# Patient Record
Sex: Female | Born: 1940 | Race: Black or African American | Hispanic: No | Smoking: Never smoker
Health system: Southern US, Community
[De-identification: ages and names within clinical notes are randomized; demographics above are authoritative.]

## PROBLEM LIST (undated history)

## (undated) DIAGNOSIS — D649 Anemia, unspecified: Secondary | ICD-10-CM

## (undated) DIAGNOSIS — I10 Essential (primary) hypertension: Secondary | ICD-10-CM

## (undated) DIAGNOSIS — E785 Hyperlipidemia, unspecified: Secondary | ICD-10-CM

## (undated) DIAGNOSIS — J189 Pneumonia, unspecified organism: Secondary | ICD-10-CM

## (undated) DIAGNOSIS — N289 Disorder of kidney and ureter, unspecified: Secondary | ICD-10-CM

## (undated) DIAGNOSIS — I509 Heart failure, unspecified: Secondary | ICD-10-CM

## (undated) DIAGNOSIS — E039 Hypothyroidism, unspecified: Secondary | ICD-10-CM

## (undated) DIAGNOSIS — F32A Depression, unspecified: Secondary | ICD-10-CM

## (undated) DIAGNOSIS — I2510 Atherosclerotic heart disease of native coronary artery without angina pectoris: Secondary | ICD-10-CM

## (undated) DIAGNOSIS — I4891 Unspecified atrial fibrillation: Secondary | ICD-10-CM

## (undated) DIAGNOSIS — I639 Cerebral infarction, unspecified: Secondary | ICD-10-CM

## (undated) DIAGNOSIS — K219 Gastro-esophageal reflux disease without esophagitis: Secondary | ICD-10-CM

## (undated) DIAGNOSIS — F329 Major depressive disorder, single episode, unspecified: Secondary | ICD-10-CM

## (undated) HISTORY — PX: ABDOMINAL HYSTERECTOMY: SHX81

## (undated) HISTORY — DX: Unspecified atrial fibrillation: I48.91

---

## 2004-08-11 ENCOUNTER — Observation Stay (HOSPITAL_COMMUNITY): Admission: EM | Admit: 2004-08-11 | Discharge: 2004-08-12 | Payer: Self-pay | Admitting: *Deleted

## 2004-08-23 ENCOUNTER — Ambulatory Visit (HOSPITAL_COMMUNITY): Admission: RE | Admit: 2004-08-23 | Discharge: 2004-08-23 | Payer: Self-pay | Admitting: Cardiology

## 2010-12-15 ENCOUNTER — Encounter: Payer: Self-pay | Admitting: *Deleted

## 2010-12-15 ENCOUNTER — Inpatient Hospital Stay (HOSPITAL_COMMUNITY)
Admission: EM | Admit: 2010-12-15 | Discharge: 2010-12-18 | DRG: 194 | Disposition: A | Discharge status: Home / Self Care | Payer: Medicare Other | Attending: Internal Medicine | Admitting: Internal Medicine

## 2010-12-15 ENCOUNTER — Emergency Department (HOSPITAL_COMMUNITY): Payer: Medicare Other

## 2010-12-15 DIAGNOSIS — J189 Pneumonia, unspecified organism: Secondary | ICD-10-CM

## 2010-12-15 DIAGNOSIS — E039 Hypothyroidism, unspecified: Secondary | ICD-10-CM

## 2010-12-15 DIAGNOSIS — R0902 Hypoxemia: Secondary | ICD-10-CM

## 2010-12-15 DIAGNOSIS — I10 Essential (primary) hypertension: Secondary | ICD-10-CM | POA: Diagnosis present

## 2010-12-15 DIAGNOSIS — N179 Acute kidney failure, unspecified: Secondary | ICD-10-CM | POA: Diagnosis present

## 2010-12-15 DIAGNOSIS — D649 Anemia, unspecified: Secondary | ICD-10-CM | POA: Diagnosis present

## 2010-12-15 DIAGNOSIS — E1121 Type 2 diabetes mellitus with diabetic nephropathy: Secondary | ICD-10-CM | POA: Diagnosis present

## 2010-12-15 DIAGNOSIS — E119 Type 2 diabetes mellitus without complications: Secondary | ICD-10-CM

## 2010-12-15 DIAGNOSIS — E785 Hyperlipidemia, unspecified: Secondary | ICD-10-CM

## 2010-12-15 DIAGNOSIS — J159 Unspecified bacterial pneumonia: Secondary | ICD-10-CM

## 2010-12-15 DIAGNOSIS — R82998 Other abnormal findings in urine: Secondary | ICD-10-CM | POA: Diagnosis present

## 2010-12-15 HISTORY — DX: Essential (primary) hypertension: I10

## 2010-12-15 HISTORY — DX: Hyperlipidemia, unspecified: E78.5

## 2010-12-15 HISTORY — DX: Depression, unspecified: F32.A

## 2010-12-15 HISTORY — DX: Hypothyroidism, unspecified: E03.9

## 2010-12-15 HISTORY — DX: Anemia, unspecified: D64.9

## 2010-12-15 HISTORY — DX: Major depressive disorder, single episode, unspecified: F32.9

## 2010-12-15 LAB — URINE MICROSCOPIC-ADD ON

## 2010-12-15 LAB — DIFFERENTIAL
Eosinophils Absolute: 0 10*3/uL (ref 0.0–0.7)
Eosinophils Relative: 0 % (ref 0–5)
Lymphocytes Relative: 9 % — ABNORMAL LOW (ref 12–46)
Lymphs Abs: 1.2 10*3/uL (ref 0.7–4.0)
Monocytes Relative: 5 % (ref 3–12)

## 2010-12-15 LAB — GLUCOSE, CAPILLARY
Glucose-Capillary: 187 mg/dL — ABNORMAL HIGH (ref 70–99)
Glucose-Capillary: 292 mg/dL — ABNORMAL HIGH (ref 70–99)

## 2010-12-15 LAB — BASIC METABOLIC PANEL
BUN: 20 mg/dL (ref 6–23)
CO2: 20 mEq/L (ref 19–32)
Calcium: 9.1 mg/dL (ref 8.4–10.5)
Chloride: 104 mEq/L (ref 96–112)
Glucose, Bld: 207 mg/dL — ABNORMAL HIGH (ref 70–99)

## 2010-12-15 LAB — CBC
HCT: 28 % — ABNORMAL LOW (ref 36.0–46.0)
Hemoglobin: 9.2 g/dL — ABNORMAL LOW (ref 12.0–15.0)
MCHC: 32.9 g/dL (ref 30.0–36.0)
MCV: 85.4 fL (ref 78.0–100.0)
Platelets: 306 10*3/uL (ref 150–400)
RBC: 3.28 MIL/uL — ABNORMAL LOW (ref 3.87–5.11)
RDW: 18.5 % — ABNORMAL HIGH (ref 11.5–15.5)

## 2010-12-15 LAB — URINALYSIS, ROUTINE W REFLEX MICROSCOPIC
Bilirubin Urine: NEGATIVE
Glucose, UA: NEGATIVE mg/dL
Ketones, ur: NEGATIVE mg/dL
Protein, ur: 100 mg/dL — AB
pH: 6 (ref 5.0–8.0)

## 2010-12-15 MED ORDER — ALBUTEROL SULFATE (5 MG/ML) 0.5% IN NEBU
5.0000 mg | INHALATION_SOLUTION | Freq: Once | RESPIRATORY_TRACT | Status: AC
  Administered 2010-12-15: 5 mg via RESPIRATORY_TRACT
  Filled 2010-12-15: qty 1

## 2010-12-15 MED ORDER — DEXTROSE 5 % IV SOLN
1.0000 g | INTRAVENOUS | Status: DC
  Administered 2010-12-16 – 2010-12-17 (×2): 1 g via INTRAVENOUS
  Filled 2010-12-15 (×5): qty 10

## 2010-12-15 MED ORDER — DIPHENHYDRAMINE HCL 12.5 MG/5ML PO ELIX
12.5000 mg | ORAL_SOLUTION | Freq: Every evening | ORAL | Status: DC | PRN
  Administered 2010-12-15: 12.5 mg via ORAL
  Filled 2010-12-15: qty 5

## 2010-12-15 MED ORDER — CITALOPRAM HYDROBROMIDE 20 MG PO TABS
40.0000 mg | ORAL_TABLET | Freq: Every day | ORAL | Status: DC
  Administered 2010-12-16 – 2010-12-18 (×3): 40 mg via ORAL
  Filled 2010-12-15 (×4): qty 2

## 2010-12-15 MED ORDER — METHYLPREDNISOLONE SODIUM SUCC 125 MG IJ SOLR
125.0000 mg | Freq: Once | INTRAMUSCULAR | Status: AC
  Administered 2010-12-15: 125 mg via INTRAVENOUS
  Filled 2010-12-15: qty 2

## 2010-12-15 MED ORDER — SIMVASTATIN 20 MG PO TABS
40.0000 mg | ORAL_TABLET | Freq: Every day | ORAL | Status: DC
  Filled 2010-12-15: qty 1

## 2010-12-15 MED ORDER — ALBUTEROL SULFATE (5 MG/ML) 0.5% IN NEBU: 2.5000 mg | INHALATION_SOLUTION | RESPIRATORY_TRACT | Status: DC

## 2010-12-15 MED ORDER — AMLODIPINE BESYLATE 5 MG PO TABS
10.0000 mg | ORAL_TABLET | Freq: Every day | ORAL | Status: DC
  Administered 2010-12-15 – 2010-12-17 (×3): 10 mg via ORAL
  Filled 2010-12-15 (×3): qty 2

## 2010-12-15 MED ORDER — SODIUM CHLORIDE 0.9 % IV SOLN: INTRAVENOUS | Status: DC

## 2010-12-15 MED ORDER — HYDROCODONE-ACETAMINOPHEN 5-325 MG PO TABS
1.0000 | ORAL_TABLET | ORAL | Status: DC | PRN
  Administered 2010-12-16 – 2010-12-17 (×6): 1 via ORAL
  Filled 2010-12-15 (×6): qty 1

## 2010-12-15 MED ORDER — POTASSIUM CHLORIDE IN NACL 20-0.9 MEQ/L-% IV SOLN
INTRAVENOUS | Status: DC
  Administered 2010-12-15: 1000 mL via INTRAVENOUS
  Administered 2010-12-16: 08:00:00 via INTRAVENOUS
  Administered 2010-12-17: 1000 mL via INTRAVENOUS

## 2010-12-15 MED ORDER — DOCUSATE SODIUM 100 MG PO CAPS
100.0000 mg | ORAL_CAPSULE | Freq: Two times a day (BID) | ORAL | Status: DC
  Administered 2010-12-15 – 2010-12-18 (×7): 100 mg via ORAL
  Filled 2010-12-15 (×10): qty 1

## 2010-12-15 MED ORDER — CITALOPRAM HYDROBROMIDE 20 MG PO TABS
40.0000 mg | ORAL_TABLET | Freq: Every day | ORAL | Status: DC
  Filled 2010-12-15 (×2): qty 1

## 2010-12-15 MED ORDER — GUAIFENESIN-DM 100-10 MG/5ML PO SYRP: 5.0000 mL | ORAL_SOLUTION | ORAL | Status: DC | PRN

## 2010-12-15 MED ORDER — DEXTROSE 5 % IV SOLN
1.0000 g | Freq: Once | INTRAVENOUS | Status: AC
  Administered 2010-12-15: 13:00:00 via INTRAVENOUS
  Filled 2010-12-15: qty 10

## 2010-12-15 MED ORDER — INSULIN ASPART 100 UNIT/ML ~~LOC~~ SOLN
0.0000 [IU] | Freq: Three times a day (TID) | SUBCUTANEOUS | Status: DC
  Administered 2010-12-15 – 2010-12-16 (×3): 7 [IU] via SUBCUTANEOUS
  Administered 2010-12-16: 9 [IU] via SUBCUTANEOUS
  Administered 2010-12-17 (×3): 2 [IU] via SUBCUTANEOUS
  Filled 2010-12-15: qty 3

## 2010-12-15 MED ORDER — DEXTROSE 5 % IV SOLN
500.0000 mg | INTRAVENOUS | Status: DC
  Administered 2010-12-16 – 2010-12-17 (×2): 500 mg via INTRAVENOUS
  Filled 2010-12-15 (×3): qty 500

## 2010-12-15 MED ORDER — ONDANSETRON HCL 4 MG PO TABS: 4.0000 mg | ORAL_TABLET | Freq: Four times a day (QID) | ORAL | Status: DC | PRN

## 2010-12-15 MED ORDER — ROSUVASTATIN CALCIUM 20 MG PO TABS
10.0000 mg | ORAL_TABLET | Freq: Every day | ORAL | Status: DC
  Administered 2010-12-16 – 2010-12-17 (×2): 10 mg via ORAL
  Filled 2010-12-15 (×3): qty 1

## 2010-12-15 MED ORDER — LINAGLIPTIN 5 MG PO TABS
5.0000 mg | ORAL_TABLET | Freq: Every day | ORAL | Status: DC
  Administered 2010-12-16 – 2010-12-18 (×3): 5 mg via ORAL
  Filled 2010-12-15 (×5): qty 1

## 2010-12-15 MED ORDER — CARVEDILOL 3.125 MG PO TABS
6.2500 mg | ORAL_TABLET | Freq: Two times a day (BID) | ORAL | Status: DC
  Administered 2010-12-15 – 2010-12-18 (×6): 6.25 mg via ORAL
  Filled 2010-12-15: qty 1
  Filled 2010-12-15 (×3): qty 2
  Filled 2010-12-15: qty 1
  Filled 2010-12-15 (×3): qty 2

## 2010-12-15 MED ORDER — ASPIRIN EC 325 MG PO TBEC
325.0000 mg | DELAYED_RELEASE_TABLET | Freq: Every day | ORAL | Status: DC
  Administered 2010-12-16 – 2010-12-18 (×3): 325 mg via ORAL
  Filled 2010-12-15 (×4): qty 1

## 2010-12-15 MED ORDER — ONDANSETRON HCL 4 MG/2ML IJ SOLN: 4.0000 mg | Freq: Four times a day (QID) | INTRAMUSCULAR | Status: DC | PRN

## 2010-12-15 MED ORDER — BIOTENE DRY MOUTH MT LIQD
15.0000 mL | Freq: Two times a day (BID) | OROMUCOSAL | Status: DC
  Administered 2010-12-15 – 2010-12-18 (×6): 15 mL via OROMUCOSAL

## 2010-12-15 MED ORDER — ALBUTEROL SULFATE (5 MG/ML) 0.5% IN NEBU
2.5000 mg | INHALATION_SOLUTION | RESPIRATORY_TRACT | Status: DC | PRN
  Administered 2010-12-18: 2.5 mg via RESPIRATORY_TRACT

## 2010-12-15 MED ORDER — INSULIN ASPART 100 UNIT/ML ~~LOC~~ SOLN
0.0000 [IU] | Freq: Every day | SUBCUTANEOUS | Status: DC
  Administered 2010-12-15: 3 [IU] via SUBCUTANEOUS
  Administered 2010-12-16: 2 [IU] via SUBCUTANEOUS

## 2010-12-15 MED ORDER — ACETAMINOPHEN 650 MG RE SUPP: 650.0000 mg | Freq: Four times a day (QID) | RECTAL | Status: DC | PRN

## 2010-12-15 MED ORDER — LEVOTHYROXINE SODIUM 88 MCG PO TABS
88.0000 ug | ORAL_TABLET | Freq: Every day | ORAL | Status: DC
  Administered 2010-12-16 – 2010-12-18 (×3): 88 ug via ORAL
  Filled 2010-12-15 (×5): qty 1

## 2010-12-15 MED ORDER — DEXTROSE 5 % IV SOLN
500.0000 mg | Freq: Once | INTRAVENOUS | Status: AC
  Administered 2010-12-15: 500 mg via INTRAVENOUS
  Filled 2010-12-15: qty 500

## 2010-12-15 MED ORDER — ALBUTEROL SULFATE (5 MG/ML) 0.5% IN NEBU
2.5000 mg | INHALATION_SOLUTION | Freq: Four times a day (QID) | RESPIRATORY_TRACT | Status: DC
  Administered 2010-12-15 – 2010-12-17 (×8): 2.5 mg via RESPIRATORY_TRACT
  Filled 2010-12-15 (×10): qty 0.5

## 2010-12-15 MED ORDER — ACETAMINOPHEN 325 MG PO TABS: 650.0000 mg | ORAL_TABLET | Freq: Four times a day (QID) | ORAL | Status: DC | PRN

## 2010-12-15 MED ORDER — SODIUM CHLORIDE 0.9 % IV SOLN
INTRAVENOUS | Status: DC
  Administered 2010-12-15 (×2): via INTRAVENOUS

## 2010-12-15 MED ORDER — INSULIN GLARGINE 100 UNIT/ML ~~LOC~~ SOLN
5.0000 [IU] | Freq: Every day | SUBCUTANEOUS | Status: DC
  Administered 2010-12-15: 5 [IU] via SUBCUTANEOUS
  Filled 2010-12-15: qty 3

## 2010-12-15 MED ORDER — DEXTROSE 5 % IV SOLN: 1.0000 g | INTRAVENOUS | Status: DC

## 2010-12-15 MED ORDER — BENZONATATE 100 MG PO CAPS
100.0000 mg | ORAL_CAPSULE | Freq: Three times a day (TID) | ORAL | Status: DC
  Administered 2010-12-15 – 2010-12-18 (×9): 100 mg via ORAL
  Filled 2010-12-15 (×9): qty 1

## 2010-12-15 MED ORDER — ALUM & MAG HYDROXIDE-SIMETH 200-200-20 MG/5ML PO SUSP: 30.0000 mL | Freq: Four times a day (QID) | ORAL | Status: DC | PRN

## 2010-12-15 NOTE — ED Provider Notes (Addendum)
Scribed for Trisha Mangle, MD, the patient was seen in room APA14/AP[facility]14 . This chart was scribed by Glory Buff.     CSN: [id number] Arrival date & time: 12/15/2010 10:56 AM   First MD Initiated Contact with Patient 12/15/10 1124      Chief Complaint  Patient presents with  . Shortness of Breath    (Consider location/radiation/quality/duration/timing/severity/associated sxs/prior treatment) HPI Beth Padilla is a 70 y.o. female who presents to the Emergency Department complaining of 6 days of sudden onset and gradually worsening shortness of breath with associated coughing and wheezing. Pt. had a  productive cough this morning with blood tinged sputum.  Pt also c/o associated symptoms including soreness in her side, coughing, and wheezing. Pt denies CP or fever. Pt saw PCP yesterday, was Dx with pneumonia and put on levaquin and albuterol. Pt reports abx and inhaler are not improving sx.  Pt. States that she ate yesterday and has already had her flu shot.    Past Medical History  Diagnosis Date  . Diabetes mellitus   . Hypertension   . Thyroid disease     Past Surgical History  Procedure Date  . Abdominal hysterectomy     History reviewed. No pertinent family history.  History  Substance Use Topics  . Smoking status: Never Smoker   . Smokeless tobacco: Not on file  . Alcohol Use: No    Review of Systems 10 Systems reviewed and are negative for acute change except as noted in the HPI.   Allergies  Review of patient's allergies indicates no known allergies.  Home Medications   Current Outpatient Rx  Name Route Sig Dispense Refill  . ALBUTEROL SULFATE HFA 108 (90 BASE) MCG/ACT IN AERS Inhalation Inhale 2 puffs into the lungs every 6 (six) hours as needed. Shortness of breath     . AMLODIPINE BESYLATE 10 MG PO TABS Oral Take 10 mg by mouth daily.      . ASPIRIN EC 325 MG PO TBEC Oral Take 325 mg by mouth daily.      Marland Kitchen CARVEDILOL 6.25 MG PO TABS Oral Take 6.25 mg  by mouth 2 (two) times daily with a meal.      . CITALOPRAM HYDROBROMIDE 40 MG PO TABS Oral Take 40 mg by mouth daily.      Marland Kitchen GLIPIZIDE 5 MG PO TABS Oral Take 5 mg by mouth 2 (two) times daily before a meal.      . LEVOFLOXACIN 750 MG PO TABS Oral Take 750 mg by mouth daily.      Marland Kitchen LEVOTHYROXINE SODIUM 88 MCG PO TABS Oral Take 88 mcg by mouth daily.      Marland Kitchen LISINOPRIL-HYDROCHLOROTHIAZIDE 20-12.5 MG PO TABS Oral Take 1 tablet by mouth daily.      Marland Kitchen SIMVASTATIN 40 MG PO TABS Oral Take 40 mg by mouth at bedtime.      Marland Kitchen SITAGLIPTIN PHOSPHATE 100 MG PO TABS Oral Take 100 mg by mouth daily.        BP 146/57  Pulse 70  Temp(Src) 98.7 F (37.1 C) (Oral)  Resp 22  Ht 5\' 8"  (1.727 m)  Wt 219 lb (99.338 kg)  BMI 33.30 kg/m2  SpO2 91%  Physical Exam  Nursing note and vitals reviewed. Constitutional: She is oriented to person, place, and time. She appears well-developed and well-nourished.       Appears fatigue  HENT:  Head: Normocephalic and atraumatic.  Eyes: Right eye exhibits no discharge. Left eye exhibits no  discharge.  Neck: Normal range of motion. Neck supple.  Cardiovascular: Normal rate and regular rhythm.   Pulmonary/Chest: She has wheezes. She has rales (right base ).  Abdominal: Soft. There is no tenderness.  Musculoskeletal: Normal range of motion. She exhibits no edema.  Neurological: She is alert and oriented to person, place, and time.  Skin: Skin is warm and dry.  Psychiatric: She has a normal mood and affect. Her behavior is normal.    ED Course  Procedures (including critical care time)   Labs Reviewed  GLUCOSE, CAPILLARY - Abnormal; Notable for the following:    Glucose-Capillary 187 (*)    All other components within normal limits  CBC - Abnormal; Notable for the following:    WBC 12.6 (*)    RBC 3.28 (*)    Hemoglobin 9.2 (*)    HCT 28.0 (*)    RDW 18.5 (*)    All other components within normal limits  DIFFERENTIAL - Abnormal; Notable for the following:      Neutrophils Relative 86 (*)    Neutro Abs 10.8 (*)    Lymphocytes Relative 9 (*)    All other components within normal limits  BASIC METABOLIC PANEL - Abnormal; Notable for the following:    Glucose, Bld 207 (*)    Creatinine, Ser 1.23 (*)    GFR calc non Af Amer 43 (*)    GFR calc Af Amer 50 (*)    All other components within normal limits  POCT CBG MONITORING  URINALYSIS, ROUTINE W REFLEX MICROSCOPIC  CULTURE, BLOOD (ROUTINE X 2)  CULTURE, BLOOD (ROUTINE X 2)   Dg Chest 2 View  12/15/2010  *RADIOLOGY REPORT*  Clinical Data: Shortness of breath, fever.  CHEST - 2 VIEW  Comparison: None.  Findings: Consolidation is seen in the right lower lobe compatible with pneumonia.  Patchy left lower lobe atelectasis or infiltrate. Heart is borderline in size.  No effusions.  No acute bony abnormality.  IMPRESSION: Dense right lower lobe pneumonia.  Left basilar atelectasis or infiltrate.  Original Report Authenticated By: Raelyn Number, M.D.     ED Medications:  Medications  0.9 %  sodium chloride infusion   methylPREDNISolone sodium succinate (SOLU-MEDROL) 125 MG injection 125 mg   albuterol (PROVENTIL) (5 MG/ML) 0.5% nebulizer solution 5 mg   azithromycin (ZITHROMAX) 500 mg in dextrose 5 % 250 mL IVPB   cefTRIAXone (ROCEPHIN) 1 g in dextrose 5 % 50 mL IVPB   DIAGNOSTIC STUDIES: Oxygen Saturation is 91% on room air, adequate by my interpretation.    COORDINATION OF CARE:  11:33 a.m. EDP at bedside discusses breathing treatment plan and necessity of chest x-ray. 1:13 PM EDP consult with hospitalist Dr. Caryn Section. Will admit.   No diagnosis found.   MDM  Patient with new oxygen requirement even after albuterol treatments. Administered steroids as well. Chest x-ray shows right lower lobe pneumonia. Given the persistent hypoxia she requires admission to the hospital. I placed on Rocephin and Zithromax. Case discussed with hospitalist who accepted the patient for admission.  Blood cx were  obtained  I personally performed the services described in this documentation, which was scribed in my presence. The recorded information has been reviewed and considered.        Trisha Mangle, MD 12/15/10 1319  Trisha Mangle, MD 12/15/10 [phone]

## 2010-12-15 NOTE — H&P (Signed)
Beth Padilla MRN: [id number] DOB/AGE: 1940/04/27 70 y.o. Primary Care Physician:V. Almira Bar, MD ([name], [place]) [place] date: 12/15/2010 Chief Complaint: Wheezing and chest congestion. HPI: The patient is a 70 year old woman with a past medical history significant for hypertension and type 2 diabetes mellitus, who presents to the emergency department today with a chief complaint of wheezing and chest congestion. Her symptoms started approximately one week ago. It started with some chest congestion and it has progressed to wheezing and shortness of breath. She has not been able to sleep at night or get any rest due to wheezing and a nonproductive cough. She has had chills but no fever. She has had nausea but no vomiting. She has had no pleurisy or chest pain. She has had no recent nasal congestion, sore throat, abdominal pain, diarrhea, or pain with urination. She feels pressure in both of her ears when she coughs. She saw her primary care physician yesterday for her symptoms. An outpatient chest x-ray was ordered and it revealed pneumonia. She was prescribed an albuterol inhaler and Levaquin. She used her albuterol inhaler several times yesterday and took Levaquin as prescribed. She decided to come to the emergency department today because her symptoms did not subside.  In the emergency department, the patient is noted to be afebrile and hemodynamically stable. She is oxygenating between 90 and 91% on room air. Her lab data are significant for a creatinine of 1.23, glucose of 207, WBC of 12.6, and hemoglobin of 9.2. Her chest x-ray reveals a dense right lower lobe pneumonia. She is being admitted for further evaluation and management.  Past Medical History  Diagnosis Date  . Diabetes mellitus   . Hypertension   . Hypothyroidism   . Hyperlipidemia   . Anemia   . Depression     Past Surgical History  Procedure Date  . Abdominal hysterectomy     Prior to Admission medications     Medication Sig Start Date End Date Taking? Authorizing Provider  albuterol (PROVENTIL HFA;VENTOLIN HFA) 108 (90 BASE) MCG/ACT inhaler Inhale 2 puffs into the lungs every 6 (six) hours as needed. Shortness of breath    Yes Historical Provider, MD  amLODipine (NORVASC) 10 MG tablet Take 10 mg by mouth daily.     Yes Historical Provider, MD  aspirin EC 325 MG tablet Take 325 mg by mouth daily.     Yes Historical Provider, MD  carvedilol (COREG) 6.25 MG tablet Take 6.25 mg by mouth 2 (two) times daily with a meal.     Yes Historical Provider, MD  citalopram (CELEXA) 40 MG tablet Take 40 mg by mouth daily.     Yes Historical Provider, MD  glipiZIDE (GLUCOTROL) 5 MG tablet Take 5 mg by mouth 2 (two) times daily before a meal.     Yes Historical Provider, MD  levofloxacin (LEVAQUIN) 750 MG tablet Take 750 mg by mouth daily.     Yes Historical Provider, MD  levothyroxine (SYNTHROID, LEVOTHROID) 88 MCG tablet Take 88 mcg by mouth daily.     Yes Historical Provider, MD  lisinopril-hydrochlorothiazide (PRINZIDE,ZESTORETIC) 20-12.5 MG per tablet Take 1 tablet by mouth daily.     Yes Historical Provider, MD  simvastatin (ZOCOR) 40 MG tablet Take 40 mg by mouth at bedtime.     Yes Historical Provider, MD  sitaGLIPtin (JANUVIA) 100 MG tablet Take 100 mg by mouth daily.     Yes Historical Provider, MD    Allergies: No Known Allergies  Family history: Her mother died  of a stroke. Her father died of some type of cancer.   Social History: She is widowed. She has 5 children. She is retired. She lives in [place]. She denies tobacco, alcohol, and illicit drug use.    ROS: As above in the history of present illness, otherwise negative.   PHYSICAL EXAM: Blood pressure 163/44, pulse 84, temperature 98.9 F (37.2 C), temperature source Oral, resp. rate 22, height 5\' 8"  (1.727 m), weight 99.338 kg (219 lb), SpO2 95.00%.  General: The patient is a pleasant 70 year old after American woman who is  currently laying in bed, in no acute distress. She does appear uncomfortable however. HEENT: Head is normocephalic, nontraumatic. Pupils are equal, round, and reactive to light. Extraocular movements are intact. Conjunctivae are clear. Sclerae are white. Tympanic membranes are clear bilaterally. No evidence of erythema or drainage from the ears. Oropharynx reveals mildly dry mucous membranes. Multiple missing teeth and generally poor dentition. No posterior exudates or erythema. Neck: Supple without adenopathy thyromegaly or JVD. No bruit. Lungs: Occasional wheezes auscultated bilaterally and few wheezes auscultated in the right lower base. Breathing is nonlabored. Heart: Distant S1, S2, no murmurs rubs or gallops. Abdomen: Obese, positive bowel sounds, soft, nontender, nondistended. Extremities: Pedal pulses are palpable bilaterally. No pretibial edema and no pedal edema. Neurologic: Cranial nerves II through XII are intact. Strength is 5 over 5 throughout. Sensation is intact. Psychological: The patient is alert and oriented x3. Her affect is pleasant. Her speech is clear. She is Armed forces training and education officer.  Basic Metabolic Panel:  Basename 12/15/10 1133  NA 136  K 3.9  CL 104  CO2 20  GLUCOSE 207*  BUN 20  CREATININE 1.23*  CALCIUM 9.1  MG --  PHOS --   Liver Function Tests: No results found for this basename: AST:2,ALT:2,ALKPHOS:2,BILITOT:2,PROT:2,ALBUMIN:2 in the last 72 hours No results found for this basename: LIPASE:2,AMYLASE:2 in the last 72 hours No results found for this basename: AMMONIA:2 in the last 72 hours CBC:  Basename 12/15/10 1133  WBC 12.6*  NEUTROABS 10.8*  HGB 9.2*  HCT 28.0*  MCV 85.4  PLT 306   Cardiac Enzymes: No results found for this basename: CKTOTAL:3,CKMB:3,CKMBINDEX:3,TROPONINI:3 in the last 72 hours BNP: No results found for this basename: POCBNP:3 in the last 72 hours D-Dimer: No results found for this basename: DDIMER:2 in the last 72  hours CBG:  Basename 12/15/10 1126  GLUCAP 187*   Hemoglobin A1C: No results found for this basename: HGBA1C in the last 72 hours Fasting Lipid Panel: No results found for this basename: CHOL,HDL,LDLCALC,TRIG,CHOLHDL,LDLDIRECT in the last 72 hours Thyroid Function Tests: No results found for this basename: TSH,T4TOTAL,FREET4,T3FREE,THYROIDAB in the last 72 hours Anemia Panel: No results found for this basename: VITAMINB12,FOLATE,FERRITIN,TIBC,IRON,RETICCTPCT in the last 72 hours Coagulation: No results found for this basename: LABPROT:2,INR:2 in the last 72 hours Urine Drug Screen: Drugs of Abuse  No results found for this basename: labopia, cocainscrnur, labbenz, amphetmu, thcu, labbarb    Alcohol Level: No results found for this basename: ETH:2 in the last 72 hours Urinalysis:  Misc. Labs:  EKG:  No results found for this or any previous visit (from the past 240 hour(s)).   Results for orders placed during the hospital encounter of 12/15/10 (from the past 48 hour(s))  GLUCOSE, CAPILLARY     Status: Abnormal   Collection Time   12/15/10 11:26 AM      Component Value Range Comment   Glucose-Capillary 187 (*) 70 - 99 (mg/dL)   CBC  Status: Abnormal   Collection Time   12/15/10 11:33 AM      Component Value Range Comment   WBC 12.6 (*) 4.0 - 10.5 (K/uL)    RBC 3.28 (*) 3.87 - 5.11 (MIL/uL)    Hemoglobin 9.2 (*) 12.0 - 15.0 (g/dL)    HCT 28.0 (*) 36.0 - 46.0 (%)    MCV 85.4  78.0 - 100.0 (fL)    MCH 28.0  26.0 - 34.0 (pg)    MCHC 32.9  30.0 - 36.0 (g/dL)    RDW 18.5 (*) 11.5 - 15.5 (%)    Platelets 306  150 - 400 (K/uL)   DIFFERENTIAL     Status: Abnormal   Collection Time   12/15/10 11:33 AM      Component Value Range Comment   Neutrophils Relative 86 (*) 43 - 77 (%)    Neutro Abs 10.8 (*) 1.7 - 7.7 (K/uL)    Lymphocytes Relative 9 (*) 12 - 46 (%)    Lymphs Abs 1.2  0.7 - 4.0 (K/uL)    Monocytes Relative 5  3 - 12 (%)    Monocytes Absolute 0.6  0.1 - 1.0  (K/uL)    Eosinophils Relative 0  0 - 5 (%)    Eosinophils Absolute 0.0  0.0 - 0.7 (K/uL)    Basophils Relative 0  0 - 1 (%)    Basophils Absolute 0.0  0.0 - 0.1 (K/uL)   BASIC METABOLIC PANEL     Status: Abnormal   Collection Time   12/15/10 11:33 AM      Component Value Range Comment   Sodium 136  135 - 145 (mEq/L)    Potassium 3.9  3.5 - 5.1 (mEq/L)    Chloride 104  96 - 112 (mEq/L)    CO2 20  19 - 32 (mEq/L)    Glucose, Bld 207 (*) 70 - 99 (mg/dL)    BUN 20  6 - 23 (mg/dL)    Creatinine, Ser 1.23 (*) 0.50 - 1.10 (mg/dL)    Calcium 9.1  8.4 - 10.5 (mg/dL)    GFR calc non Af Amer 43 (*) >90 (mL/min)    GFR calc Af Amer 50 (*) >90 (mL/min)   URINALYSIS, ROUTINE W REFLEX MICROSCOPIC     Status: Abnormal   Collection Time   12/15/10 12:55 PM      Component Value Range Comment   Color, Urine STRAW (*) YELLOW     APPearance CLEAR  CLEAR     Specific Gravity, Urine 1.020  1.005 - 1.030     pH 6.0  5.0 - 8.0     Glucose, UA NEGATIVE  NEGATIVE (mg/dL)    Hgb urine dipstick TRACE (*) NEGATIVE     Bilirubin Urine NEGATIVE  NEGATIVE     Ketones, ur NEGATIVE  NEGATIVE (mg/dL)    Protein, ur 100 (*) NEGATIVE (mg/dL)    Urobilinogen, UA 0.2  0.0 - 1.0 (mg/dL)    Nitrite NEGATIVE  NEGATIVE     Leukocytes, UA TRACE (*) NEGATIVE    URINE MICROSCOPIC-ADD ON     Status: Abnormal   Collection Time   12/15/10 12:55 PM      Component Value Range Comment   Squamous Epithelial / LPF FEW (*) RARE     WBC, UA 3-6  <3 (WBC/hpf)    RBC / HPF 0-2  <3 (RBC/hpf)    Casts HYALINE CASTS (*) NEGATIVE      Dg Chest 2 View  12/15/2010  *  RADIOLOGY REPORT*  Clinical Data: Shortness of breath, fever.  CHEST - 2 VIEW  Comparison: None.  Findings: Consolidation is seen in the right lower lobe compatible with pneumonia.  Patchy left lower lobe atelectasis or infiltrate. Heart is borderline in size.  No effusions.  No acute bony abnormality.  IMPRESSION: Dense right lower lobe pneumonia.  Left basilar  atelectasis or infiltrate.  Original Report Authenticated By: Raelyn Number, M.D.    Impression:  Principal Problem:  *PNA (pneumonia) Active Problems:  DM type 2 (diabetes mellitus, type 2)  HTN (hypertension)  Anemia  ARF (acute renal failure)  Hypothyroidism  Hyperlipidemia   1.Right lower lobe community-acquired pneumonia. She has only a few bronchospasms on exam and she may have superimposed bronchitis. Her primary care physician started on Proair inhaler and Levaquin for which she took for only one day  Acute renal failure, likely prerenal azotemia in the setting of ACE inhibitor and diuretic therapy.  Hypertension. She is treated chronically with Coreg, Norvasc, and lisinopril/HCTZ.  Type 2 diabetes mellitus. She is treated chronically with glipizide and Januvia.  Hypothyroidism. She is on Synthroid.  Hyperlipidemia. She is on Zocor.     Plan: The patient was given Rocephin and azithromycin in the emergency department. We will continue both. We will also continue bronchodilator therapy with albuterol.  We'll start gentle IV fluids. She received approximately 1 L of IV fluids in the emergency department. We'll hold lisinopril and HCTZ until she is hydrated and her renal function has improved. We'll continue Coreg with mild bronchospasms noted. If her bronchospasms worsen, will consider holding the beta blocker.  Continued Januvia but hold glipizide and start sliding scale NovoLog for treatment of diabetes.  Supportive treatment.  We'll check her TSH, free T4, and hemoglobin A1c.      Schuyler Behan 12/15/2010, 2:58 PM

## 2010-12-15 NOTE — ED Notes (Signed)
Pt c/o shortness of breath, coughing and wheezing since last Saturday. Pt states that she has coughed up a little blood this am. Pt seen by her PCP yesterday and told that she had Pneumonia. Pt placed on antibiotics and an inhaler but she feels like she is getting worse.

## 2010-12-16 LAB — VITAMIN B12: Vitamin B-12: 433 pg/mL (ref 211–911)

## 2010-12-16 LAB — CBC
HCT: 26.3 % — ABNORMAL LOW (ref 36.0–46.0)
Platelets: 301 10*3/uL (ref 150–400)
RDW: 18.6 % — ABNORMAL HIGH (ref 11.5–15.5)
WBC: 13.7 10*3/uL — ABNORMAL HIGH (ref 4.0–10.5)

## 2010-12-16 LAB — BASIC METABOLIC PANEL
Chloride: 104 mEq/L (ref 96–112)
Creatinine, Ser: 1.16 mg/dL — ABNORMAL HIGH (ref 0.50–1.10)
GFR calc Af Amer: 54 mL/min — ABNORMAL LOW (ref >90)
GFR calc non Af Amer: 46 mL/min — ABNORMAL LOW (ref >90)
Potassium: 4 mEq/L (ref 3.5–5.1)

## 2010-12-16 LAB — IRON AND TIBC: UIBC: 304 ug/dL (ref 125–400)

## 2010-12-16 LAB — HEMOGLOBIN A1C: Hgb A1c MFr Bld: 6.5 % — ABNORMAL HIGH (ref <5.7)

## 2010-12-16 LAB — GLUCOSE, CAPILLARY
Glucose-Capillary: 304 mg/dL — ABNORMAL HIGH (ref 70–99)
Glucose-Capillary: 321 mg/dL — ABNORMAL HIGH (ref 70–99)

## 2010-12-16 LAB — T4, FREE: Free T4: 0.98 ng/dL (ref 0.80–1.80)

## 2010-12-16 LAB — RETICULOCYTES
RBC.: 3.11 MIL/uL — ABNORMAL LOW (ref 3.87–5.11)
Retic Count, Absolute: 68.4 10*3/uL (ref 19.0–186.0)
Retic Ct Pct: 2.2 % (ref 0.4–3.1)

## 2010-12-16 LAB — FOLATE: Folate: 11 ng/mL

## 2010-12-16 MED ORDER — LISINOPRIL 10 MG PO TABS
20.0000 mg | ORAL_TABLET | Freq: Every day | ORAL | Status: DC
  Administered 2010-12-16 – 2010-12-17 (×2): 20 mg via ORAL
  Filled 2010-12-16 (×2): qty 2

## 2010-12-16 MED ORDER — INSULIN GLARGINE 100 UNIT/ML ~~LOC~~ SOLN
10.0000 [IU] | Freq: Every day | SUBCUTANEOUS | Status: DC
  Administered 2010-12-16 – 2010-12-17 (×2): 10 [IU] via SUBCUTANEOUS

## 2010-12-16 MED ORDER — GLIPIZIDE 5 MG PO TABS
5.0000 mg | ORAL_TABLET | Freq: Two times a day (BID) | ORAL | Status: DC
  Administered 2010-12-16 – 2010-12-18 (×5): 5 mg via ORAL
  Filled 2010-12-16 (×5): qty 1

## 2010-12-16 NOTE — Progress Notes (Signed)
Subjective: The patient says she is breathing a little bit better.  Objective: Vital signs in last 24 hours: Filed Vitals:   12/16/10 0133 12/16/10 0512 12/16/10 0740 12/16/10 1430  BP:  190/71  174/73  Pulse:  82 86 69  Temp:  98.3 F (36.8 C)  98 F (36.7 C)  TempSrc:  Oral    Resp:  16 18 18   Height:      Weight:      SpO2: 93% 97% 96% 93%    Intake/Output Summary (Last 24 hours) at 12/16/10 1448 Last data filed at 12/16/10 1200  Gross per 24 hour  Intake    680 ml  Output   1026 ml  Net   -346 ml    Weight change:   Physical exam: Lungs: only occasional crackles. Heart: S1, S2, with a soft systolic murmur. Abdomen: Positive bowel sounds, soft, nontender, nondistended. Extremities: No pedal edema.  Lab Results: Basic Metabolic Panel:  Basename 12/16/10 0519 12/15/10 1133  NA 134* 136  K 4.0 3.9  CL 104 104  CO2 18* 20  GLUCOSE 350* 207*  BUN 20 20  CREATININE 1.16* 1.23*  CALCIUM 8.8 9.1  MG -- --  PHOS -- --   Liver Function Tests: No results found for this basename: AST:2,ALT:2,ALKPHOS:2,BILITOT:2,PROT:2,ALBUMIN:2 in the last 72 hours No results found for this basename: LIPASE:2,AMYLASE:2 in the last 72 hours No results found for this basename: AMMONIA:2 in the last 72 hours CBC:  Basename 12/16/10 0519 12/15/10 1133  WBC 13.7* 12.6*  NEUTROABS -- 10.8*  HGB 8.8* 9.2*  HCT 26.3* 28.0*  MCV 84.6 85.4  PLT 301 306   Cardiac Enzymes: No results found for this basename: CKTOTAL:3,CKMB:3,CKMBINDEX:3,TROPONINI:3 in the last 72 hours BNP: No results found for this basename: POCBNP:3 in the last 72 hours D-Dimer: No results found for this basename: DDIMER:2 in the last 72 hours CBG:  Basename 12/16/10 1102 12/16/10 0714 12/15/10 2033 12/15/10 1641 12/15/10 1126  GLUCAP 352* 304* 292* 313* 187*   Hemoglobin A1C: No results found for this basename: HGBA1C in the last 72 hours Fasting Lipid Panel: No results found for this basename:  CHOL,HDL,LDLCALC,TRIG,CHOLHDL,LDLDIRECT in the last 72 hours Thyroid Function Tests: No results found for this basename: TSH,T4TOTAL,FREET4,T3FREE,THYROIDAB in the last 72 hours Anemia Panel:  Basename 12/16/10 0519  VITAMINB12 --  FOLATE --  FERRITIN --  TIBC --  IRON --  RETICCTPCT 2.2   Coagulation: No results found for this basename: LABPROT:2,INR:2 in the last 72 hours Urine Drug Screen: Drugs of Abuse  No results found for this basename: labopia, cocainscrnur, labbenz, amphetmu, thcu, labbarb    Alcohol Level: No results found for this basename: ETH:2 in the last 72 hours   Micro: No results found for this or any previous visit (from the past 240 hour(s)).  Studies/Results: Dg Chest 2 View  12/15/2010  *RADIOLOGY REPORT*  Clinical Data: Shortness of breath, fever.  CHEST - 2 VIEW  Comparison: None.  Findings: Consolidation is seen in the right lower lobe compatible with pneumonia.  Patchy left lower lobe atelectasis or infiltrate. Heart is borderline in size.  No effusions.  No acute bony abnormality.  IMPRESSION: Dense right lower lobe pneumonia.  Left basilar atelectasis or infiltrate.  Original Report Authenticated By: Raelyn Number, M.D.    Medications: I have reviewed the patient's current medications.  Assessment: Principal Problem:  *PNA (pneumonia) Active Problems:  DM type 2 (diabetes mellitus, type 2)  HTN (hypertension)  Anemia  ARF (  acute renal failure)  Hypothyroidism  Hyperlipidemia  Pyuria  1. Community-acquired pneumonia and possible superimposed acute bronchitis. She is currently on Rocephin and azithromycin. Bronchodilator therapy with albuterol has been ordered. Apparently, she was given 125 mg of Solu-Medrol in the emergency department. No further steroid therapy is needed at this point. Her lungs are actually clearer.  Type 2 diabetes mellitus with uncontrolled capillary blood glucose. This may be exacerbated by the steroids that she  received in the emergency department. It is likely that glipizide will need to be restarted.  Malignant hypertension. Initially, the lisinopril was withheld secondary to acute renal insufficiency. However in light of her increasing blood pressure, I will restart lisinopril but will hold the hydrochlorothiazide. Continue Coreg and Norvasc as previously prescribed.  Acute renal failure. This is likely secondary to prerenal azotemia in the setting of pneumonia. She is on gentle IV fluids. The IV fluids however may be contributing to her elevated blood pressures. Next  Hypothyroidism. Stable on Synthroid. TSH is pending.  Pyuria. The patient has no complaints of dysuria.  Anemia. An anemia panel has been ordered.  Plan:  Continue supportive treatment antibiotics and gentle IV fluids. We'll decrease the rate of the IV fluids. Restart lisinopril and glipizide. We'll add Lantus to sliding scale NovoLog. Check hemoglobin A1c, pending. Check the results of TSH and free T4 pending. Possible discharge home Sunday    LOS: 1 day   Beth Padilla 12/16/2010, 2:48 PM

## 2010-12-16 NOTE — Progress Notes (Signed)
UR Chart Review Completed  

## 2010-12-17 LAB — BASIC METABOLIC PANEL
CO2: 22 mEq/L (ref 19–32)
Calcium: 8.7 mg/dL (ref 8.4–10.5)
Creatinine, Ser: 1.19 mg/dL — ABNORMAL HIGH (ref 0.50–1.10)
GFR calc Af Amer: 52 mL/min — ABNORMAL LOW (ref >90)
GFR calc non Af Amer: 45 mL/min — ABNORMAL LOW (ref >90)
Sodium: 138 mEq/L (ref 135–145)

## 2010-12-17 LAB — CBC
MCH: 28.1 pg (ref 26.0–34.0)
MCHC: 32.6 g/dL (ref 30.0–36.0)
MCV: 86.1 fL (ref 78.0–100.0)
Platelets: 341 10*3/uL (ref 150–400)
RDW: 18.7 % — ABNORMAL HIGH (ref 11.5–15.5)

## 2010-12-17 LAB — GLUCOSE, CAPILLARY
Glucose-Capillary: 155 mg/dL — ABNORMAL HIGH (ref 70–99)
Glucose-Capillary: 178 mg/dL — ABNORMAL HIGH (ref 70–99)

## 2010-12-17 MED ORDER — LISINOPRIL 10 MG PO TABS: 40.0000 mg | ORAL_TABLET | Freq: Every day | ORAL | Status: DC

## 2010-12-17 NOTE — Progress Notes (Signed)
Subjective: breathing a little bit better, no acute issues overnight.  Objective: Vital signs in last 24 hours: Filed Vitals:   12/16/10 2044 12/16/10 2307 12/17/10 0505 12/17/10 0821  BP: 178/75  150/71   Pulse: 63  58 68  Temp: 98.5 F (36.9 C)  98.4 F (36.9 C)   TempSrc: Oral  Oral   Resp: 24  16 18   Height:      Weight:      SpO2: 96% 94% 95% 94%   No intake or output data in the 24 hours ending 12/17/10 1209  Weight change:   Physical exam: Gen: Alert and awake, NAD Lungs: only occasional crackles. CVS: S1, S2, with a soft systolic murmur. Abdomen: Positive bowel sounds, soft, nontender, nondistended. Extremities: No pedal edema.  Lab Results: Basic Metabolic Panel:  Basename 12/17/10 0640 12/16/10 0519  NA 138 134*  K 4.8 4.0  CL 108 104  CO2 22 18*  GLUCOSE 130* 350*  BUN 22 20  CREATININE 1.19* 1.16*  CALCIUM 8.7 8.8  MG -- --  PHOS -- --   CBC:  Basename 12/17/10 0640 12/16/10 0519 12/15/10 1133  WBC 17.1* 13.7* --  NEUTROABS -- -- 10.8*  HGB 8.7* 8.8* --  HCT 26.7* 26.3* --  MCV 86.1 84.6 --  PLT 341 301 --   CBG:  Basename 12/17/10 1147 12/17/10 0728 12/16/10 2041 12/16/10 1650 12/16/10 1102 12/16/10 0714  GLUCAP 155* 155* 231* 321* 352* 304*   Hemoglobin A1C:  Basename 12/16/10 0519  HGBA1C 6.5*   Fasting Lipid Panel: No results found for this basename: CHOL,HDL,LDLCALC,TRIG,CHOLHDL,LDLDIRECT in the last 72 hours Thyroid Function Tests:  Basename 12/16/10 0519  TSH 1.285  T4TOTAL --  FREET4 0.98  T3FREE --  THYROIDAB --   Anemia Panel:  Basename 12/16/10 0519  VITAMINB12 433  FOLATE 11.0  FERRITIN 96  TIBC 317  IRON 13*  RETICCTPCT 2.2    Micro: Recent Results (from the past 240 hour(s))  CULTURE, BLOOD (ROUTINE X 2)     Status: Normal (Preliminary result)   Collection Time   12/15/10 11:33 AM      Component Value Range Status Comment   Specimen Description BLOOD RIGHT ARM   Final    Special Requests BOTTLES  DRAWN AEROBIC AND ANAEROBIC 6CC   Final    Culture NO GROWTH 2 DAYS   Final    Report Status PENDING   Incomplete   CULTURE, BLOOD (ROUTINE X 2)     Status: Normal (Preliminary result)   Collection Time   12/15/10 11:47 AM      Component Value Range Status Comment   Specimen Description BLOOD LEFT ARM   Final    Special Requests BOTTLES DRAWN AEROBIC ONLY 4CC   Final    Culture NO GROWTH 2 DAYS   Final    Report Status PENDING   Incomplete     Studies/Results: Dg Chest 2 View  12/15/2010  *RADIOLOGY REPORT*  Clinical Data: Shortness of breath, fever.  CHEST - 2 VIEW  Comparison: None.  Findings: Consolidation is seen in the right lower lobe compatible with pneumonia.  Patchy left lower lobe atelectasis or infiltrate. Heart is borderline in size.  No effusions.  No acute bony abnormality.  IMPRESSION: Dense right lower lobe pneumonia.  Left basilar atelectasis or infiltrate.  Original Report Authenticated By: Raelyn Number, M.D.    Medications: I have reviewed the patient's current medications.  Assessment: Principal Problem:  *PNA (pneumonia): With superimposed acute bronchitis -  Continue bronchodilator therapy, azithromycin and Rocephin, swallow evaluation  Active Problems:  DM type 2 (diabetes mellitus, type 2): Blood sugars improving from yesterday, continue sliding scale insulin and glipizide, tradjenta and lantus   HTN (hypertension): Increase lisinopril to 40, continue Coreg and Norvasc   ARF (acute renal failure)Resolved, likely secondary to prerenal azotemia in the setting of pneumonia. -  Change IV fluids to KVO.   Hypothyroidism: Continue with levothyroxine   Hyperlipidemia: Continue Crestor  Disposition: Overall clinically significantly improved likely DC tomorrow am. OOB, inc mobilty   LOS: 2 days   RAI,RIPUDEEP 12/17/2010, 12:09 PM

## 2010-12-18 LAB — LEGIONELLA ANTIGEN, URINE

## 2010-12-18 LAB — CBC
HCT: 29.4 % — ABNORMAL LOW (ref 36.0–46.0)
RBC: 3.4 MIL/uL — ABNORMAL LOW (ref 3.87–5.11)
RDW: 18.4 % — ABNORMAL HIGH (ref 11.5–15.5)
WBC: 10.6 10*3/uL — ABNORMAL HIGH (ref 4.0–10.5)

## 2010-12-18 LAB — BASIC METABOLIC PANEL
BUN: 20 mg/dL (ref 6–23)
CO2: 24 mEq/L (ref 19–32)
Chloride: 108 mEq/L (ref 96–112)
GFR calc Af Amer: 57 mL/min — ABNORMAL LOW (ref >90)
Potassium: 5 mEq/L (ref 3.5–5.1)

## 2010-12-18 LAB — GLUCOSE, CAPILLARY: Glucose-Capillary: 81 mg/dL (ref 70–99)

## 2010-12-18 MED ORDER — AZITHROMYCIN 500 MG PO TABS: 500.0000 mg | ORAL_TABLET | Freq: Every day | ORAL | Status: AC

## 2010-12-18 MED ORDER — BENZONATATE 100 MG PO CAPS: 100.0000 mg | ORAL_CAPSULE | Freq: Three times a day (TID) | ORAL | Status: AC

## 2010-12-18 MED ORDER — CEFUROXIME AXETIL 500 MG PO TABS: 500.0000 mg | ORAL_TABLET | Freq: Two times a day (BID) | ORAL | Status: AC

## 2010-12-18 NOTE — Progress Notes (Signed)
D/c instructions reviewed with patient.  Verbalized understanding. Pt dc'd to home with son.  Schonewitz, Eulis Canner 12/18/2010

## 2010-12-18 NOTE — Discharge Summary (Signed)
Physician Discharge Summary  Patient ID: Beth Padilla MRN: [id number] DOB/AGE: Jun 10, 1940 70 y.o.  Admit date: 12/15/2010 Discharge date: 12/18/2010  Primary Care Physician:  No primary provider on file.  Discharge Diagnoses:   Present on Admission:  .PNA (pneumonia): Improved  .DM type 2 (diabetes mellitus, type 2) .HTN (hypertension) .Anemia .ARF (acute renal failure): Resolved  .Hypothyroidism .Hyperlipidemia .Pyuria  Consults:  None  Discharge Medications: Current Discharge Medication List    START taking these medications   Details  azithromycin (ZITHROMAX) 500 MG tablet Take 1 tablet (500 mg total) by mouth daily. Qty: 7 tablet, Refills: 0    benzonatate (TESSALON) 100 MG capsule Take 1 capsule (100 mg total) by mouth 3 (three) times daily. Qty: 30 capsule, Refills: 0    cefUROXime (CEFTIN) 500 MG tablet Take 1 tablet (500 mg total) by mouth 2 (two) times daily. Qty: 14 tablet, Refills: 0      CONTINUE these medications which have NOT CHANGED   Details  albuterol (PROVENTIL HFA;VENTOLIN HFA) 108 (90 BASE) MCG/ACT inhaler Inhale 2 puffs into the lungs every 6 (six) hours as needed. Shortness of breath     amLODipine (NORVASC) 10 MG tablet Take 10 mg by mouth daily.      aspirin EC 325 MG tablet Take 325 mg by mouth daily.      carvedilol (COREG) 6.25 MG tablet Take 6.25 mg by mouth 2 (two) times daily with a meal.      citalopram (CELEXA) 40 MG tablet Take 40 mg by mouth daily.      glipiZIDE (GLUCOTROL) 5 MG tablet Take 5 mg by mouth 2 (two) times daily before a meal.      levothyroxine (SYNTHROID, LEVOTHROID) 88 MCG tablet Take 88 mcg by mouth daily.      lisinopril-hydrochlorothiazide (PRINZIDE,ZESTORETIC) 20-12.5 MG per tablet Take 1 tablet by mouth daily.      simvastatin (ZOCOR) 40 MG tablet Take 40 mg by mouth at bedtime.      sitaGLIPtin (JANUVIA) 100 MG tablet Take 100 mg by mouth daily.        STOP taking these medications     levofloxacin (LEVAQUIN) 750 MG tablet          Significant Diagnostic Studies:  Dg Chest 2 View  12/15/2010  *RADIOLOGY REPORT*  Clinical Data: Shortness of breath, fever.  CHEST - 2 VIEW  Comparison: None.  Findings: Consolidation is seen in the right lower lobe compatible with pneumonia.  Patchy left lower lobe atelectasis or infiltrate. Heart is borderline in size.  No effusions.  No acute bony abnormality.  IMPRESSION: Dense right lower lobe pneumonia.  Left basilar atelectasis or infiltrate.  Original Report Authenticated By: Raelyn Number, M.D.    Brief H and P: For complete details please refer to admission H and P, but in brief, patient is a 70 year old female with past medical history of hypertension type 2 diabetes presented to the emergency department with chief complaint of wheezing and chest congestion. Patient's symptoms had started one week prior to admission with some chest congestion which had progressed to wheezing and shortness of breath. Patient had seen her primary care physician a day before her admission and outpatient chest x-ray was ordered and revealed pneumonia. Patient was prescribed albuterol inhaler and Levaquin but her symptoms did not subside and she presented to the emergency department when she was admitted with right lower lobe pneumonia on chest x-ray.    Hospital Course:   PNA (pneumonia): With superimposed acute  bronchitis  Patient was admitted to the hospital, chest x-ray was obtained which revealed dense right lower lobe pneumonia. She also had few bronchospasms on her lung exam with wheezing. Patient was placed on scheduled nebulizers, Rocephin and Zithromax, oxygen supplementation. The patient has currently improved significantly and will be discharged on a Zithromax and Ceftin for 7 days.  Active Problems:  DM type 2 (diabetes mellitus, type 2): Patient was placed on sliding scale insulin while inpatient.   HTN (hypertension): All the outpatient  antihypertensive has been started at discharge. Lisinopril and hydrochlorothiazide were initially held due to acute renal insufficiency.  ARF (acute renal failure)Resolved, likely secondary to prerenal azotemia in the setting of pneumonia, lisinopril and HCTZ were held.   Hypothyroidism: Continue with levothyroxine   Hyperlipidemia: Patient to continue Zocor  Day of Discharge BP 170/68  Pulse 54  Temp(Src) 98.6 F (37 C) (Oral)  Resp 17  Ht 5\' 8"  (1.727 m)  Wt 99.3 kg (218 lb 14.7 oz)  BMI 33.29 kg/m2  SpO2 100%  LAB RESULTS: Basic Metabolic Panel:  Lab [id number] 0500 12/17/10 0640  NA 139 138  K 5.0 4.8  CL 108 108  CO2 24 22  GLUCOSE 84 130*  BUN 20 22  CREATININE 1.10 1.19*  CALCIUM 8.9 8.7  MG -- --  PHOS -- --   CBC:  Lab 12/18/10 0500 12/17/10 0640 12/15/10 1133  WBC 10.6* 17.1* --  NEUTROABS -- -- 10.8*  HGB 9.5* 8.7* --  HCT 29.4* 26.7* --  MCV 86.5 -- --  PLT 383 341 --   CBG:  Lab 12/18/10 0737 12/17/10 2054  GLUCAP 81 132*     Physical Exam: General: Alert and awake oriented x3 not in any acute distress. HEENT: anicteric sclera, pupils reactive to light and accommodation CVS: S1-S2 clear no murmur rubs or gallops Chest: clear to auscultation bilaterally, no wheezing rales or rhonchi Abdomen: soft nontender, nondistended, normal bowel sounds, no organomegaly Extremities: no cyanosis, clubbing or edema noted bilaterally Neuro: Cranial nerves II-XII intact, no focal neurological deficits   Disposition and Follow-up: Discharge Orders    Future Orders Please Complete By Expires   Diet Carb Modified      Increase activity slowly          DISPOSITION: Home  DIET: carb modified diet  ACTIVITY: As tolerated   DISCHARGE FOLLOW-UP Follow-up Information    Follow up with Primary Care provider . Make an appointment in 10 days. (or earlier  if symptoms worsen)          Time spent on Discharge: 45  mins  Signed: RAI,RIPUDEEP 12/18/2010, 11:25 AM

## 2010-12-19 NOTE — Progress Notes (Signed)
Utilization review completed.  

## 2010-12-20 LAB — CULTURE, BLOOD (ROUTINE X 2)

## 2012-09-20 ENCOUNTER — Emergency Department (HOSPITAL_COMMUNITY)
Admission: EM | Admit: 2012-09-20 | Discharge: 2012-09-20 | Disposition: A | Discharge status: Home / Self Care | Payer: Medicare Other | Attending: Emergency Medicine | Admitting: Emergency Medicine

## 2012-09-20 ENCOUNTER — Emergency Department (HOSPITAL_COMMUNITY): Payer: Medicare Other

## 2012-09-20 ENCOUNTER — Encounter (HOSPITAL_COMMUNITY): Payer: Self-pay | Admitting: *Deleted

## 2012-09-20 DIAGNOSIS — Z79899 Other long term (current) drug therapy: Secondary | ICD-10-CM | POA: Insufficient documentation

## 2012-09-20 DIAGNOSIS — Z880 Allergy status to penicillin: Secondary | ICD-10-CM | POA: Insufficient documentation

## 2012-09-20 DIAGNOSIS — E039 Hypothyroidism, unspecified: Secondary | ICD-10-CM | POA: Insufficient documentation

## 2012-09-20 DIAGNOSIS — Z87448 Personal history of other diseases of urinary system: Secondary | ICD-10-CM | POA: Insufficient documentation

## 2012-09-20 DIAGNOSIS — F329 Major depressive disorder, single episode, unspecified: Secondary | ICD-10-CM | POA: Insufficient documentation

## 2012-09-20 DIAGNOSIS — Z7901 Long term (current) use of anticoagulants: Secondary | ICD-10-CM | POA: Insufficient documentation

## 2012-09-20 DIAGNOSIS — E785 Hyperlipidemia, unspecified: Secondary | ICD-10-CM | POA: Insufficient documentation

## 2012-09-20 DIAGNOSIS — E119 Type 2 diabetes mellitus without complications: Secondary | ICD-10-CM | POA: Insufficient documentation

## 2012-09-20 DIAGNOSIS — I509 Heart failure, unspecified: Secondary | ICD-10-CM | POA: Insufficient documentation

## 2012-09-20 DIAGNOSIS — Z7982 Long term (current) use of aspirin: Secondary | ICD-10-CM | POA: Insufficient documentation

## 2012-09-20 DIAGNOSIS — I10 Essential (primary) hypertension: Secondary | ICD-10-CM | POA: Insufficient documentation

## 2012-09-20 DIAGNOSIS — F3289 Other specified depressive episodes: Secondary | ICD-10-CM | POA: Insufficient documentation

## 2012-09-20 DIAGNOSIS — Z862 Personal history of diseases of the blood and blood-forming organs and certain disorders involving the immune mechanism: Secondary | ICD-10-CM | POA: Insufficient documentation

## 2012-09-20 HISTORY — DX: Disorder of kidney and ureter, unspecified: N28.9

## 2012-09-20 LAB — D-DIMER, QUANTITATIVE: D-Dimer, Quant: 0.71 ug/mL-FEU — ABNORMAL HIGH (ref 0.00–0.48)

## 2012-09-20 LAB — CBC WITH DIFFERENTIAL/PLATELET
Basophils Absolute: 0.1 10*3/uL (ref 0.0–0.1)
Lymphocytes Relative: 15 % (ref 12–46)
Neutro Abs: 8.3 10*3/uL — ABNORMAL HIGH (ref 1.7–7.7)
Platelets: 283 10*3/uL (ref 150–400)
RBC: 3.61 MIL/uL — ABNORMAL LOW (ref 3.87–5.11)
RDW: 16.6 % — ABNORMAL HIGH (ref 11.5–15.5)
WBC: 10.7 10*3/uL — ABNORMAL HIGH (ref 4.0–10.5)

## 2012-09-20 LAB — BASIC METABOLIC PANEL
CO2: 23 mEq/L (ref 19–32)
Chloride: 104 mEq/L (ref 96–112)
Potassium: 5.2 mEq/L — ABNORMAL HIGH (ref 3.5–5.1)
Sodium: 137 mEq/L (ref 135–145)

## 2012-09-20 LAB — PRO B NATRIURETIC PEPTIDE: Pro B Natriuretic peptide (BNP): 1520 pg/mL — ABNORMAL HIGH (ref 0–125)

## 2012-09-20 LAB — TROPONIN I: Troponin I: <0.3 ng/mL (ref <0.30)

## 2012-09-20 MED ORDER — FUROSEMIDE 20 MG PO TABS: 20.0000 mg | ORAL_TABLET | Freq: Every day | ORAL | Status: DC

## 2012-09-20 NOTE — ED Notes (Signed)
Has coughed up few blood clots last two mornings after being put on Xarelto recently.  States hemoptysis diminishes throughout morning.  No report of epistaxis, but does have dry mucous membranes in A.M.

## 2012-09-20 NOTE — ED Provider Notes (Signed)
CSN: [id number]     Arrival date & time 09/20/12  1245 History   First MD Initiated Contact with Patient 09/20/12 1332     Chief Complaint  Patient presents with  . Cough   (Consider location/radiation/quality/duration/timing/severity/associated sxs/prior Treatment) Patient is a 72 y.o. female presenting with cough. The history is provided by the patient (the pt states she has been coughing for a few days and in the am she coughs up some blood).  Cough Cough characteristics:  Productive Sputum characteristics: some blood. Severity:  Mild Onset quality:  Gradual Timing:  Intermittent Progression:  Waxing and waning Chronicity:  New Context: not animal exposure   Associated symptoms: no chest pain, no eye discharge, no headaches and no rash     Past Medical History  Diagnosis Date  . Diabetes mellitus   . Hypertension   . Hypothyroidism 12/15/2010  . Hyperlipidemia 12/15/2010  . Anemia   . Depression   . Renal disorder    Past Surgical History  Procedure Laterality Date  . Abdominal hysterectomy     History reviewed. No pertinent family history. History  Substance Use Topics  . Smoking status: Never Smoker   . Smokeless tobacco: Not on file  . Alcohol Use: No   OB History   Grav Para Term Preterm Abortions TAB SAB Ect Mult Living                 Review of Systems  Constitutional: Negative for appetite change and fatigue.  HENT: Negative for congestion, sinus pressure and ear discharge.   Eyes: Negative for discharge.  Respiratory: Positive for cough.   Cardiovascular: Negative for chest pain.  Gastrointestinal: Negative for abdominal pain and diarrhea.  Genitourinary: Negative for frequency and hematuria.  Musculoskeletal: Negative for back pain.  Skin: Negative for rash.  Neurological: Negative for seizures and headaches.  Psychiatric/Behavioral: Negative for hallucinations.    Allergies  Ampicillin  Home Medications   Current Outpatient Rx  Name   Route  Sig  Dispense  Refill  . albuterol (PROVENTIL HFA;VENTOLIN HFA) 108 (90 BASE) MCG/ACT inhaler   Inhalation   Inhale 2 puffs into the lungs every 6 (six) hours as needed. Shortness of breath          . aspirin 81 MG chewable tablet   Oral   Chew 81 mg by mouth daily.         Marland Kitchen atorvastatin (LIPITOR) 40 MG tablet   Oral   Take 40 mg by mouth every evening.         . diltiazem (CARDIZEM CD) 240 MG 24 hr capsule   Oral   Take 240 mg by mouth daily.         Marland Kitchen escitalopram (LEXAPRO) 10 MG tablet   Oral   Take 10 mg by mouth daily.         Marland Kitchen glipiZIDE (GLUCOTROL) 5 MG tablet   Oral   Take 5 mg by mouth 2 (two) times daily before a meal.           . hydrALAZINE (APRESOLINE) 25 MG tablet   Oral   Take 50 mg by mouth 3 (three) times daily.         Marland Kitchen levothyroxine (SYNTHROID, LEVOTHROID) 125 MCG tablet   Oral   Take 125 mcg by mouth daily before breakfast.         . Rivaroxaban (XARELTO) 15 MG TABS tablet   Oral   Take 15 mg by mouth daily with supper.         Marland Kitchen  furosemide (LASIX) 20 MG tablet   Oral   Take 1 tablet (20 mg total) by mouth daily.   4 tablet   0    BP 180/47  Pulse 59  Temp(Src) 98.8 F (37.1 C) (Oral)  Resp 18  Ht 5\' 7"  (1.702 m)  Wt 240 lb (108.863 kg)  BMI 37.58 kg/m2  SpO2 98% Physical Exam  Constitutional: She is oriented to person, place, and time. She appears well-developed.  HENT:  Head: Normocephalic.  Eyes: Conjunctivae and EOM are normal. No scleral icterus.  Neck: Neck supple. No thyromegaly present.  Cardiovascular: Normal rate and regular rhythm.  Exam reveals no gallop and no friction rub.   No murmur heard. Pulmonary/Chest: No stridor. She has no wheezes. She has no rales. She exhibits no tenderness.  Abdominal: She exhibits no distension. There is no tenderness. There is no rebound.  Musculoskeletal: Normal range of motion. She exhibits no edema.  Lymphadenopathy:    She has no cervical adenopathy.   Neurological: She is oriented to person, place, and time. Coordination normal.  Skin: No rash noted. No erythema.  Psychiatric: She has a normal mood and affect. Her behavior is normal.    ED Course  Procedures (including critical care time) Labs Review Labs Reviewed  CBC WITH DIFFERENTIAL - Abnormal; Notable for the following:    WBC 10.7 (*)    RBC 3.61 (*)    Hemoglobin 10.5 (*)    HCT 33.0 (*)    RDW 16.6 (*)    Neutro Abs 8.3 (*)    All other components within normal limits  BASIC METABOLIC PANEL - Abnormal; Notable for the following:    Potassium 5.2 (*)    Glucose, Bld 282 (*)    BUN 30 (*)    Creatinine, Ser 1.79 (*)    GFR calc non Af Amer 27 (*)    GFR calc Af Amer 31 (*)    All other components within normal limits  PRO B NATRIURETIC PEPTIDE - Abnormal; Notable for the following:    Pro B Natriuretic peptide (BNP) 1520.0 (*)    All other components within normal limits  D-DIMER, QUANTITATIVE - Abnormal; Notable for the following:    D-Dimer, Quant 0.71 (*)    All other components within normal limits  TROPONIN I   Imaging Review Dg Chest 2 View  09/20/2012   *RADIOLOGY REPORT*  Clinical Data: Cough  CHEST - 2 VIEW  Comparison: Prior chest x-ray 12/15/2010  Findings: Heart is upper limits of normal for size.  Mediastinal contours within normal limits.  Atherosclerotic calcifications noted in the transverse aorta.  Mild pulmonary vascular congestion without overt edema.  Central bronchitic changes are noted.  No focal airspace consolidation, pleural effusion or pneumothorax.  No suspicious pulmonary nodules.  No acute osseous abnormality.  IMPRESSION:  1.  Pulmonary vascular congestion without overt edema. 2.  Heart is upper limits of normal for size. 3.  Aortic atherosclerosis.   Original Report Authenticated By: Jacqulynn Cadet, M.D.   Date: 09/20/2012  Rate: 73  Rhythm: normal sinus rhythm  QRS Axis: normal  Intervals: normal  ST/T Wave abnormalities:  nonspecific ST changes  Conduction Disutrbances:none  Narrative Interpretation:   Old EKG Reviewed: unchanged    MDM  Pt with mild chf will tx with lasix and holding xarelto 1. CHF (congestive heart failure)        Maudry Diego, MD 09/20/12 [phone]

## 2012-09-20 NOTE — ED Notes (Signed)
Patient with no complaints at this time. Respirations even and unlabored. Skin warm/dry. Discharge instructions reviewed with patient at this time. Patient given opportunity to voice concerns/ask questions. Patient discharged at this time and left Emergency Department with steady gait.   

## 2012-09-20 NOTE — ED Notes (Signed)
Sneezing and coughing since Tuesday and began coughing up blood.  Went to MD today and told that her Bp was up. Pt says she is taking xarelto.

## 2012-09-20 NOTE — ED Notes (Signed)
Meal tray is being ordered for paitent

## 2012-09-20 NOTE — ED Notes (Signed)
Patient and family given a drink at this time.

## 2013-09-14 ENCOUNTER — Emergency Department (HOSPITAL_COMMUNITY): Payer: Medicare Other

## 2013-09-14 ENCOUNTER — Encounter (HOSPITAL_COMMUNITY): Payer: Self-pay | Admitting: Emergency Medicine

## 2013-09-14 ENCOUNTER — Inpatient Hospital Stay (HOSPITAL_COMMUNITY)
Admission: EM | Admit: 2013-09-14 | Discharge: 2013-09-17 | DRG: 291 | Disposition: A | Discharge status: Home / Self Care | Payer: Medicare Other | Attending: Family Medicine | Admitting: Family Medicine

## 2013-09-14 DIAGNOSIS — N390 Urinary tract infection, site not specified: Secondary | ICD-10-CM | POA: Diagnosis present

## 2013-09-14 DIAGNOSIS — I4891 Unspecified atrial fibrillation: Secondary | ICD-10-CM | POA: Diagnosis present

## 2013-09-14 DIAGNOSIS — I509 Heart failure, unspecified: Secondary | ICD-10-CM | POA: Diagnosis present

## 2013-09-14 DIAGNOSIS — R0602 Shortness of breath: Secondary | ICD-10-CM | POA: Diagnosis present

## 2013-09-14 DIAGNOSIS — E039 Hypothyroidism, unspecified: Secondary | ICD-10-CM | POA: Diagnosis present

## 2013-09-14 DIAGNOSIS — Z823 Family history of stroke: Secondary | ICD-10-CM

## 2013-09-14 DIAGNOSIS — Z794 Long term (current) use of insulin: Secondary | ICD-10-CM

## 2013-09-14 DIAGNOSIS — N183 Chronic kidney disease, stage 3 unspecified: Secondary | ICD-10-CM | POA: Diagnosis present

## 2013-09-14 DIAGNOSIS — I5031 Acute diastolic (congestive) heart failure: Principal | ICD-10-CM | POA: Diagnosis present

## 2013-09-14 DIAGNOSIS — F3289 Other specified depressive episodes: Secondary | ICD-10-CM | POA: Diagnosis present

## 2013-09-14 DIAGNOSIS — E785 Hyperlipidemia, unspecified: Secondary | ICD-10-CM | POA: Diagnosis present

## 2013-09-14 DIAGNOSIS — I10 Essential (primary) hypertension: Secondary | ICD-10-CM | POA: Diagnosis present

## 2013-09-14 DIAGNOSIS — D649 Anemia, unspecified: Secondary | ICD-10-CM | POA: Diagnosis present

## 2013-09-14 DIAGNOSIS — I498 Other specified cardiac arrhythmias: Secondary | ICD-10-CM

## 2013-09-14 DIAGNOSIS — Z7982 Long term (current) use of aspirin: Secondary | ICD-10-CM

## 2013-09-14 DIAGNOSIS — E119 Type 2 diabetes mellitus without complications: Secondary | ICD-10-CM | POA: Diagnosis present

## 2013-09-14 DIAGNOSIS — I129 Hypertensive chronic kidney disease with stage 1 through stage 4 chronic kidney disease, or unspecified chronic kidney disease: Secondary | ICD-10-CM | POA: Diagnosis present

## 2013-09-14 DIAGNOSIS — J189 Pneumonia, unspecified organism: Secondary | ICD-10-CM | POA: Diagnosis present

## 2013-09-14 DIAGNOSIS — N179 Acute kidney failure, unspecified: Secondary | ICD-10-CM | POA: Diagnosis present

## 2013-09-14 DIAGNOSIS — R001 Bradycardia, unspecified: Secondary | ICD-10-CM | POA: Diagnosis present

## 2013-09-14 DIAGNOSIS — J45909 Unspecified asthma, uncomplicated: Secondary | ICD-10-CM | POA: Diagnosis present

## 2013-09-14 DIAGNOSIS — F329 Major depressive disorder, single episode, unspecified: Secondary | ICD-10-CM | POA: Diagnosis present

## 2013-09-14 DIAGNOSIS — N3000 Acute cystitis without hematuria: Secondary | ICD-10-CM

## 2013-09-14 DIAGNOSIS — E1121 Type 2 diabetes mellitus with diabetic nephropathy: Secondary | ICD-10-CM | POA: Diagnosis present

## 2013-09-14 LAB — IRON AND TIBC
Iron: 21 ug/dL — ABNORMAL LOW (ref 42–135)
Saturation Ratios: 8 % — ABNORMAL LOW (ref 20–55)
TIBC: 265 ug/dL (ref 250–470)
UIBC: 244 ug/dL (ref 125–400)

## 2013-09-14 LAB — BASIC METABOLIC PANEL
ANION GAP: 17 — AB (ref 5–15)
BUN: 51 mg/dL — AB (ref 6–23)
CHLORIDE: 99 meq/L (ref 96–112)
CO2: 21 meq/L (ref 19–32)
CREATININE: 2.6 mg/dL — AB (ref 0.50–1.10)
Calcium: 8.9 mg/dL (ref 8.4–10.5)
GFR calc Af Amer: 20 mL/min — ABNORMAL LOW (ref >90)
GFR calc non Af Amer: 17 mL/min — ABNORMAL LOW (ref >90)
Glucose, Bld: 153 mg/dL — ABNORMAL HIGH (ref 70–99)
Potassium: 4.2 mEq/L (ref 3.7–5.3)
Sodium: 137 mEq/L (ref 137–147)

## 2013-09-14 LAB — CREATININE, URINE, RANDOM: Creatinine, Urine: 22.71 mg/dL

## 2013-09-14 LAB — URINALYSIS, ROUTINE W REFLEX MICROSCOPIC
BILIRUBIN URINE: NEGATIVE
Glucose, UA: NEGATIVE mg/dL
Hgb urine dipstick: NEGATIVE
Ketones, ur: NEGATIVE mg/dL
NITRITE: NEGATIVE
SPECIFIC GRAVITY, URINE: 1.01 (ref 1.005–1.030)
UROBILINOGEN UA: 0.2 mg/dL (ref 0.0–1.0)
pH: 6 (ref 5.0–8.0)

## 2013-09-14 LAB — CBC WITH DIFFERENTIAL/PLATELET
Basophils Absolute: 0 10*3/uL (ref 0.0–0.1)
Basophils Relative: 0 % (ref 0–1)
Eosinophils Absolute: 0.2 10*3/uL (ref 0.0–0.7)
Eosinophils Relative: 2 % (ref 0–5)
HEMATOCRIT: 31.2 % — AB (ref 36.0–46.0)
HEMOGLOBIN: 10.2 g/dL — AB (ref 12.0–15.0)
LYMPHS PCT: 11 % — AB (ref 12–46)
Lymphs Abs: 0.9 10*3/uL (ref 0.7–4.0)
MCH: 28.8 pg (ref 26.0–34.0)
MCHC: 32.7 g/dL (ref 30.0–36.0)
MCV: 88.1 fL (ref 78.0–100.0)
MONO ABS: 0.6 10*3/uL (ref 0.1–1.0)
MONOS PCT: 6 % (ref 3–12)
NEUTROS ABS: 7.2 10*3/uL (ref 1.7–7.7)
Neutrophils Relative %: 81 % — ABNORMAL HIGH (ref 43–77)
Platelets: 333 10*3/uL (ref 150–400)
RBC: 3.54 MIL/uL — AB (ref 3.87–5.11)
RDW: 16.1 % — ABNORMAL HIGH (ref 11.5–15.5)
WBC: 8.8 10*3/uL (ref 4.0–10.5)

## 2013-09-14 LAB — MRSA PCR SCREENING: MRSA by PCR: NEGATIVE

## 2013-09-14 LAB — URINE MICROSCOPIC-ADD ON

## 2013-09-14 LAB — GLUCOSE, CAPILLARY
GLUCOSE-CAPILLARY: 178 mg/dL — AB (ref 70–99)
Glucose-Capillary: 139 mg/dL — ABNORMAL HIGH (ref 70–99)

## 2013-09-14 LAB — PROTIME-INR
INR: 1.15 (ref 0.00–1.49)
PROTHROMBIN TIME: 14.7 s (ref 11.6–15.2)

## 2013-09-14 LAB — TSH: TSH: 5.67 u[IU]/mL — ABNORMAL HIGH (ref 0.350–4.500)

## 2013-09-14 LAB — LACTIC ACID, PLASMA: Lactic Acid, Venous: 1.4 mmol/L (ref 0.5–2.2)

## 2013-09-14 LAB — TROPONIN I
Troponin I: <0.3 ng/mL (ref <0.30)
Troponin I: <0.3 ng/mL (ref <0.30)

## 2013-09-14 LAB — SODIUM, URINE, RANDOM: Sodium, Ur: 107 mEq/L

## 2013-09-14 LAB — PRO B NATRIURETIC PEPTIDE: PRO B NATRI PEPTIDE: 5214 pg/mL — AB (ref 0–125)

## 2013-09-14 MED ORDER — ATROPINE SULFATE 1 MG/ML IJ SOLN
INTRAMUSCULAR | Status: AC
  Administered 2013-09-14: 0.5 mg via INTRAVENOUS
  Filled 2013-09-14: qty 1

## 2013-09-14 MED ORDER — ESCITALOPRAM OXALATE 10 MG PO TABS
10.0000 mg | ORAL_TABLET | Freq: Every day | ORAL | Status: DC
  Administered 2013-09-15 – 2013-09-17 (×3): 10 mg via ORAL
  Filled 2013-09-14 (×3): qty 1

## 2013-09-14 MED ORDER — PANTOPRAZOLE SODIUM 40 MG PO TBEC
40.0000 mg | DELAYED_RELEASE_TABLET | Freq: Every day | ORAL | Status: DC
  Administered 2013-09-15 – 2013-09-17 (×3): 40 mg via ORAL
  Filled 2013-09-14 (×3): qty 1

## 2013-09-14 MED ORDER — HYDRALAZINE HCL 25 MG PO TABS
ORAL_TABLET | ORAL | Status: AC
  Filled 2013-09-14: qty 4

## 2013-09-14 MED ORDER — DEXTROSE 5 % IV SOLN
INTRAVENOUS | Status: AC
  Filled 2013-09-14: qty 10

## 2013-09-14 MED ORDER — ASPIRIN 81 MG PO CHEW
81.0000 mg | CHEWABLE_TABLET | Freq: Every day | ORAL | Status: DC
  Administered 2013-09-15 – 2013-09-17 (×3): 81 mg via ORAL
  Filled 2013-09-14 (×3): qty 1

## 2013-09-14 MED ORDER — ATROPINE SULFATE 1 MG/ML IJ SOLN
0.5000 mg | Freq: Once | INTRAMUSCULAR | Status: AC
  Administered 2013-09-14: 0.5 mg via INTRAVENOUS

## 2013-09-14 MED ORDER — INSULIN GLARGINE 100 UNIT/ML ~~LOC~~ SOLN
10.0000 [IU] | Freq: Every day | SUBCUTANEOUS | Status: DC
  Administered 2013-09-14 – 2013-09-16 (×3): 10 [IU] via SUBCUTANEOUS
  Filled 2013-09-14 (×4): qty 0.1

## 2013-09-14 MED ORDER — ATORVASTATIN CALCIUM 40 MG PO TABS
40.0000 mg | ORAL_TABLET | Freq: Every evening | ORAL | Status: DC
  Administered 2013-09-14 – 2013-09-16 (×3): 40 mg via ORAL
  Filled 2013-09-14 (×3): qty 1

## 2013-09-14 MED ORDER — ALBUTEROL SULFATE (2.5 MG/3ML) 0.083% IN NEBU: 2.5000 mg | INHALATION_SOLUTION | RESPIRATORY_TRACT | Status: DC | PRN

## 2013-09-14 MED ORDER — DEXTROSE 5 % IV SOLN
1.0000 g | INTRAVENOUS | Status: DC
  Administered 2013-09-14 – 2013-09-16 (×3): 1 g via INTRAVENOUS
  Filled 2013-09-14 (×4): qty 10

## 2013-09-14 MED ORDER — FUROSEMIDE 40 MG PO TABS: 40.0000 mg | ORAL_TABLET | Freq: Two times a day (BID) | ORAL | Status: DC

## 2013-09-14 MED ORDER — IPRATROPIUM-ALBUTEROL 0.5-2.5 (3) MG/3ML IN SOLN
3.0000 mL | Freq: Once | RESPIRATORY_TRACT | Status: AC
  Administered 2013-09-14: 3 mL via RESPIRATORY_TRACT
  Filled 2013-09-14: qty 3

## 2013-09-14 MED ORDER — ALBUTEROL SULFATE HFA 108 (90 BASE) MCG/ACT IN AERS: 2.0000 | INHALATION_SPRAY | Freq: Three times a day (TID) | RESPIRATORY_TRACT | Status: DC | PRN

## 2013-09-14 MED ORDER — INSULIN ASPART 100 UNIT/ML ~~LOC~~ SOLN
0.0000 [IU] | Freq: Three times a day (TID) | SUBCUTANEOUS | Status: DC
Start: 2013-09-15 — End: 2013-09-17
  Administered 2013-09-15 – 2013-09-16 (×2): 2 [IU] via SUBCUTANEOUS

## 2013-09-14 MED ORDER — FUROSEMIDE 10 MG/ML IJ SOLN
40.0000 mg | Freq: Two times a day (BID) | INTRAMUSCULAR | Status: DC
  Administered 2013-09-14 – 2013-09-16 (×4): 40 mg via INTRAVENOUS
  Filled 2013-09-14 (×4): qty 4

## 2013-09-14 MED ORDER — AMLODIPINE BESYLATE 5 MG PO TABS: 10.0000 mg | ORAL_TABLET | Freq: Every day | ORAL | Status: DC

## 2013-09-14 MED ORDER — HEPARIN SODIUM (PORCINE) 5000 UNIT/ML IJ SOLN
5000.0000 [IU] | Freq: Three times a day (TID) | INTRAMUSCULAR | Status: DC
  Administered 2013-09-14 – 2013-09-17 (×8): 5000 [IU] via SUBCUTANEOUS
  Filled 2013-09-14 (×6): qty 1

## 2013-09-14 MED ORDER — FUROSEMIDE 10 MG/ML IJ SOLN
20.0000 mg | Freq: Once | INTRAMUSCULAR | Status: AC
  Administered 2013-09-14: 20 mg via INTRAVENOUS
  Filled 2013-09-14: qty 2

## 2013-09-14 MED ORDER — HYDRALAZINE HCL 25 MG PO TABS
100.0000 mg | ORAL_TABLET | Freq: Three times a day (TID) | ORAL | Status: DC
  Administered 2013-09-14 – 2013-09-17 (×9): 100 mg via ORAL
  Filled 2013-09-14 (×2): qty 2
  Filled 2013-09-14 (×4): qty 4
  Filled 2013-09-14: qty 2
  Filled 2013-09-14 (×5): qty 4

## 2013-09-14 MED ORDER — LEVOTHYROXINE SODIUM 100 MCG PO TABS
100.0000 ug | ORAL_TABLET | Freq: Every day | ORAL | Status: DC
  Administered 2013-09-15: 100 ug via ORAL
  Filled 2013-09-14: qty 1

## 2013-09-14 MED ORDER — AMLODIPINE BESYLATE 5 MG PO TABS
10.0000 mg | ORAL_TABLET | Freq: Every day | ORAL | Status: DC
  Administered 2013-09-15 – 2013-09-17 (×3): 10 mg via ORAL
  Filled 2013-09-14 (×3): qty 2

## 2013-09-14 MED ORDER — DIPHENHYDRAMINE HCL 25 MG PO CAPS
25.0000 mg | ORAL_CAPSULE | Freq: Once | ORAL | Status: AC
  Administered 2013-09-14: 25 mg via ORAL
  Filled 2013-09-14: qty 1

## 2013-09-14 MED ORDER — METOPROLOL SUCCINATE ER 50 MG PO TB24: 50.0000 mg | ORAL_TABLET | Freq: Every day | ORAL | Status: DC

## 2013-09-14 NOTE — ED Notes (Addendum)
Pt states that she was diagnosed with pneumonia on Thursday,placed on antibiotics,  noticed increase in sob this am, has used her inhaler around 9, states that it will only help "for a little bit", pt alert,denies any pain, states that she has been having problems with her legs swelling, was seen by pcp three weeks ago and placed on ?fluid pill, medication was increased Thursday while in pcp office with improvement in the swelling.

## 2013-09-14 NOTE — ED Notes (Signed)
Pt tolerated ambulation well to restroom with oxygen levels maintaining at 97%

## 2013-09-14 NOTE — ED Provider Notes (Signed)
CSN: [id number]     Arrival date & time 09/14/13  1145 History  This chart was scribed for Ezequiel Essex, MD by Starleen Arms, ED Scribe. This patient was seen in room APA14/APA14 and the patient's care was started at 12:10 PM.   Chief Complaint  Patient presents with  . Shortness of Breath   The history is provided by the patient. No language interpreter was used.    HPI Comments: Beth Padilla is a 73 y.o. female with a history of asthma, A-fib, DM who presents to the Emergency Department complaining of SOB onset 3 days ago with worsening this morning and an associated non-productive, minor cough.  Patient was seen by her PCP in [place], [place] 3 days ago after noticing she was wheezing.  She was diagnosed with pneumonia and put on antibiotics.  Patient states nothing aggravates or alleviates the SOB.  Patient usees an albuterol inhaler at home.  Patient takes baby ASA daily and is prescribed Lasix 20 mg once daily and is compliant. Patient reports stopping Xarelto 10/14 due to bleeding.  Patient denies history of CHF, COPD.  Patient denies CP, fever, dizzy, light-headed, abdominal pain.    Past Medical History  Diagnosis Date  . Diabetes mellitus   . Hypertension   . Hypothyroidism 12/15/2010  . Hyperlipidemia 12/15/2010  . Anemia   . Depression   . Renal disorder    Past Surgical History  Procedure Laterality Date  . Abdominal hysterectomy     No family history on file. History  Substance Use Topics  . Smoking status: Never Smoker   . Smokeless tobacco: Not on file  . Alcohol Use: No   OB History   Grav Para Term Preterm Abortions TAB SAB Ect Mult Living                 Review of Systems  A complete 10 system review of systems was obtained and all systems are negative except as noted in the HPI and PMH.    Allergies  Ampicillin  Home Medications   Prior to Admission medications   Medication Sig Start Date End Date Taking? Authorizing Provider  albuterol  (PROVENTIL HFA;VENTOLIN HFA) 108 (90 BASE) MCG/ACT inhaler Inhale 2 puffs into the lungs 3 (three) times daily as needed. Shortness of breath   Yes Historical Provider, MD  amLODipine (NORVASC) 10 MG tablet Take 10 mg by mouth daily.   Yes Historical Provider, MD  aspirin 81 MG chewable tablet Chew 81 mg by mouth daily.   Yes Historical Provider, MD  atorvastatin (LIPITOR) 40 MG tablet Take 40 mg by mouth every evening.   Yes Historical Provider, MD  escitalopram (LEXAPRO) 10 MG tablet Take 10 mg by mouth daily.   Yes Historical Provider, MD  furosemide (LASIX) 40 MG tablet Take 40 mg by mouth 2 (two) times daily.   Yes Historical Provider, MD  glipiZIDE (GLUCOTROL) 5 MG tablet Take 5 mg by mouth 2 (two) times daily before a meal.     Yes Historical Provider, MD  hydrALAZINE (APRESOLINE) 100 MG tablet Take 100 mg by mouth 3 (three) times daily.   Yes Historical Provider, MD  insulin glargine (LANTUS) 100 UNIT/ML injection Inject 10 Units into the skin at bedtime.   Yes Historical Provider, MD  levofloxacin (LEVAQUIN) 500 MG tablet Take 500 mg by mouth daily. For 7 days. Starting 8/27   Yes Historical Provider, MD  levothyroxine (SYNTHROID, LEVOTHROID) 100 MCG tablet Take 100 mcg by mouth daily before  breakfast.   Yes Historical Provider, MD  metoprolol succinate (TOPROL-XL) 50 MG 24 hr tablet Take 50 mg by mouth daily. Take with or immediately following a meal.   Yes Historical Provider, MD  omeprazole (PRILOSEC) 20 MG capsule Take 20 mg by mouth daily.   Yes Historical Provider, MD  sitaGLIPtin (JANUVIA) 25 MG tablet Take 25 mg by mouth daily.   Yes Historical Provider, MD  torsemide (DEMADEX) 10 MG tablet Take 10 mg by mouth 2 (two) times daily.   Yes Historical Provider, MD   BP 162/43  Pulse 46  Temp(Src) 98.6 F (37 C) (Oral)  Resp 17  Ht 5\' 7"  (1.702 m)  Wt 211 lb (95.709 kg)  BMI 33.04 kg/m2  SpO2 100% Physical Exam  Nursing note and vitals reviewed. Constitutional: She is oriented  to person, place, and time. She appears well-developed and well-nourished. No distress.  HENT:  Head: Normocephalic and atraumatic.  Mouth/Throat: Oropharynx is clear and moist. No oropharyngeal exudate.  Eyes: Conjunctivae and EOM are normal. Pupils are equal, round, and reactive to light.  Neck: Normal range of motion. Neck supple.  No meningismus.  Cardiovascular: Normal rate, regular rhythm, normal heart sounds and intact distal pulses.   No murmur heard. 3/6 systolic murmur and regular bradycardia.  No swelling in legs.    Pulmonary/Chest: Effort normal and breath sounds normal. No respiratory distress. She has no wheezes. She has no rales.  Slightly increased work of breathing.  No wheezing.    Abdominal: Soft. There is no tenderness. There is no rebound and no guarding.  Musculoskeletal: Normal range of motion. She exhibits no edema and no tenderness.  Neurological: She is alert and oriented to person, place, and time. No cranial nerve deficit. She exhibits normal muscle tone. Coordination normal.  No ataxia on finger to nose bilaterally. No pronator drift. 5/5 strength throughout. CN 2-12 intact. Negative Romberg. Equal grip strength. Sensation intact. Gait is normal.   Skin: Skin is warm.  Psychiatric: She has a normal mood and affect. Her behavior is normal.    ED Course  Procedures (including critical care time)  DIAGNOSTIC STUDIES: Oxygen Saturation is 98% on RA, normal by my interpretation.    COORDINATION OF CARE:  12:19 PM Will order labs and imaging.  Patient acknowledges and agrees with plan.    12:35 PM Upon recheck, patient is sitting upright and states her breathing has improved.  She states lying flat exacerbates her SOB.    1:27 PM Informed patient of likely admission due to CHF and kidney failure.    Labs Review Labs Reviewed  CBC WITH DIFFERENTIAL - Abnormal; Notable for the following:    RBC 3.54 (*)    Hemoglobin 10.2 (*)    HCT 31.2 (*)    RDW 16.1  (*)    Neutrophils Relative % 81 (*)    Lymphocytes Relative 11 (*)    All other components within normal limits  BASIC METABOLIC PANEL - Abnormal; Notable for the following:    Glucose, Bld 153 (*)    BUN 51 (*)    Creatinine, Ser 2.60 (*)    GFR calc non Af Amer 17 (*)    GFR calc Af Amer 20 (*)    Anion gap 17 (*)    All other components within normal limits  PRO B NATRIURETIC PEPTIDE - Abnormal; Notable for the following:    Pro B Natriuretic peptide (BNP) 5214.0 (*)    All other components within normal limits  URINALYSIS, ROUTINE W REFLEX MICROSCOPIC - Abnormal; Notable for the following:    Protein, ur TRACE (*)    Leukocytes, UA SMALL (*)    All other components within normal limits  URINE MICROSCOPIC-ADD ON - Abnormal; Notable for the following:    Squamous Epithelial / LPF FEW (*)    Bacteria, UA FEW (*)    All other components within normal limits  GLUCOSE, CAPILLARY - Abnormal; Notable for the following:    Glucose-Capillary 139 (*)    All other components within normal limits  MRSA PCR SCREENING  URINE CULTURE  TROPONIN I  PROTIME-INR  LACTIC ACID, PLASMA  SODIUM, URINE, RANDOM  CREATININE, URINE, RANDOM  IRON AND TIBC  TSH  TROPONIN I  TROPONIN I  BASIC METABOLIC PANEL    Imaging Review Dg Chest 2 View  09/14/2013   CLINICAL DATA:  73 year old female with shortness of breath, recently diagnosed with pneumonia and placed on antibiotics. Lower extremity swelling cough and congestion. Initial encounter.  EXAM: CHEST  2 VIEW  COMPARISON:  09/20/2012 and earlier.  FINDINGS: Confluent lower lobe pulmonary opacity, probably bilateral based on the frontal view. Small volume pleural fluid. No pneumothorax. Upper lobes are spared. Chronic cardiomegaly. Stable mediastinal contours. Visualized tracheal air column is within normal limits. No acute osseous abnormality identified.  IMPRESSION: Bilateral lower lobe airspace disease compatible with pneumonia in the appropriate  clinical setting. Small volume pleural effusions.  Chronic cardiomegaly. Asymmetric pulmonary edema is a less likely explanation for the lower lobe findings.  Post treatment radiographs recommended to document resolution.   Electronically Signed   By: Lars Pinks M.D.   On: 09/14/2013 13:42     EKG Interpretation   Date/Time:  Sunday September 14 2013 12:37:17 EDT Ventricular Rate:  55 PR Interval:    QRS Duration: 93 QT Interval:  597 QTC Calculation: 571 R Axis:   96 Text Interpretation:  Junctional rhythm Right axis deviation Consider left  ventricular hypertrophy Nonspecific T abnormalities, inferior leads  Prolonged QT interval No significant change was found Confirmed by Wyvonnia Dusky   MD, Caitlyne Ingham [phone]) on 09/14/2013 12:45:23 PM      MDM   Final diagnoses:  Congestive heart failure, unspecified congestive heart failure chronicity, unspecified congestive heart failure type  Bradycardia  3 days of SOB with dry cough. Started on levaquin by PCP.  No chest pain or fever.  Denies history of CHF. No hypoxia, but tachypneic, fine crackles at bases.  Regular bradycardia.  No P waves seen.  CXR with pulmonary edema and effusions, less likely pneumonia. No leukocytosis or fever.  HR drops to 30s.  Patient denies symptoms.  Atropine given with increase to 50s.  She is on both BB and CCB. Suspect CHF.  BNP elevated.  Cr elevated as well, unclear baseline.  Low dose lasix given. Stepdown admission dw Dr. Clementeen Graham.   CRITICAL CARE Performed by: Ezequiel Essex Total critical care time: 30 Critical care time was exclusive of separately billable procedures and treating other patients. Critical care was necessary to treat or prevent imminent or life-threatening deterioration. Critical care was time spent personally by me on the following activities: development of treatment plan with patient and/or surrogate as well as nursing, discussions with consultants, evaluation of patient's response to  treatment, examination of patient, obtaining history from patient or surrogate, ordering and performing treatments and interventions, ordering and review of laboratory studies, ordering and review of radiographic studies, pulse oximetry and re-evaluation of patient's condition.    I  personally performed the services described in this documentation, which was scribed in my presence. The recorded information has been reviewed and is accurate.   Ezequiel Essex, MD 09/14/13 [phone]

## 2013-09-14 NOTE — H&P (Addendum)
[facility] History and Physical  Beth Padilla [id number] DOB: 1941/01/09 DOA: 09/14/2013  Referring physician: Dr Wyvonnia Dusky PCP: Audery Amel vasireddy, MD  Chief Complaint:  Shortness of breath for 3-4 days   HPI:  73 year old female with history of diabetes mellitus, hypertension, hypothyroidism, dyslipidemia, anemia, depression and chronic kidney disease Stage II-III, paroxysmal A. fib presented to the ED with progressive shortness of breath for past 4 days. She reports seeing her PCP about 2 weeks back and was started on a new water pill. She was then seen 3 days back in the office where she was found to be short of breath and wheezy and was started on Levaquin for pneumonia (patient denies having a chest x-ray done). She was also told to stop the diuretic at that time as her renal function had worsened . Patient reports having nonproductive cough as well. Patient reports having shortness of breath on minimal exertion initially and at rest since yesterday. She reports some orthopnea but denies any PND. She denies any chest pain, palpitations, dizziness, headache, blurred vision, fever, chills, nausea, vomiting, abdominal pain, bowel or urinary symptoms.  Denies change in weight or appetite. He denies history of CHF. Patient has history of A. fib and was on xarelto which was stopped in September 2014 as she had hemoptysis. .   Course in the ED Patient was afebrile, heart rate was in the 40s and dropped down to 39 and was given 0.5 mg of IV atropine after which it improved to low 50s. Systolic blood pressure was mildly elevated. O2 sat was normal on room air. Blood work showed hemoglobin of 10.2, hematocrit of 31.2. Chemistry showed worsened renal function with BUN of 51 and creatinine of 2.60. Troponin in the ED was negative. Glucose was 153. BNP was markedly elevated to 5200. Chest x-ray done in the ED showed bilateral lower lobe airspace disease concerning for pneumonia versus  pulmonary edema. Small volume pleural effusion was also noted.  EKG showed right axis deviation with a junctional rhythm and sinus bradycardia T wave inversion in inferior leads. Given 20 mg IV Lasix and hospitalists admission requested  Review of Systems:  Constitutional: Denies fever, chills, diaphoresis, appetite change and fatigue.  HEENT: Coarse chronic nasal congestion, denies sore throat,  trouble swallowing, neck pain, n Respiratory:  SOB, DOE, cough, wheezing, denies chest tightness Cardiovascular: Denies chest pain, palpitations and leg swelling.  Gastrointestinal: Denies nausea, vomiting, abdominal pain, diarrhea,  and abdominal distention.  Genitourinary: Denies dysuria, urgency, frequency, hematuria, flank pain and difficulty urinating.  Endocrine: Denies: hot or cold intolerance,  polyuria, polydipsia. Musculoskeletal: Denies myalgias, back pain, joint swelling, arthralgias  Neurological: Reports weakness, Denies dizziness, seizures, syncope,  light-headedness, numbness and headaches.  Psychiatric/Behavioral: Denies  confusion,   Past Medical History  Diagnosis Date  . Diabetes mellitus   . Hypertension   . Hypothyroidism 12/15/2010  . Hyperlipidemia 12/15/2010  . Anemia   . Depression   . Renal disorder    Past Surgical History  Procedure Laterality Date  . Abdominal hysterectomy     Social History:  reports that she has never smoked. She does not have any smokeless tobacco history on file. She reports that she does not drink alcohol or use illicit drugs.  Allergies  Allergen Reactions  . Ampicillin Rash    No family history on file.  Prior to Admission medications   Medication Sig Start Date End Date Taking? Authorizing Provider  albuterol (PROVENTIL HFA;VENTOLIN HFA) 108 (90 BASE) MCG/ACT inhaler Inhale 2  puffs into the lungs 3 (three) times daily as needed. Shortness of breath   Yes Historical Provider, MD  amLODipine (NORVASC) 10 MG tablet Take 10 mg by  mouth daily.   Yes Historical Provider, MD  aspirin 81 MG chewable tablet Chew 81 mg by mouth daily.   Yes Historical Provider, MD  atorvastatin (LIPITOR) 40 MG tablet Take 40 mg by mouth every evening.   Yes Historical Provider, MD  escitalopram (LEXAPRO) 10 MG tablet Take 10 mg by mouth daily.   Yes Historical Provider, MD  furosemide (LASIX) 40 MG tablet Take 40 mg by mouth 2 (two) times daily.   Yes Historical Provider, MD  glipiZIDE (GLUCOTROL) 5 MG tablet Take 5 mg by mouth 2 (two) times daily before a meal.     Yes Historical Provider, MD  hydrALAZINE (APRESOLINE) 100 MG tablet Take 100 mg by mouth 3 (three) times daily.   Yes Historical Provider, MD  insulin glargine (LANTUS) 100 UNIT/ML injection Inject 10 Units into the skin at bedtime.   Yes Historical Provider, MD  levofloxacin (LEVAQUIN) 500 MG tablet Take 500 mg by mouth daily. For 7 days. Starting 8/27   Yes Historical Provider, MD  levothyroxine (SYNTHROID, LEVOTHROID) 100 MCG tablet Take 100 mcg by mouth daily before breakfast.   Yes Historical Provider, MD  metoprolol succinate (TOPROL-XL) 50 MG 24 hr tablet Take 50 mg by mouth daily. Take with or immediately following a meal.   Yes Historical Provider, MD  omeprazole (PRILOSEC) 20 MG capsule Take 20 mg by mouth daily.   Yes Historical Provider, MD  sitaGLIPtin (JANUVIA) 25 MG tablet Take 25 mg by mouth daily.   Yes Historical Provider, MD  torsemide (DEMADEX) 10 MG tablet Take 10 mg by mouth 2 (two) times daily.   Yes Historical Provider, MD     Physical Exam:  Filed Vitals:   09/14/13 1315 09/14/13 1345 09/14/13 1400 09/14/13 1415  BP:   162/46   Pulse: 51 48 47 46  Temp:      TempSrc:      Resp: 13 19 15 17   Height:      Weight:      SpO2: 98% 99% 96% 96%    Constitutional: Vital signs reviewed.  Elderly Female in no acute distress  HEENT: no pallor, no icterus, moist oral mucosa, no JVD Cardiovascular: S1 and S2 bradycardic, no murmur rub or gallop Chest:  Bibasilar fine crackles, scattered rhonchi, no wheezes Abdominal: Soft. Non-tender, non-distended, bowel sounds are normal,  Ext: warm,  trace edema Neurological: Alert and oriented  Labs on Admission:  Basic Metabolic Panel:  Recent Labs Lab 09/14/13 1218  NA 137  K 4.2  CL 99  CO2 21  GLUCOSE 153*  BUN 51*  CREATININE 2.60*  CALCIUM 8.9   Liver Function Tests: No results found for this basename: AST, ALT, ALKPHOS, BILITOT, PROT, ALBUMIN,  in the last 168 hours No results found for this basename: LIPASE, AMYLASE,  in the last 168 hours No results found for this basename: AMMONIA,  in the last 168 hours CBC:  Recent Labs Lab 09/14/13 1218  WBC 8.8  NEUTROABS 7.2  HGB 10.2*  HCT 31.2*  MCV 88.1  PLT 333   Cardiac Enzymes:  Recent Labs Lab 09/14/13 1218  TROPONINI <0.30   BNP: No components found with this basename: POCBNP,  CBG: No results found for this basename: GLUCAP,  in the last 168 hours  Radiological Exams on Admission: Dg Chest 2 View  09/14/2013   CLINICAL DATA:  73 year old female with shortness of breath, recently diagnosed with pneumonia and placed on antibiotics. Lower extremity swelling cough and congestion. Initial encounter.  EXAM: CHEST  2 VIEW  COMPARISON:  09/20/2012 and earlier.  FINDINGS: Confluent lower lobe pulmonary opacity, probably bilateral based on the frontal view. Small volume pleural fluid. No pneumothorax. Upper lobes are spared. Chronic cardiomegaly. Stable mediastinal contours. Visualized tracheal air column is within normal limits. No acute osseous abnormality identified.  IMPRESSION: Bilateral lower lobe airspace disease compatible with pneumonia in the appropriate clinical setting. Small volume pleural effusions.  Chronic cardiomegaly. Asymmetric pulmonary edema is a less likely explanation for the lower lobe findings.  Post treatment radiographs recommended to document resolution.   Electronically Signed   By: Lars Pinks M.D.    On: 09/14/2013 13:42    EKG: Sinus bradycardia at 55, junctional rhythm. T wave inversion in inferior leads  Assessment/Plan   New Onset CHF with exacerbation -Admit to down unit given CHF and bradycardia -Will start on IV Lasix 40 mg twice a day. Patient reportedly was on oral Lasix 40 mg twice a day and switched to  torsemide at home. -Monitor I/O. and daily weights -Check 2D echo. -continue ASA, statin . Hold beta blocker in the setting of bradycardia. (Patient is on Cardizem at home it will be held as well) -Doubt underlying pneumonia. will not treat with antibiotic.  Acute on chronic kidney disease Possibly in the setting of poor po intake vs diuretics. UA suggests UTI Check urine lites and renal ultrasound. Renal function may further worsen with IV diuretic  Bradycardia No clear etiology. Patient is on beta blocker which has been discontinued. Monitor on telemetry. Will request cardiology consult in morning. Avoid beta blocker or calcium channel blocker.  Diabetes mellitus Monitor on sliding scale insulin. Hold Januvia and glipizide  History of asthma Stable. When necessary albuterol neb   Anemia Appears to be chronic. Followup with PCP as outpatient.  Hypothyroidism Continue Synthroid. Check TSH  Dyslipidemia Continue statin  Hypertension Continue  Hydralazine and amlodipine  UTI On empiric Rocephin. Follow culture  Diet:cardiac/diabetic  DVT prophylaxis: sq  heparin   Code Status: full code Family Communication: discussed with daughter at bedside Disposition Plan: Admit to step down  Louellen Molder [facility] Pager [phone]  Total time spent on admission :70 minutes  If 7PM-7AM, please contact night-coverage [email or web address] Password [email or web address] 09/14/2013, 3:11 PM

## 2013-09-15 ENCOUNTER — Inpatient Hospital Stay (HOSPITAL_COMMUNITY): Payer: Medicare Other

## 2013-09-15 ENCOUNTER — Encounter (HOSPITAL_COMMUNITY): Payer: Self-pay | Admitting: Adult Health

## 2013-09-15 DIAGNOSIS — I10 Essential (primary) hypertension: Secondary | ICD-10-CM

## 2013-09-15 DIAGNOSIS — I359 Nonrheumatic aortic valve disorder, unspecified: Secondary | ICD-10-CM

## 2013-09-15 LAB — BASIC METABOLIC PANEL
Anion gap: 14 (ref 5–15)
BUN: 46 mg/dL — AB (ref 6–23)
CHLORIDE: 104 meq/L (ref 96–112)
CO2: 23 mEq/L (ref 19–32)
Calcium: 8.8 mg/dL (ref 8.4–10.5)
Creatinine, Ser: 2.56 mg/dL — ABNORMAL HIGH (ref 0.50–1.10)
GFR calc non Af Amer: 17 mL/min — ABNORMAL LOW (ref >90)
GFR, EST AFRICAN AMERICAN: 20 mL/min — AB (ref >90)
Glucose, Bld: 90 mg/dL (ref 70–99)
POTASSIUM: 4 meq/L (ref 3.7–5.3)
SODIUM: 141 meq/L (ref 137–147)

## 2013-09-15 LAB — GLUCOSE, CAPILLARY
GLUCOSE-CAPILLARY: 72 mg/dL (ref 70–99)
GLUCOSE-CAPILLARY: 138 mg/dL — AB (ref 70–99)
Glucose-Capillary: 118 mg/dL — ABNORMAL HIGH (ref 70–99)
Glucose-Capillary: 115 mg/dL — ABNORMAL HIGH (ref 70–99)

## 2013-09-15 LAB — TROPONIN I: Troponin I: <0.3 ng/mL (ref <0.30)

## 2013-09-15 MED ORDER — LEVOTHYROXINE SODIUM 112 MCG PO TABS
112.0000 ug | ORAL_TABLET | Freq: Every day | ORAL | Status: DC
  Administered 2013-09-16 – 2013-09-17 (×2): 112 ug via ORAL
  Filled 2013-09-15 (×4): qty 1

## 2013-09-15 MED ORDER — ZOLPIDEM TARTRATE 5 MG PO TABS
5.0000 mg | ORAL_TABLET | Freq: Once | ORAL | Status: AC
  Administered 2013-09-15: 5 mg via ORAL
  Filled 2013-09-15: qty 1

## 2013-09-15 NOTE — Care Management Utilization Note (Signed)
UR completed 

## 2013-09-15 NOTE — Progress Notes (Signed)
*  PRELIMINARY RESULTS* Echocardiogram 2D Echocardiogram has been performed.  Beth Padilla 09/15/2013, 10:59 AM

## 2013-09-15 NOTE — Consult Note (Addendum)
CARDIOLOGY CONSULT NOTE   Patient ID: Beth Padilla MRN: [id number] DOB/AGE: 03-08-1940 73 y.o.  Admit Date: 09/14/2013 Referring Physician: PTH Primary Physician: Dairl Ponder, MD Consulting Cardiologist: Carlyle Dolly MD Primary Cardiologist: Darnell Level, [place])  Reason for Consultation: CHF, Bradycardia   Clinical Summary Beth Padilla is a 73 y.o.female no history of "heart murmur", hypertension, diabetes,  Hypothyroidism,hyperlipidemia, who presented with worsening shortness of breath , wheezing and generalized fatigue. She states symptoms began back in July when she states she had received a pneumonia vaccine. A few weeks later, she began to have worsening wheezing, and hypertension with complaints of lower extremity edema. She presented to her primary care physician's office in [place]. She was given lasix.  2 weeks later, symptoms progressed, with worsening breathing , wheezing . She was sent and a chest x-ray. He felt she had pneumonia. However, due to her worsening status. She presented to the emergency room .  On arrival in the emergency room the patient's blood pressure is 166/50, heart rate was 50 , EKG demonstrated questionable accelerated junctional rhythm vs. first degree heart block..She was afebrile. She was admitted for pneumonia, and possible CHF with bradycardia. Renal status demonstrated elevated see her and creatinine of 2.60. BUN 51, GFR 20. Pro-BNP 5, 670. Chest x-ray revealed bilateral lower airspace disease, compatible with pneumonia. Chronic cardiomegaly, asymmetric, pulmonary edema is less likely.   She was treated with DuoNeb nebulizer treatments, and Lasix 20 mg IV x1. She is noted to be very bradycardic on followup vital signs and monitoring dropping heart rate into the 30s and 40s. She was given atropine 0.5 mg with return to normal sinus rhythm in the 50s. TSH was completed with results 5.670. No followup T3 or T4 was ordered.  She is  followed by Dr. Donn Pierini, [facility]. [phone]. She saw him last in March of 2015. She states she had been on Xarelto for Atrial fibrillation during hospitalization in March, but had hemoptysis and was taken off of this.   Allergies  Allergen Reactions  . Ampicillin Rash    Medications Scheduled Medications: . amLODipine  10 mg Oral Daily  . aspirin  81 mg Oral Daily  . atorvastatin  40 mg Oral QPM  . cefTRIAXone (ROCEPHIN)  IV  1 g Intravenous Q24H  . escitalopram  10 mg Oral Daily  . furosemide  40 mg Intravenous BID  . heparin subcutaneous  5,000 Units Subcutaneous 3 times per day  . hydrALAZINE  100 mg Oral TID  . insulin aspart  0-15 Units Subcutaneous TID WC  . insulin glargine  10 Units Subcutaneous QHS  . [START ON 09/16/2013] levothyroxine  112 mcg Oral QAC breakfast  . pantoprazole  40 mg Oral Daily       PRN Medications: albuterol   Past Medical History  Diagnosis Date  . Diabetes mellitus   . Hypertension   . Hypothyroidism 12/15/2010  . Hyperlipidemia 12/15/2010  . Anemia   . Depression   . Renal disorder     Past Surgical History  Procedure Laterality Date  . Abdominal hysterectomy      Family History  Problem Relation Age of Onset  . Stroke Mother   . Cancer Sister     Social History Beth Padilla reports that she has never smoked. She does not have any smokeless tobacco history on file. Beth Padilla reports that she does not drink alcohol.  Review of Systems Otherwise reviewed and negative except as outlined.  Physical  Examination Blood pressure 169/39, pulse 42, temperature 98.5 F (36.9 C), temperature source Oral, resp. rate 21, height 5\' 7"  (1.702 m), weight 205 lb 4 oz (93.1 kg), SpO2 96.00%.  Intake/Output Summary (Last 24 hours) at 09/15/13 0956 Last data filed at 09/15/13 0500  Gross per 24 hour  Intake    650 ml  Output   1850 ml  Net  -1200 ml    Telemetry: Sinus bradycardia HR in the 50's.    GEN: No acute distress HEENT: Conjunctiva and lids normal, oropharynx clear with moist mucosa. Neck: Supple, slightly elevated JVP or carotid bruits, no thyromegaly. Lungs: Clear to auscultation, nonlabored breathing at rest. Cardiac: Regular rate and rhythm, bradycardic 2.6 systolic murmur. no pericardial rub. Abdomen: Soft, nontender, no hepatomegaly, bowel sounds present, no guarding or rebound. Extremities: No pitting edema, distal pulses 2+. Skin: Warm and dry. Musculoskeletal: No kyphosis. Neuropsychiatric: Alert and oriented x3, affect grossly appropriate.  Prior Cardiac Testing/Procedures Per cardiologist in [place]. Will request records.  Lab Results  Basic Metabolic Panel:  Recent Labs Lab 09/14/13 1218 09/15/13 0443  NA 137 141  K 4.2 4.0  CL 99 104  CO2 21 23  GLUCOSE 153* 90  BUN 51* 46*  CREATININE 2.60* 2.56*  CALCIUM 8.9 8.8     CBC:  Recent Labs Lab 09/14/13 1218  WBC 8.8  NEUTROABS 7.2  HGB 10.2*  HCT 31.2*  MCV 88.1  PLT 333    Cardiac Enzymes:  Recent Labs Lab 09/14/13 1218 09/14/13 2206 09/15/13 0443  TROPONINI <0.30 <0.30 <0.30    BNP: 5, 214   Radiology: Dg Chest 2 View  09/14/2013   CLINICAL DATA:  73 year old female with shortness of breath, recently diagnosed with pneumonia and placed on antibiotics. Lower extremity swelling cough and congestion. Initial encounter.  EXAM: CHEST  2 VIEW  COMPARISON:  09/20/2012 and earlier.  FINDINGS: Confluent lower lobe pulmonary opacity, probably bilateral based on the frontal view. Small volume pleural fluid. No pneumothorax. Upper lobes are spared. Chronic cardiomegaly. Stable mediastinal contours. Visualized tracheal air column is within normal limits. No acute osseous abnormality identified.  IMPRESSION: Bilateral lower lobe airspace disease compatible with pneumonia in the appropriate clinical setting. Small volume pleural effusions.  Chronic cardiomegaly. Asymmetric pulmonary edema is  a less likely explanation for the lower lobe findings.  Post treatment radiographs recommended to document resolution.   Electronically Signed   By: Lars Pinks M.D.   On: 09/14/2013 13:42     ECG: Bradycardia, accelerated junctional vs first degress heart block with prolonged Qtc. HR  55 bpm.   Impression and Recommendations  1.Bradycardia:  History of hypothyroidism, with accelerated junctional rhythm. Heart rate decreased to low 40;s in ER requiring atropine. TSH is elevated, despite use of synthroid 100 mcg daily. She denies medical non-compliance.  Will request records from cardiologist in [place] for hx and prior tests. She is on metoprolol 50 mg BID at home. This has been held by [facility] in the setting of bradycardia. Waiting for "wash-out" Troponin negative X 3.   2. CHF: She had elevated Pro-BNP >5,000, and was given IV lasix in ER. She has diuresed 1200 cc since admission, with no LEE or ongoing wheezing at this time.  She remains on BID lasix 40 mg IV, creatinine 2.56 this am. She is normally on lasix 40 mg BID po at home.   3. Hypertension: BP is only moderately controlled. She is on amlodipine 10mg  and Hydralazine 100 mg TID.Will have echo completed  for evaluation of LV fx and diastolic status. Records are being requested from [facility] cardiology. No ACE or ARB due to renal insufficiency. Consider adding nitrates.   4. Hypothyroidism: Contributing to bradycardia.   5. Renal Insufficiency: Compared to admission in Sept of 2014, creatinine has worsened from 1.79 to 2.60 on admission.    Signed: Phill Myron. Erlene Devita NP  09/15/2013, 9:56 AM Co-Sign MD  Attending Note Patient seen and discussed with NP Purcell Nails, I agree with her documentation above. 73 yo female hx of HTN, DM, hypothyroidism, HL admitted with SOB and LE edema. Noted to have sinus arrest with junctional rhythm on EKG rate 40s, she is on Toprol XL 50mg  daily at home which has been held. TSH elevated at 5.6 (she is on  replacement), appears her dose has been increased this admit (100 to 112 mcg). She attributes the swelling to starting after a new bp medicine was started, she is not sure which one.   Evidence of volume overload as well on presentation, echo is pending. She has been started on IV lasix 40mg  bid, negative 1.2 liters since admission. From prior labs Cr 1.8 a year ago, it is unclear if elevated Cr this admit is progression of chronic disease or AKI on CKD, this is being worked up by primary team. Wide pulse pressure noted, likely some age related component. F/u echo results to evaluate for AI in setting of volume overload. In setting of bradycardia and renal disease choices of HTN medications limited, continue hydralazine and norvasc for now.   Of note she has a cardiologist in [place], [place], we have requested records.   Trop neg x4, K 4, Cr 2.56(1.8 1 year ago), GFR 20, BNP 5214, lactic acid 1.4, TSH 5.6,  CXR cardiomegaly, bilateral lower lobe airspace disease likely pneumonia.  EKG sinus arrest with  junctional rhythm rate 46, RAD (09/2012 sinus brady) Echo pending   Continue to follow rates off metoprolol. Will f/u echo results and records from her cardiologist. Pending response she may need consideration for pacemaker, but would give more time off metorpolol for now.   Zandra Abts MD

## 2013-09-15 NOTE — Progress Notes (Signed)
[facility] PROGRESS NOTE  Beth Padilla [id number] DOB: September 17, 1940 DOA: 09/14/2013 PCP: Dairl Ponder, MD Brief narrative 73 year old female with history of diabetes mellitus, hypertension, hypothyroidism, anemia, dyslipidemia, depression, Stage II-III, paroxysmal A. fib presented to the ED with progressive shortness of breath for past 4 days. She reports seeing her PCP about 2 weeks back and was started on a new water pill. She was then seen 3 days back in the office where she was found to be short of breath and wheezy and was started on Levaquin for pneumonia (patient denies having a chest x-ray done). She was also told to stop the diuretic at that time as her renal function had worsened . Patient reports having nonproductive cough as well. She had shortness of breath on minimal exertion initially and at rest since one day prior to admission associated with orthopnea but no PND.  In the ED patient was found to be bradycardic to 39 and given 0.5 mg IV atropine. She was found to have worsening renal function with BUN of 51 and creatinine of 2.6. pro BNP was elevated to 5200. Chest x-ray was suggestive of pulmonary edema. Patient admitted to step down unit.  Assessment/Plan: New Onset CHF with exacerbation  -Continue stepdown monitoring. Continue IV Lasix for diuresis. Negative balance of 1200 cc since admission. - Patient reportedly was on oral Lasix 40 mg twice a day and switched to torsemide at home.  -Monitor I/O. and daily weights  -Check 2D echo.  -continue ASA, statin . holding metoprolol  in the setting of bradycardia.   Sinus Bradycardia No clear etiology except for patient being on metoprolol which has been discontinued. Awaiting records from patient's cardiologist office in [place]. Heart rate in the 40s on telemetry and appeared asymptomatic. Cardiology recommends this likely due to effect of metoprolol which would wear off.  Acute on chronic kidney disease   Possibly in the setting of poor po intake vs diuretics. UA suggests UTI  FeNa >2. Follow renal ultrasound. Monitor renal function daily.   Diabetes mellitus  Monitor on sliding scale insulin. Hold Januvia and glipizide   History of asthma  Stable. When necessary albuterol neb   Anemia  Appears to be chronic. Followup with PCP as outpatient.   Hypothyroidism  Elevated TSH noted. Synthroid dose increased to 112 mcg  Dyslipidemia  Continue statin   Hypertension  Continue Hydralazine and amlodipine   UTI  On empiric Rocephin. Follow culture   Depression Patient reports feeling low due to some financial stress but denies any suicidal ideations. Continue Lexapro. Recommend outpatient psychiatric referral.  Diet:cardiac/diabetic   DVT prophylaxis: sq heparin   Code Status: full code  Family Communication: None at bedside  Disposition Plan: Continue stepdown monitoring. Home once stable     HPI/Subjective: Patient seen and examined this morning he reports completing to be better today. Denies any chest pain or palpitations.  Objective: Filed Vitals:   09/15/13 1204  BP:   Pulse:   Temp: 98.5 F (36.9 C)  Resp:     Intake/Output Summary (Last 24 hours) at 09/15/13 1345 Last data filed at 09/15/13 0500  Gross per 24 hour  Intake    650 ml  Output   1850 ml  Net  -1200 ml   Filed Weights   09/14/13 1204 09/15/13 0500  Weight: 95.709 kg (211 lb) 93.1 kg (205 lb 4 oz)    Exam: Gen.: Elderly female in no acute distress, flat affect HEENT: no pallor, no icterus, moist oral mucosa,  Cardiovascular: S1 and S2 bradycardic, no murmur rub or gallop  Chest: Bibasilar fine crackles, no rhonchi, no wheezes  Abdominal: Soft. Non-tender, non-distended, bowel sounds are normal,  Ext: warm, trace edema Neurological: Alert and oriented   Data Reviewed: Basic Metabolic Panel:  Recent Labs Lab 09/14/13 1218 09/15/13 0443  NA 137 141  K 4.2 4.0  CL 99 104  CO2  21 23  GLUCOSE 153* 90  BUN 51* 46*  CREATININE 2.60* 2.56*  CALCIUM 8.9 8.8   Liver Function Tests: No results found for this basename: AST, ALT, ALKPHOS, BILITOT, PROT, ALBUMIN,  in the last 168 hours No results found for this basename: LIPASE, AMYLASE,  in the last 168 hours No results found for this basename: AMMONIA,  in the last 168 hours CBC:  Recent Labs Lab 09/14/13 1218  WBC 8.8  NEUTROABS 7.2  HGB 10.2*  HCT 31.2*  MCV 88.1  PLT 333   Cardiac Enzymes:  Recent Labs Lab 09/14/13 1218 09/14/13 2206 09/15/13 0443 09/15/13 1013  TROPONINI <0.30 <0.30 <0.30 <0.30   BNP (last 3 results)  Recent Labs  09/20/12 1550 09/14/13 1218  PROBNP 1520.0* 5214.0*   CBG:  Recent Labs Lab 09/14/13 1647 09/14/13 2058 09/15/13 0737 09/15/13 1137  GLUCAP 139* 178* 72 118*    Recent Results (from the past 240 hour(s))  MRSA PCR SCREENING     Status: None   Collection Time    09/14/13  3:10 PM      Result Value Ref Range Status   MRSA by PCR NEGATIVE  NEGATIVE Final   Comment:            The GeneXpert MRSA Assay (FDA     approved for NASAL specimens     only), is one component of a     comprehensive MRSA colonization     surveillance program. It is not     intended to diagnose MRSA     infection nor to guide or     monitor treatment for     MRSA infections.     Studies: Dg Chest 2 View  09/14/2013   CLINICAL DATA:  73 year old female with shortness of breath, recently diagnosed with pneumonia and placed on antibiotics. Lower extremity swelling cough and congestion. Initial encounter.  EXAM: CHEST  2 VIEW  COMPARISON:  09/20/2012 and earlier.  FINDINGS: Confluent lower lobe pulmonary opacity, probably bilateral based on the frontal view. Small volume pleural fluid. No pneumothorax. Upper lobes are spared. Chronic cardiomegaly. Stable mediastinal contours. Visualized tracheal air column is within normal limits. No acute osseous abnormality identified.   IMPRESSION: Bilateral lower lobe airspace disease compatible with pneumonia in the appropriate clinical setting. Small volume pleural effusions.  Chronic cardiomegaly. Asymmetric pulmonary edema is a less likely explanation for the lower lobe findings.  Post treatment radiographs recommended to document resolution.   Electronically Signed   By: Lars Pinks M.D.   On: 09/14/2013 13:42    Scheduled Meds: . amLODipine  10 mg Oral Daily  . aspirin  81 mg Oral Daily  . atorvastatin  40 mg Oral QPM  . cefTRIAXone (ROCEPHIN)  IV  1 g Intravenous Q24H  . escitalopram  10 mg Oral Daily  . furosemide  40 mg Intravenous BID  . heparin subcutaneous  5,000 Units Subcutaneous 3 times per day  . hydrALAZINE  100 mg Oral TID  . insulin aspart  0-15 Units Subcutaneous TID WC  . insulin glargine  10 Units Subcutaneous QHS  . [  START ON 09/16/2013] levothyroxine  112 mcg Oral QAC breakfast  . pantoprazole  40 mg Oral Daily   Continuous Infusions:     Time spent: [place], Mirta Mally  [facility] Pager [phone] If 7PM-7AM, please contact night-coverage at [email or web address], password [facility] 09/15/2013, 1:45 PM  LOS: 1 day

## 2013-09-16 DIAGNOSIS — N179 Acute kidney failure, unspecified: Secondary | ICD-10-CM | POA: Diagnosis present

## 2013-09-16 DIAGNOSIS — N183 Chronic kidney disease, stage 3 unspecified: Secondary | ICD-10-CM

## 2013-09-16 LAB — GLUCOSE, CAPILLARY
GLUCOSE-CAPILLARY: 83 mg/dL (ref 70–99)
GLUCOSE-CAPILLARY: 128 mg/dL — AB (ref 70–99)
Glucose-Capillary: 117 mg/dL — ABNORMAL HIGH (ref 70–99)
Glucose-Capillary: 168 mg/dL — ABNORMAL HIGH (ref 70–99)

## 2013-09-16 LAB — URINE CULTURE
COLONY COUNT: NO GROWTH
CULTURE: NO GROWTH

## 2013-09-16 LAB — BASIC METABOLIC PANEL
ANION GAP: 14 (ref 5–15)
BUN: 41 mg/dL — ABNORMAL HIGH (ref 6–23)
CO2: 25 meq/L (ref 19–32)
Calcium: 8.5 mg/dL (ref 8.4–10.5)
Chloride: 103 mEq/L (ref 96–112)
Creatinine, Ser: 2.59 mg/dL — ABNORMAL HIGH (ref 0.50–1.10)
GFR calc non Af Amer: 17 mL/min — ABNORMAL LOW (ref >90)
GFR, EST AFRICAN AMERICAN: 20 mL/min — AB (ref >90)
Glucose, Bld: 91 mg/dL (ref 70–99)
POTASSIUM: 4 meq/L (ref 3.7–5.3)
Sodium: 142 mEq/L (ref 137–147)

## 2013-09-16 MED ORDER — FUROSEMIDE 40 MG PO TABS
60.0000 mg | ORAL_TABLET | Freq: Two times a day (BID) | ORAL | Status: DC
  Administered 2013-09-16 – 2013-09-17 (×2): 60 mg via ORAL
  Filled 2013-09-16 (×4): qty 1

## 2013-09-16 MED ORDER — ISOSORBIDE DINITRATE 20 MG PO TABS
10.0000 mg | ORAL_TABLET | Freq: Two times a day (BID) | ORAL | Status: DC
  Administered 2013-09-16 (×2): 10 mg via ORAL
  Filled 2013-09-16 (×2): qty 1

## 2013-09-16 NOTE — Care Management Note (Addendum)
    Page 1 of 1   09/17/2013     11:56:17 AM CARE MANAGEMENT NOTE 09/17/2013  Patient:  Beth Padilla, Beth Padilla   Account Number:  [id number]  Date Initiated:  09/16/2013  Documentation initiated by:  Theophilus Kinds  Subjective/Objective Assessment:   Pt admitted from home with CHF. Pt lives with her son and will return home at discharge. Pt is fairly independent with ADL's. Pt has a cane for home use. Pt also has home O2 with [employer] in [place].     Action/Plan:   No CM needs anticipated.   Anticipated DC Date:  09/18/2013   Anticipated DC Plan:  [place]  CM consult      Choice offered to / List presented to:             Status of service:  Completed, signed off Medicare Important Message given?  YES (If response is "NO", the following Medicare IM given date fields will be blank) Date Medicare IM given:  09/17/2013 Medicare IM given by:  Jolene Provost Date Additional Medicare IM given:   Additional Medicare IM given by:    Discharge Disposition:  HOME/SELF CARE  Per UR Regulation:    If discussed at Long Length of Stay Meetings, dates discussed:    Comments:  09/17/2013 [place], RN, MSN, PCCN Pt plans to discharge home with self care today. Pt has requested information on PCP's in the [place] area. Pt given a list of PCP's accepting new pt's in [place] and the information for the [facility]. Pt will choose PCP to contact and made appointment with. Pt has no further CM needs at this time. 09/16/13 [place], RN BSN CM

## 2013-09-16 NOTE — Progress Notes (Signed)
Patient transferred to room 322. Report given to Leroy Kennedy RN. Vital signs stable at transfer.

## 2013-09-16 NOTE — Clinical Documentation Improvement (Signed)
New onset CHF documented in progress note.  2D echo completed 8/31.  Please provide greater specificity of the CHF.    Possible Clinical Conditions:  -Acute Systolic Congestive Heart Failure -Acute Diastolic Congestive Heart Failure -Acute Systolic & Diastolic Congestive Heart Failure -Other Condition -Cannot Clinically Determine  Thank You, Gatha Mayer ,RN Clinical Documentation Specialist:    [place] Information Management

## 2013-09-16 NOTE — Progress Notes (Addendum)
Consulting cardiologist:Branch, Roderic Palau Primary Cardiologist:Zagol, Aaron Edelman MD [facility])  Subjective:    No complaints of chest pain, dizziness or shortness of breath. Feeling better.  Objective:   Temp:  [97.6 F (36.4 C)-98.5 F (36.9 C)] 97.6 F (36.4 C) (09/01 0400) Pulse Rate:  [41-53] 45 (09/01 0500) Resp:  [13-24] 21 (09/01 0500) BP: (132-173)/(25-64) 150/60 mmHg (09/01 0500) SpO2:  [92 %-100 %] 98 % (09/01 0500) Weight:  [205 lb 7.5 oz (93.2 kg)] 205 lb 7.5 oz (93.2 kg) (09/01 0500) Last BM Date: 09/14/13  Filed Weights   09/14/13 1204 09/15/13 0500 09/16/13 0500  Weight: 211 lb (95.709 kg) 205 lb 4 oz (93.1 kg) 205 lb 7.5 oz (93.2 kg)    Intake/Output Summary (Last 24 hours) at 09/16/13 0757 Last data filed at 09/16/13 0713  Gross per 24 hour  Intake      0 ml  Output   1000 ml  Net  -1000 ml    Telemetry: Atrial fib with slow ventricular response.  Exam:  General: No acute distress.  HEENT: Conjunctiva and lids normal, oropharynx clear.  Lungs: Some crackles in the bases, but no wheezes or coughing.  Cardiac: No elevated JVP or bruits. RRR,bradycardic, 2/6 holosystolic murmur,  no gallop or rub.   Abdomen: Normoactive bowel sounds, nontender, nondistended.  Extremities: No pitting edema, distal pulses full.  Neuropsychiatric: Alert and oriented x3, affect appropriate.   Lab Results:  Basic Metabolic Panel:  Recent Labs Lab 09/14/13 1218 09/15/13 0443 09/16/13 0424  NA 137 141 142  K 4.2 4.0 4.0  CL 99 104 103  CO2 21 23 25   GLUCOSE 153* 90 91  BUN 51* 46* 41*  CREATININE 2.60* 2.56* 2.59*  CALCIUM 8.9 8.8 8.5    CBC:  Recent Labs Lab 09/14/13 1218  WBC 8.8  HGB 10.2*  HCT 31.2*  MCV 88.1  PLT 333    Cardiac Enzymes:  Recent Labs Lab 09/14/13 2206 09/15/13 0443 09/15/13 1013  TROPONINI <0.30 <0.30 <0.30    BNP:  Recent Labs  09/20/12 1550 09/14/13 1218  PROBNP 1520.0* 5214.0*     Coagulation:  Recent Labs Lab 09/14/13 1218  INR 1.15   Echocardiogram: 09/15/2013 Left ventricle: The cavity size was normal. Wall thickness was increased in a pattern of mild LVH. Systolic function was normal. The estimated ejection fraction was in the range of 60% to 65%. - Aortic valve: Mildly calcified annulus. Trileaflet; moderately thickened leaflets. There was mild stenosis. There was mild to moderate regurgitation. Mean gradient (S): 17 mm Hg. Valve area (VTI): 1.64 cm^2. Valve area (Vmax): 1.69 cm^2. - Aorta: Aortic root dimension: 25 mm (ED). - Aortic root: The aortic root was normal in size. - Mitral valve: Mildly calcified annulus. Mildly thickened leaflets . - Left atrium: The atrium was severely dilated. - Right atrium: The atrium was mildly dilated. - Systemic veins: The IVC is dilated, poor visualization limits respiratory response. RA pressure is at least 8 mmHg.   Radiology: Dg Chest 2 View  09/14/2013   CLINICAL DATA:  73 year old female with shortness of breath, recently diagnosed with pneumonia and placed on antibiotics. Lower extremity swelling cough and congestion. Initial encounter.  EXAM: CHEST  2 VIEW  COMPARISON:  09/20/2012 and earlier.  FINDINGS: Confluent lower lobe pulmonary opacity, probably bilateral based on the frontal view. Small volume pleural fluid. No pneumothorax. Upper lobes are spared. Chronic cardiomegaly. Stable mediastinal contours. Visualized tracheal air column is within normal limits. No acute osseous  abnormality identified.  IMPRESSION: Bilateral lower lobe airspace disease compatible with pneumonia in the appropriate clinical setting. Small volume pleural effusions.  Chronic cardiomegaly. Asymmetric pulmonary edema is a less likely explanation for the lower lobe findings.  Post treatment radiographs recommended to document resolution.   Electronically Signed   By: Lars Pinks M.D.   On: 09/14/2013 13:42   US Renal  09/15/2013    CLINICAL DATA:  Acute renal injury  EXAM: RENAL/URINARY TRACT ULTRASOUND COMPLETE  COMPARISON:  None.  FINDINGS: Right Kidney:  Length: 10.2 cm. Multiple small cysts are noted. The largest of these measures 12 mm.  Left Kidney:  Length: 10.5 cm. Echogenicity within normal limits. No mass or hydronephrosis visualized.  Bladder:  Appears normal for degree of bladder distention.  IMPRESSION: Right renal cyst.  No acute abnormality is noted.   Electronically Signed   By: Inez Catalina M.D.   On: 09/15/2013 15:56      Medications:   Scheduled Medications: . amLODipine  10 mg Oral Daily  . aspirin  81 mg Oral Daily  . atorvastatin  40 mg Oral QPM  . cefTRIAXone (ROCEPHIN)  IV  1 g Intravenous Q24H  . escitalopram  10 mg Oral Daily  . furosemide  40 mg Intravenous BID  . heparin subcutaneous  5,000 Units Subcutaneous 3 times per day  . hydrALAZINE  100 mg Oral TID  . insulin aspart  0-15 Units Subcutaneous TID WC  . insulin glargine  10 Units Subcutaneous QHS  . levothyroxine  112 mcg Oral QAC breakfast  . pantoprazole  40 mg Oral Daily       PRN Medications: albuterol   Assessment and Plan:   1. Atrial fib with slow ventricular response: Echo demonstrates severely dilated LA. HR in the 40-50's per telemetry. She is off of BB. Still waiting for records from [facility] cardiologist. CHADS Vasc Score of 5. Did not tolerate Xarelto in the past secondary to hemoptysis per patient.  Hypothyroidism may be contributing to HR as well.   2. CHF: Improvement in overall symptoms. She has diuresed another 2 liters. Weight is down 6 lbs. No overt edema Normal EF per echo. Creatinine still elevated at 2.56.   3. Hypertension: Only moderately controlled. On hydralazine 100 mg BID. Will add low dose nitrates. No ACE or ARB with renal insufficiency. Continues on amlodipine 10 mg daily.   4. Hypothyroidism: TSH was elevated with adjustment of synthroid to 112 mcg per PTH.  5. Diabetes: Per PTH   Phill Myron. Lawrence NP  09/16/2013, 7:57 AM  Attending Note Patient seen and discussed with NP Purcell Nails. We are still awaiting records from her primary cardiologist. Tele has shown junctional rhythm as well as afib with slow ventricular response, she has been off Toprol since admission. Noted to be hypothyroid, synthroid increased. Echo yesterday with LVEF 60-65%, indeterminant diastolic function but with severe LAE likely abnormal, IVC with estimated RA pressure of 8 mmHg. She presented volume overloaded with acute diastolic heart failure, and she is negative 450 mL yesterday, negative 2.1 liters since admission. She reports her SOB is much improved. Baseline CKD with stable renal function since admission in setting of diuresis. Wt is down 6 lbs since admission according to charting. Will change to oral lasix today. Her heart rates have improved from the low 40s to mid to high 50s, bps are elevated. She denies any lightheadedness, dizziness, presyncope or generalized fatigue. I am not sure how long bradycardias has been a problem  for her, awaiting records. Will have her ambulate with nursing staff today to monitor symptoms, O2 sats, and heart rate response. From her report known history of afib, had bleeding on anticoag and was stopped.    Zandra Abts MD  Addendum 200pm Records from her cardiologist received. From notes she has known history of PAF, sick sinus syndrome, and aortic valve disease. From recent clinic notes her conduction disease has been asympotmatic. She developed profound hemoptysis on xarelto and has not been on anticoag since. From prior heart monitor noted sinus brady in 40s at night, baseline heart rates in 50s.    Zandra Abts MD

## 2013-09-16 NOTE — Clinical Documentation Improvement (Addendum)
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  Please clarify clinical condition associated with "acute on chronic kidney disease" and document in progress note and discharge summary.  Possible Clinical Conditions: -Acute kidney injury on chronic kidney disease stage II -Acute kidney injury on chronic kidney disease stage III -Acute renal failure on chronic kidney disease stage II -Acute renal failure on chronic kidney disease stage III -Other condition -Not clinically significant  Thank you, Mateo Flow, RN [phone] Clinical Documentation Specialist

## 2013-09-16 NOTE — Progress Notes (Addendum)
[facility] PROGRESS NOTE  Beth Padilla [id number] DOB: 12-27-1940 DOA: 09/14/2013 PCP: Kendrick Ranch, MD   Brief narrative 73 year old female with history of diabetes mellitus, hypertension, hypothyroidism, anemia, dyslipidemia, depression, Stage II-III, paroxysmal A. fib presented to the ED with progressive shortness of breath for past 4 days. She reports seeing her PCP about 2 weeks back and was started on a new water pill. She was then seen 3 days back in the office where she was found to be short of breath and wheezy and was started on Levaquin for pneumonia (patient denies having a chest x-ray done). She was also told to stop the diuretic at that time as her renal function had worsened . Patient reports having nonproductive cough as well. She had shortness of breath on minimal exertion initially and at rest since one day prior to admission associated with orthopnea but no PND.  In the ED patient was found to be bradycardic to 39 and given 0.5 mg IV atropine. She was found to have worsening renal function with BUN of 51 and creatinine of 2.6. pro BNP was elevated to 5200. Chest x-ray was suggestive of pulmonary edema. Patient admitted to step down unit.  Assessment/Plan: New Onset CHF with exacerbation  -symptoms improved with  IV Lasix for diuresis. Negative balance of 2 L. Switched to oral lasix. - patient on torsemide at home and was added oral lasix for CHF like symptoms by her PCP recently -Monitor I/O. and daily weights  - 2D echo wIth normal EF but severely dilated LA and IVC.  -continue ASA, statin . holding metoprolol  in the setting of bradycardia.   Sinus Bradycardia No clear etiology except for patient being on metoprolol which has been discontinued. Awaiting records from patient's cardiologist office in [place]. Heart rate improved from 40s to 50s and  asymptomatic.  cardiology following.  Acute on chronic kidney disease  Spoke with PCP her  baseline creatinine is 2-2.5 and sees nephrology as outpt Possibly in the setting of poor po intake vs diuretics. UA suggests UTI  FeNa >2. renal ultrasound unremarkable.  Renal fn should improve after transition to oral lasix. Monitor in am    Diabetes mellitus  Monitor on sliding scale insulin. Hold Januvia and glipizide   History of asthma  Stable. When necessary albuterol neb   Anemia  Appears to be chronic. Followup with PCP as outpatient.   Hypothyroidism  Elevated TSH noted. Synthroid dose increased to 112 mcg  Dyslipidemia  Continue statin   Hypertension  Continue Hydralazine and amlodipine . As per PCP her BP has been uncontrolled as outpt.  UTI  On empiric Rocephin. cultures negative. Will d/c abx after today's dose ( 3 days)  Depression Patient reports feeling low due to some financial stress but denies any suicidal ideations. Continue Lexapro. Recommend outpatient psychiatric referral.  Diet:cardiac/diabetic    Code Status: full code  Family Communication: None at bedside  Disposition Plan: transfer to tele Discussed with PCP on 09/16/2013     HPI/Subjective: Patient seen and examined. Feels much better. Reports improved mood  Objective: Filed Vitals:   09/16/13 0800  BP: 161/46  Pulse: 45  Temp: 98.2 F (36.8 C)  Resp: 12    Intake/Output Summary (Last 24 hours) at 09/16/13 1337 Last data filed at 09/16/13 0713  Gross per 24 hour  Intake     50 ml  Output   1000 ml  Net   -950 ml   Filed Weights   09/14/13 1204  09/15/13 0500 09/16/13 0500  Weight: 95.709 kg (211 lb) 93.1 kg (205 lb 4 oz) 93.2 kg (205 lb 7.5 oz)    Exam: Gen.: Elderly female in no acute distress HEENT: no pallor, no icterus, moist oral mucosa,  Cardiovascular: S1 and S2 bradycardic, 2/6 systolic murmur,, no rub or gallop  Chest: Bibasilar fine crackles, no rhonchi, no wheezes  Abdominal: Soft. Non-tender, non-distended, bowel sounds are normal,  Ext: warm, no  edema Neurological: Alert and oriented   Data Reviewed: Basic Metabolic Panel:  Recent Labs Lab 09/14/13 1218 09/15/13 0443 09/16/13 0424  NA 137 141 142  K 4.2 4.0 4.0  CL 99 104 103  CO2 21 23 25   GLUCOSE 153* 90 91  BUN 51* 46* 41*  CREATININE 2.60* 2.56* 2.59*  CALCIUM 8.9 8.8 8.5   Liver Function Tests: No results found for this basename: AST, ALT, ALKPHOS, BILITOT, PROT, ALBUMIN,  in the last 168 hours No results found for this basename: LIPASE, AMYLASE,  in the last 168 hours No results found for this basename: AMMONIA,  in the last 168 hours CBC:  Recent Labs Lab 09/14/13 1218  WBC 8.8  NEUTROABS 7.2  HGB 10.2*  HCT 31.2*  MCV 88.1  PLT 333   Cardiac Enzymes:  Recent Labs Lab 09/14/13 1218 09/14/13 2206 09/15/13 0443 09/15/13 1013  TROPONINI <0.30 <0.30 <0.30 <0.30   BNP (last 3 results)  Recent Labs  09/20/12 1550 09/14/13 1218  PROBNP 1520.0* 5214.0*   CBG:  Recent Labs Lab 09/15/13 1137 09/15/13 1655 09/15/13 2120 09/16/13 0723 09/16/13 1112  GLUCAP 118* 138* 115* 83 128*    Recent Results (from the past 240 hour(s))  MRSA PCR SCREENING     Status: None   Collection Time    09/14/13  3:10 PM      Result Value Ref Range Status   MRSA by PCR NEGATIVE  NEGATIVE Final   Comment:            The GeneXpert MRSA Assay (FDA     approved for NASAL specimens     only), is one component of a     comprehensive MRSA colonization     surveillance program. It is not     intended to diagnose MRSA     infection nor to guide or     monitor treatment for     MRSA infections.  URINE CULTURE     Status: None   Collection Time    09/14/13  4:10 PM      Result Value Ref Range Status   Specimen Description URINE, CLEAN CATCH   Final   Special Requests NONE   Final   Culture  Setup Time     Final   Value: 09/15/2013 14:19     Performed at [place]     Final   Value: NO GROWTH     Performed at [facility]   Culture     Final   Value: NO GROWTH     Performed at [facility]   Report Status 09/16/2013 FINAL   Final     Studies: US Renal  09/15/2013   CLINICAL DATA:  Acute renal injury  EXAM: RENAL/URINARY TRACT ULTRASOUND COMPLETE  COMPARISON:  None.  FINDINGS: Right Kidney:  Length: 10.2 cm. Multiple small cysts are noted. The largest of these measures 12 mm.  Left Kidney:  Length: 10.5 cm. Echogenicity within normal limits. No mass or  hydronephrosis visualized.  Bladder:  Appears normal for degree of bladder distention.  IMPRESSION: Right renal cyst.  No acute abnormality is noted.   Electronically Signed   By: Inez Catalina M.D.   On: 09/15/2013 15:56    Scheduled Meds: . amLODipine  10 mg Oral Daily  . aspirin  81 mg Oral Daily  . atorvastatin  40 mg Oral QPM  . cefTRIAXone (ROCEPHIN)  IV  1 g Intravenous Q24H  . escitalopram  10 mg Oral Daily  . furosemide  60 mg Oral BID  . heparin subcutaneous  5,000 Units Subcutaneous 3 times per day  . hydrALAZINE  100 mg Oral TID  . insulin aspart  0-15 Units Subcutaneous TID WC  . insulin glargine  10 Units Subcutaneous QHS  . isosorbide dinitrate  10 mg Oral BID  . levothyroxine  112 mcg Oral QAC breakfast  . pantoprazole  40 mg Oral Daily   Continuous Infusions:     Time spent: [place] [place] [facility] Pager [phone] If 7PM-7AM, please contact night-coverage at [email or web address], password [facility] 09/16/2013, 1:37 PM  LOS: 2 days

## 2013-09-17 LAB — GLUCOSE, CAPILLARY
Glucose-Capillary: 99 mg/dL (ref 70–99)
Glucose-Capillary: 150 mg/dL — ABNORMAL HIGH (ref 70–99)

## 2013-09-17 LAB — BASIC METABOLIC PANEL
Anion gap: 13 (ref 5–15)
BUN: 36 mg/dL — AB (ref 6–23)
CALCIUM: 8.4 mg/dL (ref 8.4–10.5)
CO2: 25 mEq/L (ref 19–32)
Chloride: 105 mEq/L (ref 96–112)
Creatinine, Ser: 2.29 mg/dL — ABNORMAL HIGH (ref 0.50–1.10)
GFR calc Af Amer: 23 mL/min — ABNORMAL LOW (ref >90)
GFR, EST NON AFRICAN AMERICAN: 20 mL/min — AB (ref >90)
GLUCOSE: 98 mg/dL (ref 70–99)
Potassium: 4 mEq/L (ref 3.7–5.3)
Sodium: 143 mEq/L (ref 137–147)

## 2013-09-17 MED ORDER — DOXAZOSIN MESYLATE 1 MG PO TABS: 1.0000 mg | ORAL_TABLET | Freq: Every day | ORAL | Status: DC

## 2013-09-17 MED ORDER — FUROSEMIDE 40 MG PO TABS: 40.0000 mg | ORAL_TABLET | Freq: Two times a day (BID) | ORAL | Status: DC

## 2013-09-17 MED ORDER — DOXAZOSIN MESYLATE 2 MG PO TABS
1.0000 mg | ORAL_TABLET | Freq: Every day | ORAL | Status: DC
  Administered 2013-09-17: 1 mg via ORAL
  Filled 2013-09-17: qty 1

## 2013-09-17 NOTE — Progress Notes (Signed)
Patient ID: JARAH ROSS, female   DOB: 1940-09-05, 73 y.o.   MRN: [id number]    Subjective:    SOB improved  Objective:   Temp:  [98.5 F (36.9 C)-98.9 F (37.2 C)] 98.9 F (37.2 C) (09/02 0556) Pulse Rate:  [51] 51 (09/02 0556) Resp:  [16] 16 (09/02 0556) BP: (159-176)/(42-44) 159/42 mmHg (09/02 0556) SpO2:  [96 %-99 %] 99 % (09/02 0556) Weight:  [198 lb (89.812 kg)] 198 lb (89.812 kg) (09/02 0500) Last BM Date: 09/16/13  Filed Weights   09/15/13 0500 09/16/13 0500 09/17/13 0500  Weight: 205 lb 4 oz (93.1 kg) 205 lb 7.5 oz (93.2 kg) 198 lb (89.812 kg)    Intake/Output Summary (Last 24 hours) at 09/17/13 0852 Last data filed at 09/16/13 1823  Gross per 24 hour  Intake     50 ml  Output      0 ml  Net     50 ml    Telemetry: sius bradycardia  Exam:  General: NAD  Resp: CTAB  Cardiac: RRR, 2/6 systolic murmur RUSB no JVD  GI: abdomen soft, NT, ND  MSK: no LE edema  Neuro: no focal deficits   Lab Results:  Basic Metabolic Panel:  Recent Labs Lab 09/15/13 0443 09/16/13 0424 09/17/13 0617  NA 141 142 143  K 4.0 4.0 4.0  CL 104 103 105  CO2 23 25 25   GLUCOSE 90 91 98  BUN 46* 41* 36*  CREATININE 2.56* 2.59* 2.29*  CALCIUM 8.8 8.5 8.4    Liver Function Tests: No results found for this basename: AST, ALT, ALKPHOS, BILITOT, PROT, ALBUMIN,  in the last 168 hours  CBC:  Recent Labs Lab 09/14/13 1218  WBC 8.8  HGB 10.2*  HCT 31.2*  MCV 88.1  PLT 333    Cardiac Enzymes:  Recent Labs Lab 09/14/13 2206 09/15/13 0443 09/15/13 1013  TROPONINI <0.30 <0.30 <0.30    BNP:  Recent Labs  09/20/12 1550 09/14/13 1218  PROBNP 1520.0* 5214.0*    Coagulation:  Recent Labs Lab 09/14/13 1218  INR 1.15    ECG:   Medications:   Scheduled Medications: . amLODipine  10 mg Oral Daily  . aspirin  81 mg Oral Daily  . atorvastatin  40 mg Oral QPM  . cefTRIAXone (ROCEPHIN)  IV  1 g Intravenous Q24H  . escitalopram  10 mg Oral Daily   . furosemide  60 mg Oral BID  . heparin subcutaneous  5,000 Units Subcutaneous 3 times per day  . hydrALAZINE  100 mg Oral TID  . insulin aspart  0-15 Units Subcutaneous TID WC  . insulin glargine  10 Units Subcutaneous QHS  . isosorbide dinitrate  10 mg Oral BID  . levothyroxine  112 mcg Oral QAC breakfast  . pantoprazole  40 mg Oral Daily     Infusions:     PRN Medications:  albuterol     Assessment/Plan   1. Afib - records from primary cardiologist obtained, she has prior history of afib. Previously had severe hemoptysis on xarelto, and has been on ASA since. She has been followed by pulmonary as well - presented with significant bradycardia, junctional rhythm as well as afib with slow ventricular response. Her metoprolol has been stopped, no noted tachycarrhythmias since admission.Tele this AM shows sinus bradycardia high 50s - continue ASA, will not restart beta blocker  2. Bradycardia - from obtained records this is a chronic problem and has been followed closely by her primary cardiologist. Prior  monitor at home with sinus brady in 40s - rates have improved off metoprolol, her hypothyroidism likely is also playing a role, her synthroid has been increased - she denies any significant symptoms. Does not appear she was ambulated yesterday to document heart rates as ordered. Will order again today to evaluate chronotropic competence and symptoms.  - off metoprolol her SA node has kicked back in, out of junctional rhythm, now sinus brady high 50s which is her baseline according to notes - will need to be continued to be followed by her outpatient cardiologist, at some point may need pacer  3. Acute diastolic heart failure - negative 2.1 liters since admission, breathing has improved - would discharge on lasix 40mg  bid with close follow up with her primary cardiologist  4. HTN - elevated blood pressures, limited options given her CKD and bradycardia - continue hydral,  norvasc. Stop nitrate. From last cardio note considering possible alpha blocker if bp remained elevated, this is likely the best option all things considered - will start cardura 1mg  po qday, can be further titrated as outpatient  5. Hypothyroidism  - synthroid has been increased this admission    If does ok with ambulation fine for discharge home today. She will need to contact her cardiologist for f/u within 2 weeks.          Carlyle Dolly, M.D., F.A.C.C.

## 2013-09-17 NOTE — Progress Notes (Signed)
Pt ambulated length of hallway and back to room. Pt HR-65 and O2Sat-99% while sitting. While walking pt HR varied between 75-85, O2Sat varied between 96%-100%. Pt denies SOB while walking and able to carry a conversation while walking. Pt maintained steady gait with her cane.

## 2013-09-17 NOTE — Progress Notes (Signed)
UR review complete.  

## 2013-09-17 NOTE — Progress Notes (Signed)
Pt discharged home today per Dr. Verlon Au. Pt's IV site D/C'd and WDL. Pt's VSS. Pt provided with home medication list, discharge instructions and scripts.  Verbalized understanding. Pt left floor via WC in stable condition accompanied by NT.

## 2013-09-17 NOTE — Discharge Summary (Signed)
Physician Discharge Summary  Beth Padilla [id number] DOB: 01-May-1940 DOA: 09/14/2013  PCP: Kendrick Ranch, MD  Admit date: 09/14/2013 Discharge date: 09/17/2013  Time spent: 35 minutes  Recommendations for Outpatient Follow-up:  1. Recommend Chem-7 + CBC one week 2. Recommend daily weights and extra dose of Lasix as an outpatient if gains more than 2-3 pounds in 24 hour period of time 3. Recommend close followup with cardiology as an outpatient-may need to titrate alpha blocker  Discharge Diagnoses:  Principal Problem:   CHF (congestive heart failure) Active Problems:   DM type 2 (diabetes mellitus, type 2)   HTN (hypertension)   Anemia   Hypothyroidism   Bradycardia   UTI (urinary tract infection)   Acute renal failure superimposed on stage 3 chronic kidney disease   Discharge Condition: Stable  Diet recommendation: Heart healthy  Filed Weights   09/15/13 0500 09/16/13 0500 09/17/13 0500  Weight: 93.1 kg (205 lb 4 oz) 93.2 kg (205 lb 7.5 oz) 89.812 kg (198 lb)    History of present illness:  73 year old female, known history DM, HTN, hypothyroid, HLD, anemia, depression, CKD 2-3, paroxysmal A. fib not on anticoagulation secondary to hemoptysis admitted 09/14/13 with shortness of breath, productive cough.  This was initially treated in the outpatient setting with by mouth Levaquin  Hospital Course:  New Onset CHF with exacerbation  -symptoms improved with IV Lasix for diuresis. Negative balance of 2.28 L. Switched to oral lasix 40 twice a day on discharge - patient on torsemide at home  - 2D echo wIth normal EF but severely dilated LA and IVC.  -continue ASA, statin . holding metoprolol in the setting of bradycardia.  Sinus Bradycardia  No clear etiology except for patient being on metoprolol which has been discontinued. Awaiting records from patient's cardiologist office in [place]. Heart rate improved from 40s to 50s and asymptomatic.  Heart rate came  up into the 60 range after withdrawal of blockade Will need further discussion as an outpatient with her normal cardiologist Acute on chronic kidney disease  Spoke with PCP her baseline creatinine is 2-2.5 and sees nephrology as outpt  Possibly in the setting of poor po intake vs diuretics.  UA suggested UTI  and received 3 days of IV Rocephin  FeNa >2. renal ultrasound unremarkable.  Renal fn 51/2.6 on admission--36/2.29 on discharge  Diabetes mellitus  Monitor on sliding scale insulin. Hold Januvia and glipizide  during hospital stay  These were restarted on discharge  History of asthma  Stable. When necessary albuterol neb  Anemia  Appears to be chronic.  Iron panel 8/30 = 921, TIBC 265  indicating anemia of chronic disease Consider screening colonoscopy as an outpatient  Hypothyroidism  Elevated TSH noted. Synthroid dose increased to 112 mcg  Dyslipidemia  Continue statin  on discharge  Needs lipid panel in 3 months Hypertension  Continue Hydralazine and amlodipine . As per PCP her BP has been uncontrolled as  outpt.  Alpha blocker Cardura started this admission which may need to be titrated UTI  On empiric Rocephin. cultures negative.  which was discontinued 09/16/48   Depression  Patient reports feeling low due to some financial stress but denies any suicidal ideations. Continue Lexapro. Recommend outpatient psychiatric referral.  Diet:cardiac/diabetic   Echocardiogram 9/1 Study Conclusions  - Left ventricle: The cavity size was normal. Wall thickness was increased in a pattern of mild LVH. Systolic function was normal. The estimated ejection fraction was in the range of 60% to 65%. - Aortic  valve: Mildly calcified annulus. Trileaflet; moderately thickened leaflets. There was mild stenosis. There was mild to moderate regurgitation. Mean gradient (S): 17 mm Hg. Valve area (VTI): 1.64 cm^2. Valve area (Vmax): 1.69 cm^2. - Aorta: Aortic root dimension: 25 mm (ED). - Aortic  root: The aortic root was normal in size. - Mitral valve: Mildly calcified annulus. Mildly thickened leaflets . - Left atrium: The atrium was severely dilated. - Right atrium: The atrium was mildly dilated. - Systemic veins: The IVC is dilated, poor visualization limits respiratory response. RA pressure is at least 8 mmHg.      consults cardiology Alert pleasant oriented   Discharge Exam: Filed Vitals:   09/17/13 0933  BP: 148/45  Pulse: 59  Temp:   Resp:     General:  alert pleasant oriented Cardiovascular:  S1-S2 no murmur rub or gallop Respiratory:  clinically clear  Discharge Instructions You were cared for by a hospitalist during your hospital stay. If you have any questions about your discharge medications or the care you received while you were in the hospital after you are discharged, you can call the unit and asked to speak with the hospitalist on call if the hospitalist that took care of you is not available. Once you are discharged, your primary care physician will handle any further medical issues. Please note that NO REFILLS for any discharge medications will be authorized once you are discharged, as it is imperative that you return to your primary care physician (or establish a relationship with a primary care physician if you do not have one) for your aftercare needs so that they can reassess your need for medications and monitor your lab values.     Medication List    ASK your doctor about these medications       albuterol 108 (90 BASE) MCG/ACT inhaler  Commonly known as:  PROVENTIL HFA;VENTOLIN HFA  Inhale 2 puffs into the lungs 3 (three) times daily as needed. Shortness of breath     amLODipine 10 MG tablet  Commonly known as:  NORVASC  Take 10 mg by mouth daily.     aspirin 81 MG chewable tablet  Chew 81 mg by mouth daily.     atorvastatin 40 MG tablet  Commonly known as:  LIPITOR  Take 40 mg by mouth every evening.     escitalopram 10 MG tablet   Commonly known as:  LEXAPRO  Take 10 mg by mouth daily.     furosemide 40 MG tablet  Commonly known as:  LASIX  Take 40 mg by mouth 2 (two) times daily.     glipiZIDE 5 MG tablet  Commonly known as:  GLUCOTROL  Take 5 mg by mouth 2 (two) times daily before a meal.     hydrALAZINE 100 MG tablet  Commonly known as:  APRESOLINE  Take 100 mg by mouth 3 (three) times daily.     insulin glargine 100 UNIT/ML injection  Commonly known as:  LANTUS  Inject 10 Units into the skin at bedtime.     levofloxacin 500 MG tablet  Commonly known as:  LEVAQUIN  Take 500 mg by mouth daily. For 7 days. Starting 8/27     levothyroxine 100 MCG tablet  Commonly known as:  SYNTHROID, LEVOTHROID  Take 100 mcg by mouth daily before breakfast.     metoprolol succinate 50 MG 24 hr tablet  Commonly known as:  TOPROL-XL  Take 50 mg by mouth daily. Take with or immediately following a meal.  omeprazole 20 MG capsule  Commonly known as:  PRILOSEC  Take 20 mg by mouth daily.     sitaGLIPtin 25 MG tablet  Commonly known as:  JANUVIA  Take 25 mg by mouth daily.     torsemide 10 MG tablet  Commonly known as:  DEMADEX  Take 10 mg by mouth 2 (two) times daily.       Allergies  Allergen Reactions  . Ampicillin Rash      The results of significant diagnostics from this hospitalization (including imaging, microbiology, ancillary and laboratory) are listed below for reference.    Significant Diagnostic Studies: Dg Chest 2 View  09/14/2013   CLINICAL DATA:  73 year old female with shortness of breath, recently diagnosed with pneumonia and placed on antibiotics. Lower extremity swelling cough and congestion. Initial encounter.  EXAM: CHEST  2 VIEW  COMPARISON:  09/20/2012 and earlier.  FINDINGS: Confluent lower lobe pulmonary opacity, probably bilateral based on the frontal view. Small volume pleural fluid. No pneumothorax. Upper lobes are spared. Chronic cardiomegaly. Stable mediastinal contours.  Visualized tracheal air column is within normal limits. No acute osseous abnormality identified.  IMPRESSION: Bilateral lower lobe airspace disease compatible with pneumonia in the appropriate clinical setting. Small volume pleural effusions.  Chronic cardiomegaly. Asymmetric pulmonary edema is a less likely explanation for the lower lobe findings.  Post treatment radiographs recommended to document resolution.   Electronically Signed   By: Lars Pinks M.D.   On: 09/14/2013 13:42   US Renal  09/15/2013   CLINICAL DATA:  Acute renal injury  EXAM: RENAL/URINARY TRACT ULTRASOUND COMPLETE  COMPARISON:  None.  FINDINGS: Right Kidney:  Length: 10.2 cm. Multiple small cysts are noted. The largest of these measures 12 mm.  Left Kidney:  Length: 10.5 cm. Echogenicity within normal limits. No mass or hydronephrosis visualized.  Bladder:  Appears normal for degree of bladder distention.  IMPRESSION: Right renal cyst.  No acute abnormality is noted.   Electronically Signed   By: Inez Catalina M.D.   On: 09/15/2013 15:56    Microbiology: Recent Results (from the past 240 hour(s))  MRSA PCR SCREENING     Status: None   Collection Time    09/14/13  3:10 PM      Result Value Ref Range Status   MRSA by PCR NEGATIVE  NEGATIVE Final   Comment:            The GeneXpert MRSA Assay (FDA     approved for NASAL specimens     only), is one component of a     comprehensive MRSA colonization     surveillance program. It is not     intended to diagnose MRSA     infection nor to guide or     monitor treatment for     MRSA infections.  URINE CULTURE     Status: None   Collection Time    09/14/13  4:10 PM      Result Value Ref Range Status   Specimen Description URINE, CLEAN CATCH   Final   Special Requests NONE   Final   Culture  Setup Time     Final   Value: 09/15/2013 14:19     Performed at [facility]     Final   Value: NO GROWTH     Performed at [facility]   Culture     Final    Value: NO GROWTH     Performed at  [facility]   Report Status 09/16/2013 FINAL   Final     Labs: Basic Metabolic Panel:  Recent Labs Lab 09/14/13 1218 09/15/13 0443 09/16/13 0424 09/17/13 0617  NA 137 141 142 143  K 4.2 4.0 4.0 4.0  CL 99 104 103 105  CO2 21 23 25 25   GLUCOSE 153* 90 91 98  BUN 51* 46* 41* 36*  CREATININE 2.60* 2.56* 2.59* 2.29*  CALCIUM 8.9 8.8 8.5 8.4   Liver Function Tests: No results found for this basename: AST, ALT, ALKPHOS, BILITOT, PROT, ALBUMIN,  in the last 168 hours No results found for this basename: LIPASE, AMYLASE,  in the last 168 hours No results found for this basename: AMMONIA,  in the last 168 hours CBC:  Recent Labs Lab 09/14/13 1218  WBC 8.8  NEUTROABS 7.2  HGB 10.2*  HCT 31.2*  MCV 88.1  PLT 333   Cardiac Enzymes:  Recent Labs Lab 09/14/13 1218 09/14/13 2206 09/15/13 0443 09/15/13 1013  TROPONINI <0.30 <0.30 <0.30 <0.30   BNP: BNP (last 3 results)  Recent Labs  09/20/12 1550 09/14/13 1218  PROBNP 1520.0* 5214.0*   CBG:  Recent Labs Lab 09/16/13 0723 09/16/13 1112 09/16/13 1639 09/16/13 2044 09/17/13 0727  GLUCAP 83 128* 117* 168* 99       Signed:  Jazzmyn Filion, JAI-GURMUKH  [facility] 09/17/2013, 11:22 AM

## 2014-07-18 ENCOUNTER — Encounter (HOSPITAL_COMMUNITY): Payer: Self-pay | Admitting: Emergency Medicine

## 2014-07-18 ENCOUNTER — Inpatient Hospital Stay (HOSPITAL_COMMUNITY)
Admission: EM | Admit: 2014-07-18 | Discharge: 2014-07-22 | DRG: 065 | Disposition: A | Discharge status: Home Health | Payer: Medicare Other | Attending: Internal Medicine | Admitting: Internal Medicine

## 2014-07-18 ENCOUNTER — Emergency Department (HOSPITAL_COMMUNITY): Payer: Medicare Other

## 2014-07-18 DIAGNOSIS — Z7982 Long term (current) use of aspirin: Secondary | ICD-10-CM | POA: Diagnosis not present

## 2014-07-18 DIAGNOSIS — R29810 Facial weakness: Secondary | ICD-10-CM | POA: Diagnosis present

## 2014-07-18 DIAGNOSIS — I10 Essential (primary) hypertension: Secondary | ICD-10-CM | POA: Diagnosis present

## 2014-07-18 DIAGNOSIS — E872 Acidosis: Secondary | ICD-10-CM | POA: Diagnosis present

## 2014-07-18 DIAGNOSIS — E1122 Type 2 diabetes mellitus with diabetic chronic kidney disease: Secondary | ICD-10-CM | POA: Diagnosis present

## 2014-07-18 DIAGNOSIS — I129 Hypertensive chronic kidney disease with stage 1 through stage 4 chronic kidney disease, or unspecified chronic kidney disease: Secondary | ICD-10-CM | POA: Diagnosis present

## 2014-07-18 DIAGNOSIS — E1165 Type 2 diabetes mellitus with hyperglycemia: Secondary | ICD-10-CM | POA: Diagnosis present

## 2014-07-18 DIAGNOSIS — I6789 Other cerebrovascular disease: Secondary | ICD-10-CM | POA: Diagnosis not present

## 2014-07-18 DIAGNOSIS — Z823 Family history of stroke: Secondary | ICD-10-CM

## 2014-07-18 DIAGNOSIS — R7989 Other specified abnormal findings of blood chemistry: Secondary | ICD-10-CM | POA: Insufficient documentation

## 2014-07-18 DIAGNOSIS — F329 Major depressive disorder, single episode, unspecified: Secondary | ICD-10-CM | POA: Diagnosis present

## 2014-07-18 DIAGNOSIS — D649 Anemia, unspecified: Secondary | ICD-10-CM | POA: Diagnosis present

## 2014-07-18 DIAGNOSIS — I63411 Cerebral infarction due to embolism of right middle cerebral artery: Secondary | ICD-10-CM | POA: Diagnosis not present

## 2014-07-18 DIAGNOSIS — E039 Hypothyroidism, unspecified: Secondary | ICD-10-CM | POA: Diagnosis present

## 2014-07-18 DIAGNOSIS — Z8673 Personal history of transient ischemic attack (TIA), and cerebral infarction without residual deficits: Secondary | ICD-10-CM | POA: Diagnosis not present

## 2014-07-18 DIAGNOSIS — N183 Chronic kidney disease, stage 3 unspecified: Secondary | ICD-10-CM | POA: Diagnosis present

## 2014-07-18 DIAGNOSIS — I639 Cerebral infarction, unspecified: Secondary | ICD-10-CM | POA: Diagnosis present

## 2014-07-18 DIAGNOSIS — Z880 Allergy status to penicillin: Secondary | ICD-10-CM

## 2014-07-18 DIAGNOSIS — E785 Hyperlipidemia, unspecified: Secondary | ICD-10-CM | POA: Diagnosis present

## 2014-07-18 DIAGNOSIS — I351 Nonrheumatic aortic (valve) insufficiency: Secondary | ICD-10-CM | POA: Diagnosis not present

## 2014-07-18 DIAGNOSIS — I6523 Occlusion and stenosis of bilateral carotid arteries: Secondary | ICD-10-CM | POA: Diagnosis present

## 2014-07-18 DIAGNOSIS — R001 Bradycardia, unspecified: Secondary | ICD-10-CM | POA: Diagnosis present

## 2014-07-18 DIAGNOSIS — R471 Dysarthria and anarthria: Secondary | ICD-10-CM | POA: Diagnosis present

## 2014-07-18 DIAGNOSIS — R778 Other specified abnormalities of plasma proteins: Secondary | ICD-10-CM

## 2014-07-18 DIAGNOSIS — I63412 Cerebral infarction due to embolism of left middle cerebral artery: Secondary | ICD-10-CM | POA: Diagnosis not present

## 2014-07-18 DIAGNOSIS — R4781 Slurred speech: Secondary | ICD-10-CM | POA: Diagnosis present

## 2014-07-18 DIAGNOSIS — R739 Hyperglycemia, unspecified: Secondary | ICD-10-CM | POA: Diagnosis not present

## 2014-07-18 LAB — URINALYSIS, ROUTINE W REFLEX MICROSCOPIC
Bilirubin Urine: NEGATIVE
Glucose, UA: NEGATIVE mg/dL
Hgb urine dipstick: NEGATIVE
Ketones, ur: NEGATIVE mg/dL
Nitrite: NEGATIVE
PH: 7 (ref 5.0–8.0)
Protein, ur: 30 mg/dL — AB
SPECIFIC GRAVITY, URINE: 1.01 (ref 1.005–1.030)
UROBILINOGEN UA: 0.2 mg/dL (ref 0.0–1.0)

## 2014-07-18 LAB — CBC
HEMATOCRIT: 35.2 % — AB (ref 36.0–46.0)
Hemoglobin: 10.9 g/dL — ABNORMAL LOW (ref 12.0–15.0)
MCH: 30.4 pg (ref 26.0–34.0)
MCHC: 31 g/dL (ref 30.0–36.0)
MCV: 98.1 fL (ref 78.0–100.0)
Platelets: 305 10*3/uL (ref 150–400)
RBC: 3.59 MIL/uL — ABNORMAL LOW (ref 3.87–5.11)
RDW: 14.9 % (ref 11.5–15.5)
WBC: 8.5 10*3/uL (ref 4.0–10.5)

## 2014-07-18 LAB — DIFFERENTIAL
Basophils Absolute: 0.1 10*3/uL (ref 0.0–0.1)
Basophils Relative: 1 % (ref 0–1)
EOS ABS: 0.6 10*3/uL (ref 0.0–0.7)
Eosinophils Relative: 7 % — ABNORMAL HIGH (ref 0–5)
LYMPHS PCT: 36 % (ref 12–46)
Lymphs Abs: 3 10*3/uL (ref 0.7–4.0)
MONOS PCT: 7 % (ref 3–12)
Monocytes Absolute: 0.6 10*3/uL (ref 0.1–1.0)
NEUTROS ABS: 4.3 10*3/uL (ref 1.7–7.7)
NEUTROS PCT: 51 % (ref 43–77)

## 2014-07-18 LAB — URINE MICROSCOPIC-ADD ON

## 2014-07-18 LAB — COMPREHENSIVE METABOLIC PANEL
ALT: 13 U/L — ABNORMAL LOW (ref 14–54)
ANION GAP: 9 (ref 5–15)
AST: 16 U/L (ref 15–41)
Albumin: 4.2 g/dL (ref 3.5–5.0)
Alkaline Phosphatase: 66 U/L (ref 38–126)
BILIRUBIN TOTAL: 0.6 mg/dL (ref 0.3–1.2)
BUN: 56 mg/dL — AB (ref 6–20)
CO2: 19 mmol/L — ABNORMAL LOW (ref 22–32)
Calcium: 8.5 mg/dL — ABNORMAL LOW (ref 8.9–10.3)
Chloride: 111 mmol/L (ref 101–111)
Creatinine, Ser: 1.89 mg/dL — ABNORMAL HIGH (ref 0.44–1.00)
GFR calc non Af Amer: 25 mL/min — ABNORMAL LOW (ref >60)
GFR, EST AFRICAN AMERICAN: 29 mL/min — AB (ref >60)
GLUCOSE: 227 mg/dL — AB (ref 65–99)
POTASSIUM: 3.7 mmol/L (ref 3.5–5.1)
Sodium: 139 mmol/L (ref 135–145)
Total Protein: 7.6 g/dL (ref 6.5–8.1)

## 2014-07-18 LAB — PROTIME-INR
INR: 1.02 (ref 0.00–1.49)
PROTHROMBIN TIME: 13.6 s (ref 11.6–15.2)

## 2014-07-18 LAB — TROPONIN I: TROPONIN I: 0.12 ng/mL — AB (ref <0.031)

## 2014-07-18 LAB — RAPID URINE DRUG SCREEN, HOSP PERFORMED
Amphetamines: NOT DETECTED
BENZODIAZEPINES: NOT DETECTED
Barbiturates: NOT DETECTED
Cocaine: NOT DETECTED
OPIATES: NOT DETECTED
Tetrahydrocannabinol: NOT DETECTED

## 2014-07-18 LAB — APTT: aPTT: 30 seconds (ref 24–37)

## 2014-07-18 LAB — CBG MONITORING, ED: GLUCOSE-CAPILLARY: 223 mg/dL — AB (ref 65–99)

## 2014-07-18 LAB — ETHANOL

## 2014-07-18 MED ORDER — SODIUM BICARBONATE 650 MG PO TABS
1300.0000 mg | ORAL_TABLET | Freq: Two times a day (BID) | ORAL | Status: DC
  Administered 2014-07-19 – 2014-07-22 (×7): 1300 mg via ORAL
  Filled 2014-07-18 (×8): qty 2

## 2014-07-18 MED ORDER — ATORVASTATIN CALCIUM 40 MG PO TABS: 40.0000 mg | ORAL_TABLET | Freq: Every evening | ORAL | Status: DC

## 2014-07-18 MED ORDER — KETOTIFEN FUMARATE 0.025 % OP SOLN
1.0000 [drp] | Freq: Every day | OPHTHALMIC | Status: DC
  Filled 2014-07-18: qty 5

## 2014-07-18 MED ORDER — LEVOTHYROXINE SODIUM 100 MCG PO TABS
100.0000 ug | ORAL_TABLET | Freq: Every day | ORAL | Status: DC
  Administered 2014-07-19 – 2014-07-22 (×4): 100 ug via ORAL
  Filled 2014-07-18 (×4): qty 1

## 2014-07-18 MED ORDER — ASPIRIN 300 MG RE SUPP: 300.0000 mg | Freq: Every day | RECTAL | Status: DC

## 2014-07-18 MED ORDER — ESCITALOPRAM OXALATE 10 MG PO TABS
10.0000 mg | ORAL_TABLET | Freq: Every day | ORAL | Status: DC
  Administered 2014-07-19 – 2014-07-22 (×3): 10 mg via ORAL
  Filled 2014-07-18 (×3): qty 1

## 2014-07-18 MED ORDER — ENOXAPARIN SODIUM 30 MG/0.3ML ~~LOC~~ SOLN
30.0000 mg | Freq: Every day | SUBCUTANEOUS | Status: DC
Start: 2014-07-19 — End: 2014-07-20
  Administered 2014-07-19 (×2): 30 mg via SUBCUTANEOUS
  Filled 2014-07-18 (×2): qty 0.3

## 2014-07-18 MED ORDER — PANTOPRAZOLE SODIUM 40 MG PO TBEC
40.0000 mg | DELAYED_RELEASE_TABLET | Freq: Every day | ORAL | Status: DC
  Administered 2014-07-19 – 2014-07-22 (×3): 40 mg via ORAL
  Filled 2014-07-18 (×3): qty 1

## 2014-07-18 MED ORDER — SODIUM CHLORIDE 0.9 % IV SOLN
INTRAVENOUS | Status: DC
  Administered 2014-07-19 – 2014-07-21 (×3): via INTRAVENOUS

## 2014-07-18 MED ORDER — KETOTIFEN FUMARATE 0.025 % OP SOLN
1.0000 [drp] | Freq: Every day | OPHTHALMIC | Status: DC
  Administered 2014-07-19 – 2014-07-22 (×4): 1 [drp] via OPHTHALMIC
  Filled 2014-07-18: qty 5

## 2014-07-18 MED ORDER — STROKE: EARLY STAGES OF RECOVERY BOOK
Freq: Once | Status: AC
  Administered 2014-07-19: 01:00:00

## 2014-07-18 MED ORDER — ASPIRIN 325 MG PO TABS
325.0000 mg | ORAL_TABLET | Freq: Every day | ORAL | Status: DC
  Administered 2014-07-19 – 2014-07-22 (×3): 325 mg via ORAL
  Filled 2014-07-18 (×3): qty 1

## 2014-07-18 NOTE — ED Provider Notes (Signed)
CSN: [id number]     Arrival date & time 07/18/14  2035 History   First MD Initiated Contact with Patient 07/18/14 2058     Chief Complaint  Patient presents with  . Code Stroke    An emergency department physician performed an initial assessment on this suspected stroke patient at 2109.  The history is provided by a relative and the patient. The history is limited by the condition of the patient (Code Stroke, slurred speech).  Pt was seen at 2055. Per pt's granddaughters at bedside: Pt was LKW approximately 1330 today ("she was snapping string beans"). Pt's daughters state they went to check on pt approximately 1930 PTA and "she wouldn't answer the phone or the door." Pt's family states "after about a 1/2 hour" pt "finally came to the door but couldn't seem to open it." That is when they noticed pt's right facial droop and slurred speech. Pt herself denies falls, no visual changes, no CP/SOB, no abd pain, no N/V/D.    Past Medical History  Diagnosis Date  . Diabetes mellitus   . Hypertension   . Hypothyroidism 12/15/2010  . Hyperlipidemia 12/15/2010  . Anemia   . Depression   . Renal disorder    Past Surgical History  Procedure Laterality Date  . Abdominal hysterectomy     Family History  Problem Relation Age of Onset  . Stroke Mother   . Cancer Sister    History  Substance Use Topics  . Smoking status: Never Smoker   . Smokeless tobacco: Not on file  . Alcohol Use: No    Review of Systems  Unable to perform ROS: Acuity of condition      Allergies  Ampicillin  Home Medications   Prior to Admission medications   Medication Sig Start Date End Date Taking? Authorizing Provider  acetaminophen (TYLENOL) 650 MG CR tablet Take 650 mg by mouth every 8 (eight) hours as needed for pain.   Yes Historical Provider, MD  aspirin 81 MG chewable tablet Chew 81 mg by mouth daily.   Yes Historical Provider, MD  atorvastatin (LIPITOR) 40 MG tablet Take 40 mg by mouth every evening.    Yes Historical Provider, MD  escitalopram (LEXAPRO) 10 MG tablet Take 10 mg by mouth daily.   Yes Historical Provider, MD  furosemide (LASIX) 40 MG tablet Take 40 mg by mouth 2 (two) times daily.   Yes Historical Provider, MD  hydrALAZINE (APRESOLINE) 100 MG tablet Take 100 mg by mouth 3 (three) times daily.   Yes Historical Provider, MD  ketotifen (ALAWAY) 0.025 % ophthalmic solution Place 1 drop into both eyes daily.   Yes Historical Provider, MD  levothyroxine (SYNTHROID, LEVOTHROID) 100 MCG tablet Take 100 mcg by mouth daily before breakfast.   Yes Historical Provider, MD  NIFEdipine (PROCARDIA-XL/ADALAT CC) 30 MG 24 hr tablet Take 30 mg by mouth 2 (two) times daily.   Yes Historical Provider, MD  omeprazole (PRILOSEC) 20 MG capsule Take 20 mg by mouth daily.   Yes Historical Provider, MD  PRESCRIPTION MEDICATION Take 1 capsule by mouth daily. Acetazolamide ER 500 MG   Yes Historical Provider, MD  sodium bicarbonate 650 MG tablet Take 1,300 mg by mouth 2 (two) times daily.   Yes Historical Provider, MD  traMADol (ULTRAM) 50 MG tablet Take 50 mg by mouth every 6 (six) hours as needed.   Yes Historical Provider, MD   BP 179/56 mmHg  Pulse 74  Temp(Src) 98.3 F (36.8 C) (Oral)  Resp 18  Ht 5\' 7"  (1.702 m)  Wt 171 lb (77.565 kg)  BMI 26.78 kg/m2  SpO2 100% Physical Exam  2055: Physical examination:  Nursing notes reviewed; Vital signs and O2 SAT reviewed;  Constitutional: Well developed, Well nourished, Well hydrated, In no acute distress; Head:  Normocephalic, atraumatic; Eyes: EOMI, PERRL, No scleral icterus; ENMT: Mouth and pharynx normal, Mucous membranes moist; Neck: Supple, Full range of motion, No lymphadenopathy; Cardiovascular: Regular rate and rhythm, No gallop; Respiratory: Breath sounds clear & equal bilaterally, No wheezes.  Speaking full sentences with ease, Normal respiratory effort/excursion; Chest: Nontender, Movement normal; Abdomen: Soft, Nontender, Nondistended, Normal  bowel sounds; Genitourinary: No CVA tenderness; Extremities: Pulses normal, No tenderness, No edema, No calf edema or asymmetry.; Neuro: AA&Ox3, Major CN grossly intact. Speech slurred.  +right facial droop.  No nystagmus. Left grip weaker than right grip. Strength 4/5 LUE, and 5/5 RUE, bilat LE's. DTR 2/4 equal bilat UE's and LE's.  No gross sensory deficits.  Normal cerebellar testing bilat UE's (finger-nose) and LE's (heel-shin).; Skin: Color normal, Warm, Dry.    ED Course  Procedures     EKG Interpretation   Date/Time:  Saturday July 18 2014 20:53:17 EDT Ventricular Rate:  77 PR Interval:  364 QRS Duration: 80 QT Interval:  381 QTC Calculation: 431 R Axis:   92 Text Interpretation:  Sinus rhythm with 1st degree A-V block Right axis  deviation Consider left ventricular hypertrophy Nonspecific T  abnormalities, inferior leads Nonspecific T wave abnormality Lateral leads  Baseline wander When compared with ECG of 09/15/2013 Nonspecific T wave  abnormality Lateral leads is now Present Normal sinus rhythm has replaced  Junctional bradycardia Confirmed by [facility]  MD, Nunzio Cory [phone]) on  07/18/2014 9:27:37 PM      EKG Interpretation  Date/Time:  Saturday July 18 2014 22:23:05 EDT Ventricular Rate:  68 PR Interval:  364 QRS Duration: 85 QT Interval:  433 QTC Calculation: 460 R Axis:   93 Text Interpretation:  Junctional rhythm Right axis deviation Consider left ventricular hypertrophy Nonspecific T abnormalities, inferior leads Nonspecific T wave abnormality Lateral leads Junctional rhythm has replaced Normal sinus rhythm Since last tracing of earlier today Confirmed by [facility]  MD, Nunzio Cory [phone]) on 07/18/2014 10:26:20 PM          MDM  MDM Reviewed: previous chart, nursing note and vitals Reviewed previous: labs and ECG Interpretation: labs, ECG and CT scan Total time providing critical care: 30-74 minutes. This excludes time spent performing separately reportable  procedures and services. Consults: neurology and admitting MD   CRITICAL CARE Performed by: Alfonzo Feller Total critical care time: 35 Critical care time was exclusive of separately billable procedures and treating other patients. Critical care was necessary to treat or prevent imminent or life-threatening deterioration. Critical care was time spent personally by me on the following activities: development of treatment plan with patient and/or surrogate as well as nursing, discussions with consultants, evaluation of patient's response to treatment, examination of patient, obtaining history from patient or surrogate, ordering and performing treatments and interventions, ordering and review of laboratory studies, ordering and review of radiographic studies, pulse oximetry and re-evaluation of patient's condition.   Results for orders placed or performed during the hospital encounter of 07/18/14  Ethanol  Result Value Ref Range   Alcohol, Ethyl (B) <5 <5 mg/dL  Protime-INR  Result Value Ref Range   Prothrombin Time 13.6 11.6 - 15.2 seconds   INR 1.02 0.00 - 1.49  APTT  Result Value Ref Range  aPTT 30 24 - 37 seconds  CBC  Result Value Ref Range   WBC 8.5 4.0 - 10.5 K/uL   RBC 3.59 (L) 3.87 - 5.11 MIL/uL   Hemoglobin 10.9 (L) 12.0 - 15.0 g/dL   HCT 35.2 (L) 36.0 - 46.0 %   MCV 98.1 78.0 - 100.0 fL   MCH 30.4 26.0 - 34.0 pg   MCHC 31.0 30.0 - 36.0 g/dL   RDW 14.9 11.5 - 15.5 %   Platelets 305 150 - 400 K/uL  Differential  Result Value Ref Range   Neutrophils Relative % 51 43 - 77 %   Neutro Abs 4.3 1.7 - 7.7 K/uL   Lymphocytes Relative 36 12 - 46 %   Lymphs Abs 3.0 0.7 - 4.0 K/uL   Monocytes Relative 7 3 - 12 %   Monocytes Absolute 0.6 0.1 - 1.0 K/uL   Eosinophils Relative 7 (H) 0 - 5 %   Eosinophils Absolute 0.6 0.0 - 0.7 K/uL   Basophils Relative 1 0 - 1 %   Basophils Absolute 0.1 0.0 - 0.1 K/uL  Comprehensive metabolic panel  Result Value Ref Range   Sodium 139 135  - 145 mmol/L   Potassium 3.7 3.5 - 5.1 mmol/L   Chloride 111 101 - 111 mmol/L   CO2 19 (L) 22 - 32 mmol/L   Glucose, Bld 227 (H) 65 - 99 mg/dL   BUN 56 (H) 6 - 20 mg/dL   Creatinine, Ser 1.89 (H) 0.44 - 1.00 mg/dL   Calcium 8.5 (L) 8.9 - 10.3 mg/dL   Total Protein 7.6 6.5 - 8.1 g/dL   Albumin 4.2 3.5 - 5.0 g/dL   AST 16 15 - 41 U/L   ALT 13 (L) 14 - 54 U/L   Alkaline Phosphatase 66 38 - 126 U/L   Total Bilirubin 0.6 0.3 - 1.2 mg/dL   GFR calc non Af Amer 25 (L) >60 mL/min   GFR calc Af Amer 29 (L) >60 mL/min   Anion gap 9 5 - 15  Urine rapid drug screen (hosp performed)not at [facility]  Result Value Ref Range   Opiates NONE DETECTED NONE DETECTED   Cocaine NONE DETECTED NONE DETECTED   Benzodiazepines NONE DETECTED NONE DETECTED   Amphetamines NONE DETECTED NONE DETECTED   Tetrahydrocannabinol NONE DETECTED NONE DETECTED   Barbiturates NONE DETECTED NONE DETECTED  Urinalysis, Routine w reflex microscopic (not at [facility])  Result Value Ref Range   Color, Urine YELLOW YELLOW   APPearance CLEAR CLEAR   Specific Gravity, Urine 1.010 1.005 - 1.030   pH 7.0 5.0 - 8.0   Glucose, UA NEGATIVE NEGATIVE mg/dL   Hgb urine dipstick NEGATIVE NEGATIVE   Bilirubin Urine NEGATIVE NEGATIVE   Ketones, ur NEGATIVE NEGATIVE mg/dL   Protein, ur 30 (A) NEGATIVE mg/dL   Urobilinogen, UA 0.2 0.0 - 1.0 mg/dL   Nitrite NEGATIVE NEGATIVE   Leukocytes, UA SMALL (A) NEGATIVE  Urine microscopic-add on  Result Value Ref Range   Squamous Epithelial / LPF MANY (A) RARE   WBC, UA 3-6 <3 WBC/hpf   Bacteria, UA MANY (A) RARE  Troponin I  Result Value Ref Range   Troponin I 0.12 (H) <0.031 ng/mL  CBG monitoring, ED  Result Value Ref Range   Glucose-Capillary 223 (H) 65 - 99 mg/dL   Ct Head Wo Contrast 07/18/2014   CLINICAL DATA:  Right-sided facial droop and slurred speech.  EXAM: CT HEAD WITHOUT CONTRAST  TECHNIQUE: Contiguous axial images were obtained from  the base of the skull through the vertex without  intravenous contrast.  COMPARISON:  08/11/2004  FINDINGS: There is no intracranial hemorrhage, mass or evidence of acute infarction. There is hypodensity in the left corona radiata and posterior left lentiform nuclei which may represent old infarction in there also was hypodensity in the posterior left lentiform nuclei on the 2006 examination. There is mild generalized atrophy. There is mild microvascular ischemic change. Posterior fossa and brainstem appear unremarkable.  There is no bony abnormality. The visible paranasal sinuses are clear.  IMPRESSION: No acute findings. Mild generalized atrophy and chronic microvascular changes. 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 These results were called by telephone at the time of interpretation on 07/18/2014 at 9:19 pm to Dr. Francine Graven , who verbally acknowledged these results.   Electronically Signed   By: Andreas Newport M.D.   On: 07/18/2014 21:21    2055:  Code Stroke called during my evaluation. LKW 1330 per granddaughters at bedside.  2100:  Neuro SOC Triage RN called back: will have [place] MD eval.  2140:  Neuro SOC Dr. Ronnald Ramp has evaluated pt: states a son now arrived to the ED and states he saw pt up until 1530 at her baseline, regardless pt is outside any window for intervention, pt likely has had a small stroke, needs admission with standard stroke workup.   2150:  No change in pt assessment.  Dx and testing d/w pt and family.  Questions answered.  Verb understanding, agreeable to transfer to [facility] for admit (no MRI here this weekend). T/C to [facility] Dr. Darrick Meigs, case discussed, including:  HPI, pertinent PM/SHx, VS/PE, dx testing, ED course and treatment:  Agreeable to transfer to [facility]/admit, requests he will come to the ED for eval.   Francine Graven, DO 07/20/14 2048

## 2014-07-18 NOTE — ED Notes (Signed)
Per family memebers they went to house and pt wouldn;t answer her door or answer her phone-  Pt eventually answered door. Per family speech slurred. Pt diabetic - Accucheck performed upon arrival to room >200

## 2014-07-18 NOTE — ED Notes (Addendum)
Dr Thurnell Garbe located and informed of pt's arrival , complaints and Accuchecks -

## 2014-07-18 NOTE — Progress Notes (Signed)
Pt arrived to unit alert and oriented x4. Oriented to room and unit. Call bell at side. Bed alarm activated. Will continue to monitor. Bobbye Charleston, RN

## 2014-07-18 NOTE — ED Notes (Signed)
Pt transported to ct with RN,

## 2014-07-18 NOTE — ED Notes (Signed)
Code stroke called by Dr Thurnell Garbe, Nelwyn [place] sec. Notified,

## 2014-07-18 NOTE — H&P (Addendum)
PCP:   Kendrick Ranch, MD   Chief Complaint:  Slurred speech  HPI:  74 year old female who  has a past medical history of Diabetes mellitus; Hypertension; Hypothyroidism (12/15/2010); Hyperlipidemia (12/15/2010); Anemia; Depression; and Renal disorder. Today patient presents to the hospital with chief complaint of slurred speech which started around 3:30 PM. Patient's daughter went to check on patient around 7:30 PM as she wouldn't answer the phone or the door. After about half an hour patient finally came to the door but could seemed to open it. That is M.D. noted that patient had slurred speech and patient was brought to the ED. Initially code stroke was called by the ED physician, patient was found not to be a TPA candidate as she was out of window. Patient denies passing out, no history of seizures. Denies weakness of extremities. No previous history of stroke. Patient denies nausea vomiting or diarrhea. No chest pain or shortness of breath. Allergies:   Allergies  Allergen Reactions  . Ampicillin Rash      Past Medical History  Diagnosis Date  . Diabetes mellitus   . Hypertension   . Hypothyroidism 12/15/2010  . Hyperlipidemia 12/15/2010  . Anemia   . Depression   . Renal disorder     Past Surgical History  Procedure Laterality Date  . Abdominal hysterectomy      Prior to Admission medications   Medication Sig Start Date End Date Taking? Authorizing Provider  acetaminophen (TYLENOL) 650 MG CR tablet Take 650 mg by mouth every 8 (eight) hours as needed for pain.   Yes Historical Provider, MD  aspirin 81 MG chewable tablet Chew 81 mg by mouth daily.   Yes Historical Provider, MD  atorvastatin (LIPITOR) 40 MG tablet Take 40 mg by mouth every evening.   Yes Historical Provider, MD  escitalopram (LEXAPRO) 10 MG tablet Take 10 mg by mouth daily.   Yes Historical Provider, MD  furosemide (LASIX) 40 MG tablet Take 40 mg by mouth 2 (two) times daily.   Yes  Historical Provider, MD  hydrALAZINE (APRESOLINE) 100 MG tablet Take 100 mg by mouth 3 (three) times daily.   Yes Historical Provider, MD  ketotifen (ALAWAY) 0.025 % ophthalmic solution Place 1 drop into both eyes daily.   Yes Historical Provider, MD  levothyroxine (SYNTHROID, LEVOTHROID) 100 MCG tablet Take 100 mcg by mouth daily before breakfast.   Yes Historical Provider, MD  NIFEdipine (PROCARDIA-XL/ADALAT CC) 30 MG 24 hr tablet Take 30 mg by mouth 2 (two) times daily.   Yes Historical Provider, MD  omeprazole (PRILOSEC) 20 MG capsule Take 20 mg by mouth daily.   Yes Historical Provider, MD  PRESCRIPTION MEDICATION Take 1 capsule by mouth daily. Acetazolamide ER 500 MG   Yes Historical Provider, MD  sodium bicarbonate 650 MG tablet Take 1,300 mg by mouth 2 (two) times daily.   Yes Historical Provider, MD  traMADol (ULTRAM) 50 MG tablet Take 50 mg by mouth every 6 (six) hours as needed.   Yes Historical Provider, MD    Social History:  reports that she has never smoked. She does not have any smokeless tobacco history on file. She reports that she does not drink alcohol or use illicit drugs.  Family History  Problem Relation Age of Onset  . Stroke Mother   . Cancer Sister     Beth Padilla Weights   07/18/14 2048  Weight: 77.565 kg (171 lb)    Review of Systems:  As in the history of present illness,  rest of the review of systems is negative   Physical Exam: Blood pressure 174/79, pulse 66, temperature 98.3 F (36.8 C), temperature source Oral, resp. rate 15, height 5\' 7"  (1.702 m), weight 77.565 kg (171 lb), SpO2 100 %. Constitutional:   Patient is a well-developed and well-nourished female in no acute distress and cooperative with exam. Head: Normocephalic and atraumatic Mouth: Mucus membranes moist Eyes: PERRL, EOMI, conjunctivae normal Neck: Supple, No Thyromegaly Cardiovascular: RRR, S1 normal, S2 normal Pulmonary/Chest: CTAB, no wheezes, rales, or rhonchi Abdominal: Soft.  Non-tender, non-distended, bowel sounds are normal, no masses, organomegaly, or guarding present.  Neurological: A&O x3, Strength is normal and symmetric bilaterally, cranial nerve II-XII are grossly intact,sensory intact to light touch bilaterally.  Babinski downgoing bilaterally. Slurred speech Extremities : No Cyanosis, Clubbing or Edema  Labs on Admission:  Basic Metabolic Panel:  Recent Labs Lab 07/18/14 2100  NA 139  K 3.7  CL 111  CO2 19*  GLUCOSE 227*  BUN 56*  CREATININE 1.89*  CALCIUM 8.5*   Liver Function Tests:  Recent Labs Lab 07/18/14 2100  AST 16  ALT 13*  ALKPHOS 66  BILITOT 0.6  PROT 7.6  ALBUMIN 4.2   No results for input(s): LIPASE, AMYLASE in the last 168 hours. No results for input(s): AMMONIA in the last 168 hours. CBC:  Recent Labs Lab 07/18/14 2100  WBC 8.5  NEUTROABS 4.3  HGB 10.9*  HCT 35.2*  MCV 98.1  PLT 305   Cardiac Enzymes:  Recent Labs Lab 07/18/14 2100  TROPONINI 0.12*    BNP (last 3 results) No results for input(s): BNP in the last 8760 hours.  ProBNP (last 3 results)  Recent Labs  09/14/13 1218  PROBNP 5214.0*    CBG:  Recent Labs Lab 07/18/14 2043  GLUCAP 223*    Radiological Exams on Admission: Ct Head Wo Contrast  07/18/2014   CLINICAL DATA:  Right-sided facial droop and slurred speech.  EXAM: CT HEAD WITHOUT CONTRAST  TECHNIQUE: Contiguous axial images were obtained from the base of the skull through the vertex without intravenous contrast.  COMPARISON:  08/11/2004  FINDINGS: There is no intracranial hemorrhage, mass or evidence of acute infarction. There is hypodensity in the left corona radiata and posterior left lentiform nuclei which may represent old infarction in there also was hypodensity in the posterior left lentiform nuclei on the 2006 examination. There is mild generalized atrophy. There is mild microvascular ischemic change. Posterior fossa and brainstem appear unremarkable.  There is no bony  abnormality. The visible paranasal sinuses are clear.  IMPRESSION: No acute findings. Mild generalized atrophy and chronic microvascular changes. 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 These results were called by telephone at the time of interpretation on 07/18/2014 at 9:19 pm to Dr. Francine Graven , who verbally acknowledged these results.   Electronically Signed   By: Andreas Newport M.D.   On: 07/18/2014 21:21    EKG: Independently reviewed. T wave inversions in lead V5, V6, AVF  Assessmean Active Problems:   HTN (hypertension)   Hypothyroidism   Stroke  Stroke Patient admitted with slurred speech possible stroke. Will admit for stroke workup and get MRI/MRA brain, carotid ultrasound, echocardiogram, HbA1c, lipid profile. Patient was seen by tele neurology.  Elevated troponin patient has mild elevation of troponin, denies chest pain. EKG shows T-wave inversions in lead V5 V6 aVF. Will obtain serial cardiac enzymes.  Hypertension Will hold the anti-hypertensive medications for permissive hypertension.  CKD stage III Patient has C KD stage III, will start gentle IV hydration with normal saline at 75 mL per hour.  Code status: full code   Family discussion: Admission, patients condition and plan of care including tests being ordered have been discussed with the patient and *son at bedside  who indicate understanding and agree with the plan and Code Status.  Called and discussed with Dr. Blaine Hamper, who [facility]  Time Spent on Admission: 60 min  [facility] Pager: [phone] 07/18/2014, 10:40 PM  If 7PM-7AM, please contact night-coverage  [email or web address]  Password [email or web address]

## 2014-07-19 ENCOUNTER — Inpatient Hospital Stay (HOSPITAL_COMMUNITY): Payer: Medicare Other

## 2014-07-19 DIAGNOSIS — R739 Hyperglycemia, unspecified: Secondary | ICD-10-CM

## 2014-07-19 DIAGNOSIS — I6789 Other cerebrovascular disease: Secondary | ICD-10-CM

## 2014-07-19 DIAGNOSIS — I639 Cerebral infarction, unspecified: Secondary | ICD-10-CM

## 2014-07-19 DIAGNOSIS — N183 Chronic kidney disease, stage 3 unspecified: Secondary | ICD-10-CM | POA: Diagnosis present

## 2014-07-19 LAB — GLUCOSE, CAPILLARY
Glucose-Capillary: 154 mg/dL — ABNORMAL HIGH (ref 65–99)
Glucose-Capillary: 230 mg/dL — ABNORMAL HIGH (ref 65–99)
Glucose-Capillary: 181 mg/dL — ABNORMAL HIGH (ref 65–99)

## 2014-07-19 LAB — LIPID PANEL
CHOL/HDL RATIO: 3.7 ratio
Cholesterol: 173 mg/dL (ref 0–200)
HDL: 47 mg/dL (ref >40)
LDL Cholesterol: 106 mg/dL — ABNORMAL HIGH (ref 0–99)
Triglycerides: 100 mg/dL (ref <150)
VLDL: 20 mg/dL (ref 0–40)

## 2014-07-19 LAB — CBC
HEMATOCRIT: 33.1 % — AB (ref 36.0–46.0)
Hemoglobin: 10.3 g/dL — ABNORMAL LOW (ref 12.0–15.0)
MCH: 30 pg (ref 26.0–34.0)
MCHC: 31.1 g/dL (ref 30.0–36.0)
MCV: 96.5 fL (ref 78.0–100.0)
Platelets: 274 10*3/uL (ref 150–400)
RBC: 3.43 MIL/uL — AB (ref 3.87–5.11)
RDW: 15 % (ref 11.5–15.5)
WBC: 7.4 10*3/uL (ref 4.0–10.5)

## 2014-07-19 LAB — TROPONIN I
TROPONIN I: 0.11 ng/mL — AB (ref <0.031)
TROPONIN I: 0.12 ng/mL — AB (ref <0.031)
Troponin I: 0.14 ng/mL — ABNORMAL HIGH (ref <0.031)

## 2014-07-19 LAB — TSH: TSH: 1.817 u[IU]/mL (ref 0.350–4.500)

## 2014-07-19 LAB — T4, FREE: Free T4: 0.9 ng/dL (ref 0.61–1.12)

## 2014-07-19 LAB — CREATININE, SERUM
Creatinine, Ser: 1.81 mg/dL — ABNORMAL HIGH (ref 0.44–1.00)
GFR calc non Af Amer: 26 mL/min — ABNORMAL LOW (ref >60)
GFR, EST AFRICAN AMERICAN: 30 mL/min — AB (ref >60)

## 2014-07-19 MED ORDER — BOOST PLUS PO LIQD
237.0000 mL | Freq: Three times a day (TID) | ORAL | Status: DC
  Administered 2014-07-19 – 2014-07-22 (×4): 237 mL via ORAL
  Filled 2014-07-19 (×14): qty 237

## 2014-07-19 MED ORDER — INSULIN ASPART 100 UNIT/ML ~~LOC~~ SOLN: 0.0000 [IU] | Freq: Every day | SUBCUTANEOUS | Status: DC

## 2014-07-19 MED ORDER — WHITE PETROLATUM GEL
Status: AC
  Administered 2014-07-19: 0.2
  Filled 2014-07-19: qty 1

## 2014-07-19 MED ORDER — ATORVASTATIN CALCIUM 80 MG PO TABS
80.0000 mg | ORAL_TABLET | Freq: Every evening | ORAL | Status: DC
  Administered 2014-07-19 – 2014-07-21 (×3): 80 mg via ORAL
  Filled 2014-07-19 (×3): qty 1

## 2014-07-19 MED ORDER — INSULIN ASPART 100 UNIT/ML ~~LOC~~ SOLN
0.0000 [IU] | Freq: Three times a day (TID) | SUBCUTANEOUS | Status: DC
  Administered 2014-07-19: 5 [IU] via SUBCUTANEOUS
  Administered 2014-07-19: 3 [IU] via SUBCUTANEOUS
  Administered 2014-07-20: 2 [IU] via SUBCUTANEOUS
  Administered 2014-07-20: 3 [IU] via SUBCUTANEOUS
  Administered 2014-07-20: 4 [IU] via SUBCUTANEOUS
  Administered 2014-07-21: 3 [IU] via SUBCUTANEOUS
  Administered 2014-07-21 – 2014-07-22 (×3): 2 [IU] via SUBCUTANEOUS
  Administered 2014-07-22: 5 [IU] via SUBCUTANEOUS

## 2014-07-19 NOTE — Progress Notes (Signed)
  Echocardiogram 2D Echocardiogram has been performed.  Beth Padilla 07/19/2014, 10:36 AM

## 2014-07-19 NOTE — Progress Notes (Signed)
VASCULAR LAB PRELIMINARY  PRELIMINARY  PRELIMINARY  PRELIMINARY  Bilateral lower extremity venous Dopplers completed.    Preliminary report:  There is no DVT or SVT noted in the bilateral lower extremities.   Beth Padilla, RVT 07/19/2014, 5:19 PM

## 2014-07-19 NOTE — Evaluation (Signed)
Physical Therapy Evaluation Patient Details Name: Beth Padilla MRN: [id number] DOB: Aug 31, 1940 Today's Date: 07/19/2014   History of Present Illness  74 year old female who  has a past medical history of Diabetes mellitus; Hypertension; Hypothyroidism (12/15/2010); Hyperlipidemia (12/15/2010); Anemia; Depression; and Renal disorder. MRI revealed acute frontal lobe infarct  Clinical Impression  Pt admitted for above. Pt with generalized weakness and deconditioning and is at increased falls risk. Pt requires RW for safe ambulation at this time. Pt to require 24/7 assist for safe d/c home with family. Family reports they can provide. If not pt with need CIR or SNF upon d/c.    Follow Up Recommendations Home health PT;Supervision/Assistance - 24 hour    Equipment Recommendations  Rolling walker with 5" wheels (shower seat)    Recommendations for Other Services       Precautions / Restrictions Precautions Precautions: Fall Restrictions Weight Bearing Restrictions: No      Mobility  Bed Mobility               General bed mobility comments: pt up in chair upon PT arrival  Transfers Overall transfer level: Needs assistance Equipment used: Rolling walker (2 wheeled) Transfers: Sit to/from Stand Sit to Stand: Min guard         General transfer comment: v/c's to push up from chair  Ambulation/Gait Ambulation/Gait assistance: Min guard;Min assist Ambulation Distance (Feet): 120 Feet Assistive device: Rolling walker (2 wheeled);None Gait Pattern/deviations: Step-through pattern;Decreased stride length;Shuffle Gait velocity: slow   General Gait Details: pt initially desired to amb without AD. pt with short shuffled steps and minimal foot clearance and extremely decreased cadence. pt given RW, pt with increased step length adn stability. Daughter-in-law agreed pt safer with RW despite patient reporting "i feel fine without it"  Network engineer Rankin (Stroke Patients Only) Modified Rankin (Stroke Patients Only) Pre-Morbid Rankin Score: No significant disability Modified Rankin: Moderately severe disability     Balance Overall balance assessment: Needs assistance Sitting-balance support: Feet supported;No upper extremity supported Sitting balance-Leahy Scale: Fair     Standing balance support: No upper extremity supported Standing balance-Leahy Scale: Fair Standing balance comment: static standing pt is fine but during functional activity pt requires external support                             Pertinent Vitals/Pain Pain Assessment: No/denies pain    Home Living Family/patient expects to be discharged to:: Private residence Living Arrangements: Children Available Help at Discharge: Family;Available 24 hours/day Type of Home: House Home Access: Stairs to enter Entrance Stairs-Rails: Can reach both Entrance Stairs-Number of Steps: 8 Home Layout: One level Home Equipment: Cane - single point      Prior Function Level of Independence: Independent               Hand Dominance   Dominant Hand:  (ambidextrous but writes with RIght)    Extremity/Trunk Assessment   Upper Extremity Assessment: Overall WFL for tasks assessed           Lower Extremity Assessment: Overall WFL for tasks assessed      Cervical / Trunk Assessment: Normal  Communication   Communication: No difficulties  Cognition Arousal/Alertness: Awake/alert Behavior During Therapy: WFL for tasks assessed/performed Overall Cognitive Status: Impaired/Different from baseline Area of Impairment: Awareness;Safety/judgement         Safety/Judgement: Decreased  awareness of safety Awareness: Intellectual   General Comments: delayed processing    General Comments      Exercises        Assessment/Plan    PT Assessment Patient needs continued PT services  PT Diagnosis Difficulty walking   PT  Problem List Decreased strength;Decreased activity tolerance;Decreased balance;Decreased mobility;Decreased coordination;Decreased knowledge of use of DME;Decreased safety awareness  PT Treatment Interventions DME instruction;Gait training;Stair training;Functional mobility training;Therapeutic activities;Therapeutic exercise;Balance training;Neuromuscular re-education;Cognitive remediation   PT Goals (Current goals can be found in the Care Plan section) Acute Rehab PT Goals Patient Stated Goal: home PT Goal Formulation: With patient Time For Goal Achievement: 07/26/14 Potential to Achieve Goals: Good    Frequency Min 4X/week   Barriers to discharge Decreased caregiver support pt typically home during the day by herself but family reports they can put together 24/7 assist    Co-evaluation               End of Session Equipment Utilized During Treatment: Gait belt Activity Tolerance: Patient tolerated treatment well Patient left: in chair;with call bell/phone within reach;with family/visitor present Nurse Communication: Mobility status         Time: [id number] PT Time Calculation (min) (ACUTE ONLY): 28 min   Charges:   PT Evaluation $Initial PT Evaluation Tier I: 1 Procedure PT Treatments $Gait Training: 8-22 mins   PT G CodesKingsley Callander 07/19/2014, 1:27 PM   Kittie Plater, PT, DPT Pager #: [phone] Office #: [phone]

## 2014-07-19 NOTE — Progress Notes (Signed)
STROKE TEAM PROGRESS NOTE   HISTORY Beth Padilla is a 74 y.o. female who was at home with family today. The last time she spoke to someone normally was about 1330. When her son came out on the porch to talk to her at about 1530 he noted that her speech was slurred and her face was drooped.Later in the day when family went to check on her she would not answer her phone or the door. When they finally did access her, she continued to have slurred speech. Patient was brought to [facility] and code stroke was called.   Date last known well: Date: 07/18/2014 Time last known well: Time: 13:30 tPA Given: No: Outside time window   SUBJECTIVE (INTERVAL HISTORY) The patient's daughter was at the bedside. She feels her mother is improving.   OBJECTIVE Temp:  [98.3 F (36.8 C)-99.1 F (37.3 C)] 98.4 F (36.9 C) (07/03 0600) Pulse Rate:  [51-80] 52 (07/03 0807) Cardiac Rhythm:  [-]  Resp:  [15-20] 18 (07/03 0807) BP: (156-189)/(43-79) 162/57 mmHg (07/03 0807) SpO2:  [96 %-100 %] 100 % (07/03 0807) Weight:  [77.565 kg (171 lb)] 77.565 kg (171 lb) (07/02 2048)   Recent Labs Lab 07/18/14 2043  GLUCAP 223*    Recent Labs Lab 07/18/14 2100 07/19/14 0021  NA 139  --   K 3.7  --   CL 111  --   CO2 19*  --   GLUCOSE 227*  --   BUN 56*  --   CREATININE 1.89* 1.81*  CALCIUM 8.5*  --     Recent Labs Lab 07/18/14 2100  AST 16  ALT 13*  ALKPHOS 66  BILITOT 0.6  PROT 7.6  ALBUMIN 4.2    Recent Labs Lab 07/18/14 2100 07/19/14 0021  WBC 8.5 7.4  NEUTROABS 4.3  --   HGB 10.9* 10.3*  HCT 35.2* 33.1*  MCV 98.1 96.5  PLT 305 274    Recent Labs Lab 07/18/14 2100 07/19/14 0021 07/19/14 0615  TROPONINI 0.12* 0.11* 0.12*    Recent Labs  07/18/14 2100  LABPROT 13.6  INR 1.02    Recent Labs  07/18/14 2120  COLORURINE YELLOW  LABSPEC 1.010  PHURINE 7.0  GLUCOSEU NEGATIVE  HGBUR NEGATIVE  BILIRUBINUR NEGATIVE  KETONESUR NEGATIVE  PROTEINUR 30*  UROBILINOGEN  0.2  NITRITE NEGATIVE  LEUKOCYTESUR SMALL*       Component Value Date/Time   CHOL 173 07/19/2014 0615   TRIG 100 07/19/2014 0615   HDL 47 07/19/2014 0615   CHOLHDL 3.7 07/19/2014 0615   VLDL 20 07/19/2014 0615   LDLCALC 106* 07/19/2014 0615   Lab Results  Component Value Date   HGBA1C 6.5* 12/16/2010      Component Value Date/Time   LABOPIA NONE DETECTED 07/18/2014 2120   COCAINSCRNUR NONE DETECTED 07/18/2014 2120   LABBENZ NONE DETECTED 07/18/2014 2120   AMPHETMU NONE DETECTED 07/18/2014 2120   THCU NONE DETECTED 07/18/2014 2120   LABBARB NONE DETECTED 07/18/2014 2120     Recent Labs Lab 07/18/14 2100  ETH <5    Imaging   Dg Chest 2 View 07/19/2014     Mild hyperinflation.     Ct Head Wo Contrast 07/18/2014    No acute findings. Mild generalized atrophy and chronic microvascular changes. 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     Mr Jodene Nam Head/brain Wo Cm 07/19/2014     MRI HEAD  1. Acute ischemic linear nonhemorrhagic right frontal lobe  infarct. 2. No other acute intracranial process identified.  3. Generalized cerebral atrophy with chronic microvascular ischemic disease and small remote infarcts as detailed above.  4. Cerebellar tonsillar ectopia of 3 mm.    MRA HEAD    1. No proximal or large vessel occlusion identified within the intracranial circulation. No focal high-grade or correctable stenosis identified within the intracranial circulation.  2. Moderate narrowing of the distal cavernous/supra clinoid segments of the internal carotid arteries bilaterally, slightly worse on the left.  3. Widely patent posterior circulation.        PHYSICAL EXAM NEUROLOGIC:   MENTAL STATUS: awake, alert, oriented, language fluent with mild dysarthria, follows simple commands.  CRANIAL NERVES: pupils equal and reactive to light,extraocular motions, visual fields grossly intact intact, facial sensation intact, and left  facial droop, uvula midlinec, tongue midline MOTOR: normal bulk and tone, Strength - 5/5 throughout SENSORY: normal and symmetric to light touch  COORDINATION: finger-nose-finger normal - heel to shin normal  REFLEXES: deep tendon reflexes present and symmetric GAIT: Deferred at this time     ASSESSMENT/PLAN Ms. Beth Padilla is a 74 y.o. female with history of diabetes mellitus, hypertension, hyperlipidemia, and renal dysfunction  presenting with dysarthria and facial droop. She did not receive IV t-PA due to late presentation.  Stroke:  Non-dominant right frontal lobe infarct.   Resultant improving deficits  MRI  acute ischemic linear nonhemorrhagic right frontal lobe infarct  MRA - no proximal or large vessel occlusion identified within the intracranial circulation  Carotid Doppler pending  2D Echo EF 65-70%.  A patent foramen ovale cannot be excluded.  Bilateral lower extremity duplex - pending  LDL 106  HgbA1c pending  Lovenox for VTE prophylaxis  Diet heart healthy/carb modified Room service appropriate?: Yes; Fluid consistency:: Thin  aspirin 81 mg orally every day prior to admission, now on aspirin 325 mg orally every day  Patient counseled to be compliant with her antithrombotic medications  Ongoing aggressive stroke risk factor management  Therapy recommendations: Pending  Disposition: Pending  Hypertension  Home meds: Apresoline and Procardia  Stable   Hyperlipidemia  Home meds: Lipitor 40 mg daily resumed in hospital  LDL 106, goal < 70  Increase Lipitor to 80 mg daily  Continue statin at discharge  Diabetes  HgbA1c pending, goal < 7.0  Uncontrolled  Other Stroke Risk Factors  Advanced age  Hx stroke/TIA (by MRI)  Family hx stroke (mother)    Other Active Problems  Renal insufficiency  Anemia   PLAN  Await therapy evaluations  Await carotid Dopplers, lower extremity Dopplers, and hemoglobin A1c.  Discharge  home workup is complete.  Follow-up Dr. Leonie Man in 2 months  Hospital day # [place] PA-C [facility] Pager [phone] 07/19/2014, 1:25 PM   Start dual anti plt therapy due to significant stenosis posterior circulation. Leotis Pain   To contact Stroke Continuity provider, please refer to [email or web address] After hours, contact General Neurology

## 2014-07-19 NOTE — Progress Notes (Signed)
VASCULAR LAB PRELIMINARY  PRELIMINARY  PRELIMINARY  PRELIMINARY  Carotid Dopplers completed.    Preliminary report:  1-39% ICA stenosis. Vertebral artery flow not insonated.  Abriel Geesey, RVT 07/19/2014, 5:32 PM

## 2014-07-19 NOTE — Consult Note (Signed)
Referring Physician: [place]    Chief Complaint: Slurred speech, facial droop  HPI: Beth Padilla is an 74 y.o. female who was at home with family today.  The last time she spoke to someone normally was about 1330.  When her son came out on the porch to talk to her at about 1530 he noted that her speech was slurred and her face was drooped.  Later in the day when family went to check on her she would not answer her phone or the door.  When they finally did access her, she continued to have slurred speech.  Patient was brought to [facility] and code stroke was called.    Date last known well: Date: 07/18/2014 Time last known well: Time: 13:30 tPA Given: No: Outside time window  Past Medical History  Diagnosis Date  . Diabetes mellitus   . Hypertension   . Hypothyroidism 12/15/2010  . Hyperlipidemia 12/15/2010  . Anemia   . Depression   . Renal disorder     Past Surgical History  Procedure Laterality Date  . Abdominal hysterectomy      Family History  Problem Relation Age of Onset  . Stroke Mother   . Cancer Sister    Social History:  reports that she has never smoked. She does not have any smokeless tobacco history on file. She reports that she does not drink alcohol or use illicit drugs.  Allergies:  Allergies  Allergen Reactions  . Ampicillin Rash    Medications:  I have reviewed the patient's current medications. Prior to Admission:  Prescriptions prior to admission  Medication Sig Dispense Refill Last Dose  . acetaminophen (TYLENOL) 650 MG CR tablet Take 650 mg by mouth every 8 (eight) hours as needed for pain.   Past Month  . aspirin 81 MG chewable tablet Chew 81 mg by mouth daily.   07/18/2014  . atorvastatin (LIPITOR) 40 MG tablet Take 40 mg by mouth every evening.   07/18/2014  . escitalopram (LEXAPRO) 10 MG tablet Take 10 mg by mouth daily.   07/18/2014  . furosemide (LASIX) 40 MG tablet Take 40 mg by mouth 2 (two) times daily.   07/18/2014  . hydrALAZINE (APRESOLINE) 100  MG tablet Take 100 mg by mouth 3 (three) times daily.   07/18/2014  . ketotifen (ALAWAY) 0.025 % ophthalmic solution Place 1 drop into both eyes daily.   07/18/2014  . levothyroxine (SYNTHROID, LEVOTHROID) 100 MCG tablet Take 100 mcg by mouth daily before breakfast.   07/18/2014  . NIFEdipine (PROCARDIA-XL/ADALAT CC) 30 MG 24 hr tablet Take 30 mg by mouth 2 (two) times daily.   07/18/2014  . omeprazole (PRILOSEC) 20 MG capsule Take 20 mg by mouth daily.   07/18/2014  . PRESCRIPTION MEDICATION Take 1 capsule by mouth daily. Acetazolamide ER 500 MG   07/18/2014  . sodium bicarbonate 650 MG tablet Take 1,300 mg by mouth 2 (two) times daily.   07/18/2014  . traMADol (ULTRAM) 50 MG tablet Take 50 mg by mouth every 6 (six) hours as needed.   07/18/2014   Scheduled: . aspirin  300 mg Rectal Daily   Or  . aspirin  325 mg Oral Daily  . atorvastatin  40 mg Oral QPM  . enoxaparin (LOVENOX) injection  30 mg Subcutaneous QHS  . escitalopram  10 mg Oral Daily  . ketotifen  1 drop Both Eyes Daily  . levothyroxine  100 mcg Oral QAC breakfast  . pantoprazole  40 mg Oral Daily  .  sodium bicarbonate  1,300 mg Oral BID    ROS: History obtained from the patient  General ROS: negative for - chills, fatigue, fever, night sweats, weight gain or weight loss Psychological ROS: negative for - behavioral disorder, hallucinations, memory difficulties, mood swings or suicidal ideation Ophthalmic ROS: negative for - blurry vision, double vision, eye pain or loss of vision ENT ROS: negative for - epistaxis, nasal discharge, oral lesions, sore throat, tinnitus or vertigo Allergy and Immunology ROS: negative for - hives or itchy/watery eyes Hematological and Lymphatic ROS: negative for - bleeding problems, bruising or swollen lymph nodes Endocrine ROS: negative for - galactorrhea, hair pattern changes, polydipsia/polyuria or temperature intolerance Respiratory ROS: negative for - cough, hemoptysis, shortness of breath or  wheezing Cardiovascular ROS: negative for - chest pain, dyspnea on exertion, edema or irregular heartbeat Gastrointestinal ROS: negative for - abdominal pain, diarrhea, hematemesis, nausea/vomiting or stool incontinence Genito-Urinary ROS: negative for - dysuria, hematuria, incontinence or urinary frequency/urgency Musculoskeletal ROS: negative for - joint swelling or muscular weakness Neurological ROS: as noted in HPI Dermatological ROS: negative for rash and skin lesion changes  Physical Examination: Blood pressure 178/43, pulse 58, temperature 98.6 F (37 C), temperature source Oral, resp. rate 18, height 5\' 7"  (1.702 m), weight 77.565 kg (171 lb), SpO2 100 %.  HEENT-  Normocephalic, no lesions, without obvious abnormality.  Normal external eye and conjunctiva.  Normal TM's bilaterally.  Normal auditory canals and external ears. Normal external nose, mucus membranes and septum.  Normal pharynx. Cardiovascular- S1, S2 normal, + murmur, pulses palpable throughout   Lungs- chest clear, no wheezing, rales, normal symmetric air entry Abdomen- soft, non-tender; bowel sounds normal; no masses,  no organomegaly Extremities- no edema Lymph-no adenopathy palpable Musculoskeletal-no joint tenderness, deformity or swelling Skin-warm and dry, no hyperpigmentation, vitiligo, or suspicious lesions  Neurological Examination Mental Status: Alert, oriented, thought content appropriate.  Speech fluent without evidence of aphasia.  Speech slurred.  Able to follow 3 step commands without difficulty. Cranial Nerves: II: Discs flat bilaterally; Visual fields grossly normal, pupils equal, round, reactive to light and accommodation III,IV, VI: left ptosis, extra-ocular motions intact bilaterally V,VII: left facial droop, facial light touch sensation normal bilaterally VIII: hearing normal bilaterally IX,X: gag reflex present XI: bilateral shoulder shrug XII: tongue extension to the right Motor: Right  : Upper extremity   5/5    Left:     Upper extremity   5/5  Lower extremity   5/5     Lower extremity   5/5 Tone and bulk:normal tone throughout; no atrophy noted Sensory: Pinprick and light touch intact throughout, bilaterally Deep Tendon Reflexes: 2+ and symmetric throughout Plantars: Right: equivocal   Left: downgoing Cerebellar: normal finger-to-nose and normal heel-to-shin testing bilaterally Gait: not tested due to safety concerns   Laboratory Studies:  Basic Metabolic Panel:  Recent Labs Lab 07/18/14 2100  NA 139  K 3.7  CL 111  CO2 19*  GLUCOSE 227*  BUN 56*  CREATININE 1.89*  CALCIUM 8.5*    Liver Function Tests:  Recent Labs Lab 07/18/14 2100  AST 16  ALT 13*  ALKPHOS 66  BILITOT 0.6  PROT 7.6  ALBUMIN 4.2   No results for input(s): LIPASE, AMYLASE in the last 168 hours. No results for input(s): AMMONIA in the last 168 hours.  CBC:  Recent Labs Lab 07/18/14 2100  WBC 8.5  NEUTROABS 4.3  HGB 10.9*  HCT 35.2*  MCV 98.1  PLT 305    Cardiac Enzymes:  Recent Labs Lab 07/18/14 2100  TROPONINI 0.12*    BNP: Invalid input(s): POCBNP  CBG:  Recent Labs Lab 07/18/14 2043  GLUCAP 80*    Microbiology: Results for orders placed or performed during the hospital encounter of 09/14/13  MRSA PCR Screening     Status: None   Collection Time: 09/14/13  3:10 PM  Result Value Ref Range Status   MRSA by PCR NEGATIVE NEGATIVE Final    Comment:        The GeneXpert MRSA Assay (FDA approved for NASAL specimens only), is one component of a comprehensive MRSA colonization surveillance program. It is not intended to diagnose MRSA infection nor to guide or monitor treatment for MRSA infections.  Culture, Urine     Status: None   Collection Time: 09/14/13  4:10 PM  Result Value Ref Range Status   Specimen Description URINE, CLEAN CATCH  Final   Special Requests NONE  Final   Culture  Setup Time   Final    09/15/2013 14:19 Performed at  [place] Performed at [facility]  Final   Culture NO GROWTH Performed at [facility]  Final   Report Status 09/16/2013 FINAL  Final    Coagulation Studies:  Recent Labs  07/18/14 2100  LABPROT 13.6  INR 1.02    Urinalysis:  Recent Labs Lab 07/18/14 2120  COLORURINE YELLOW  LABSPEC 1.010  PHURINE 7.0  GLUCOSEU NEGATIVE  HGBUR NEGATIVE  BILIRUBINUR NEGATIVE  KETONESUR NEGATIVE  PROTEINUR 30*  UROBILINOGEN 0.2  NITRITE NEGATIVE  LEUKOCYTESUR SMALL*    Lipid Panel: No results found for: CHOL, TRIG, HDL, CHOLHDL, VLDL, LDLCALC  HgbA1C:  Lab Results  Component Value Date   HGBA1C 6.5* 12/16/2010    Urine Drug Screen:     Component Value Date/Time   LABOPIA NONE DETECTED 07/18/2014 2120   COCAINSCRNUR NONE DETECTED 07/18/2014 2120   LABBENZ NONE DETECTED 07/18/2014 2120   AMPHETMU NONE DETECTED 07/18/2014 2120   THCU NONE DETECTED 07/18/2014 2120   LABBARB NONE DETECTED 07/18/2014 2120    Alcohol Level:  Recent Labs Lab 07/18/14 2100  ETH <5    Other results: EKG: junctional rhythm, 68 bpm.  Imaging: Ct Head Wo Contrast  07/18/2014   CLINICAL DATA:  Right-sided facial droop and slurred speech.  EXAM: CT HEAD WITHOUT CONTRAST  TECHNIQUE: Contiguous axial images were obtained from the base of the skull through the vertex without intravenous contrast.  COMPARISON:  08/11/2004  FINDINGS: There is no intracranial hemorrhage, mass or evidence of acute infarction. There is hypodensity in the left corona radiata and posterior left lentiform nuclei which may represent old infarction in there also was hypodensity in the posterior left lentiform nuclei on the 2006 examination. There is mild generalized atrophy. There is mild microvascular ischemic change. Posterior fossa and brainstem appear unremarkable.  There is no bony abnormality. The visible paranasal sinuses are clear.  IMPRESSION: No acute findings. Mild  generalized atrophy and chronic microvascular changes. 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 These results were called by telephone at the time of interpretation on 07/18/2014 at 9:19 pm to Dr. Francine Graven , who verbally acknowledged these results.   Electronically Signed   By: Andreas Newport M.D.   On: 07/18/2014 21:21    Assessment: 74 y.o. female presenting with acute onset left facial droop and slurred speech.  Head CT personally reviewed and shows no acute changes.  Patient with multiple vascular risk factors.  Patient not a tPA candidate secondary to time of presentation.  Further work up recommended.  On ASA at home.    Stroke Risk Factors - diabetes mellitus, hyperlipidemia and hypertension  Plan: 1. HgbA1c, fasting lipid panel 2. MRI, MRA  of the brain without contrast 3. PT consult, OT consult, Speech consult 4. Echocardiogram 5. Carotid dopplers 6. Prophylactic therapy-Antiplatelet med: Aspirin - dose 325mg  daily 7. NPO until RN stroke swallow screen 8. Telemetry monitoring 9. Frequent neuro checks   Alexis Goodell, MD [facility] [phone] 07/19/2014, 12:39 AM

## 2014-07-19 NOTE — Progress Notes (Addendum)
[facility] PROGRESS NOTE  Beth Padilla [id number] DOB: 20-Feb-1940 DOA: 07/18/2014  PCP: Kendrick Ranch, MD  Brief HPI: 74 year old African-American female with a past medical history of diabetes, hypertension, hypothyroidism, hyperlipidemia, presented with slurred speech. There was no extremity weakness. Patient was hospitalized for further management.  Past medical history:  Past Medical History  Diagnosis Date  . Diabetes mellitus   . Hypertension   . Hypothyroidism 12/15/2010  . Hyperlipidemia 12/15/2010  . Anemia   . Depression   . Renal disorder     Consultants: Neurology  Procedures:  2-D echocardiogram is pending  Carotid Dopplers pending  Antibiotics: None  Subjective: Patient feels better this morning. Her speech is almost back to normal. Continues to have some droop on the left side of the face. Denies any weakness in any one side.  Objective: Vital Signs  Filed Vitals:   07/19/14 0201 07/19/14 0347 07/19/14 0600 07/19/14 0807  BP: 157/47 156/51 163/50 162/57  Pulse: 64 56 51 52  Temp: 99.1 F (37.3 C) 98.6 F (37 C) 98.4 F (36.9 C)   TempSrc: Oral Oral Oral   Resp: 16 20 18 18   Height:      Weight:      SpO2: 96% 100% 100% 100%   No intake or output data in the 24 hours ending 07/19/14 0924 Filed Weights   07/18/14 2048  Weight: 77.565 kg (171 lb)    General appearance: alert, cooperative, appears stated age and no distress Resp: clear to auscultation bilaterally Cardio: S1, S2, is bradycardic, regular. No S3, S4. No rubs, or bruit. Systolic murmur appreciated over the precordium. GI: soft, non-tender; bowel sounds normal; no masses,  no organomegaly Extremities: extremities normal, atraumatic, no cyanosis or edema Neurologic: Alert and oriented 3. Left-sided facial droop noted. Tongue is midline. Strength is equal and normal bilateral upper and lower extremities in all muscle groups.  Lab Results:  Basic  Metabolic Panel:  Recent Labs Lab 07/18/14 2100 07/19/14 0021  NA 139  --   K 3.7  --   CL 111  --   CO2 19*  --   GLUCOSE 227*  --   BUN 56*  --   CREATININE 1.89* 1.81*  CALCIUM 8.5*  --    Liver Function Tests:  Recent Labs Lab 07/18/14 2100  AST 16  ALT 13*  ALKPHOS 66  BILITOT 0.6  PROT 7.6  ALBUMIN 4.2   CBC:  Recent Labs Lab 07/18/14 2100 07/19/14 0021  WBC 8.5 7.4  NEUTROABS 4.3  --   HGB 10.9* 10.3*  HCT 35.2* 33.1*  MCV 98.1 96.5  PLT 305 274   Cardiac Enzymes:  Recent Labs Lab 07/18/14 2100 07/19/14 0021 07/19/14 0615  TROPONINI 0.12* 0.11* 0.12*   CBG:  Recent Labs Lab 07/18/14 2043  GLUCAP 223*      Studies/Results: Dg Chest 2 View  07/19/2014   CLINICAL DATA:  Cerebral infarction  EXAM: CHEST  2 VIEW  COMPARISON:  09/14/2013  FINDINGS: There is mild hyperinflation. There is mild interstitial coarsening, likely chronic. There is no confluent airspace opacity. There is no effusion. Heart size is normal.  IMPRESSION: Mild hyperinflation.   Electronically Signed   By: Andreas Newport M.D.   On: 07/19/2014 03:34   Ct Head Wo Contrast  07/18/2014   CLINICAL DATA:  Right-sided facial droop and slurred speech.  EXAM: CT HEAD WITHOUT CONTRAST  TECHNIQUE: Contiguous axial images were obtained from the base of the skull through the  vertex without intravenous contrast.  COMPARISON:  08/11/2004  FINDINGS: There is no intracranial hemorrhage, mass or evidence of acute infarction. There is hypodensity in the left corona radiata and posterior left lentiform nuclei which may represent old infarction in there also was hypodensity in the posterior left lentiform nuclei on the 2006 examination. There is mild generalized atrophy. There is mild microvascular ischemic change. Posterior fossa and brainstem appear unremarkable.  There is no bony abnormality. The visible paranasal sinuses are clear.  IMPRESSION: No acute findings. Mild generalized atrophy and  chronic microvascular changes. 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 These results were called by telephone at the time of interpretation on 07/18/2014 at 9:19 pm to Dr. Francine Graven , who verbally acknowledged these results.   Electronically Signed   By: Andreas Newport M.D.   On: 07/18/2014 21:21   Mr Brain Wo Contrast  07/19/2014   CLINICAL DATA:  Initial evaluation for acute slurred speech.  EXAM: MRI HEAD WITHOUT CONTRAST  MRA HEAD WITHOUT CONTRAST  TECHNIQUE: Multiplanar, multiecho pulse sequences of the brain and surrounding structures were obtained without intravenous contrast. Angiographic images of the head were obtained using MRA technique without contrast.  COMPARISON:  Prior CT from 07/18/2014  FINDINGS: MRI HEAD FINDINGS  Diffuse prominence of the CSF containing spaces is compatible with generalized cerebral atrophy. Patchy T2/FLAIR hyperintensity within the periventricular and deep white matter both cerebral hemispheres present, most consistent with chronic small vessel ischemic disease.  Remote lacunar infarct involving the left lentiform nuclei S extending superiorly through the periventricular white matter left corona radiata present. There is an additional small remote cortical infarct within the left parietal lobe. Probable additional tiny remote lacunar infarct within the right lentiform nucleus with associated chronic blood products.  There is a linear focus of restricted diffusion involving the cortical gray matter and underlying deep white matter of the right frontal lobe, compatible with acute nonhemorrhagic ischemic infarct (series 4, image 33). No associated mass effect or hemorrhage. No other cerebral infarction. Normal intravascular flow voids are maintained.  No mass lesion, midline shift, or mass effect. No hydrocephalus. No extra-axial fluid collection.  Cerebellar tonsils are somewhat low lying and PEG at the foramen magnum.  There positioned approximately 3 mm below the foramen magnum without frank Chiari malformation. Craniocervical junction otherwise unremarkable. Degenerative spondylolysis noted within the visualized upper cervical spine. 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  No acute abnormality about the orbits.  Scattered mucosal thickening present within the ethmoidal air cells, sphenoid sinuses, and maxillary sinuses. No air-fluid levels to suggest active sinus infection. Minimal scattered fluid opacity present within the mastoid air cells bilaterally. Inner ear structures within normal limits.  Bone marrow signal intensity within normal limits. Scalp soft tissues unremarkable.  MRA HEAD FINDINGS  ANTERIOR CIRCULATION:  Visualized distal cervical segments of the internal carotid arteries are widely patent with antegrade flow. The internal carotid arteries appear medialized into the retropharyngeal space. Petrous segments are widely patent. Moderate narrowing of the distal cavernous/supra clinoid segments of the internal carotid arteries is present bilaterally, slightly worse on the left. No focal high-grade stenosis. A1 segments, anterior communicating artery and anterior cerebral arteries well opacified. M1 segments widely patent bilaterally without stenosis or occlusion. MCA bifurcations within normal limits. Distal MCA branches well opacified bilaterally.  POSTERIOR CIRCULATION:  Vertebral arteries are widely patent to the vertebrobasilar junction. Posterior inferior cerebellar arteries are patent bilaterally. Basilar artery widely patent. Anterior inferior cerebral  arteries and superior cerebellar arteries are well opacified bilaterally. Posterior cerebral arteries demonstrate mild multi focal atheromatous irregularity without significant stenosis.  No aneurysm or vascular malformation.  IMPRESSION: MRI HEAD IMPRESSION:  1. Acute ischemic linear nonhemorrhagic right frontal lobe infarct as above. 2. No other acute  intracranial process identified. 3. Generalized cerebral atrophy with chronic microvascular ischemic disease and small remote infarcts as detailed above. 4. Cerebellar tonsillar ectopia of 3 mm.  MRA HEAD IMPRESSION:  1. No proximal or large vessel occlusion identified within the intracranial circulation. No focal high-grade or correctable stenosis identified within the intracranial circulation. 2. Moderate narrowing of the distal cavernous/supra clinoid segments of the internal carotid arteries bilaterally, slightly worse on the left. 3. Widely patent posterior circulation.   Electronically Signed   By: Jeannine Boga M.D.   On: 07/19/2014 05:14   Mr Jodene Nam Head/brain Wo Cm  07/19/2014   CLINICAL DATA:  Initial evaluation for acute slurred speech.  EXAM: MRI HEAD WITHOUT CONTRAST  MRA HEAD WITHOUT CONTRAST  TECHNIQUE: Multiplanar, multiecho pulse sequences of the brain and surrounding structures were obtained without intravenous contrast. Angiographic images of the head were obtained using MRA technique without contrast.  COMPARISON:  Prior CT from 07/18/2014  FINDINGS: MRI HEAD FINDINGS  Diffuse prominence of the CSF containing spaces is compatible with generalized cerebral atrophy. Patchy T2/FLAIR hyperintensity within the periventricular and deep white matter both cerebral hemispheres present, most consistent with chronic small vessel ischemic disease.  Remote lacunar infarct involving the left lentiform nuclei S extending superiorly through the periventricular white matter left corona radiata present. There is an additional small remote cortical infarct within the left parietal lobe. Probable additional tiny remote lacunar infarct within the right lentiform nucleus with associated chronic blood products.  There is a linear focus of restricted diffusion involving the cortical gray matter and underlying deep white matter of the right frontal lobe, compatible with acute nonhemorrhagic ischemic infarct (series  4, image 33). No associated mass effect or hemorrhage. No other cerebral infarction. Normal intravascular flow voids are maintained.  No mass lesion, midline shift, or mass effect. No hydrocephalus. No extra-axial fluid collection.  Cerebellar tonsils are somewhat low lying and PEG at the foramen magnum. There positioned approximately 3 mm below the foramen magnum without frank Chiari malformation. Craniocervical junction otherwise unremarkable. Degenerative spondylolysis noted within the visualized upper cervical spine. 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  No acute abnormality about the orbits.  Scattered mucosal thickening present within the ethmoidal air cells, sphenoid sinuses, and maxillary sinuses. No air-fluid levels to suggest active sinus infection. Minimal scattered fluid opacity present within the mastoid air cells bilaterally. Inner ear structures within normal limits.  Bone marrow signal intensity within normal limits. Scalp soft tissues unremarkable.  MRA HEAD FINDINGS  ANTERIOR CIRCULATION:  Visualized distal cervical segments of the internal carotid arteries are widely patent with antegrade flow. The internal carotid arteries appear medialized into the retropharyngeal space. Petrous segments are widely patent. Moderate narrowing of the distal cavernous/supra clinoid segments of the internal carotid arteries is present bilaterally, slightly worse on the left. No focal high-grade stenosis. A1 segments, anterior communicating artery and anterior cerebral arteries well opacified. M1 segments widely patent bilaterally without stenosis or occlusion. MCA bifurcations within normal limits. Distal MCA branches well opacified bilaterally.  POSTERIOR CIRCULATION:  Vertebral arteries are widely patent to the vertebrobasilar junction. Posterior inferior cerebellar arteries are patent bilaterally. Basilar artery widely patent. Anterior inferior cerebral arteries and superior cerebellar arteries are  well  opacified bilaterally. Posterior cerebral arteries demonstrate mild multi focal atheromatous irregularity without significant stenosis.  No aneurysm or vascular malformation.  IMPRESSION: MRI HEAD IMPRESSION:  1. Acute ischemic linear nonhemorrhagic right frontal lobe infarct as above. 2. No other acute intracranial process identified. 3. Generalized cerebral atrophy with chronic microvascular ischemic disease and small remote infarcts as detailed above. 4. Cerebellar tonsillar ectopia of 3 mm.  MRA HEAD IMPRESSION:  1. No proximal or large vessel occlusion identified within the intracranial circulation. No focal high-grade or correctable stenosis identified within the intracranial circulation. 2. Moderate narrowing of the distal cavernous/supra clinoid segments of the internal carotid arteries bilaterally, slightly worse on the left. 3. Widely patent posterior circulation.   Electronically Signed   By: Jeannine Boga M.D.   On: 07/19/2014 05:14    Medications:  Scheduled: . aspirin  300 mg Rectal Daily   Or  . aspirin  325 mg Oral Daily  . atorvastatin  40 mg Oral QPM  . enoxaparin (LOVENOX) injection  30 mg Subcutaneous QHS  . escitalopram  10 mg Oral Daily  . insulin aspart  0-15 Units Subcutaneous TID WC  . insulin aspart  0-5 Units Subcutaneous QHS  . ketotifen  1 drop Both Eyes Daily  . lactose free nutrition  237 mL Oral TID WC  . levothyroxine  100 mcg Oral QAC breakfast  . pantoprazole  40 mg Oral Daily  . sodium bicarbonate  1,300 mg Oral BID   Continuous: . sodium chloride 75 mL/hr at 07/19/14 0000   PRN:  Assessment/Plan:  Active Problems:   HTN (hypertension)   Hypothyroidism   Stroke    Acute stroke involving the right hemisphere Speech is improving. Neurology is following. Continue aspirin for now. Continue statin. Carotid Doppler and echocardiogram pending. PT and OT to evaluate patient. HbA1c is pending. LDL is 106.  Minimally elevated troponin Likely  related to the above. Patient denies any chest pain. EKG shows nonspecific T-wave changes. Await echocardiogram. Continue aspirin.  Bradycardia Appears to be sinus. She is not on any AV nodal blocking agents. She does have a history of hypothyroidism. Check TSH and free T4. Continue to monitor on telemetry. She is asymptomatic. Her records were reviewed. She's had low heart rates in the past as well. She used to be on a beta blocker which has been discontinued as of last year.  Chronic kidney disease stage III Stable. Creatinine better than baseline. Continue bicarbonate.  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 Initiate sliding scale coverage. HbA1c is pending. No diabetic meds listed on home med list. Will clarify with family.  Hypothyroidism Continue Synthroid. TSH and free T4 to be checked as discussed above.  Essential hypertension Blood pressure stable. Continue to monitor  DVT Prophylaxis: Lovenox    Code Status: Full code  Family Communication: Discussed with the patient and her daughter who was at bedside  Disposition Plan: Await stroke workup to be completed. PT and OT to evaluate.    LOS: 1 day   [facility] Pager [phone] 07/19/2014, 9:24 AM  If 7PM-7AM, please contact night-coverage at [email or web address], password [facility]

## 2014-07-20 DIAGNOSIS — R7989 Other specified abnormal findings of blood chemistry: Secondary | ICD-10-CM

## 2014-07-20 DIAGNOSIS — R778 Other specified abnormalities of plasma proteins: Secondary | ICD-10-CM | POA: Insufficient documentation

## 2014-07-20 LAB — GLUCOSE, CAPILLARY
Glucose-Capillary: 134 mg/dL — ABNORMAL HIGH (ref 65–99)
Glucose-Capillary: 188 mg/dL — ABNORMAL HIGH (ref 65–99)
Glucose-Capillary: 176 mg/dL — ABNORMAL HIGH (ref 65–99)
Glucose-Capillary: 129 mg/dL — ABNORMAL HIGH (ref 65–99)

## 2014-07-20 LAB — BASIC METABOLIC PANEL
ANION GAP: 10 (ref 5–15)
BUN: 39 mg/dL — AB (ref 6–20)
CALCIUM: 8.3 mg/dL — AB (ref 8.9–10.3)
CO2: 18 mmol/L — ABNORMAL LOW (ref 22–32)
Chloride: 112 mmol/L — ABNORMAL HIGH (ref 101–111)
Creatinine, Ser: 1.67 mg/dL — ABNORMAL HIGH (ref 0.44–1.00)
GFR calc non Af Amer: 29 mL/min — ABNORMAL LOW (ref >60)
GFR, EST AFRICAN AMERICAN: 33 mL/min — AB (ref >60)
Glucose, Bld: 146 mg/dL — ABNORMAL HIGH (ref 65–99)
POTASSIUM: 3.5 mmol/L (ref 3.5–5.1)
Sodium: 140 mmol/L (ref 135–145)

## 2014-07-20 LAB — CBC
HCT: 28.1 % — ABNORMAL LOW (ref 36.0–46.0)
HEMOGLOBIN: 8.7 g/dL — AB (ref 12.0–15.0)
MCH: 29.7 pg (ref 26.0–34.0)
MCHC: 31 g/dL (ref 30.0–36.0)
MCV: 95.9 fL (ref 78.0–100.0)
Platelets: 227 10*3/uL (ref 150–400)
RBC: 2.93 MIL/uL — AB (ref 3.87–5.11)
RDW: 15.1 % (ref 11.5–15.5)
WBC: 6.3 10*3/uL (ref 4.0–10.5)

## 2014-07-20 MED ORDER — HYDRALAZINE HCL 20 MG/ML IJ SOLN
10.0000 mg | Freq: Four times a day (QID) | INTRAMUSCULAR | Status: DC | PRN
  Administered 2014-07-20 – 2014-07-22 (×3): 10 mg via INTRAVENOUS
  Filled 2014-07-20 (×4): qty 1

## 2014-07-20 MED ORDER — ASPIRIN EC 325 MG PO TBEC: 325.0000 mg | DELAYED_RELEASE_TABLET | Freq: Every day | ORAL | Status: DC

## 2014-07-20 MED ORDER — ENOXAPARIN SODIUM 40 MG/0.4ML ~~LOC~~ SOLN
40.0000 mg | Freq: Every day | SUBCUTANEOUS | Status: DC
  Administered 2014-07-20 – 2014-07-21 (×2): 40 mg via SUBCUTANEOUS
  Filled 2014-07-20 (×2): qty 0.4

## 2014-07-20 MED ORDER — ATORVASTATIN CALCIUM 80 MG PO TABS: 80.0000 mg | ORAL_TABLET | Freq: Every evening | ORAL | Status: DC

## 2014-07-20 MED ORDER — NIFEDIPINE ER 30 MG PO TB24: 30.0000 mg | ORAL_TABLET | Freq: Two times a day (BID) | ORAL | Status: DC

## 2014-07-20 MED ORDER — NIFEDIPINE ER 30 MG PO TB24
30.0000 mg | ORAL_TABLET | Freq: Two times a day (BID) | ORAL | Status: DC
  Administered 2014-07-20 – 2014-07-22 (×4): 30 mg via ORAL
  Filled 2014-07-20 (×6): qty 1

## 2014-07-20 MED ORDER — HYDRALAZINE HCL 100 MG PO TABS
100.0000 mg | ORAL_TABLET | Freq: Three times a day (TID) | ORAL | Status: DC
  Administered 2014-07-20 – 2014-07-22 (×6): 100 mg via ORAL
  Filled 2014-07-20 (×9): qty 1

## 2014-07-20 NOTE — Evaluation (Signed)
Speech Language Pathology Evaluation Patient Details Name: Beth Padilla MRN: [id number] DOB: Nov 07, 1940 Today's Date: 07/20/2014 Time: [id number]     Problem List:  Patient Active Problem List   Diagnosis Date Noted  . CKD (chronic kidney disease), stage III 07/19/2014  . Stroke 07/18/2014  . Acute renal failure superimposed on stage 3 chronic kidney disease 09/16/2013  . CHF (congestive heart failure) 09/14/2013  . Bradycardia 09/14/2013  . UTI (urinary tract infection) 09/14/2013  . DM type 2 (diabetes mellitus, type 2) 12/15/2010  . HTN (hypertension) 12/15/2010  . Anemia 12/15/2010  . Hypothyroidism 12/15/2010  . Hyperlipidemia 12/15/2010   Past Medical History:  Past Medical History  Diagnosis Date  . Diabetes mellitus   . Hypertension   . Hypothyroidism 12/15/2010  . Hyperlipidemia 12/15/2010  . Anemia   . Depression   . Renal disorder    Past Surgical History:  Past Surgical History  Procedure Laterality Date  . Abdominal hysterectomy     HPI:  74 year old female who has a past medical history of Diabetes mellitus; Hypertension; Hypothyroidism (12/15/2010); Hyperlipidemia (12/15/2010); Anemia; Depression; and Renal disorder. MRI revealed acute frontal lobe infarct   Assessment / Plan / Recommendation Clinical Impression  Cognitive-linguistic evaluation complete with administration of the Cognistat. Patient scoring [facility] for all areas with the exception of mathmatical reasoning (moderate deficit) and analytical reasoning (severe deficit) which patient and family state is not baseline. Mild dysarthria also remains present. No skilled f/u needed in the acute setting however recommend [place] f/u after d/c. Educated patient and family on recommendations for 24 hour supervision after d/c to monitor patient accuracy with higher level cognitive tasks for safety (ie, medication management, bill pay, etc). Verbalized understanding.     SLP Assessment  All further Speech  [place] Pathology  needs can be addressed in the next venue of care    Follow Up Recommendations  Home health SLP       Pertinent Vitals/Pain Pain Assessment: No/denies pain       SLP Evaluation Prior Functioning  Cognitive/Linguistic Baseline: Within functional limits Type of Home: House Available Help at Discharge: Family;Available 24 hours/day   Cognition  Overall Cognitive Status: Impaired/Different from baseline Arousal/Alertness: Awake/alert Orientation Level: Oriented X4 Attention: Alternating Alternating Attention: Appears intact Memory: Appears intact Awareness: Appears intact Problem Solving: Appears intact Executive Function: Reasoning Reasoning: Impaired Reasoning Impairment: Functional complex Safety/Judgment: Appears intact    Comprehension  Auditory Comprehension Overall Auditory Comprehension: Appears within functional limits for tasks assessed Visual Recognition/Discrimination Discrimination: Within Function Limits Reading Comprehension Reading Status: Not tested    Expression Expression Primary Mode of Expression: Verbal Verbal Expression Overall Verbal Expression: Appears within functional limits for tasks assessed Written Expression Dominant Hand:  (ambidextrous but writes with RIght)   Oral / Motor Oral Motor/Sensory Function Overall Oral Motor/Sensory Function: Impaired Labial ROM: Reduced left Labial Symmetry: Within Functional Limits Labial Strength: Within Functional Limits Labial Sensation: Within Functional Limits Lingual ROM: Within Functional Limits Lingual Symmetry: Within Functional Limits Lingual Strength: Within Functional Limits Lingual Sensation: Within Functional Limits Facial ROM: Within Functional Limits Facial Symmetry: Within Functional Limits Facial Strength: Within Functional Limits Facial Sensation: Within Functional Limits Velum: Within Functional Limits Mandible: Within Functional Limits Motor Speech Overall  Motor Speech: Impaired Respiration: Within functional limits Phonation: Normal Resonance: Within functional limits Articulation: Impaired Level of Impairment: Sentence Intelligibility: Intelligibility reduced Word: 75-100% accurate Phrase: 75-100% accurate Sentence: 75-100% accurate Conversation: 75-100% accurate Motor Planning: Witnin functional limits Effective Techniques: Increased vocal intensity;Slow  rate;Over-articulate   GO   Gabriel Rainwater MA, CCC-SLP [phone]   Gabriel Rainwater Meryl 07/20/2014, 11:50 AM

## 2014-07-20 NOTE — Care Management (Signed)
Important Message  Patient Details  Name: Beth Padilla MRN: [id number] Date of Birth: [birth year]   Medicare Important Message Given:  Yes-second notification given    Loann Quill 07/20/2014, 9:56 AM

## 2014-07-20 NOTE — Progress Notes (Signed)
STROKE TEAM PROGRESS NOTE   HISTORY Beth Padilla is a 74 y.o. female who was at home with family today. The last time she spoke to someone normally was about 1330. When her son came out on the porch to talk to her at about 1530 he noted that her speech was slurred and her face was drooped.Later in the day when family went to check on her she would not answer her phone or the door. When they finally did access her, she continued to have slurred speech. Patient was brought to [facility] and code stroke was called.   Date last known well: Date: 07/18/2014 Time last known well: Time: 13:30 tPA Given: No: Outside time window   SUBJECTIVE (INTERVAL HISTORY) The patient's daughter was at the bedside. She feels her mother is improving. She has no new complaints today   OBJECTIVE Temp:  [97.6 F (36.4 C)-98.6 F (37 C)] 98.2 F (36.8 C) (07/04 1414) Pulse Rate:  [47-77] 54 (07/04 1414) Cardiac Rhythm:  [-] Sinus bradycardia (07/04 0800) Resp:  [16-19] 18 (07/04 1414) BP: (142-211)/(40-52) 168/40 mmHg (07/04 1414) SpO2:  [93 %-100 %] 100 % (07/04 1414)   Recent Labs Lab 07/19/14 1150 07/19/14 1610 07/19/14 2144 07/20/14 0635 07/20/14 1102  GLUCAP 154* 230* 181* 134* 188*    Recent Labs Lab 07/18/14 2100 07/19/14 0021 07/20/14 0444  NA 139  --  140  K 3.7  --  3.5  CL 111  --  112*  CO2 19*  --  18*  GLUCOSE 227*  --  146*  BUN 56*  --  39*  CREATININE 1.89* 1.81* 1.67*  CALCIUM 8.5*  --  8.3*    Recent Labs Lab 07/18/14 2100  AST 16  ALT 13*  ALKPHOS 66  BILITOT 0.6  PROT 7.6  ALBUMIN 4.2    Recent Labs Lab 07/18/14 2100 07/19/14 0021 07/20/14 0444  WBC 8.5 7.4 6.3  NEUTROABS 4.3  --   --   HGB 10.9* 10.3* 8.7*  HCT 35.2* 33.1* 28.1*  MCV 98.1 96.5 95.9  PLT 305 274 227    Recent Labs Lab 07/18/14 2100 07/19/14 0021 07/19/14 0615 07/19/14 1141  TROPONINI 0.12* 0.11* 0.12* 0.14*    Recent Labs  07/18/14 2100  LABPROT 13.6  INR 1.02     Recent Labs  07/18/14 2120  COLORURINE YELLOW  LABSPEC 1.010  PHURINE 7.0  GLUCOSEU NEGATIVE  HGBUR NEGATIVE  BILIRUBINUR NEGATIVE  KETONESUR NEGATIVE  PROTEINUR 30*  UROBILINOGEN 0.2  NITRITE NEGATIVE  LEUKOCYTESUR SMALL*       Component Value Date/Time   CHOL 173 07/19/2014 0615   TRIG 100 07/19/2014 0615   HDL 47 07/19/2014 0615   CHOLHDL 3.7 07/19/2014 0615   VLDL 20 07/19/2014 0615   LDLCALC 106* 07/19/2014 0615   Lab Results  Component Value Date   HGBA1C 6.5* 12/16/2010      Component Value Date/Time   LABOPIA NONE DETECTED 07/18/2014 2120   COCAINSCRNUR NONE DETECTED 07/18/2014 2120   LABBENZ NONE DETECTED 07/18/2014 2120   AMPHETMU NONE DETECTED 07/18/2014 2120   THCU NONE DETECTED 07/18/2014 2120   LABBARB NONE DETECTED 07/18/2014 2120     Recent Labs Lab 07/18/14 2100  ETH <5    Imaging   Dg Chest 2 View 07/19/2014     Mild hyperinflation.     Ct Head Wo Contrast 07/18/2014    No acute findings. Mild generalized atrophy and chronic microvascular changes. Probable old infarction in the left  corona radiata and posterior left lentiform nuclei.     Mr Jodene Nam Head/brain Wo Cm 07/19/2014     MRI HEAD  1. Acute ischemic linear nonhemorrhagic right frontal lobe infarct. 2. No other acute intracranial process identified.  3. Generalized cerebral atrophy with chronic microvascular ischemic disease and small remote infarcts as detailed above.  4. Cerebellar tonsillar ectopia of 3 mm.    MRA HEAD    1. No proximal or large vessel occlusion identified within the intracranial circulation. No focal high-grade or correctable stenosis identified within the intracranial circulation.  2. Moderate narrowing of the distal cavernous/supra clinoid segments of the internal carotid arteries bilaterally, slightly worse on the left.  3. Widely patent posterior circulation.        PHYSICAL EXAM NEUROLOGIC:   MENTAL STATUS: awake, alert, oriented, language  fluent with mild dysarthria, follows simple commands.  CRANIAL NERVES: pupils equal and reactive to light,extraocular motions, visual fields grossly intact intact, facial sensation intact, and left facial droop, uvula midlinec, tongue midline MOTOR: normal bulk and tone, Strength - 5/5 throughout. Diminished fine finger movements on the left. Orbits right over left upper extremity. SENSORY: normal and symmetric to light touch  COORDINATION: finger-nose-finger normal - heel to shin normal  REFLEXES: deep tendon reflexes present and symmetric GAIT: Deferred at this time     ASSESSMENT/PLAN Ms. Beth Padilla is a 74 y.o. female with history of diabetes mellitus, hypertension, hyperlipidemia, and renal dysfunction  presenting with dysarthria and facial droop. She did not receive IV t-PA due to late presentation.  Stroke:  Non-dominant right frontal lobe infarct. Etiology likely embolic with source to be determined  Resultant improving deficits  MRI  acute ischemic linear nonhemorrhagic right frontal lobe infarct  MRA - no proximal or large vessel occlusion identified within the intracranial circulation  Carotid Doppler 1-39% bilateral ICA stenosis  2D Echo EF 65-70%.  A patent foramen ovale cannot be excluded.  Bilateral lower extremity duplex - no DVT  LDL 106  HgbA1c pending  Lovenox for VTE prophylaxis Diet heart healthy/carb modified Room service appropriate?: Yes; Fluid consistency:: Thin  aspirin 81 mg orally every day prior to admission, now on aspirin 325 mg orally every day  Patient counseled to be compliant with her antithrombotic medications  Ongoing aggressive stroke risk factor management  Therapy recommendations: Pending  Disposition: Pending  Hypertension  Home meds: Apresoline and Procardia  Stable   Hyperlipidemia  Home meds: Lipitor 40 mg daily resumed in hospital  LDL 106, goal < 70  Increase Lipitor to 80 mg daily  Continue statin at  discharge  Diabetes  HgbA1c pending, goal < 7.0  Uncontrolled  Other Stroke Risk Factors  Advanced age  Hx stroke/TIA (by MRI)  Family hx stroke (mother)    Other Active Problems  Renal insufficiency  Anemia   PLAN  Await therapy evaluations Check TEE and loop recorder.  D/W patient and family Hospital day # 2  Antony Contras, MD  07/20/2014, 2:18 PM          To contact Stroke Continuity provider, please refer to [email or web address] After hours, contact General Neurology

## 2014-07-20 NOTE — Progress Notes (Signed)
Physical Therapy Treatment Patient Details Name: ROSABELL CAMM MRN: [id number] DOB: 1940-08-19 Today's Date: 07/20/2014    History of Present Illness 74 year old female who  has a past medical history of Diabetes mellitus; Hypertension; Hypothyroidism (12/15/2010); Hyperlipidemia (12/15/2010); Anemia; Depression; and Renal disorder. MRI revealed acute frontal lobe infarct    PT Comments    Pt with improved ambulation endurance and gait pattern this date. Was able to complete stair negotiation with min A as well. Acute PT to con't ot follow to progress indep with ambulation.  Follow Up Recommendations  Home health PT;Supervision/Assistance - 24 hour     Equipment Recommendations  Rolling walker with 5" wheels    Recommendations for Other Services       Precautions / Restrictions Precautions Precautions: Fall Restrictions Weight Bearing Restrictions: No    Mobility  Bed Mobility Overal bed mobility: Needs Assistance Bed Mobility: Supine to Sit     Supine to sit: Supervision;HOB elevated     General bed mobility comments: HOB partially elevated, extra time and effort. No physical assist.  Transfers Overall transfer level: Needs assistance Equipment used: Rolling walker (2 wheeled) Transfers: Sit to/from Stand           General transfer comment: vc's for safe hand placement  Ambulation/Gait Ambulation/Gait assistance: Min guard Ambulation Distance (Feet): 200 Feet Assistive device: Rolling walker (2 wheeled);None Gait Pattern/deviations: Step-through pattern;Decreased stride length Gait velocity: slow   General Gait Details: pt with increased fluidity of gait pattern and better control of RW hwoever con't to drift to L..   Stairs Stairs: Yes Stairs assistance: Min assist Stair Management: One rail Left;Step to pattern Number of Stairs: 3 General stair comments: pt with good demo, used R hand held assist and L rail  Wheelchair Mobility    Modified  Rankin (Stroke Patients Only) Modified Rankin (Stroke Patients Only) Pre-Morbid Rankin Score: No significant disability Modified Rankin: Moderate disability     Balance           Standing balance support: Bilateral upper extremity supported Standing balance-Leahy Scale: Fair Standing balance comment: RW for safe amb                    Cognition Arousal/Alertness:  (pt initially lethargic but then woke up and was alert) Behavior During Therapy: WFL for tasks assessed/performed Overall Cognitive Status: Within Functional Limits for tasks assessed                      Exercises      General Comments        Pertinent Vitals/Pain Pain Assessment: No/denies pain    Home Living                      Prior Function            PT Goals (current goals can now be found in the care plan section) Acute Rehab PT Goals Patient Stated Goal: home Progress towards PT goals: Progressing toward goals    Frequency  Min 4X/week    PT Plan Current plan remains appropriate    Co-evaluation             End of Session Equipment Utilized During Treatment: Gait belt Activity Tolerance: Patient tolerated treatment well Patient left: in chair;with call bell/phone within reach;with family/visitor present     Time: 1430-1454 PT Time Calculation (min) (ACUTE ONLY): 24 min  Charges:  $Gait Training: 8-22 mins  G CodesKingsley Callander 07/20/2014, 4:08 PM   Kittie Plater, PT, DPT Pager #: [phone] Office #: [phone]

## 2014-07-20 NOTE — Progress Notes (Signed)
[facility] PROGRESS NOTE  Beth Padilla [id number] DOB: February 20, 1940 DOA: 07/18/2014  PCP: Kendrick Ranch, MD  Brief HPI: 74 year old African-American female with a past medical history of diabetes, hypertension, hypothyroidism, hyperlipidemia, presented with slurred speech. There was no extremity weakness. Patient was hospitalized for further management.  Past medical history:  Past Medical History  Diagnosis Date  . Diabetes mellitus   . Hypertension   . Hypothyroidism 12/15/2010  . Hyperlipidemia 12/15/2010  . Anemia   . Depression   . Renal disorder     Consultants: Neurology  Procedures:  2-D echocardiogram Study Conclusions - Left ventricle: The cavity size was normal. Wall thickness wasincreased in a pattern of mild LVH. Systolic function wasvigorous. The estimated ejection fraction was in the range of 65%to 70%. Wall motion was normal; there were no regional wallmotion abnormalities. Doppler parameters are consistent withrestrictive physiology, indicative of decreased left ventriculardiastolic compliance and/or increased left atrial pressure. - Aortic valve: Mildly calcified annulus. Trileaflet; moderatelycalcified leaflets. There was mild stenosis by planimetry,moderate by velocity. There was mild regurgitation. Mean gradient(S): 19 mm Hg. Peak gradient (S): 39 mm Hg. VTI ratio of LVOT toaortic valve: 0.46. - Mitral valve: There was mild regurgitation. - Left atrium: The atrium was severely dilated. - Right atrium: Central venous pressure (est): 8 mm Hg. - Atrial septum: A patent foramen ovale cannot be excluded. - Tricuspid valve: There was trivial regurgitation. - Pulmonary arteries: Systolic pressure was moderately increased.PA peak pressure: 52 mm Hg (S). - Pericardium, extracardiac: There was no pericardial effusion. Impressions: - Mild LVH with LVEF 65-70%. Restrictive diastolic filling pattern.Severe left atrrial enlargement.  Mild mitral regurgitation. Mildto moderate aortic stenosis as outlined above. Mild aorticregurgitation. Moderate pulmonary hypertension with PASP 52 mmHg.Cannot exclude PFO.  Carotid Dopplers 1-39% ICA stenosis. Vertebral artery flow not insonated.  Antibiotics: None  Subjective: Patient denies any complaints. Her speech is almost back to normal. Denies any weakness or evidence of the body.   Objective: Vital Signs  Filed Vitals:   07/20/14 0153 07/20/14 0626 07/20/14 0631 07/20/14 0703  BP: 185/46 207/43 206/49 202/50  Pulse: 51  54   Temp: 98.3 F (36.8 C)  98.6 F (37 C)   TempSrc: Oral  Oral   Resp: 16  18   Height:      Weight:      SpO2: 98%  99%     Intake/Output Summary (Last 24 hours) at 07/20/14 0902 Last data filed at 07/20/14 0847  Gross per 24 hour  Intake   1360 ml  Output    250 ml  Net   1110 ml   Filed Weights   07/18/14 2048  Weight: 77.565 kg (171 lb)    General appearance: alert, cooperative, appears stated age and no distress Resp: clear to auscultation bilaterally Cardio: S1, S2, is bradycardic, regular. No S3, S4. No rubs, or bruit. Systolic murmur appreciated over the precordium. GI: soft, non-tender; bowel sounds normal; no masses,  no organomegaly Extremities: extremities normal, atraumatic, no cyanosis or edema Neurologic: Alert and oriented 3. Left-sided facial droop noted. Tongue is midline. Strength is equal and normal bilateral upper and lower extremities in all muscle groups.  Lab Results:  Basic Metabolic Panel:  Recent Labs Lab 07/18/14 2100 07/19/14 0021 07/20/14 0444  NA 139  --  140  K 3.7  --  3.5  CL 111  --  112*  CO2 19*  --  18*  GLUCOSE 227*  --  146*  BUN 56*  --  39*  CREATININE 1.89* 1.81* 1.67*  CALCIUM 8.5*  --  8.3*   Liver Function Tests:  Recent Labs Lab 07/18/14 2100  AST 16  ALT 13*  ALKPHOS 66  BILITOT 0.6  PROT 7.6  ALBUMIN 4.2   CBC:  Recent Labs Lab 07/18/14 2100  07/19/14 0021 07/20/14 0444  WBC 8.5 7.4 6.3  NEUTROABS 4.3  --   --   HGB 10.9* 10.3* 8.7*  HCT 35.2* 33.1* 28.1*  MCV 98.1 96.5 95.9  PLT 305 274 227   Cardiac Enzymes:  Recent Labs Lab 07/18/14 2100 07/19/14 0021 07/19/14 0615 07/19/14 1141  TROPONINI 0.12* 0.11* 0.12* 0.14*   CBG:  Recent Labs Lab 07/18/14 2043 07/19/14 1150 07/19/14 1610 07/19/14 2144 07/20/14 0635  GLUCAP 223* 154* 230* 181* 134*      Studies/Results: Dg Chest 2 View  07/19/2014   CLINICAL DATA:  Cerebral infarction  EXAM: CHEST  2 VIEW  COMPARISON:  09/14/2013  FINDINGS: There is mild hyperinflation. There is mild interstitial coarsening, likely chronic. There is no confluent airspace opacity. There is no effusion. Heart size is normal.  IMPRESSION: Mild hyperinflation.   Electronically Signed   By: Andreas Newport M.D.   On: 07/19/2014 03:34   Ct Head Wo Contrast  07/18/2014   CLINICAL DATA:  Right-sided facial droop and slurred speech.  EXAM: CT HEAD WITHOUT CONTRAST  TECHNIQUE: Contiguous axial images were obtained from the base of the skull through the vertex without intravenous contrast.  COMPARISON:  08/11/2004  FINDINGS: There is no intracranial hemorrhage, mass or evidence of acute infarction. There is hypodensity in the left corona radiata and posterior left lentiform nuclei which may represent old infarction in there also was hypodensity in the posterior left lentiform nuclei on the 2006 examination. There is mild generalized atrophy. There is mild microvascular ischemic change. Posterior fossa and brainstem appear unremarkable.  There is no bony abnormality. The visible paranasal sinuses are clear.  IMPRESSION: No acute findings. Mild generalized atrophy and chronic microvascular changes. 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 These results were called by telephone at the time of interpretation on 07/18/2014 at 9:19 pm to Dr. Francine Graven ,  who verbally acknowledged these results.   Electronically Signed   By: Andreas Newport M.D.   On: 07/18/2014 21:21   Mr Brain Wo Contrast  07/19/2014   CLINICAL DATA:  Initial evaluation for acute slurred speech.  EXAM: MRI HEAD WITHOUT CONTRAST  MRA HEAD WITHOUT CONTRAST  TECHNIQUE: Multiplanar, multiecho pulse sequences of the brain and surrounding structures were obtained without intravenous contrast. Angiographic images of the head were obtained using MRA technique without contrast.  COMPARISON:  Prior CT from 07/18/2014  FINDINGS: MRI HEAD FINDINGS  Diffuse prominence of the CSF containing spaces is compatible with generalized cerebral atrophy. Patchy T2/FLAIR hyperintensity within the periventricular and deep white matter both cerebral hemispheres present, most consistent with chronic small vessel ischemic disease.  Remote lacunar infarct involving the left lentiform nuclei S extending superiorly through the periventricular white matter left corona radiata present. There is an additional small remote cortical infarct within the left parietal lobe. Probable additional tiny remote lacunar infarct within the right lentiform nucleus with associated chronic blood products.  There is a linear focus of restricted diffusion involving the cortical gray matter and underlying deep white matter of the right frontal lobe, compatible with acute nonhemorrhagic ischemic infarct (series 4, image 33). No associated mass effect or hemorrhage.  No other cerebral infarction. Normal intravascular flow voids are maintained.  No mass lesion, midline shift, or mass effect. No hydrocephalus. No extra-axial fluid collection.  Cerebellar tonsils are somewhat low lying and PEG at the foramen magnum. There positioned approximately 3 mm below the foramen magnum without frank Chiari malformation. Craniocervical junction otherwise unremarkable. Degenerative spondylolysis noted within the visualized upper cervical spine. 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  No acute abnormality about the orbits.  Scattered mucosal thickening present within the ethmoidal air cells, sphenoid sinuses, and maxillary sinuses. No air-fluid levels to suggest active sinus infection. Minimal scattered fluid opacity present within the mastoid air cells bilaterally. Inner ear structures within normal limits.  Bone marrow signal intensity within normal limits. Scalp soft tissues unremarkable.  MRA HEAD FINDINGS  ANTERIOR CIRCULATION:  Visualized distal cervical segments of the internal carotid arteries are widely patent with antegrade flow. The internal carotid arteries appear medialized into the retropharyngeal space. Petrous segments are widely patent. Moderate narrowing of the distal cavernous/supra clinoid segments of the internal carotid arteries is present bilaterally, slightly worse on the left. No focal high-grade stenosis. A1 segments, anterior communicating artery and anterior cerebral arteries well opacified. M1 segments widely patent bilaterally without stenosis or occlusion. MCA bifurcations within normal limits. Distal MCA branches well opacified bilaterally.  POSTERIOR CIRCULATION:  Vertebral arteries are widely patent to the vertebrobasilar junction. Posterior inferior cerebellar arteries are patent bilaterally. Basilar artery widely patent. Anterior inferior cerebral arteries and superior cerebellar arteries are well opacified bilaterally. Posterior cerebral arteries demonstrate mild multi focal atheromatous irregularity without significant stenosis.  No aneurysm or vascular malformation.  IMPRESSION: MRI HEAD IMPRESSION:  1. Acute ischemic linear nonhemorrhagic right frontal lobe infarct as above. 2. No other acute intracranial process identified. 3. Generalized cerebral atrophy with chronic microvascular ischemic disease and small remote infarcts as detailed above. 4. Cerebellar tonsillar ectopia of 3 mm.  MRA HEAD IMPRESSION:  1. No proximal or large  vessel occlusion identified within the intracranial circulation. No focal high-grade or correctable stenosis identified within the intracranial circulation. 2. Moderate narrowing of the distal cavernous/supra clinoid segments of the internal carotid arteries bilaterally, slightly worse on the left. 3. Widely patent posterior circulation.   Electronically Signed   By: Jeannine Boga M.D.   On: 07/19/2014 05:14   Mr Jodene Nam Head/brain Wo Cm  07/19/2014   CLINICAL DATA:  Initial evaluation for acute slurred speech.  EXAM: MRI HEAD WITHOUT CONTRAST  MRA HEAD WITHOUT CONTRAST  TECHNIQUE: Multiplanar, multiecho pulse sequences of the brain and surrounding structures were obtained without intravenous contrast. Angiographic images of the head were obtained using MRA technique without contrast.  COMPARISON:  Prior CT from 07/18/2014  FINDINGS: MRI HEAD FINDINGS  Diffuse prominence of the CSF containing spaces is compatible with generalized cerebral atrophy. Patchy T2/FLAIR hyperintensity within the periventricular and deep white matter both cerebral hemispheres present, most consistent with chronic small vessel ischemic disease.  Remote lacunar infarct involving the left lentiform nuclei S extending superiorly through the periventricular white matter left corona radiata present. There is an additional small remote cortical infarct within the left parietal lobe. Probable additional tiny remote lacunar infarct within the right lentiform nucleus with associated chronic blood products.  There is a linear focus of restricted diffusion involving the cortical gray matter and underlying deep white matter of the right frontal lobe, compatible with acute nonhemorrhagic ischemic infarct (series 4, image 33). No associated mass effect or hemorrhage. No other cerebral infarction. Normal intravascular  flow voids are maintained.  No mass lesion, midline shift, or mass effect. No hydrocephalus. No extra-axial fluid collection.   Cerebellar tonsils are somewhat low lying and PEG at the foramen magnum. There positioned approximately 3 mm below the foramen magnum without frank Chiari malformation. Craniocervical junction otherwise unremarkable. Degenerative spondylolysis noted within the visualized upper cervical spine. 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  No acute abnormality about the orbits.  Scattered mucosal thickening present within the ethmoidal air cells, sphenoid sinuses, and maxillary sinuses. No air-fluid levels to suggest active sinus infection. Minimal scattered fluid opacity present within the mastoid air cells bilaterally. Inner ear structures within normal limits.  Bone marrow signal intensity within normal limits. Scalp soft tissues unremarkable.  MRA HEAD FINDINGS  ANTERIOR CIRCULATION:  Visualized distal cervical segments of the internal carotid arteries are widely patent with antegrade flow. The internal carotid arteries appear medialized into the retropharyngeal space. Petrous segments are widely patent. Moderate narrowing of the distal cavernous/supra clinoid segments of the internal carotid arteries is present bilaterally, slightly worse on the left. No focal high-grade stenosis. A1 segments, anterior communicating artery and anterior cerebral arteries well opacified. M1 segments widely patent bilaterally without stenosis or occlusion. MCA bifurcations within normal limits. Distal MCA branches well opacified bilaterally.  POSTERIOR CIRCULATION:  Vertebral arteries are widely patent to the vertebrobasilar junction. Posterior inferior cerebellar arteries are patent bilaterally. Basilar artery widely patent. Anterior inferior cerebral arteries and superior cerebellar arteries are well opacified bilaterally. Posterior cerebral arteries demonstrate mild multi focal atheromatous irregularity without significant stenosis.  No aneurysm or vascular malformation.  IMPRESSION: MRI HEAD IMPRESSION:  1. Acute ischemic linear  nonhemorrhagic right frontal lobe infarct as above. 2. No other acute intracranial process identified. 3. Generalized cerebral atrophy with chronic microvascular ischemic disease and small remote infarcts as detailed above. 4. Cerebellar tonsillar ectopia of 3 mm.  MRA HEAD IMPRESSION:  1. No proximal or large vessel occlusion identified within the intracranial circulation. No focal high-grade or correctable stenosis identified within the intracranial circulation. 2. Moderate narrowing of the distal cavernous/supra clinoid segments of the internal carotid arteries bilaterally, slightly worse on the left. 3. Widely patent posterior circulation.   Electronically Signed   By: Jeannine Boga M.D.   On: 07/19/2014 05:14    Medications:  Scheduled: . aspirin  300 mg Rectal Daily   Or  . aspirin  325 mg Oral Daily  . atorvastatin  80 mg Oral QPM  . enoxaparin (LOVENOX) injection  30 mg Subcutaneous QHS  . escitalopram  10 mg Oral Daily  . insulin aspart  0-15 Units Subcutaneous TID WC  . insulin aspart  0-5 Units Subcutaneous QHS  . ketotifen  1 drop Both Eyes Daily  . lactose free nutrition  237 mL Oral TID WC  . levothyroxine  100 mcg Oral QAC breakfast  . NIFEdipine  30 mg Oral BID  . pantoprazole  40 mg Oral Daily  . sodium bicarbonate  1,300 mg Oral BID   Continuous: . sodium chloride 50 mL/hr at 07/19/14 1212   PRN:  Assessment/Plan:  Principal Problem:   Stroke Active Problems:   HTN (hypertension)   Hypothyroidism   Hyperlipidemia   Bradycardia   CKD (chronic kidney disease), stage III    Acute stroke involving the right hemisphere Speech is improved. Neurology is following. Continue aspirin for now. Continue statin. Carotid Doppler and echocardiogram as above. PT and OT to recommend home health. HbA1c is pending. LDL is 106. Continue statin.  Discussed with neurology. Plan is for TEE and loop recorder.  Minimally elevated troponin Likely related to the above. Patient  denies any chest pain. EKG shows nonspecific T-wave changes. Echocardiogram reviewed. No wall motion abnormalities. Continue aspirin. She is followed by a cardiologist in [place]. She may continue to do so.  Bradycardia Appears to be sinus. She is not on any AV nodal blocking agents. She does have a history of hypothyroidism. TSH and free T4 are normal. Heart rate remains low. She remains asymptomatic with elevated blood pressures. Her records were reviewed. She's had low heart rates in the past as well. She used to be on a beta blocker which has been discontinued as of last year. This can be pursued further by her cardiologist as an outpatient.  Chronic kidney disease stage III Stable. Creatinine better than baseline. Continue bicarbonate.  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 Initiate sliding scale coverage. HbA1c is pending. No diabetic meds listed on home med list. Will clarify with family.  Hypothyroidism Continue Synthroid. TSH and free T4 normal.  Essential hypertension Blood pressure significantly elevated this morning. She is more than 48 hours post stroke. We can slowly start controlling BP. Will reinitiate her nifedipine and, if necessary, even hydralazine, which might actually help with heart rate.  DVT Prophylaxis: Lovenox    Code Status: Full code  Family Communication: Discussed with the patient and her daughter who was at bedside  Disposition Plan: Await stroke workup to be completed. PT and OT recommends home health. TEE and loop recorder pending.    LOS: 2 days   [facility] Pager [phone] 07/20/2014, 9:02 AM  If 7PM-7AM, please contact night-coverage at [email or web address], password [facility]

## 2014-07-20 NOTE — Progress Notes (Signed)
Occupational Therapy Evaluation Patient Details Name: Beth Padilla MRN: [id number] DOB: [birth year] Today's Date: 07/20/2014    History of Present Illness 74 year old female who  has a past medical history of Diabetes mellitus; Hypertension; Hypothyroidism (12/15/2010); Hyperlipidemia (12/15/2010); Anemia; Depression; and Renal disorder. MRI revealed acute frontal lobe infarct   Clinical Impression   Pt admitted with the above diagnoses and presents with below problem list. Pt will benefit from continued acute OT to address the below listed deficits and maximize independence with BADLs prior to d/c home with family. PTA pt was independent with ADLs. Pt is currently at min guard level for LB ADLs, trnasfers, and functional mobility. Decreased dynamic balance and activity tolerance impacting level of assist with ADLs. OT to continue to follow acutely and recommend HHOT at d/c.     Follow Up Recommendations  Home health OT;Supervision/Assistance - 24 hour    Equipment Recommendations  Tub/shower seat    Recommendations for Other Services       Precautions / Restrictions Precautions Precautions: Fall Restrictions Weight Bearing Restrictions: No      Mobility Bed Mobility Overal bed mobility: Needs Assistance Bed Mobility: Supine to Sit     Supine to sit: Supervision;HOB elevated     General bed mobility comments: HOB partially elevated, extra time and effort. No physical assist.  Transfers Overall transfer level: Needs assistance Equipment used: Rolling walker (2 wheeled) Transfers: Sit to/from Stand Sit to Stand: Min guard         General transfer comment: bed height elevated to match height at home.     Balance Overall balance assessment: Needs assistance Sitting-balance support: No upper extremity supported;Feet supported Sitting balance-Leahy Scale: Fair     Standing balance support: Bilateral upper extremity supported;During functional  activity Standing balance-Leahy Scale: Fair Standing balance comment: rw for balance. used sink for external support standing during grooming                            ADL Overall ADL's : Needs assistance/impaired Eating/Feeding: Set up;Sitting   Grooming: Min guard;Wash/dry hands;Oral care;Brushing hair;Standing Grooming Details (indicate cue type and reason): stood at sink with external support to complete grooming including opening packages and twist caps Upper Body Bathing: Set up;Sitting   Lower Body Bathing: Min guard;Sit to/from stand   Upper Body Dressing : Set up;Sitting   Lower Body Dressing: Min guard;Sit to/from stand   Toilet Transfer: Min guard;Ambulation;Comfort height toilet;Grab bars;RW   Toileting- Water quality scientist and Hygiene: Min guard;Sit to/from stand   Tub/ Shower Transfer: Min guard;Ambulation;Shower seat;Rolling walker;Tub transfer Tub/Shower Transfer Details (indicate cue type and reason): practiced stepping over tub with simulated setup in room. Educated pt and family on having someone beside her during tub transfer Functional mobility during ADLs: Min guard;Rolling walker General ADL Comments: Pt and family report pt appears close to baseline. Pt with some decreased activity tolerance and dynamic standing balance impacting level of assist with ADLs. Pt completed bed mobility as detailed below, ambulated to bathroom at min guard using rw, and completed grooming tasks in standing as detailed above. Discussed home setup safety and fall prevention tips.      Vision Additional Comments: Some decreased smoothness tracking vertically in left lateral visual field.    Perception     Praxis      Pertinent Vitals/Pain Pain Assessment: No/denies pain     Hand Dominance  (ambidextrous but writes with RIght)   Extremity/Trunk Assessment Upper Extremity  Assessment Upper Extremity Assessment: Overall WFL for tasks assessed (baseline B fine  motor deficits due to arthritis)   Lower Extremity Assessment Lower Extremity Assessment: Defer to PT evaluation       Communication Communication Communication: No difficulties   Cognition Arousal/Alertness: Awake/alert Behavior During Therapy: WFL for tasks assessed/performed Overall Cognitive Status: Within Functional Limits for tasks assessed                     General Comments       Exercises       Shoulder Instructions      Home Living Family/patient expects to be discharged to:: Private residence Living Arrangements: Children Available Help at Discharge: Family;Available 24 hours/day Type of Home: House Home Access: Stairs to enter [employer] of Steps: 8 Entrance Stairs-Rails: Can reach both Home Layout: One level     Bathroom Shower/Tub: Teacher, early years/pre: Handicapped height Bathroom Accessibility: Yes How Accessible: Accessible via walker Home Equipment: [place] - single point          Prior Functioning/Environment Level of Independence: Independent        Comments: lives with son    OT Diagnosis: Other (comment) (impaired dynamic balance, activity tolerance)   OT Problem List: Decreased activity tolerance;Impaired balance (sitting and/or standing);Decreased knowledge of use of DME or AE;Decreased knowledge of precautions   OT Treatment/Interventions: Self-care/ADL training;Therapeutic exercise;Neuromuscular education;Energy conservation;DME and/or AE instruction;Therapeutic activities;Patient/family education;Balance training    OT Goals(Current goals can be found in the care plan section) Acute Rehab OT Goals Patient Stated Goal: home OT Goal Formulation: With patient/family Time For Goal Achievement: 07/27/14 Potential to Achieve Goals: Good ADL Goals Pt Will Perform Grooming: with modified independence;standing Pt Will Transfer to Toilet: with modified independence;ambulating;grab bars (comfort height  toilet) Pt Will Perform Toileting - Clothing Manipulation and hygiene: with modified independence;sit to/from stand Pt Will Perform Tub/Shower Transfer: Tub transfer;with supervision;ambulating;shower seat;rolling walker  OT Frequency: Min 2X/week   Barriers to D/C:            Co-evaluation              End of Session Equipment Utilized During Treatment: Gait belt;Rolling walker  Activity Tolerance: Patient tolerated treatment well Patient left: in chair;with call bell/phone within reach;with family/visitor present   Time: [id number] OT Time Calculation (min): 32 min Charges:  OT General Charges $OT Visit: 1 Procedure OT Evaluation $Initial OT Evaluation Tier I: 1 Procedure OT Treatments $Self Care/Home Management : 8-22 mins G-Codes:    [name] 2014-08-14, 9:40 AM

## 2014-07-21 ENCOUNTER — Inpatient Hospital Stay (HOSPITAL_COMMUNITY): Payer: Medicare Other

## 2014-07-21 ENCOUNTER — Encounter (HOSPITAL_COMMUNITY)
Admission: EM | Disposition: A | Discharge status: Home Health | Payer: Medicare Other | Source: Home / Self Care | Attending: Internal Medicine

## 2014-07-21 ENCOUNTER — Encounter (HOSPITAL_COMMUNITY): Payer: Self-pay | Admitting: *Deleted

## 2014-07-21 DIAGNOSIS — E785 Hyperlipidemia, unspecified: Secondary | ICD-10-CM

## 2014-07-21 DIAGNOSIS — I639 Cerebral infarction, unspecified: Principal | ICD-10-CM | POA: Insufficient documentation

## 2014-07-21 DIAGNOSIS — I63411 Cerebral infarction due to embolism of right middle cerebral artery: Secondary | ICD-10-CM

## 2014-07-21 DIAGNOSIS — R001 Bradycardia, unspecified: Secondary | ICD-10-CM

## 2014-07-21 DIAGNOSIS — I351 Nonrheumatic aortic (valve) insufficiency: Secondary | ICD-10-CM

## 2014-07-21 HISTORY — PX: EP IMPLANTABLE DEVICE: SHX172B

## 2014-07-21 HISTORY — PX: TEE WITHOUT CARDIOVERSION: SHX5443

## 2014-07-21 LAB — HEMOGLOBIN A1C
HEMOGLOBIN A1C: 7 % — AB (ref 4.8–5.6)
MEAN PLASMA GLUCOSE: 154 mg/dL

## 2014-07-21 LAB — GLUCOSE, CAPILLARY
GLUCOSE-CAPILLARY: 145 mg/dL — AB (ref 65–99)
GLUCOSE-CAPILLARY: 150 mg/dL — AB (ref 65–99)
Glucose-Capillary: 176 mg/dL — ABNORMAL HIGH (ref 65–99)

## 2014-07-21 SURGERY — ECHOCARDIOGRAM, TRANSESOPHAGEAL
Anesthesia: Moderate Sedation

## 2014-07-21 SURGERY — LOOP RECORDER INSERTION
Anesthesia: LOCAL

## 2014-07-21 MED ORDER — SODIUM CHLORIDE 0.9 % IV SOLN
INTRAVENOUS | Status: DC
  Administered 2014-07-21: 1000 mL via INTRAVENOUS

## 2014-07-21 MED ORDER — HYDRALAZINE HCL 20 MG/ML IJ SOLN
INTRAMUSCULAR | Status: DC | PRN
  Administered 2014-07-21: 10 mg via INTRAVENOUS

## 2014-07-21 MED ORDER — FENTANYL CITRATE (PF) 100 MCG/2ML IJ SOLN
INTRAMUSCULAR | Status: DC | PRN
  Administered 2014-07-21: 25 ug via INTRAVENOUS

## 2014-07-21 MED ORDER — FUROSEMIDE 40 MG PO TABS
40.0000 mg | ORAL_TABLET | Freq: Two times a day (BID) | ORAL | Status: DC
  Administered 2014-07-21 – 2014-07-22 (×2): 40 mg via ORAL
  Filled 2014-07-21 (×2): qty 1

## 2014-07-21 MED ORDER — MIDAZOLAM HCL 10 MG/2ML IJ SOLN
INTRAMUSCULAR | Status: DC | PRN
  Administered 2014-07-21: 2 mg via INTRAVENOUS

## 2014-07-21 MED ORDER — LIDOCAINE-EPINEPHRINE 1 %-1:100000 IJ SOLN
INTRAMUSCULAR | Status: AC
  Filled 2014-07-21: qty 1

## 2014-07-21 MED ORDER — FENTANYL CITRATE (PF) 100 MCG/2ML IJ SOLN
INTRAMUSCULAR | Status: AC
  Filled 2014-07-21: qty 2

## 2014-07-21 MED ORDER — LINAGLIPTIN 5 MG PO TABS
5.0000 mg | ORAL_TABLET | Freq: Every day | ORAL | Status: DC
  Administered 2014-07-22: 5 mg via ORAL
  Filled 2014-07-21: qty 1

## 2014-07-21 MED ORDER — MIDAZOLAM HCL 5 MG/ML IJ SOLN
INTRAMUSCULAR | Status: AC
  Filled 2014-07-21: qty 2

## 2014-07-21 MED ORDER — HYDRALAZINE HCL 20 MG/ML IJ SOLN
INTRAMUSCULAR | Status: AC
  Filled 2014-07-21: qty 1

## 2014-07-21 MED ORDER — BUTAMBEN-TETRACAINE-BENZOCAINE 2-2-14 % EX AERO
INHALATION_SPRAY | CUTANEOUS | Status: DC | PRN
  Administered 2014-07-21: 2 via TOPICAL

## 2014-07-21 SURGICAL SUPPLY — 2 items
LOOP REVEAL LINQSYS (Prosthesis & Implant Heart) ×2 IMPLANT
PACK LOOP INSERTION (CUSTOM PROCEDURE TRAY) ×2 IMPLANT

## 2014-07-21 NOTE — Op Note (Signed)
SURGEON:  [name], MD     PREPROCEDURE DIAGNOSIS:  Cryptogenic Stroke    POSTPROCEDURE DIAGNOSIS:  Cryptogenic Stroke     PROCEDURES:   1. Implantable loop recorder implantation    INTRODUCTION:  Beth Padilla is a 74 y.o. female with a history of unexplained stroke who presents today for implantable loop implantation.  The patient has had a cryptogenic stroke.  Despite an extensive workup by neurology, no reversible causes have been identified.  she has worn telemetry during which she did not have arrhythmias.  There is significant concern for possible atrial fibrillation as the cause for the patients stroke.  The patient therefore presents today for implantable loop implantation.     DESCRIPTION OF PROCEDURE:  Informed written consent was obtained, and the patient was brought to the electrophysiology lab in a fasting state.  The patient required no sedation for the procedure today.  Mapping over the patient's chest was performed by the EP lab staff to identify the area where electrograms were most prominent for ILR recording.  This area was found to be the left parasternal region over the 3rd-4th intercostal space. The patients left chest was therefore prepped and draped in the usual sterile fashion by the EP lab staff. The skin overlying the left parasternal region was infiltrated with lidocaine for local analgesia.  A 0.5-cm incision was made over the left parasternal region over the 3rd intercostal space.  A subcutaneous ILR pocket was fashioned using a combination of sharp and blunt dissection.  A Medtronic Reveal Car[place] model [id number] implantable loop recorder was then placed into the pocket  R waves were very prominent and measured 01.28mV. EBL<1 ml.  Steri- Strips and a sterile dressing were then applied.  There were no early apparent complications.     CONCLUSIONS:   1. Successful implantation of a Medtronic Reveal LINQ implantable loop recorder for cryptogenic stroke  2.  No early apparent complications.   [name] MD, [facility] 07/21/2014 4:44 PM

## 2014-07-21 NOTE — Progress Notes (Signed)
Patient is off the floor she has gone to endo for TEE and placement of loop recorder.

## 2014-07-21 NOTE — H&P (View-Only) (Signed)
ELECTROPHYSIOLOGY CONSULT NOTE  Patient ID: Beth Padilla MRN: [id number], DOB/AGE: 1940-01-23   Admit date: 07/18/2014 Date of Consult: 07/21/2014  Primary Physician: Kendrick Ranch, MD Primary Cardiologist: Dr Einar Gip Reason for Consultation: Cryptogenic stroke; recommendations regarding Implantable Loop Recorder  History of Present Illness Beth Padilla was admitted on 07/18/2014 with slurred speech and facial drooping.  Imaging demonstrated non dominant right frontal lobe infarct.  She has undergone workup for stroke including echocardiogram and carotid dopplers.  The patient has been monitored on telemetry which has demonstrated sinus rhythm with no arrhythmias.  Inpatient stroke work-up is to be completed with a TEE.   Echocardiogram this admission demonstrated EF 65-70%, mild LVH, no RWMA, moderate AS, LA 45.  Lab work is reviewed.  Prior to admission, the patient denies chest pain, shortness of breath, dizziness, palpitations, or syncope.  They are recovering from their stroke with plans to return home at discharge.  She states that a few years ago while hospitalized in [place], [place], she was told that she had atrial fibrillation but was not started on anticoagulation at that time.   EP has been asked to evaluate for placement of an implantable loop recorder to monitor for atrial fibrillation.  ROS is negative except as outlined above.    Past Medical History  Diagnosis Date  . Diabetes mellitus   . Hypertension   . Hypothyroidism 12/15/2010  . Hyperlipidemia 12/15/2010  . Anemia   . Depression   . Renal disorder      Surgical History:  Past Surgical History  Procedure Laterality Date  . Abdominal hysterectomy       Prescriptions prior to admission  Medication Sig Dispense Refill Last Dose  . acetaminophen (TYLENOL) 650 MG CR tablet Take 650 mg by mouth every 8 (eight) hours as needed for pain.   Past Month  . aspirin 81 MG chewable tablet Chew 81  mg by mouth daily.   07/18/2014  . escitalopram (LEXAPRO) 10 MG tablet Take 10 mg by mouth daily.   07/18/2014  . furosemide (LASIX) 40 MG tablet Take 40 mg by mouth 2 (two) times daily.   07/18/2014  . hydrALAZINE (APRESOLINE) 100 MG tablet Take 100 mg by mouth 3 (three) times daily.   07/18/2014  . ketotifen (ALAWAY) 0.025 % ophthalmic solution Place 1 drop into both eyes daily.   07/18/2014  . levothyroxine (SYNTHROID, LEVOTHROID) 100 MCG tablet Take 100 mcg by mouth daily before breakfast.   07/18/2014  . NIFEdipine (PROCARDIA-XL/ADALAT CC) 30 MG 24 hr tablet Take 30 mg by mouth 2 (two) times daily.   07/18/2014  . omeprazole (PRILOSEC) 20 MG capsule Take 20 mg by mouth daily.   07/18/2014  . PRESCRIPTION MEDICATION Take 1 capsule by mouth daily. Acetazolamide ER 500 MG   07/18/2014  . sodium bicarbonate 650 MG tablet Take 1,300 mg by mouth 2 (two) times daily.   07/18/2014  . traMADol (ULTRAM) 50 MG tablet Take 50 mg by mouth every 6 (six) hours as needed.   07/18/2014  . [DISCONTINUED] atorvastatin (LIPITOR) 40 MG tablet Take 40 mg by mouth every evening.   07/18/2014    Inpatient Medications:  . aspirin  300 mg Rectal Daily   Or  . aspirin  325 mg Oral Daily  . atorvastatin  80 mg Oral QPM  . enoxaparin (LOVENOX) injection  40 mg Subcutaneous QHS  . escitalopram  10 mg Oral Daily  . hydrALAZINE  100 mg Oral TID  . insulin  aspart  0-15 Units Subcutaneous TID WC  . insulin aspart  0-5 Units Subcutaneous QHS  . ketotifen  1 drop Both Eyes Daily  . lactose free nutrition  237 mL Oral TID WC  . levothyroxine  100 mcg Oral QAC breakfast  . NIFEdipine  30 mg Oral BID  . pantoprazole  40 mg Oral Daily  . sodium bicarbonate  1,300 mg Oral BID    Allergies:  Allergies  Allergen Reactions  . Ampicillin Rash    History   Social History  . Marital Status: Widowed    Spouse Name: N/A  . Number of Children: N/A  . Years of Education: N/A   Occupational History  . Not on file.   Social History Main  Topics  . Smoking status: Never Smoker   . Smokeless tobacco: Not on file  . Alcohol Use: No  . Drug Use: No  . Sexual Activity: Yes    Birth Control/ Protection: Surgical   Other Topics Concern  . Not on file   Social History Narrative     Family History  Problem Relation Age of Onset  . Stroke Mother   . Cancer Sister      Physical Exam: Filed Vitals:   07/20/14 1740 07/20/14 2200 07/21/14 0146 07/21/14 0535  BP: 155/40 182/45 144/47 176/51  Pulse: 53 51 71 56  Temp: 99.3 F (37.4 C) 98.8 F (37.1 C) 98.2 F (36.8 C) 98.5 F (36.9 C)  TempSrc: Oral Oral Oral Oral  Resp: 18 17 16 17   Height:      Weight:      SpO2: 100% 96% 100% 98%    GEN- The patient is well appearing, alert and oriented x 3 today.   Head- normocephalic, atraumatic Eyes-  Sclera clear, conjunctiva pink Ears- hearing intact Oropharynx- clear, poor dentition Neck- supple Lungs- Clear to ausculation bilaterally, normal work of breathing Heart- Regular rate and rhythm, 2/6 SEM GI- soft, NT, ND, + BS Extremities- no clubbing, cyanosis, or edema MS- no significant deformity or atrophy Skin- no rash or lesion Psych- euthymic mood, full affect   Labs:   Lab Results  Component Value Date   WBC 6.3 07/20/2014   HGB 8.7* 07/20/2014   HCT 28.1* 07/20/2014   MCV 95.9 07/20/2014   PLT 227 07/20/2014    Recent Labs Lab 07/18/14 2100  07/20/14 0444  NA 139  --  140  K 3.7  --  3.5  CL 111  --  112*  CO2 19*  --  18*  BUN 56*  --  39*  CREATININE 1.89*  < > 1.67*  CALCIUM 8.5*  --  8.3*  PROT 7.6  --   --   BILITOT 0.6  --   --   ALKPHOS 66  --   --   ALT 13*  --   --   AST 16  --   --   GLUCOSE 227*  --  146*  < > = values in this interval not displayed.   Radiology/Studies: Dg Chest 2 View 07/19/2014   CLINICAL DATA:  Cerebral infarction  EXAM: CHEST  2 VIEW  COMPARISON:  09/14/2013  FINDINGS: There is mild hyperinflation. There is mild interstitial coarsening, likely chronic. There  is no confluent airspace opacity. There is no effusion. Heart size is normal.  IMPRESSION: Mild hyperinflation.   Electronically Signed   By: Andreas Newport M.D.   On: 07/19/2014 03:34   Ct Head Wo Contrast 07/18/2014  CLINICAL DATA:  Right-sided facial droop and slurred speech.  EXAM: CT HEAD WITHOUT CONTRAST  TECHNIQUE: Contiguous axial images were obtained from the base of the skull through the vertex without intravenous contrast.  COMPARISON:  08/11/2004  FINDINGS: There is no intracranial hemorrhage, mass or evidence of acute infarction. There is hypodensity in the left corona radiata and posterior left lentiform nuclei which may represent old infarction in there also was hypodensity in the posterior left lentiform nuclei on the 2006 examination. There is mild generalized atrophy. There is mild microvascular ischemic change. Posterior fossa and brainstem appear unremarkable.  There is no bony abnormality. The visible paranasal sinuses are clear.  IMPRESSION: No acute findings. Mild generalized atrophy and chronic microvascular changes. 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 These results were called by telephone at the time of interpretation on 07/18/2014 at 9:19 pm to Dr. Francine Graven , who verbally acknowledged these results.   Electronically Signed   By: Andreas Newport M.D.   On: 07/18/2014 21:21   Mr Brain Wo Contrast 07/19/2014   CLINICAL DATA:  Initial evaluation for acute slurred speech.  EXAM: MRI HEAD WITHOUT CONTRAST  MRA HEAD WITHOUT CONTRAST  TECHNIQUE: Multiplanar, multiecho pulse sequences of the brain and surrounding structures were obtained without intravenous contrast. Angiographic images of the head were obtained using MRA technique without contrast.  COMPARISON:  Prior CT from 07/18/2014  FINDINGS: MRI HEAD FINDINGS  Diffuse prominence of the CSF containing spaces is compatible with generalized cerebral atrophy. Patchy T2/FLAIR  hyperintensity within the periventricular and deep white matter both cerebral hemispheres present, most consistent with chronic small vessel ischemic disease.  Remote lacunar infarct involving the left lentiform nuclei S extending superiorly through the periventricular white matter left corona radiata present. There is an additional small remote cortical infarct within the left parietal lobe. Probable additional tiny remote lacunar infarct within the right lentiform nucleus with associated chronic blood products.  There is a linear focus of restricted diffusion involving the cortical gray matter and underlying deep white matter of the right frontal lobe, compatible with acute nonhemorrhagic ischemic infarct (series 4, image 33). No associated mass effect or hemorrhage. No other cerebral infarction. Normal intravascular flow voids are maintained.  No mass lesion, midline shift, or mass effect. No hydrocephalus. No extra-axial fluid collection.  Cerebellar tonsils are somewhat low lying and PEG at the foramen magnum. There positioned approximately 3 mm below the foramen magnum without frank Chiari malformation. Craniocervical junction otherwise unremarkable. Degenerative spondylolysis noted within the visualized upper cervical spine. 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  No acute abnormality about the orbits.  Scattered mucosal thickening present within the ethmoidal air cells, sphenoid sinuses, and maxillary sinuses. No air-fluid levels to suggest active sinus infection. Minimal scattered fluid opacity present within the mastoid air cells bilaterally. Inner ear structures within normal limits.  Bone marrow signal intensity within normal limits. Scalp soft tissues unremarkable.  MRA HEAD FINDINGS  ANTERIOR CIRCULATION:  Visualized distal cervical segments of the internal carotid arteries are widely patent with antegrade flow. The internal carotid arteries appear medialized into the retropharyngeal space. Petrous  segments are widely patent. Moderate narrowing of the distal cavernous/supra clinoid segments of the internal carotid arteries is present bilaterally, slightly worse on the left. No focal high-grade stenosis. A1 segments, anterior communicating artery and anterior cerebral arteries well opacified. M1 segments widely patent bilaterally without stenosis or occlusion. MCA bifurcations within normal limits. Distal MCA branches well  opacified bilaterally.  POSTERIOR CIRCULATION:  Vertebral arteries are widely patent to the vertebrobasilar junction. Posterior inferior cerebellar arteries are patent bilaterally. Basilar artery widely patent. Anterior inferior cerebral arteries and superior cerebellar arteries are well opacified bilaterally. Posterior cerebral arteries demonstrate mild multi focal atheromatous irregularity without significant stenosis.  No aneurysm or vascular malformation.  IMPRESSION: MRI HEAD IMPRESSION:  1. Acute ischemic linear nonhemorrhagic right frontal lobe infarct as above. 2. No other acute intracranial process identified. 3. Generalized cerebral atrophy with chronic microvascular ischemic disease and small remote infarcts as detailed above. 4. Cerebellar tonsillar ectopia of 3 mm.  MRA HEAD IMPRESSION:  1. No proximal or large vessel occlusion identified within the intracranial circulation. No focal high-grade or correctable stenosis identified within the intracranial circulation. 2. Moderate narrowing of the distal cavernous/supra clinoid segments of the internal carotid arteries bilaterally, slightly worse on the left. 3. Widely patent posterior circulation.   Electronically Signed   By: Jeannine Boga M.D.   On: 07/19/2014 05:14   12-lead ECG sinus bradycardia, rate 46, non specific ST-T changes All prior EKG's in EPIC reviewed with no documented atrial fibrillation  Telemetry sinus brady, intermittent junctional rhythm, short run PAC's  Assessment and Plan:  1. Cryptogenic  stroke The patient presents with cryptogenic stroke.  The patient has a TEE planned for this AM.  I spoke at length with the patient about monitoring for afib with an implantable loop recorder.  Risks, benefits, and alteratives to implantable loop recorder were discussed with the patient today.   At this time, the patient is very clear in their decision to proceed with implantable loop recorder.   We will try to obtain records from [facility] this morning.  If the patient has documented atrial fibrillation, would anticoagulate and not place ILR  2.  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 She denies exercise intolerance, fatigue, shortness of breath, dizziness, or pre-syncope.  Would avoid BB, CCB but not further pursue in the absence of symptoms.   Please call with questions.   Chanetta Marshall, NP 07/21/2014 7:30 AM   I have seen, examined the patient, and reviewed the above assessment and plan.  Changes to above are made where necessary.  If TEE does not reveal stroke etiology, would plan for implantable loop recorder later today.  Co Sign: [name], MD 07/21/2014 10:43 AM

## 2014-07-21 NOTE — Consult Note (Signed)
ELECTROPHYSIOLOGY CONSULT NOTE  Patient ID: Beth Padilla MRN: [id number], DOB/AGE: 75-30-1942   Admit date: 07/18/2014 Date of Consult: 07/21/2014  Primary Physician: Kendrick Ranch, MD Primary Cardiologist: Dr Einar Gip Reason for Consultation: Cryptogenic stroke; recommendations regarding Implantable Loop Recorder  History of Present Illness Beth Padilla was admitted on 07/18/2014 with slurred speech and facial drooping.  Imaging demonstrated non dominant right frontal lobe infarct.  She has undergone workup for stroke including echocardiogram and carotid dopplers.  The patient has been monitored on telemetry which has demonstrated sinus rhythm with no arrhythmias.  Inpatient stroke work-up is to be completed with a TEE.   Echocardiogram this admission demonstrated EF 65-70%, mild LVH, no RWMA, moderate AS, LA 45.  Lab work is reviewed.  Prior to admission, the patient denies chest pain, shortness of breath, dizziness, palpitations, or syncope.  They are recovering from their stroke with plans to return home at discharge.  She states that a few years ago while hospitalized in [place], [place], she was told that she had atrial fibrillation but was not started on anticoagulation at that time.   EP has been asked to evaluate for placement of an implantable loop recorder to monitor for atrial fibrillation.  ROS is negative except as outlined above.    Past Medical History  Diagnosis Date  . Diabetes mellitus   . Hypertension   . Hypothyroidism 12/15/2010  . Hyperlipidemia 12/15/2010  . Anemia   . Depression   . Renal disorder      Surgical History:  Past Surgical History  Procedure Laterality Date  . Abdominal hysterectomy       Prescriptions prior to admission  Medication Sig Dispense Refill Last Dose  . acetaminophen (TYLENOL) 650 MG CR tablet Take 650 mg by mouth every 8 (eight) hours as needed for pain.   Past Month  . aspirin 81 MG chewable tablet Chew 81  mg by mouth daily.   07/18/2014  . escitalopram (LEXAPRO) 10 MG tablet Take 10 mg by mouth daily.   07/18/2014  . furosemide (LASIX) 40 MG tablet Take 40 mg by mouth 2 (two) times daily.   07/18/2014  . hydrALAZINE (APRESOLINE) 100 MG tablet Take 100 mg by mouth 3 (three) times daily.   07/18/2014  . ketotifen (ALAWAY) 0.025 % ophthalmic solution Place 1 drop into both eyes daily.   07/18/2014  . levothyroxine (SYNTHROID, LEVOTHROID) 100 MCG tablet Take 100 mcg by mouth daily before breakfast.   07/18/2014  . NIFEdipine (PROCARDIA-XL/ADALAT CC) 30 MG 24 hr tablet Take 30 mg by mouth 2 (two) times daily.   07/18/2014  . omeprazole (PRILOSEC) 20 MG capsule Take 20 mg by mouth daily.   07/18/2014  . PRESCRIPTION MEDICATION Take 1 capsule by mouth daily. Acetazolamide ER 500 MG   07/18/2014  . sodium bicarbonate 650 MG tablet Take 1,300 mg by mouth 2 (two) times daily.   07/18/2014  . traMADol (ULTRAM) 50 MG tablet Take 50 mg by mouth every 6 (six) hours as needed.   07/18/2014  . [DISCONTINUED] atorvastatin (LIPITOR) 40 MG tablet Take 40 mg by mouth every evening.   07/18/2014    Inpatient Medications:  . aspirin  300 mg Rectal Daily   Or  . aspirin  325 mg Oral Daily  . atorvastatin  80 mg Oral QPM  . enoxaparin (LOVENOX) injection  40 mg Subcutaneous QHS  . escitalopram  10 mg Oral Daily  . hydrALAZINE  100 mg Oral TID  . insulin  aspart  0-15 Units Subcutaneous TID WC  . insulin aspart  0-5 Units Subcutaneous QHS  . ketotifen  1 drop Both Eyes Daily  . lactose free nutrition  237 mL Oral TID WC  . levothyroxine  100 mcg Oral QAC breakfast  . NIFEdipine  30 mg Oral BID  . pantoprazole  40 mg Oral Daily  . sodium bicarbonate  1,300 mg Oral BID    Allergies:  Allergies  Allergen Reactions  . Ampicillin Rash    History   Social History  . Marital Status: Widowed    Spouse Name: N/A  . Number of Children: N/A  . Years of Education: N/A   Occupational History  . Not on file.   Social History Main  Topics  . Smoking status: Never Smoker   . Smokeless tobacco: Not on file  . Alcohol Use: No  . Drug Use: No  . Sexual Activity: Yes    Birth Control/ Protection: Surgical   Other Topics Concern  . Not on file   Social History Narrative     Family History  Problem Relation Age of Onset  . Stroke Mother   . Cancer Sister      Physical Exam: Filed Vitals:   07/20/14 1740 07/20/14 2200 07/21/14 0146 07/21/14 0535  BP: 155/40 182/45 144/47 176/51  Pulse: 53 51 71 56  Temp: 99.3 F (37.4 C) 98.8 F (37.1 C) 98.2 F (36.8 C) 98.5 F (36.9 C)  TempSrc: Oral Oral Oral Oral  Resp: 18 17 16 17   Height:      Weight:      SpO2: 100% 96% 100% 98%    GEN- The patient is well appearing, alert and oriented x 3 today.   Head- normocephalic, atraumatic Eyes-  Sclera clear, conjunctiva pink Ears- hearing intact Oropharynx- clear, poor dentition Neck- supple Lungs- Clear to ausculation bilaterally, normal work of breathing Heart- Regular rate and rhythm, 2/6 SEM GI- soft, NT, ND, + BS Extremities- no clubbing, cyanosis, or edema MS- no significant deformity or atrophy Skin- no rash or lesion Psych- euthymic mood, full affect   Labs:   Lab Results  Component Value Date   WBC 6.3 07/20/2014   HGB 8.7* 07/20/2014   HCT 28.1* 07/20/2014   MCV 95.9 07/20/2014   PLT 227 07/20/2014    Recent Labs Lab 07/18/14 2100  07/20/14 0444  NA 139  --  140  K 3.7  --  3.5  CL 111  --  112*  CO2 19*  --  18*  BUN 56*  --  39*  CREATININE 1.89*  < > 1.67*  CALCIUM 8.5*  --  8.3*  PROT 7.6  --   --   BILITOT 0.6  --   --   ALKPHOS 66  --   --   ALT 13*  --   --   AST 16  --   --   GLUCOSE 227*  --  146*  < > = values in this interval not displayed.   Radiology/Studies: Dg Chest 2 View 07/19/2014   CLINICAL DATA:  Cerebral infarction  EXAM: CHEST  2 VIEW  COMPARISON:  09/14/2013  FINDINGS: There is mild hyperinflation. There is mild interstitial coarsening, likely chronic. There  is no confluent airspace opacity. There is no effusion. Heart size is normal.  IMPRESSION: Mild hyperinflation.   Electronically Signed   By: Andreas Newport M.D.   On: 07/19/2014 03:34   Ct Head Wo Contrast 07/18/2014  CLINICAL DATA:  Right-sided facial droop and slurred speech.  EXAM: CT HEAD WITHOUT CONTRAST  TECHNIQUE: Contiguous axial images were obtained from the base of the skull through the vertex without intravenous contrast.  COMPARISON:  08/11/2004  FINDINGS: There is no intracranial hemorrhage, mass or evidence of acute infarction. There is hypodensity in the left corona radiata and posterior left lentiform nuclei which may represent old infarction in there also was hypodensity in the posterior left lentiform nuclei on the 2006 examination. There is mild generalized atrophy. There is mild microvascular ischemic change. Posterior fossa and brainstem appear unremarkable.  There is no bony abnormality. The visible paranasal sinuses are clear.  IMPRESSION: No acute findings. Mild generalized atrophy and chronic microvascular changes. 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 These results were called by telephone at the time of interpretation on 07/18/2014 at 9:19 pm to Dr. Francine Graven , who verbally acknowledged these results.   Electronically Signed   By: Andreas Newport M.D.   On: 07/18/2014 21:21   Mr Brain Wo Contrast 07/19/2014   CLINICAL DATA:  Initial evaluation for acute slurred speech.  EXAM: MRI HEAD WITHOUT CONTRAST  MRA HEAD WITHOUT CONTRAST  TECHNIQUE: Multiplanar, multiecho pulse sequences of the brain and surrounding structures were obtained without intravenous contrast. Angiographic images of the head were obtained using MRA technique without contrast.  COMPARISON:  Prior CT from 07/18/2014  FINDINGS: MRI HEAD FINDINGS  Diffuse prominence of the CSF containing spaces is compatible with generalized cerebral atrophy. Patchy T2/FLAIR  hyperintensity within the periventricular and deep white matter both cerebral hemispheres present, most consistent with chronic small vessel ischemic disease.  Remote lacunar infarct involving the left lentiform nuclei S extending superiorly through the periventricular white matter left corona radiata present. There is an additional small remote cortical infarct within the left parietal lobe. Probable additional tiny remote lacunar infarct within the right lentiform nucleus with associated chronic blood products.  There is a linear focus of restricted diffusion involving the cortical gray matter and underlying deep white matter of the right frontal lobe, compatible with acute nonhemorrhagic ischemic infarct (series 4, image 33). No associated mass effect or hemorrhage. No other cerebral infarction. Normal intravascular flow voids are maintained.  No mass lesion, midline shift, or mass effect. No hydrocephalus. No extra-axial fluid collection.  Cerebellar tonsils are somewhat low lying and PEG at the foramen magnum. There positioned approximately 3 mm below the foramen magnum without frank Chiari malformation. Craniocervical junction otherwise unremarkable. Degenerative spondylolysis noted within the visualized upper cervical spine. 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  No acute abnormality about the orbits.  Scattered mucosal thickening present within the ethmoidal air cells, sphenoid sinuses, and maxillary sinuses. No air-fluid levels to suggest active sinus infection. Minimal scattered fluid opacity present within the mastoid air cells bilaterally. Inner ear structures within normal limits.  Bone marrow signal intensity within normal limits. Scalp soft tissues unremarkable.  MRA HEAD FINDINGS  ANTERIOR CIRCULATION:  Visualized distal cervical segments of the internal carotid arteries are widely patent with antegrade flow. The internal carotid arteries appear medialized into the retropharyngeal space. Petrous  segments are widely patent. Moderate narrowing of the distal cavernous/supra clinoid segments of the internal carotid arteries is present bilaterally, slightly worse on the left. No focal high-grade stenosis. A1 segments, anterior communicating artery and anterior cerebral arteries well opacified. M1 segments widely patent bilaterally without stenosis or occlusion. MCA bifurcations within normal limits. Distal MCA branches well  opacified bilaterally.  POSTERIOR CIRCULATION:  Vertebral arteries are widely patent to the vertebrobasilar junction. Posterior inferior cerebellar arteries are patent bilaterally. Basilar artery widely patent. Anterior inferior cerebral arteries and superior cerebellar arteries are well opacified bilaterally. Posterior cerebral arteries demonstrate mild multi focal atheromatous irregularity without significant stenosis.  No aneurysm or vascular malformation.  IMPRESSION: MRI HEAD IMPRESSION:  1. Acute ischemic linear nonhemorrhagic right frontal lobe infarct as above. 2. No other acute intracranial process identified. 3. Generalized cerebral atrophy with chronic microvascular ischemic disease and small remote infarcts as detailed above. 4. Cerebellar tonsillar ectopia of 3 mm.  MRA HEAD IMPRESSION:  1. No proximal or large vessel occlusion identified within the intracranial circulation. No focal high-grade or correctable stenosis identified within the intracranial circulation. 2. Moderate narrowing of the distal cavernous/supra clinoid segments of the internal carotid arteries bilaterally, slightly worse on the left. 3. Widely patent posterior circulation.   Electronically Signed   By: Jeannine Boga M.D.   On: 07/19/2014 05:14   12-lead ECG sinus bradycardia, rate 46, non specific ST-T changes All prior EKG's in EPIC reviewed with no documented atrial fibrillation  Telemetry sinus brady, intermittent junctional rhythm, short run PAC's  Assessment and Plan:  1. Cryptogenic  stroke The patient presents with cryptogenic stroke.  The patient has a TEE planned for this AM.  I spoke at length with the patient about monitoring for afib with an implantable loop recorder.  Risks, benefits, and alteratives to implantable loop recorder were discussed with the patient today.   At this time, the patient is very clear in their decision to proceed with implantable loop recorder.   We will try to obtain records from [facility] this morning.  If the patient has documented atrial fibrillation, would anticoagulate and not place ILR  2.  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 She denies exercise intolerance, fatigue, shortness of breath, dizziness, or pre-syncope.  Would avoid BB, CCB but not further pursue in the absence of symptoms.   Please call with questions.   Chanetta Marshall, NP 07/21/2014 7:30 AM   I have seen, examined the patient, and reviewed the above assessment and plan.  Changes to above are made where necessary.  If TEE does not reveal stroke etiology, would plan for implantable loop recorder later today.  Co Sign: [name], MD 07/21/2014 10:43 AM

## 2014-07-21 NOTE — Progress Notes (Signed)
[facility] PROGRESS NOTE  Beth Padilla [id number] DOB: Jan 25, 1940 DOA: 07/18/2014  PCP: Kendrick Ranch, MD  Brief HPI: 74 year old African-American female with a past medical history of diabetes, hypertension, hypothyroidism, hyperlipidemia, presented with slurred speech. There was no extremity weakness. Patient was hospitalized for further management. She underwent stroke workup. Plan is for TEE and loop recorder today. Eventually, she will be discharged home with home health.  Past medical history:  Past Medical History  Diagnosis Date  . Diabetes mellitus   . Hypertension   . Hypothyroidism 12/15/2010  . Hyperlipidemia 12/15/2010  . Anemia   . Depression   . Renal disorder     Consultants: Neurology  Procedures:  2-D echocardiogram Study Conclusions - Left ventricle: The cavity size was normal. Wall thickness wasincreased in a pattern of mild LVH. Systolic function wasvigorous. The estimated ejection fraction was in the range of 65%to 70%. Wall motion was normal; there were no regional wallmotion abnormalities. Doppler parameters are consistent withrestrictive physiology, indicative of decreased left ventriculardiastolic compliance and/or increased left atrial pressure. - Aortic valve: Mildly calcified annulus. Trileaflet; moderatelycalcified leaflets. There was mild stenosis by planimetry,moderate by velocity. There was mild regurgitation. Mean gradient(S): 19 mm Hg. Peak gradient (S): 39 mm Hg. VTI ratio of LVOT toaortic valve: 0.46. - Mitral valve: There was mild regurgitation. - Left atrium: The atrium was severely dilated. - Right atrium: Central venous pressure (est): 8 mm Hg. - Atrial septum: A patent foramen ovale cannot be excluded. - Tricuspid valve: There was trivial regurgitation. - Pulmonary arteries: Systolic pressure was moderately increased.PA peak pressure: 52 mm Hg (S). - Pericardium, extracardiac: There was no pericardial  effusion. Impressions: - Mild LVH with LVEF 65-70%. Restrictive diastolic filling pattern.Severe left atrrial enlargement. Mild mitral regurgitation. Mildto moderate aortic stenosis as outlined above. Mild aorticregurgitation. Moderate pulmonary hypertension with PASP 52 mmHg.Cannot exclude PFO.  Carotid Dopplers 1-39% ICA stenosis. Vertebral artery flow not insonated.  TEE and loop recorder placement pending  Antibiotics: None  Subjective: Patient continues to feel well. Denies any new complaints. Speech is much improved.   Objective: Vital Signs  Filed Vitals:   07/20/14 1740 07/20/14 2200 07/21/14 0146 07/21/14 0535  BP: 155/40 182/45 144/47 176/51  Pulse: 53 51 71 56  Temp: 99.3 F (37.4 C) 98.8 F (37.1 C) 98.2 F (36.8 C) 98.5 F (36.9 C)  TempSrc: Oral Oral Oral Oral  Resp: 18 17 16 17   Height:      Weight:      SpO2: 100% 96% 100% 98%    Intake/Output Summary (Last 24 hours) at 07/21/14 0817 Last data filed at 07/20/14 0847  Gross per 24 hour  Intake    240 ml  Output      0 ml  Net    240 ml   Filed Weights   07/18/14 2048  Weight: 77.565 kg (171 lb)    General appearance: alert, cooperative, appears stated age and no distress Resp: clear to auscultation bilaterally Cardio: S1, S2, is bradycardic, regular. No S3, S4. No rubs, or bruit. Systolic murmur appreciated over the precordium. GI: soft, non-tender; bowel sounds normal; no masses,  no organomegaly Neurologic: Alert and oriented 3. Left-sided facial droop noted. Tongue is midline. Strength is equal and normal bilateral upper and lower extremities in all muscle groups.  Lab Results:  Basic Metabolic Panel:  Recent Labs Lab 07/18/14 2100 07/19/14 0021 07/20/14 0444  NA 139  --  140  K 3.7  --  3.5  CL 111  --  112*  CO2 19*  --  18*  GLUCOSE 227*  --  146*  BUN 56*  --  39*  CREATININE 1.89* 1.81* 1.67*  CALCIUM 8.5*  --  8.3*   Liver Function Tests:  Recent Labs Lab  07/18/14 2100  AST 16  ALT 13*  ALKPHOS 66  BILITOT 0.6  PROT 7.6  ALBUMIN 4.2   CBC:  Recent Labs Lab 07/18/14 2100 07/19/14 0021 07/20/14 0444  WBC 8.5 7.4 6.3  NEUTROABS 4.3  --   --   HGB 10.9* 10.3* 8.7*  HCT 35.2* 33.1* 28.1*  MCV 98.1 96.5 95.9  PLT 305 274 227   Cardiac Enzymes:  Recent Labs Lab 07/18/14 2100 07/19/14 0021 07/19/14 0615 07/19/14 1141  TROPONINI 0.12* 0.11* 0.12* 0.14*   CBG:  Recent Labs Lab 07/20/14 0635 07/20/14 1102 07/20/14 1618 07/20/14 2209 07/21/14 0633  GLUCAP 134* 188* 176* 129* 145*      Studies/Results: No results found.  Medications:  Scheduled: . aspirin  300 mg Rectal Daily   Or  . aspirin  325 mg Oral Daily  . atorvastatin  80 mg Oral QPM  . enoxaparin (LOVENOX) injection  40 mg Subcutaneous QHS  . escitalopram  10 mg Oral Daily  . hydrALAZINE  100 mg Oral TID  . insulin aspart  0-15 Units Subcutaneous TID WC  . insulin aspart  0-5 Units Subcutaneous QHS  . ketotifen  1 drop Both Eyes Daily  . lactose free nutrition  237 mL Oral TID WC  . levothyroxine  100 mcg Oral QAC breakfast  . NIFEdipine  30 mg Oral BID  . pantoprazole  40 mg Oral Daily  . sodium bicarbonate  1,300 mg Oral BID   Continuous: . sodium chloride 50 mL/hr at 07/21/14 0655   PRN:  Assessment/Plan:  Principal Problem:   Stroke Active Problems:   HTN (hypertension)   Hypothyroidism   Hyperlipidemia   Bradycardia   CKD (chronic kidney disease), stage III   Elevated troponin    Acute stroke involving the right hemisphere Speech is improved. Neurology is following. Continue aspirin for now. Continue statin. Carotid Doppler and echocardiogram as above. PT and OT recommend home health. HbA1c is 7. LDL is 106. Continue statin. Discussed with neurology. Plan is for TEE and loop recorder. This will be done today.  Minimally elevated troponin Likely related to the above. Patient denies any chest pain. EKG shows nonspecific T-wave  changes. Echocardiogram reviewed. No wall motion abnormalities. Continue aspirin. She is followed by a cardiologist in [place]. She may continue to do so.  Bradycardia Appears to be sinus. She is not on any AV nodal blocking agents. She does have a history of hypothyroidism. TSH and free T4 are normal. Heart rate remains low, improved, likely due to hydralazine. She remains asymptomatic with elevated blood pressures. Her records were reviewed. She's had low heart rates in the past as well. She used to be on a beta blocker which has been discontinued as of last year. This can be pursued further by her cardiologist as an outpatient.  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 Creatinine better than baseline. Continue bicarbonate.  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 Initiate sliding scale coverage. HbA1c is 7. Discussed with the patient and her daughter. Apparently, she was taken off of all of her diabetic medications earlier this year due to weight loss. She will need medication for better glycemic control. We will initiate Tradjenta.  Continue sliding scale coverage.  Hypothyroidism Continue Synthroid. TSH and free T4 normal.  Essential hypertension Blood pressure is improved this morning. Continue with current medications. Initially permissive hypertension was allowed.   DVT Prophylaxis: Lovenox    Code Status: Full code  Family Communication: Discussed with the patient and her daughter who was at bedside  Disposition Plan: Await stroke workup to be completed. PT and OT recommends home health. TEE and loop recorder pending. Anticipate discharge tomorrow.    LOS: 3 days   [facility] Pager [phone] 07/21/2014, 8:17 AM  If 7PM-7AM, please contact night-coverage at [email or web address], password [email or web address]

## 2014-07-21 NOTE — CV Procedure (Signed)
   TEE  Indication: Stroke  Findings:  No embolic source Negative bubble study Normal EF Mild MR/TR/AR Calcified aortic valve  See final report for full details  Candee Furbish, MD

## 2014-07-21 NOTE — Care Management Note (Signed)
Case Management Note  Patient Details  Name: [name] MRN: [id number] Date of Birth: [birth year]  Subjective/Objective:                 Stroke-right frontal lobe infarct   Action/Plan: Spoke with patient and her daughter in law [place] about [name]. They chose [facility]. Contacted [facility], spoke with Inez Catalina, set up [place], [place] and Speech. Faxed demographics, order, face to face, therapy notes and H and P to [phone] and received confirmation. Contacted patient's PCP Dr. Ward Givens office, spoke with Jasmine December, confirmed that Dr. Lunette Stands will sign [place] orders. Contacted Jermaine at [facility] and requested rolling walker and shower stool be delivered to patient's room. Patient will be staying with her son and daughter in law after discharge. Their address is [address],  [name], [place]. Cell # [id number], house# [phone]. Will continue to follow . Expected Discharge Date:                  Expected Discharge Plan:  [place]  In-House Referral:  NA  Discharge planning Services  CM Consult  Post Acute Care Choice:  Durable Medical Equipment, Home Health Choice offered to:  Adult Children  DME Arranged:  Walker rolling, Shower stool DME Agency:  [place] Arranged:  PT, OT, Speech Therapy HH Agency:  [facility]  Status of Service:  Completed, signed off  Medicare Important Message Given:  Yes-second notification given Date Medicare IM Given:    Medicare IM give by:    Date Additional Medicare IM Given:    Additional Medicare Important Message give by:     If discussed at [place] of Stay Meetings, dates discussed:    Additional Comments:  Nila Nephew, RN 07/21/2014, 11:53 AM

## 2014-07-21 NOTE — Progress Notes (Signed)
*  PRELIMINARY RESULTS* Echocardiogram Echocardiogram Transesophageal has been performed.  Beth Padilla 07/21/2014, 4:32 PM

## 2014-07-21 NOTE — Progress Notes (Signed)
STROKE TEAM PROGRESS NOTE   HISTORY Beth Padilla is a 74 y.o. female who was at home with family today. The last time she spoke to someone normally was about 1330. When her son came out on the porch to talk to her at about 1530 he noted that her speech was slurred and her face was drooped.Later in the day when family went to check on her she would not answer her phone or the door. When they finally did access her, she continued to have slurred speech. Patient was brought to [facility] and code stroke was called.   Date last known well: Date: 07/18/2014 Time last known well: Time: 13:30 tPA Given: No: Outside time window   SUBJECTIVE (INTERVAL HISTORY) Patient lying in the bed. Family at the bedside. Pt and family are from [place]. They are willing to return to [facility] for neuro followup.   OBJECTIVE Temp:  [98.2 F (36.8 C)-99.3 F (37.4 C)] 98.5 F (36.9 C) (07/05 0535) Pulse Rate:  [50-71] 56 (07/05 0535) Cardiac Rhythm:  [-] Sinus bradycardia (07/04 2300) Resp:  [16-18] 17 (07/05 0535) BP: (144-192)/(40-51) 176/51 mmHg (07/05 0535) SpO2:  [96 %-100 %] 98 % (07/05 0535)   Recent Labs Lab 07/20/14 0635 07/20/14 1102 07/20/14 1618 07/20/14 2209 07/21/14 0633  GLUCAP 134* 188* 176* 129* 145*    Recent Labs Lab 07/18/14 2100 07/19/14 0021 07/20/14 0444  NA 139  --  140  K 3.7  --  3.5  CL 111  --  112*  CO2 19*  --  18*  GLUCOSE 227*  --  146*  BUN 56*  --  39*  CREATININE 1.89* 1.81* 1.67*  CALCIUM 8.5*  --  8.3*    Recent Labs Lab 07/18/14 2100  AST 16  ALT 13*  ALKPHOS 66  BILITOT 0.6  PROT 7.6  ALBUMIN 4.2    Recent Labs Lab 07/18/14 2100 07/19/14 0021 07/20/14 0444  WBC 8.5 7.4 6.3  NEUTROABS 4.3  --   --   HGB 10.9* 10.3* 8.7*  HCT 35.2* 33.1* 28.1*  MCV 98.1 96.5 95.9  PLT 305 274 227    Recent Labs Lab 07/18/14 2100 07/19/14 0021 07/19/14 0615 07/19/14 1141  TROPONINI 0.12* 0.11* 0.12* 0.14*    Recent Labs   07/18/14 2100  LABPROT 13.6  INR 1.02    Recent Labs  07/18/14 2120  COLORURINE YELLOW  LABSPEC 1.010  PHURINE 7.0  GLUCOSEU NEGATIVE  HGBUR NEGATIVE  BILIRUBINUR NEGATIVE  KETONESUR NEGATIVE  PROTEINUR 30*  UROBILINOGEN 0.2  NITRITE NEGATIVE  LEUKOCYTESUR SMALL*       Component Value Date/Time   CHOL 173 07/19/2014 0615   TRIG 100 07/19/2014 0615   HDL 47 07/19/2014 0615   CHOLHDL 3.7 07/19/2014 0615   VLDL 20 07/19/2014 0615   LDLCALC 106* 07/19/2014 0615   Lab Results  Component Value Date   HGBA1C 6.5* 12/16/2010      Component Value Date/Time   LABOPIA NONE DETECTED 07/18/2014 2120   COCAINSCRNUR NONE DETECTED 07/18/2014 2120   LABBENZ NONE DETECTED 07/18/2014 2120   AMPHETMU NONE DETECTED 07/18/2014 2120   THCU NONE DETECTED 07/18/2014 2120   LABBARB NONE DETECTED 07/18/2014 2120     Recent Labs Lab 07/18/14 2100  ETH <5    Dg Chest 2 View 07/19/2014     Mild hyperinflation.     Ct Head Wo Contrast 07/18/2014    No acute findings. Mild generalized atrophy and chronic microvascular changes. Probable old infarction  in the left corona radiata and posterior left lentiform nuclei.   MRI HEAD  07/19/2014    1. Acute ischemic linear nonhemorrhagic right frontal lobe infarct. 2. No other acute intracranial process identified.  3. Generalized cerebral atrophy with chronic microvascular ischemic disease and small remote infarcts as detailed above.  4. Cerebellar tonsillar ectopia of 3 mm.    MRA HEAD    07/19/2014    1. No proximal or large vessel occlusion identified within the intracranial circulation. No focal high-grade or correctable stenosis identified within the intracranial circulation.  2. Moderate narrowing of the distal cavernous/supra clinoid segments of the internal carotid arteries bilaterally, slightly worse on the left.  3. Widely patent posterior circulation.     Carotid Doppler  There is 1-39% bilateral ICA stenosis. Vertebral artery flow  is antegrade.     PHYSICAL EXAM NEUROLOGIC:   MENTAL STATUS: awake, alert, oriented, language fluent with mild dysarthria, follows simple commands.  CRANIAL NERVES: pupils equal and reactive to light,extraocular motions, visual fields grossly intact intact, facial sensation intact, and left facial droop, uvula midlinec, tongue midline MOTOR: normal bulk and tone, Strength - 5/5 throughout. Diminished fine finger movements on the left. Orbits right over left upper extremity. SENSORY: normal and symmetric to light touch  COORDINATION: finger-nose-finger normal - heel to shin normal  REFLEXES: deep tendon reflexes present and symmetric GAIT: Deferred at this time   ASSESSMENT/PLAN Ms. Beth Padilla is a 74 y.o. female with history of diabetes mellitus, hypertension, hyperlipidemia, and renal dysfunction  presenting with dysarthria and facial droop. She did not receive IV t-PA due to late presentation.  Stroke:  Non-dominant right frontal lobe infarct. Etiology likely embolic with source to be determined  Resultant L facial weakness, dysarthria (improving)  MRI  acute ischemic linear nonhemorrhagic right frontal lobe infarct  MRA - no proximal or large vessel occlusion identified within the intracranial circulation  Carotid Doppler 1-39% bilateral ICA stenosis  2D Echo EF 65-70%.    Bilateral lower extremity duplex - no DVT TEE to look for embolic source. Arranged with [place] for today.  If TEE negative, a [facility] electrophysiologist will consult and consider placement of an implantable loop recorder to evaluate for atrial fibrillation as etiology of stroke. This has been explained to patient/family by Dr. Leonie Man and they are agreeable.   LDL 106  HgbA1c pending   Lovenox for VTE prophylaxis Diet NPO time specified  aspirin 81 mg orally every day prior to admission, now on aspirin 325 mg orally every day  Patient  counseled to be compliant with her antithrombotic medications  Ongoing aggressive stroke risk factor management  Therapy recommendations: HH PT, ST, OT  Disposition: home with HH therapies planned  [facility] for discharge from stroke standpoint once  TEE completed and loop placed  Patient has a 10-15% risk of having another stroke over the next year, the highest risk is within 2 weeks of the most recent stroke/TIA (risk of having a stroke following a stroke or TIA is the same). Ongoing risk factor control by Primary Care Physician Follow-up Stroke Clinic at [facility] with Dr. Antony Contras in 2 months, order placed.  Essential Hypertension  Home meds: Apresoline and Procardia  Stable  Hyperlipidemia  Home meds: Lipitor 40 mg daily   LDL 106, goal < 70  Increased Lipitor to 80 mg daily  Continue statin at discharge  Diabetes, type 2  HgbA1c pending, goal < 7.0  Uncontrolled  Other Stroke Risk Factors  Advanced age  Hx stroke/TIA (by MRI)  Family hx stroke (mother)  Other Active Problems  CKD stage 3  Anemia  Minimally elevated troponin  Bradycardia  Hypothyroidism   [facility] See Amion for Pager information 07/21/2014 10:22 AM     I, the attending vascular neurologist, have personally obtained a history, examined the patient, evaluated laboratory data, individually viewed imaging studies and agree with radiology interpretations. I obtained additional history from pt's daughter at bedside. I also discussed with daughter and Dr. Maryland Pink regarding her care plan. Together with the NP/PA, we formulated the assessment and plan of care which reflects our mutual decision.  I have made any additions or clarifications directly to the above note and agree with the findings and plan as currently documented.   74 yo F with hx of DM, HTN, HLD, Hypothyrodism admitted for slurry speech and left facial droop. MRI showed right  frontal cortical infarct. MRA neg, as well as CUS and TTE and DVT screening. LDL 106. A1C pending. Await TEE and loop recorder placement. Continue ASA 325mg . Pt likely to be RESPECT ESUS candidate. Will follow up in clinic.   Rosalin Hawking, MD PhD Stroke Neurology 07/21/2014 7:59 PM       To contact Stroke Continuity provider, please refer to [email or web address] After hours, contact General Neurology

## 2014-07-21 NOTE — Progress Notes (Signed)
Occupational Therapy Treatment Patient Details Name: Beth Padilla MRN: [id number] DOB: 1940-10-25 Today's Date: 07/21/2014    History of present illness 74 year old female who  has a past medical history of Diabetes mellitus; Hypertension; Hypothyroidism (12/15/2010); Hyperlipidemia (12/15/2010); Anemia; Depression; and Renal disorder. MRI revealed acute frontal lobe infarct   OT comments  Pt progressing towards acute OT goals. Focus of session was functional tasks in dynamic standing position. Pt completed tasks as detailed below. Min guard level for ambulation. Pt completed all tasks presented, noted fatigue at end of session. OT to continue to follow acutely. D/c plan remains appropriate.  Follow Up Recommendations  Home health OT;Supervision/Assistance - 24 hour    Equipment Recommendations  Tub/shower seat    Recommendations for Other Services      Precautions / Restrictions Precautions Precautions: Fall Restrictions Weight Bearing Restrictions: No       Mobility Bed Mobility Overal bed mobility: Needs Assistance Bed Mobility: Supine to Sit;Sit to Supine     Supine to sit: Supervision Sit to supine: Supervision   General bed mobility comments: simulated home setup with HOB flat. Extra time and effort and use of edge of mattress. No physical assist. Supervision for safety.   Transfers Overall transfer level: Needs assistance Equipment used: Rolling walker (2 wheeled) Transfers: Sit to/from Stand Sit to Stand: Min guard         General transfer comment: extra time. min guard for safety. cues for technique.    Balance Overall balance assessment: Needs assistance         Standing balance support: Bilateral upper extremity supported;During functional activity Standing balance-Leahy Scale: Fair Standing balance comment: external support improves balance                   ADL                                       Functional  mobility during ADLs: Min guard;Rolling walker General ADL Comments: Pt completed functional tasks in dynamic standing position reaching in front, horizontally and vertical at min guard level for safety. No LOB observed. Pt then completed  household distance ambulation at min guard level. Pt completed all tasks assessed, expressed some fatigue at end of session. Discussed fall prevention and cognitive exercises at home (ex. simple crossword puzzles).       Vision                     Perception     Praxis      Cognition   Behavior During Therapy: WFL for tasks assessed/performed Overall Cognitive Status: Within Functional Limits for tasks assessed                  General Comments: delayed processing    Extremity/Trunk Assessment               Exercises     Shoulder Instructions       General Comments      Pertinent Vitals/ Pain       Pain Assessment: No/denies pain  Home Living                                          Prior Functioning/Environment  Frequency Min 2X/week     Progress Toward Goals  OT Goals(current goals can now be found in the care plan section)  Progress towards OT goals: Progressing toward goals  Acute Rehab OT Goals Patient Stated Goal: home OT Goal Formulation: With patient/family Time For Goal Achievement: 07/27/14 Potential to Achieve Goals: Good ADL Goals Pt Will Perform Grooming: with modified independence;standing Pt Will Transfer to Toilet: with modified independence;ambulating;grab bars Pt Will Perform Toileting - Clothing Manipulation and hygiene: with modified independence;sit to/from stand Pt Will Perform Tub/Shower Transfer: Tub transfer;with supervision;ambulating;shower seat;rolling walker  Plan Discharge plan remains appropriate    Co-evaluation                 End of Session Equipment Utilized During Treatment: Gait belt;Rolling walker   Activity Tolerance  Patient tolerated treatment well   Patient Left in bed;with call bell/phone within reach;with family/visitor present;with bed alarm set   Nurse Communication          Time: [id number] OT Time Calculation (min): 26 min  Charges: OT General Charges $OT Visit: 1 Procedure OT Treatments $Self Care/Home Management : 23-37 mins  [name] 07/21/2014, 2:31 PM

## 2014-07-21 NOTE — Progress Notes (Signed)
Physical Therapy Treatment Patient Details Name: Beth Padilla MRN: [id number] DOB: [birth year] Today's Date: 07/21/2014    History of Present Illness 74 year old female who  has a past medical history of Diabetes mellitus; Hypertension; Hypothyroidism (12/15/2010); Hyperlipidemia (12/15/2010); Anemia; Depression; and Renal disorder. MRI revealed acute frontal lobe infarct    PT Comments    Patient progressing with ambulation safety, independence, and able to negotiate stairs again.  She will benefit from f/u HHPT.  Plan for loop recorder implant this pm and possibly home in am.  Will have family assist at home and educated them in assist on stairs.  Follow Up Recommendations  Home health PT;Supervision/Assistance - 24 hour     Equipment Recommendations  Rolling walker with 5" wheels    Recommendations for Other Services       Precautions / Restrictions Precautions Precautions: Fall Restrictions Weight Bearing Restrictions: No    Mobility  Bed Mobility Overal bed mobility: Needs Assistance Bed Mobility: Supine to Sit;Sit to Supine     Supine to sit: Supervision;HOB elevated Sit to supine: Min assist   General bed mobility comments: up to edge of bed without assist; return to supine minguard for positioning LE's into bed  Transfers Overall transfer level: Needs assistance Equipment used: Rolling walker (2 wheeled) Transfers: Sit to/from Stand Sit to Stand: Min guard         General transfer comment: for anterior weight shift  Ambulation/Gait Ambulation/Gait assistance: Supervision Ambulation Distance (Feet): 220 Feet   Gait Pattern/deviations: Step-through pattern;Decreased stride length;Trunk flexed     General Gait Details: increased time to maneuver around obstacles especially on the left side catching wheel one time on doorway; but no noted drift or veering today.  Right LE weakness evident   Stairs Stairs: Yes Stairs assistance: Min assist Stair  Management: One rail Right;Step to pattern;Forwards (and HHA) Number of Stairs: 4 General stair comments: cues to lead with strong leg first, down with weaker leg first  Wheelchair Mobility    Modified Rankin (Stroke Patients Only)       Balance Overall balance assessment: Needs assistance   Sitting balance-Leahy Scale: Good     Standing balance support: Bilateral upper extremity supported;During functional activity Standing balance-Leahy Scale: Fair Standing balance comment: needs assist UE for ambulation; static balance without UE assist                    Cognition Arousal/Alertness: Awake/alert Behavior During Therapy: WFL for tasks assessed/performed Overall Cognitive Status: Within Functional Limits for tasks assessed                 General Comments: delayed processing    Exercises      General Comments        Pertinent Vitals/Pain Pain Assessment: No/denies pain    Home Living                      Prior Function            PT Goals (current goals can now be found in the care plan section) Acute Rehab PT Goals Patient Stated Goal: home Progress towards PT goals: Progressing toward goals    Frequency  Min 4X/week    PT Plan Current plan remains appropriate    Co-evaluation             End of Session Equipment Utilized During Treatment: Gait belt Activity Tolerance: Patient tolerated treatment well Patient left: with call bell/phone within reach;in bed;with  family/visitor present     Time: [id number] PT Time Calculation (min) (ACUTE ONLY): 26 min  Charges:  $Gait Training: 23-37 mins                    G Codes:      Beth Padilla,Beth Padilla Beth Padilla, 2:33 PM  Beth Padilla, Beth Padilla Aug 19, 2014

## 2014-07-21 NOTE — Interval H&P Note (Signed)
History and Physical Interval Note:  07/21/2014 2:12 PM  Beth Padilla  has presented today for surgery, with the diagnosis of stoke  The various methods of treatment have been discussed with the patient and family. After consideration of risks, benefits and other options for treatment, the patient has consented to  Procedure(s): TRANSESOPHAGEAL ECHOCARDIOGRAM (TEE) (N/A) as a surgical intervention .  The patient's history has been reviewed, patient examined, no change in status, stable for surgery.  I have reviewed the patient's chart and labs.  Questions were answered to the patient's satisfaction.     [name]

## 2014-07-22 ENCOUNTER — Encounter (HOSPITAL_COMMUNITY): Payer: Self-pay | Admitting: Internal Medicine

## 2014-07-22 DIAGNOSIS — R7989 Other specified abnormal findings of blood chemistry: Secondary | ICD-10-CM

## 2014-07-22 DIAGNOSIS — I63412 Cerebral infarction due to embolism of left middle cerebral artery: Secondary | ICD-10-CM

## 2014-07-22 DIAGNOSIS — N183 Chronic kidney disease, stage 3 (moderate): Secondary | ICD-10-CM

## 2014-07-22 DIAGNOSIS — E039 Hypothyroidism, unspecified: Secondary | ICD-10-CM

## 2014-07-22 DIAGNOSIS — I10 Essential (primary) hypertension: Secondary | ICD-10-CM

## 2014-07-22 LAB — BASIC METABOLIC PANEL
Anion gap: 7 (ref 5–15)
BUN: 26 mg/dL — ABNORMAL HIGH (ref 6–20)
CHLORIDE: 116 mmol/L — AB (ref 101–111)
CO2: 19 mmol/L — ABNORMAL LOW (ref 22–32)
Calcium: 8.6 mg/dL — ABNORMAL LOW (ref 8.9–10.3)
Creatinine, Ser: 1.41 mg/dL — ABNORMAL HIGH (ref 0.44–1.00)
GFR calc Af Amer: 41 mL/min — ABNORMAL LOW (ref >60)
GFR calc non Af Amer: 35 mL/min — ABNORMAL LOW (ref >60)
Glucose, Bld: 132 mg/dL — ABNORMAL HIGH (ref 65–99)
POTASSIUM: 3.6 mmol/L (ref 3.5–5.1)
SODIUM: 142 mmol/L (ref 135–145)

## 2014-07-22 LAB — GLUCOSE, CAPILLARY
GLUCOSE-CAPILLARY: 156 mg/dL — AB (ref 65–99)
GLUCOSE-CAPILLARY: 247 mg/dL — AB (ref 65–99)
Glucose-Capillary: 139 mg/dL — ABNORMAL HIGH (ref 65–99)

## 2014-07-22 MED ORDER — ATORVASTATIN CALCIUM 80 MG PO TABS: 80.0000 mg | ORAL_TABLET | Freq: Every evening | ORAL | Status: DC

## 2014-07-22 MED ORDER — CIPROFLOXACIN HCL 500 MG PO TABS: 500.0000 mg | ORAL_TABLET | Freq: Two times a day (BID) | ORAL | Status: DC

## 2014-07-22 MED ORDER — LISINOPRIL 10 MG PO TABS
10.0000 mg | ORAL_TABLET | Freq: Every day | ORAL | Status: DC
  Administered 2014-07-22: 10 mg via ORAL
  Filled 2014-07-22: qty 1

## 2014-07-22 MED ORDER — POTASSIUM CHLORIDE CRYS ER 20 MEQ PO TBCR
40.0000 meq | EXTENDED_RELEASE_TABLET | Freq: Once | ORAL | Status: AC
  Administered 2014-07-22: 40 meq via ORAL
  Filled 2014-07-22: qty 2

## 2014-07-22 MED ORDER — ASPIRIN EC 325 MG PO TBEC: 325.0000 mg | DELAYED_RELEASE_TABLET | Freq: Every day | ORAL | Status: DC

## 2014-07-22 NOTE — Progress Notes (Signed)
Patient is being d/c home. D/c instructions given and patient verbalized understanding. Patient also consented to [facility]. Condition stable.

## 2014-07-22 NOTE — Progress Notes (Signed)
[facility] PROGRESS NOTE  Beth Padilla [id number] DOB: August 02, 1940 DOA: 07/18/2014  PCP: Kendrick Ranch, MD  Brief HPI: 74 year old African-American female with a past medical history of diabetes, hypertension, hypothyroidism, hyperlipidemia, presented with slurred speech. There was no extremity weakness. Patient was hospitalized for further management. She underwent stroke workup. Plan is for TEE and loop recorder today. Eventually, she will be discharged home with home health.  Subjective: Patient continues to feel well. Seen with daughter-in-law at bedside. Was about to be discharged today but blood pressure elevated, added lisinopril as creatinine is stable.  Assessment/Plan:  Principal Problem:   Stroke Active Problems:   HTN (hypertension)   Hypothyroidism   Hyperlipidemia   Bradycardia   CKD (chronic kidney disease), stage III   Elevated troponin   CVA (cerebral infarction)   Acute stroke involving the right hemisphere Speech is improved. Neurology is following. Continue aspirin for now. Continue statin. Carotid Doppler and echocardiogram as above. PT and OT recommend home health. HbA1c is 7. LDL is 106. Continue statin. Discussed with neurology. Plan is for TEE and loop recorder. This will be done today.  Essential hypertension, uncontrolled Blood pressure is improved this morning. Continue with current medications. Initially permissive hypertension was allowed.  Blood pressure is still elevated, last blood pressure was 183/48. Patient is on nifedipine and hydralazine, continued. Added lisinopril today.  slightly elevated troponin Likely secondary to the acute stroke. Patient denies any chest pain. EKG shows nonspecific T-wave changes.  Echocardiogram reviewed. No wall motion abnormalities.  Continue aspirin. She is followed by a cardiologist in [place]. She may continue to do so.  Bradycardia Appears to be sinus. She is not on any AV  nodal blocking agents. She does have a history of hypothyroidism. TSH and free T4 are normal. Heart rate remains low, improved, likely due to hydralazine. She remains asymptomatic with elevated blood pressures. Her records were reviewed. She's had low heart rates in the past as well. She used to be on a beta blocker which has been discontinued as of last year. This can be pursued further by her cardiologist as an outpatient.  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 Creatinine better than baseline. Continue bicarbonate.  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 Initiate sliding scale coverage. HbA1c is 7. Discussed with the patient and her daughter. Apparently, she was taken off of all of her diabetic medications earlier this year due to weight loss. She will need medication for better glycemic control. We will initiate Tradjenta. Continue sliding scale coverage.  Hypothyroidism Continue Synthroid. TSH and free T4 normal.   DVT Prophylaxis: Lovenox    Code Status: Full code  Family Communication: Discussed with the patient and her daughter who was at bedside  Disposition Plan: Await stroke workup to be completed. PT and OT recommends home health. TEE and loop recorder pending. Anticipate discharge tomorrow. Consultants: Neurology  Procedures:  2-D echocardiogram Study Conclusions - Left ventricle: The cavity size was normal. Wall thickness wasincreased in a pattern of mild LVH. Systolic function wasvigorous. The estimated ejection fraction was in the range of 65%to 70%. Wall motion was normal; there were no regional wallmotion abnormalities. Doppler parameters are consistent withrestrictive physiology, indicative of decreased left ventriculardiastolic compliance and/or increased left atrial pressure. - Aortic valve: Mildly calcified annulus. Trileaflet; moderatelycalcified leaflets. There was mild stenosis by planimetry,moderate by velocity. There was  mild regurgitation. Mean gradient(S): 19 mm Hg. Peak gradient (S): 39 mm Hg. VTI ratio of LVOT toaortic valve:  0.46. - Mitral valve: There was mild regurgitation. - Left atrium: The atrium was severely dilated. - Right atrium: Central venous pressure (est): 8 mm Hg. - Atrial septum: A patent foramen ovale cannot be excluded. - Tricuspid valve: There was trivial regurgitation. - Pulmonary arteries: Systolic pressure was moderately increased.PA peak pressure: 52 mm Hg (S). - Pericardium, extracardiac: There was no pericardial effusion. Impressions: - Mild LVH with LVEF 65-70%. Restrictive diastolic filling pattern.Severe left atrrial enlargement. Mild mitral regurgitation. Mildto moderate aortic stenosis as outlined above. Mild aorticregurgitation. Moderate pulmonary hypertension with PASP 52 mmHg.Cannot exclude PFO.  Carotid Dopplers 1-39% ICA stenosis. Vertebral artery flow not insonated.  TEE and loop recorder placement pending  Antibiotics: None   Objective: Vital Signs  Filed Vitals:   07/22/14 0211 07/22/14 0600 07/22/14 0655 07/22/14 0925  BP: 162/45 197/52 167/40 183/48  Pulse: 64 71  63  Temp: 98.2 F (36.8 C) 98.2 F (36.8 C)  98.1 F (36.7 C)  TempSrc: Oral Oral  Oral  Resp: 16 16  18   Height:      Weight:      SpO2: 100% 97%  97%   No intake or output data in the 24 hours ending 07/22/14 1413 Filed Weights   07/18/14 2048  Weight: 77.565 kg (171 lb)    General appearance: alert, cooperative, appears stated age and no distress Resp: clear to auscultation bilaterally Cardio: S1, S2, is bradycardic, regular. No S3, S4. No rubs, or bruit. Systolic murmur appreciated over the precordium. GI: soft, non-tender; bowel sounds normal; no masses,  no organomegaly Neurologic: Alert and oriented 3. Left-sided facial droop noted. Tongue is midline. Strength is equal and normal bilateral upper and lower extremities in all muscle groups.  Lab Results:  Basic  Metabolic Panel:  Recent Labs Lab 07/18/14 2100 07/19/14 0021 07/20/14 0444 07/22/14 0533  NA 139  --  140 142  K 3.7  --  3.5 3.6  CL 111  --  112* 116*  CO2 19*  --  18* 19*  GLUCOSE 227*  --  146* 132*  BUN 56*  --  39* 26*  CREATININE 1.89* 1.81* 1.67* 1.41*  CALCIUM 8.5*  --  8.3* 8.6*   Liver Function Tests:  Recent Labs Lab 07/18/14 2100  AST 16  ALT 13*  ALKPHOS 66  BILITOT 0.6  PROT 7.6  ALBUMIN 4.2   CBC:  Recent Labs Lab 07/18/14 2100 07/19/14 0021 07/20/14 0444  WBC 8.5 7.4 6.3  NEUTROABS 4.3  --   --   HGB 10.9* 10.3* 8.7*  HCT 35.2* 33.1* 28.1*  MCV 98.1 96.5 95.9  PLT 305 274 227   Cardiac Enzymes:  Recent Labs Lab 07/18/14 2100 07/19/14 0021 07/19/14 0615 07/19/14 1141  TROPONINI 0.12* 0.11* 0.12* 0.14*   CBG:  Recent Labs Lab 07/21/14 1123 07/21/14 1745 07/21/14 2219 07/22/14 0621 07/22/14 1134  GLUCAP 176* 150* 156* 139* 247*      Studies/Results: No results found.  Medications:  Scheduled: . aspirin  300 mg Rectal Daily   Or  . aspirin  325 mg Oral Daily  . atorvastatin  80 mg Oral QPM  . enoxaparin (LOVENOX) injection  40 mg Subcutaneous QHS  . escitalopram  10 mg Oral Daily  . furosemide  40 mg Oral BID  . hydrALAZINE  100 mg Oral TID  . insulin aspart  0-15 Units Subcutaneous TID WC  . insulin aspart  0-5 Units Subcutaneous QHS  . ketotifen  1 drop Both Eyes  Daily  . lactose free nutrition  237 mL Oral TID WC  . levothyroxine  100 mcg Oral QAC breakfast  . linagliptin  5 mg Oral Daily  . NIFEdipine  30 mg Oral BID  . pantoprazole  40 mg Oral Daily  . sodium bicarbonate  1,300 mg Oral BID   Continuous: . sodium chloride 1,000 mL (07/21/14 0829)   PRN:        LOS: 4 days   [facility]  [facility] Pager [phone]  07/22/2014, 2:13 PM  If 7PM-7AM, please contact night-coverage at [email or web address], password [facility]

## 2014-07-22 NOTE — Clinical Documentation Improvement (Signed)
Home medications include Lasix 40mg  BID. PMH list includes Congestive heart failure.   Per echo report 07/18/2014: "Mild LVH with LVEF 65-70%. Restrictive diastolic filling pattern.Marland KitchenMarland KitchenMarland KitchenDoppler parameters are consisent with restrictive physiology, indicative of decreased left ventricular diastolic copliance and/or increased left atrial pressure.   Please clarify if CHF remains a valid diagnosis for this patient, including type and acuity.  . Document acuity --Acute --Chronic --Acute on Chronic . Document type --Diastolic --Systolic --Combined systolic and diastolic  Thank You,  Carrolyn Meiers, RN [email or web address] [phone]

## 2014-07-22 NOTE — Clinical Documentation Improvement (Signed)
Abnormal Lab and/or Testing Results:  troponin I values: 0.12, 0.11, 0.12, 0.14  MD notes say minimally elevated troponin.  Can the cause of this patient's minmally elevated troponin possibly be specified?  Thank you, Carrolyn Meiers, RN [email or web address] [phone]

## 2014-07-22 NOTE — Progress Notes (Signed)
STROKE TEAM PROGRESS NOTE   HISTORY Beth Padilla is a 74 y.o. female who was at home with family today. The last time she spoke to someone normally was about 1330. When her son came out on the porch to talk to her at about 1530 he noted that her speech was slurred and her face was drooped.Later in the day when family went to check on her she would not answer her phone or the door. When they finally did access her, she continued to have slurred speech. Patient was brought to [facility] and code stroke was called.   Date last known well: Date: 07/18/2014 Time last known well: Time: 13:30 tPA Given: No: Outside time window   SUBJECTIVE (INTERVAL HISTORY) Patient lying in the bed. Family member at the bedside. Plans for discharge today. Some bleeding from loop insertion site last night, enough to get on gown.   OBJECTIVE Temp:  [98.1 F (36.7 C)-98.5 F (36.9 C)] 98.1 F (36.7 C) (07/06 0925) Pulse Rate:  [35-71] 63 (07/06 0925) Cardiac Rhythm:  [-] Sinus bradycardia (07/06 0600) Resp:  [14-18] 18 (07/06 0925) BP: (160-257)/(37-217) 183/48 mmHg (07/06 0925) SpO2:  [95 %-100 %] 97 % (07/06 0925)   Recent Labs Lab 07/21/14 0633 07/21/14 1123 07/21/14 1745 07/21/14 2219 07/22/14 0621  GLUCAP 145* 176* 150* 156* 139*    Recent Labs Lab 07/18/14 2100 07/19/14 0021 07/20/14 0444 07/22/14 0533  NA 139  --  140 142  K 3.7  --  3.5 3.6  CL 111  --  112* 116*  CO2 19*  --  18* 19*  GLUCOSE 227*  --  146* 132*  BUN 56*  --  39* 26*  CREATININE 1.89* 1.81* 1.67* 1.41*  CALCIUM 8.5*  --  8.3* 8.6*    Recent Labs Lab 07/18/14 2100  AST 16  ALT 13*  ALKPHOS 66  BILITOT 0.6  PROT 7.6  ALBUMIN 4.2    Recent Labs Lab 07/18/14 2100 07/19/14 0021 07/20/14 0444  WBC 8.5 7.4 6.3  NEUTROABS 4.3  --   --   HGB 10.9* 10.3* 8.7*  HCT 35.2* 33.1* 28.1*  MCV 98.1 96.5 95.9  PLT 305 274 227    Recent Labs Lab 07/18/14 2100 07/19/14 0021 07/19/14 0615 07/19/14 1141   TROPONINI 0.12* 0.11* 0.12* 0.14*   No results for input(s): LABPROT, INR in the last 72 hours. No results for input(s): COLORURINE, LABSPEC, [place], GLUCOSEU, HGBUR, BILIRUBINUR, KETONESUR, PROTEINUR, UROBILINOGEN, NITRITE, LEUKOCYTESUR in the last 72 hours.  Invalid input(s): APPERANCEUR     Component Value Date/Time   CHOL 173 07/19/2014 0615   TRIG 100 07/19/2014 0615   HDL 47 07/19/2014 0615   CHOLHDL 3.7 07/19/2014 0615   VLDL 20 07/19/2014 0615   LDLCALC 106* 07/19/2014 0615   Lab Results  Component Value Date   HGBA1C 7.0* 07/19/2014      Component Value Date/Time   LABOPIA NONE DETECTED 07/18/2014 2120   COCAINSCRNUR NONE DETECTED 07/18/2014 2120   LABBENZ NONE DETECTED 07/18/2014 2120   AMPHETMU NONE DETECTED 07/18/2014 2120   THCU NONE DETECTED 07/18/2014 2120   LABBARB NONE DETECTED 07/18/2014 2120     Recent Labs Lab 07/18/14 2100  ETH <5    Dg Chest 2 View 07/19/2014     Mild hyperinflation.     Ct Head Wo Contrast 07/18/2014    No acute findings. Mild generalized atrophy and chronic microvascular changes. Probable old infarction in the left corona radiata and posterior left  lentiform nuclei.   MRI HEAD  07/19/2014    1. Acute ischemic linear nonhemorrhagic right frontal lobe infarct. 2. No other acute intracranial process identified.  3. Generalized cerebral atrophy with chronic microvascular ischemic disease and small remote infarcts as detailed above.  4. Cerebellar tonsillar ectopia of 3 mm.    MRA HEAD    07/19/2014    1. No proximal or large vessel occlusion identified within the intracranial circulation. No focal high-grade or correctable stenosis identified within the intracranial circulation.  2. Moderate narrowing of the distal cavernous/supra clinoid segments of the internal carotid arteries bilaterally, slightly worse on the left.  3. Widely patent posterior circulation.     Carotid Doppler  There is 1-39% bilateral ICA stenosis. Vertebral  artery flow is antegrade.    2D Echocardiogram   Left ventricle: The cavity size was normal. Wall thickness wasincreased in a pattern of mild LVH. Systolic function wasvigorous. The estimated ejection fraction was in the range of 65%to 70%. Wall motion was normal; there were no regional wallmotion abnormalities. Doppler parameters are consistent withrestrictive physiology, indicative of decreased left ventriculardiastolic compliance and/or increased left atrial pressure. - Aortic valve: Mildly calcified annulus. Trileaflet; moderatelycalcified leaflets. There was mild stenosis by planimetry,moderate by velocity. There was mild regurgitation. Mean gradient(S): 19 mm Hg. Peak gradient (S): 39 mm Hg. VTI ratio of LVOT toaortic valve: 0.46. - Mitral valve: There was mild regurgitation. - Left atrium: The atrium was severely dilated. - Right atrium: Central venous pressure (est): 8 mm Hg. - Atrial septum: A patent foramen ovale cannot be excluded. - Tricuspid valve: There was trivial regurgitation. - Pulmonary arteries: Systolic pressure was moderately increased.PA peak pressure: 52 mm Hg (S). - Pericardium, extracardiac: There was no pericardial effusion. Impressions:  Mild LVH with LVEF 65-70%. Restrictive diastolic filling pattern.Severe left atrrial enlargement. Mild mitral regurgitation. Mildto moderate aortic stenosis as outlined above. Mild aorticregurgitation. Moderate pulmonary hypertension with PASP 52 mmHg.Cannot exclude PFO.   PHYSICAL EXAM NEUROLOGIC:  MENTAL STATUS: awake, alert, oriented, language fluent with mild dysarthria, follows simple commands.  CRANIAL NERVES: pupils equal and reactive to light,extraocular motions, visual fields grossly intact intact, facial sensation intact, and left facial droop, uvula midlinec, tongue midline MOTOR: normal bulk and tone, Strength - 5/5 throughout. Diminished fine finger movements on the left. Orbits right over left upper  extremity. SENSORY: normal and symmetric to light touch  COORDINATION: finger-nose-finger normal - heel to shin normal  REFLEXES: deep tendon reflexes present and symmetric GAIT: Deferred at this time   ASSESSMENT/PLAN Beth Padilla is a 74 y.o. female with history of diabetes mellitus, hypertension, hyperlipidemia, and renal dysfunction  presenting with dysarthria and facial droop. She did not receive IV t-PA due to late presentation.  Stroke:  Non-dominant right frontal lobe infarct. Etiology likely embolic with source to be determined  Resultant L facial weakness, dysarthria (improving)  MRI  acute ischemic linear nonhemorrhagic right frontal lobe infarct  MRA - no proximal or large vessel occlusion identified within the intracranial circulation  Carotid Doppler 1-39% bilateral ICA stenosis  2D Echo EF 65-70%.    Bilateral lower extremity duplex - no DVT  TEE No embolic source. Negative bubble study. Normal EF. Mild MR/TR/AR. Calcified aortic valve Implantable loop recorder placed by Dr. [name] 07/21/2014. Some bleeding from site over night. He is to check.  LDL 106  HgbA1c 7.0  Lovenox for VTE prophylaxis Diet heart healthy/carb modified Room service appropriate?: Yes; Fluid consistency:: Thin  aspirin 81 mg  orally every day prior to admission, now on aspirin 325 mg orally every day  Patient counseled to be compliant with her antithrombotic medications  Ongoing aggressive stroke risk factor management  Therapy recommendations: HH PT, ST, OT, RW  Disposition: home with [facility] therapies planned Patient has a 10-15% risk of having another stroke over the next year, the highest risk is within 2 weeks of the most recent stroke/TIA (risk of having a stroke following a stroke or TIA is the same). Ongoing risk factor control by Primary Care Physician Follow-up Stroke Clinic at [facility] with Dr. Antony Contras in 2 months, order placed. Pt likely to be  RESPECT ESUS candidate.  Stroke team will sign off  Essential Hypertension  Home meds: Apresoline and Procardia  Stable  Hyperlipidemia  Home meds: Lipitor 40 mg daily   LDL 106, goal < 70  Increased Lipitor to 80 mg daily  Continue statin at discharge  Diabetes, type 2  HgbA1c 7.0, goal < 7.0  Uncontrolled  Other Stroke Risk Factors  Advanced age  Hx stroke/TIA (by MRI)  Family hx stroke (mother)  Other Active Problems  CKD stage 3  Anemia  Minimally elevated troponin  Bradycardia  Hypothyroidism   Radene Journey [facility] See Amion for Pager information 07/22/2014 9:57 AM    I, the attending vascular neurologist, have personally obtained a history, examined the patient, evaluated laboratory data, individually viewed imaging studies and agree with radiology interpretations. I also discussed with daughter and Dr. Hartford Poli regarding her care plan. Together with the NP/PA, we formulated the assessment and plan of care which reflects our mutual decision. I have made any additions or clarifications directly to the above note and agree with the findings and plan as currently documented.   74 yo F with hx of DM, HTN, HLD, Hypothyrodism admitted for slurry speech and left facial droop. MRI showed right frontal cortical infarct. MRA neg, as well as CUS and TTE and DVT screening. LDL 106. A1C pending. TEE negative and loop recorder placed. Continue ASA 325mg  for now. Pt likely to be RESPECT ESUS candidate. Will follow up in clinic.   Rosalin Hawking, MD PhD Stroke Neurology 07/22/2014 2:27 PM    To contact Stroke Continuity provider, please refer to [email or web address] After hours, contact General Neurology

## 2014-07-22 NOTE — Discharge Summary (Addendum)
Physician Discharge Summary  Beth Padilla [id number] DOB: 10/24/1940 DOA: 07/18/2014  PCP: Kendrick Ranch, MD  Admit date: 07/18/2014 Discharge date: 07/22/2014  Time spent: 40 minutes  Recommendations for Outpatient Follow-up:  1. Follow-up with primary care physician within one week. 2. Follow-up as Dr. Leonie Man in 2 month. 3. Aspirin increased to 325 mg daily, Lipitor increased to 80 mg daily. 4. Follow blood pressure closely as outpatient.  Discharge Diagnoses:  Principal Problem:   Stroke Active Problems:   HTN (hypertension)   Hypothyroidism   Hyperlipidemia   Bradycardia   CKD (chronic kidney disease), stage III   Elevated troponin   CVA (cerebral infarction)   Discharge Condition: Stable  Diet recommendation: Heart healthy  Filed Weights   07/18/14 2048  Weight: 77.565 kg (171 lb)    History of present illness:  74 year old female who  has a past medical history of Diabetes mellitus; Hypertension; Hypothyroidism (12/15/2010); Hyperlipidemia (12/15/2010); Anemia; Depression; and Renal disorder. Today patient presents to the hospital with chief complaint of slurred speech which started around 3:30 PM. Patient's daughter went to check on patient around 7:30 PM as she wouldn't answer the phone or the door. After about half an hour patient finally came to the door but could seemed to open it. That is M.D. noted that patient had slurred speech and patient was brought to the ED. Initially code stroke was called by the ED physician, patient was found not to be a TPA candidate as she was out of window. Patient denies passing out, no history of seizures. Denies weakness of extremities. No previous history of stroke. Patient denies nausea vomiting or diarrhea. No chest pain or shortness of breath.  Hospital Course:   Acute stroke involving the right hemisphere Speech is improved. Neurology is following. Continue aspirin for now. Continue statin. Carotid Doppler  and echocardiogram as above. PT and OT recommend home health. HbA1c is 7. LDL is 106.  Continue statin. Discussed with neurology.  TEE done showed no source for emboli, loop recorder implanted by Dr. [name] on 07/21/14.  Essential hypertension, uncontrolled Blood pressure is improved this morning. Continue with current medications. Initially permissive hypertension was allowed.  Initially decidedto keep the patient for elevated SBP of 183/48, patient and her daughter-in-law at bedside wants to go home. Patient is on nifedipine and hydralazine,last blood pressure prior to discharge is 141/45. Patient reported that her blood pressure is very controlled with her nifedipine and hydralazine, patient to follow with primary care physician.  Minimally elevated troponin Likely secondary to the acute stroke. Patient denies any chest pain. EKG shows nonspecific T-wave changes.  Echocardiogram reviewed. No wall motion abnormalities.  Continue aspirin. She is followed by a cardiologist in [place]. She may continue to do so.  Bradycardia Appears to be sinus. She is not on any AV nodal blocking agents. She does have a history of hypothyroidism. TSH and free T4 are normal. Heart rate remains low, improved, likely due to hydralazine. She remains asymptomatic with elevated blood pressures. Her records were reviewed. She's had low heart rates in the past as well. She used to be on a beta blocker which has been discontinued as of last year. This can be pursued further by her cardiologist as an outpatient.  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 Creatinine better than baseline. Continue bicarbonate.  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 Initiate sliding scale coverage. HbA1c is 7. Discussed with the patient and her daughter. Apparently, she was taken off of  all of her diabetic medications earlier this year due to weight loss. She will need medication for better glycemic  control. We will initiate Tradjenta. Continue sliding scale coverage. Patient might need addition of ACEI or ARB if the creatinine is stable as outpatient.  Hypothyroidism Continue Synthroid. TSH and free T4 normal.   Procedures:  None  Consultations:  Urology.  Cardiology.  Discharge Exam: Filed Vitals:   07/22/14 1428  BP: 141/45  Pulse: 61  Temp: 99.4 F (37.4 C)  Resp: 18   General: Alert and awake, oriented x3, not in any acute distress. HEENT: anicteric sclera, pupils reactive to light and accommodation, EOMI CVS: S1-S2 clear, no murmur rubs or gallops Chest: clear to auscultation bilaterally, no wheezing, rales or rhonchi Abdomen: soft nontender, nondistended, normal bowel sounds, no organomegaly Extremities: no cyanosis, clubbing or edema noted bilaterally Neuro: Cranial nerves II-XII intact, no focal neurological deficits  Discharge Instructions   Discharge Instructions    Ambulatory referral to Neurology    Complete by:  As directed   Please schedule post stroke follow up in 2 months.     Diet - low sodium heart healthy    Complete by:  As directed      Increase activity slowly    Complete by:  As directed           Current Discharge Medication List    START taking these medications   Details  aspirin EC 325 MG tablet Take 1 tablet (325 mg total) by mouth daily. Qty: 30 tablet, Refills: 2    ciprofloxacin (CIPRO) 500 MG tablet Take 1 tablet (500 mg total) by mouth 2 (two) times daily. Qty: 10 tablet, Refills: 0      CONTINUE these medications which have CHANGED   Details  atorvastatin (LIPITOR) 80 MG tablet Take 1 tablet (80 mg total) by mouth every evening. Qty: 30 tablet, Refills: 2      CONTINUE these medications which have NOT CHANGED   Details  acetaminophen (TYLENOL) 650 MG CR tablet Take 650 mg by mouth every 8 (eight) hours as needed for pain.    escitalopram (LEXAPRO) 10 MG tablet Take 10 mg by mouth daily.    furosemide  (LASIX) 40 MG tablet Take 40 mg by mouth 2 (two) times daily.    hydrALAZINE (APRESOLINE) 100 MG tablet Take 100 mg by mouth 3 (three) times daily.    ketotifen (ALAWAY) 0.025 % ophthalmic solution Place 1 drop into both eyes daily.    levothyroxine (SYNTHROID, LEVOTHROID) 100 MCG tablet Take 100 mcg by mouth daily before breakfast.    NIFEdipine (PROCARDIA-XL/ADALAT CC) 30 MG 24 hr tablet Take 30 mg by mouth 2 (two) times daily.    omeprazole (PRILOSEC) 20 MG capsule Take 20 mg by mouth daily.    PRESCRIPTION MEDICATION Take 1 capsule by mouth daily. Acetazolamide ER 500 MG    sodium bicarbonate 650 MG tablet Take 1,300 mg by mouth 2 (two) times daily.    traMADol (ULTRAM) 50 MG tablet Take 50 mg by mouth every 6 (six) hours as needed.      STOP taking these medications     aspirin 81 MG chewable tablet        Allergies  Allergen Reactions  . Ampicillin Rash   Follow-up Information    Follow up with SETHI,PRAMOD, MD In 2 months.   Specialties:  Neurology, Radiology   Why:  Stroke Clinic, Office will call you with appointment date & time   Contact  information:   [address] [place] [address] [phone]       Follow up with [facility].   Why:  They will contact you to schedule home therapy visists.   Contact information:   [phone]      Follow up with [facility] On 08/05/2014.   Why:  at 2 PM   Contact information:   [place] [phone]        The results of significant diagnostics from this hospitalization (including imaging, microbiology, ancillary and laboratory) are listed below for reference.    Significant Diagnostic Studies: Dg Chest 2 View  07/19/2014   CLINICAL DATA:  Cerebral infarction  EXAM: CHEST  2 VIEW  COMPARISON:  09/14/2013  FINDINGS: There is mild hyperinflation. There is mild interstitial coarsening, likely chronic. There is no confluent airspace  opacity. There is no effusion. Heart size is normal.  IMPRESSION: Mild hyperinflation.   Electronically Signed   By: Andreas Newport M.D.   On: 07/19/2014 03:34   Ct Head Wo Contrast  07/18/2014   CLINICAL DATA:  Right-sided facial droop and slurred speech.  EXAM: CT HEAD WITHOUT CONTRAST  TECHNIQUE: Contiguous axial images were obtained from the base of the skull through the vertex without intravenous contrast.  COMPARISON:  08/11/2004  FINDINGS: There is no intracranial hemorrhage, mass or evidence of acute infarction. There is hypodensity in the left corona radiata and posterior left lentiform nuclei which may represent old infarction in there also was hypodensity in the posterior left lentiform nuclei on the 2006 examination. There is mild generalized atrophy. There is mild microvascular ischemic change. Posterior fossa and brainstem appear unremarkable.  There is no bony abnormality. The visible paranasal sinuses are clear.  IMPRESSION: No acute findings. Mild generalized atrophy and chronic microvascular changes. 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 These results were called by telephone at the time of interpretation on 07/18/2014 at 9:19 pm to Dr. Francine Graven , who verbally acknowledged these results.   Electronically Signed   By: Andreas Newport M.D.   On: 07/18/2014 21:21   Mr Brain Wo Contrast  07/19/2014   CLINICAL DATA:  Initial evaluation for acute slurred speech.  EXAM: MRI HEAD WITHOUT CONTRAST  MRA HEAD WITHOUT CONTRAST  TECHNIQUE: Multiplanar, multiecho pulse sequences of the brain and surrounding structures were obtained without intravenous contrast. Angiographic images of the head were obtained using MRA technique without contrast.  COMPARISON:  Prior CT from 07/18/2014  FINDINGS: MRI HEAD FINDINGS  Diffuse prominence of the CSF containing spaces is compatible with generalized cerebral atrophy. Patchy T2/FLAIR hyperintensity within the  periventricular and deep white matter both cerebral hemispheres present, most consistent with chronic small vessel ischemic disease.  Remote lacunar infarct involving the left lentiform nuclei S extending superiorly through the periventricular white matter left corona radiata present. There is an additional small remote cortical infarct within the left parietal lobe. Probable additional tiny remote lacunar infarct within the right lentiform nucleus with associated chronic blood products.  There is a linear focus of restricted diffusion involving the cortical gray matter and underlying deep white matter of the right frontal lobe, compatible with acute nonhemorrhagic ischemic infarct (series 4, image 33). No associated mass effect or hemorrhage. No other cerebral infarction. Normal intravascular flow voids are maintained.  No mass lesion, midline shift, or mass effect. No hydrocephalus. No extra-axial fluid collection.  Cerebellar tonsils  are somewhat low lying and PEG at the foramen magnum. There positioned approximately 3 mm below the foramen magnum without frank Chiari malformation. Craniocervical junction otherwise unremarkable. Degenerative spondylolysis noted within the visualized upper cervical spine. 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  No acute abnormality about the orbits.  Scattered mucosal thickening present within the ethmoidal air cells, sphenoid sinuses, and maxillary sinuses. No air-fluid levels to suggest active sinus infection. Minimal scattered fluid opacity present within the mastoid air cells bilaterally. Inner ear structures within normal limits.  Bone marrow signal intensity within normal limits. Scalp soft tissues unremarkable.  MRA HEAD FINDINGS  ANTERIOR CIRCULATION:  Visualized distal cervical segments of the internal carotid arteries are widely patent with antegrade flow. The internal carotid arteries appear medialized into the retropharyngeal space. Petrous segments are widely patent.  Moderate narrowing of the distal cavernous/supra clinoid segments of the internal carotid arteries is present bilaterally, slightly worse on the left. No focal high-grade stenosis. A1 segments, anterior communicating artery and anterior cerebral arteries well opacified. M1 segments widely patent bilaterally without stenosis or occlusion. MCA bifurcations within normal limits. Distal MCA branches well opacified bilaterally.  POSTERIOR CIRCULATION:  Vertebral arteries are widely patent to the vertebrobasilar junction. Posterior inferior cerebellar arteries are patent bilaterally. Basilar artery widely patent. Anterior inferior cerebral arteries and superior cerebellar arteries are well opacified bilaterally. Posterior cerebral arteries demonstrate mild multi focal atheromatous irregularity without significant stenosis.  No aneurysm or vascular malformation.  IMPRESSION: MRI HEAD IMPRESSION:  1. Acute ischemic linear nonhemorrhagic right frontal lobe infarct as above. 2. No other acute intracranial process identified. 3. Generalized cerebral atrophy with chronic microvascular ischemic disease and small remote infarcts as detailed above. 4. Cerebellar tonsillar ectopia of 3 mm.  MRA HEAD IMPRESSION:  1. No proximal or large vessel occlusion identified within the intracranial circulation. No focal high-grade or correctable stenosis identified within the intracranial circulation. 2. Moderate narrowing of the distal cavernous/supra clinoid segments of the internal carotid arteries bilaterally, slightly worse on the left. 3. Widely patent posterior circulation.   Electronically Signed   By: Jeannine Boga M.D.   On: 07/19/2014 05:14   Mr Jodene Nam Head/brain Wo Cm  07/19/2014   CLINICAL DATA:  Initial evaluation for acute slurred speech.  EXAM: MRI HEAD WITHOUT CONTRAST  MRA HEAD WITHOUT CONTRAST  TECHNIQUE: Multiplanar, multiecho pulse sequences of the brain and surrounding structures were obtained without intravenous  contrast. Angiographic images of the head were obtained using MRA technique without contrast.  COMPARISON:  Prior CT from 07/18/2014  FINDINGS: MRI HEAD FINDINGS  Diffuse prominence of the CSF containing spaces is compatible with generalized cerebral atrophy. Patchy T2/FLAIR hyperintensity within the periventricular and deep white matter both cerebral hemispheres present, most consistent with chronic small vessel ischemic disease.  Remote lacunar infarct involving the left lentiform nuclei S extending superiorly through the periventricular white matter left corona radiata present. There is an additional small remote cortical infarct within the left parietal lobe. Probable additional tiny remote lacunar infarct within the right lentiform nucleus with associated chronic blood products.  There is a linear focus of restricted diffusion involving the cortical gray matter and underlying deep white matter of the right frontal lobe, compatible with acute nonhemorrhagic ischemic infarct (series 4, image 33). No associated mass effect or hemorrhage. No other cerebral infarction. Normal intravascular flow voids are maintained.  No mass lesion, midline shift, or mass effect. No hydrocephalus. No extra-axial fluid collection.  Cerebellar tonsils are somewhat low lying and PEG  at the foramen magnum. There positioned approximately 3 mm below the foramen magnum without frank Chiari malformation. Craniocervical junction otherwise unremarkable. Degenerative spondylolysis noted within the visualized upper cervical spine. 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  No acute abnormality about the orbits.  Scattered mucosal thickening present within the ethmoidal air cells, sphenoid sinuses, and maxillary sinuses. No air-fluid levels to suggest active sinus infection. Minimal scattered fluid opacity present within the mastoid air cells bilaterally. Inner ear structures within normal limits.  Bone marrow signal intensity within normal  limits. Scalp soft tissues unremarkable.  MRA HEAD FINDINGS  ANTERIOR CIRCULATION:  Visualized distal cervical segments of the internal carotid arteries are widely patent with antegrade flow. The internal carotid arteries appear medialized into the retropharyngeal space. Petrous segments are widely patent. Moderate narrowing of the distal cavernous/supra clinoid segments of the internal carotid arteries is present bilaterally, slightly worse on the left. No focal high-grade stenosis. A1 segments, anterior communicating artery and anterior cerebral arteries well opacified. M1 segments widely patent bilaterally without stenosis or occlusion. MCA bifurcations within normal limits. Distal MCA branches well opacified bilaterally.  POSTERIOR CIRCULATION:  Vertebral arteries are widely patent to the vertebrobasilar junction. Posterior inferior cerebellar arteries are patent bilaterally. Basilar artery widely patent. Anterior inferior cerebral arteries and superior cerebellar arteries are well opacified bilaterally. Posterior cerebral arteries demonstrate mild multi focal atheromatous irregularity without significant stenosis.  No aneurysm or vascular malformation.  IMPRESSION: MRI HEAD IMPRESSION:  1. Acute ischemic linear nonhemorrhagic right frontal lobe infarct as above. 2. No other acute intracranial process identified. 3. Generalized cerebral atrophy with chronic microvascular ischemic disease and small remote infarcts as detailed above. 4. Cerebellar tonsillar ectopia of 3 mm.  MRA HEAD IMPRESSION:  1. No proximal or large vessel occlusion identified within the intracranial circulation. No focal high-grade or correctable stenosis identified within the intracranial circulation. 2. Moderate narrowing of the distal cavernous/supra clinoid segments of the internal carotid arteries bilaterally, slightly worse on the left. 3. Widely patent posterior circulation.   Electronically Signed   By: Jeannine Boga M.D.   On:  07/19/2014 05:14    Microbiology: No results found for this or any previous visit (from the past 240 hour(s)).   Labs: Basic Metabolic Panel:  Recent Labs Lab 07/18/14 2100 07/19/14 0021 07/20/14 0444 07/22/14 0533  NA 139  --  140 142  K 3.7  --  3.5 3.6  CL 111  --  112* 116*  CO2 19*  --  18* 19*  GLUCOSE 227*  --  146* 132*  BUN 56*  --  39* 26*  CREATININE 1.89* 1.81* 1.67* 1.41*  CALCIUM 8.5*  --  8.3* 8.6*   Liver Function Tests:  Recent Labs Lab 07/18/14 2100  AST 16  ALT 13*  ALKPHOS 66  BILITOT 0.6  PROT 7.6  ALBUMIN 4.2   No results for input(s): LIPASE, AMYLASE in the last 168 hours. No results for input(s): AMMONIA in the last 168 hours. CBC:  Recent Labs Lab 07/18/14 2100 07/19/14 0021 07/20/14 0444  WBC 8.5 7.4 6.3  NEUTROABS 4.3  --   --   HGB 10.9* 10.3* 8.7*  HCT 35.2* 33.1* 28.1*  MCV 98.1 96.5 95.9  PLT 305 274 227   Cardiac Enzymes:  Recent Labs Lab 07/18/14 2100 07/19/14 0021 07/19/14 0615 07/19/14 1141  TROPONINI 0.12* 0.11* 0.12* 0.14*   BNP: BNP (last 3 results) No results for input(s): BNP in the last 8760 hours.  ProBNP (last 3  results)  Recent Labs  09/14/13 1218  PROBNP 5214.0*    CBG:  Recent Labs Lab 07/21/14 1123 07/21/14 1745 07/21/14 2219 07/22/14 0621 07/22/14 1134  GLUCAP 176* 150* 156* 139* 247*       Signed:  Vera Wishart A  [facility] 07/22/2014, 2:59 PM

## 2014-07-23 MED FILL — Lidocaine Inj 1% w/ Epinephrine-1:100000: INTRAMUSCULAR | Qty: 20 | Status: AC

## 2014-08-05 ENCOUNTER — Ambulatory Visit: Payer: Medicare Other | Admitting: *Deleted

## 2014-08-05 ENCOUNTER — Telehealth: Payer: Self-pay

## 2014-08-05 DIAGNOSIS — I639 Cerebral infarction, unspecified: Secondary | ICD-10-CM

## 2014-08-05 LAB — CUP PACEART INCLINIC DEVICE CHECK: MDC IDC SESS DTM: 20160720144712

## 2014-08-05 NOTE — Progress Notes (Signed)
LINQ wound check in clinic. Site well healed with no swelling or redness. Battery status good. Normal device function. No episodes recorded. Time correct in programmer. Carelink monitor working appropriately. Monthly summary reports and PRN with JA.

## 2014-08-05 NOTE — Telephone Encounter (Signed)
LVM for patient to call office to schedule an appt with Dr. Leonie Man.  ASAP... This appt can be placed in hospital (stroke) follow up with Dr. Leonie Man

## 2014-08-19 ENCOUNTER — Encounter: Payer: Self-pay | Admitting: Internal Medicine

## 2014-08-20 ENCOUNTER — Ambulatory Visit (INDEPENDENT_AMBULATORY_CARE_PROVIDER_SITE_OTHER): Payer: Medicare Other | Admitting: *Deleted

## 2014-08-20 DIAGNOSIS — I639 Cerebral infarction, unspecified: Secondary | ICD-10-CM | POA: Diagnosis not present

## 2014-08-21 ENCOUNTER — Telehealth: Payer: Self-pay | Admitting: *Deleted

## 2014-08-21 NOTE — Telephone Encounter (Signed)
Attempted to call pt x 3 about sending manual ILR transmission.  LMTCB/sss

## 2014-08-21 NOTE — Progress Notes (Signed)
Loop recorder 

## 2014-08-25 ENCOUNTER — Encounter: Payer: Self-pay | Admitting: Internal Medicine

## 2014-08-25 LAB — CUP PACEART REMOTE DEVICE CHECK: MDC IDC SESS DTM: 20160730040500

## 2014-08-25 NOTE — Telephone Encounter (Signed)
Spoke to patient/son today about sending a manual transmission. Verbal instructions given. Son/patient voiced understanding.

## 2014-08-26 ENCOUNTER — Telehealth: Payer: Self-pay | Admitting: *Deleted

## 2014-08-26 NOTE — Telephone Encounter (Signed)
LMTCB/sss  AF noted on ILR. Per Chanetta Marshall, NP start Xarelto 15mg , D/C ASA, f/u in AF clinic first available.

## 2014-08-26 NOTE — Telephone Encounter (Signed)
Spoke to patient about AF seen on ILR. I informed patient of Chanetta Marshall, NP's recommendation. Patient stated that she was unable to take Xarelto due to a bleed she experienced in 2014. She stated that Dr.Zagol in [place], [place] placed her on Eliquis 5 mg 2-3 weeks ago and her ASA has already been D/C'd. Medications updated in Epic. Note made in [place]. Patient to keep f/u appts with Dr.Zagol as scheduled. Chanetta Marshall, NP aware.

## 2014-09-03 ENCOUNTER — Encounter: Payer: Self-pay | Admitting: Internal Medicine

## 2014-09-18 ENCOUNTER — Ambulatory Visit (INDEPENDENT_AMBULATORY_CARE_PROVIDER_SITE_OTHER): Payer: Medicare Other | Admitting: *Deleted

## 2014-09-18 DIAGNOSIS — I639 Cerebral infarction, unspecified: Secondary | ICD-10-CM

## 2014-09-23 NOTE — Progress Notes (Signed)
Loop recorder 

## 2014-09-25 LAB — CUP PACEART REMOTE DEVICE CHECK: Date Time Interrogation Session: 20160909133931

## 2014-09-25 NOTE — Progress Notes (Signed)
Carelink summary report received. Battery status OK. Normal device function. No new symptom episodes, tachy episodes, brady, or pause episodes. No new AF episodes. Monthly summary reports and ROV with JA PRN. 

## 2014-09-28 ENCOUNTER — Encounter: Payer: Self-pay | Admitting: Internal Medicine

## 2014-10-06 ENCOUNTER — Encounter: Payer: Self-pay | Admitting: Neurology

## 2014-10-06 ENCOUNTER — Ambulatory Visit (INDEPENDENT_AMBULATORY_CARE_PROVIDER_SITE_OTHER): Payer: Medicare Other | Admitting: Neurology

## 2014-10-06 VITALS — BP 147/61 | HR 56 | Ht 67.0 in | Wt 164.5 lb

## 2014-10-06 DIAGNOSIS — I639 Cerebral infarction, unspecified: Secondary | ICD-10-CM

## 2014-10-06 NOTE — Progress Notes (Signed)
Reason for visit: Stroke  Referring physician: [facility] [name] is a 74 y.o. female  History of present illness:  Beth Padilla is a 74 year old black female with a history of an admission to the hospital on 07/18/2014. The patient had onset of slurring of speech, left facial droop. The patient was noted to have a small right frontal stroke, she was not a TPA candidate secondary to delay in seeking medical attention. The patient has had full recovery from the stroke. She was discharged on aspirin, but she eventually was switched over to Eliquis as her cardiologist in [place], [place] had indicated that she was in atrial fibrillation in 2014. The patient was placed on Xarelto at that time, but she had some bleeding complications from the medication, and the Xarelto was discontinued. The patient has chronic renal insufficiency with grade 3 renal insufficiency, GFR is around 29. The patient is on full dose of Eliquis taking 5 mg twice daily. She returns to the office at this point. She had a thorough workup for stroke in the hospital that included a CT of the head, MRI of the brain, MRA of the head, carotid Doppler studies, 2-D echocardiogram, and a TEE evaluation. No evidence of a PFO was noted, but enlargement of the left atrium was seen. Ejection fraction was normal, carotid Dopplers were unremarkable. The patient did have a mild to moderate level of underlying small vessel disease on MRI of the brain that was chronic. The patient comes back to the office today for an evaluation. She uses a cane for ambulation, but she was using this prior to the stroke mainly because of arthritis in the knees. She has not had any falls. She has no residual numbness or weakness, she denies any changes in activities of daily living because of the stroke.  Past Medical History  Diagnosis Date  . Diabetes mellitus   . Hypertension   . Hypothyroidism 12/15/2010  . Hyperlipidemia 12/15/2010  .  Anemia   . Depression   . Renal disorder   . Atrial fibrillation     Past Surgical History  Procedure Laterality Date  . Abdominal hysterectomy    . Ep implantable device N/A 07/21/2014    Procedure: Loop Recorder Insertion;  Surgeon: [name], MD;  Location: [facility];  Service: Cardiovascular;  Laterality: N/A;  . Tee without cardioversion N/A 07/21/2014    Procedure: TRANSESOPHAGEAL ECHOCARDIOGRAM (TEE);  Surgeon: [name], MD;  Location: [facility] ENDOSCOPY;  Service: Cardiovascular;  Laterality: N/A;    Family History  Problem Relation Age of Onset  . Stroke Mother   . Cancer Sister     Social history:  reports that she has never smoked. She has never used smokeless tobacco. She reports that she does not drink alcohol or use illicit drugs.  Medications:  Prior to Admission medications   Medication Sig Start Date End Date Taking? Authorizing Madgeline Rayo  acetaminophen (TYLENOL) 650 MG CR tablet Take 650 mg by mouth every 8 (eight) hours as needed for pain.   Yes Historical Benjamyn Hestand, MD  apixaban (ELIQUIS) 5 MG TABS tablet Take 5 mg by mouth 2 (two) times daily. Started by Dr.Zagol in [place], [place]   Yes Historical Jaylaa Gallion, MD  atorvastatin (LIPITOR) 80 MG tablet Take 1 tablet (80 mg total) by mouth every evening. 07/22/14  Yes [name], MD  escitalopram (LEXAPRO) 10 MG tablet Take 10 mg by mouth daily.   Yes Historical Roselind Klus, MD  furosemide (LASIX)  40 MG tablet Take 40 mg by mouth 2 (two) times daily.   Yes Historical Tytus Strahle, MD  hydrALAZINE (APRESOLINE) 100 MG tablet Take 100 mg by mouth 3 (three) times daily.   Yes Historical Bader Stubblefield, MD  ketotifen (ALAWAY) 0.025 % ophthalmic solution Place 1 drop into both eyes daily.   Yes Historical Oliviana Mcgahee, MD  levothyroxine (SYNTHROID, LEVOTHROID) 100 MCG tablet Take 100 mcg by mouth daily before breakfast.   Yes Historical Advaith Lamarque, MD  NIFEdipine (PROCARDIA-XL/ADALAT CC) 30 MG 24 hr tablet Take 30 mg by mouth 2 (two) times  daily.   Yes Historical Klynn Linnemann, MD  omeprazole (PRILOSEC) 20 MG capsule Take 20 mg by mouth daily.   Yes Historical Brittanni Cariker, MD  sodium bicarbonate 650 MG tablet Take 1,300 mg by mouth 2 (two) times daily.   Yes Historical Ares Tegtmeyer, MD  amLODipine (NORVASC) 5 MG tablet Take 5 mg by mouth daily. 10/02/14   Historical Talin Rozeboom, MD      Allergies  Allergen Reactions  . Ampicillin Rash    ROS:  Out of a complete 14 system review of symptoms, the patient complains only of the following symptoms, and all other reviewed systems are negative.  Heart murmur Itching Blurred vision Cough, wheezing Feeling hot, cold Joint pain, achy muscles Allergies Headache, numbness  Blood pressure 147/61, pulse 56, height 5\' 7"  (1.702 m), weight 164 lb 8 oz (74.617 kg).  Physical Exam  General: The patient is alert and cooperative at the time of the examination.  Eyes: Pupils are equal, round, and reactive to light. Discs are flat bilaterally.  Neck: The neck is supple, no carotid bruits are noted.  Respiratory: The respiratory examination is clear.  Cardiovascular: The cardiovascular examination reveals a regular rate and rhythm, no obvious murmurs or rubs are noted.  Skin: Extremities are without significant edema.  Neurologic Exam  Mental status: The patient is alert and oriented x 3 at the time of the examination. The patient has apparent normal recent and remote memory, with an apparently normal attention span and concentration ability.  Cranial nerves: Facial symmetry is present. There is good sensation of the face to pinprick and soft touch bilaterally. The strength of the facial muscles and the muscles to head turning and shoulder shrug are normal bilaterally. Speech is well enunciated, no aphasia or dysarthria is noted. Extraocular movements are full. Visual fields are full. The tongue is midline, and the patient has symmetric elevation of the soft palate. No obvious hearing deficits  are noted.  Motor: The motor testing reveals 5 over 5 strength of all 4 extremities. Good symmetric motor tone is noted throughout.  Sensory: Sensory testing is intact to pinprick, soft touch, vibration sensation, and position sense on all 4 extremities. No evidence of extinction is noted.  Coordination: Cerebellar testing reveals good finger-nose-finger and heel-to-shin bilaterally.  Gait and station: Gait is slightly wide-based, the patient uses a cane for an ambulation. Tandem gait is slightly unsteady. Romberg is negative. No drift is seen.  Reflexes: Deep tendon reflexes are symmetric, but are depressed bilaterally. Toes are downgoing bilaterally.    Workup from admission of 07/18/2014:  Ct Head Wo Contrast 07/18/2014  No acute findings. Mild generalized atrophy and chronic microvascular changes. 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   MRI HEAD  07/19/2014  1. Acute ischemic linear nonhemorrhagic right frontal lobe infarct. 2. No other acute intracranial process identified.  3. Generalized cerebral atrophy with chronic microvascular ischemic disease and  small remote infarcts as detailed above.  4. Cerebellar tonsillar ectopia of 3 mm.   MRA HEAD  07/19/2014  1. No proximal or large vessel occlusion identified within the intracranial circulation. No focal high-grade or correctable stenosis identified within the intracranial circulation.  2. Moderate narrowing of the distal cavernous/supra clinoid segments of the internal carotid arteries bilaterally, slightly worse on the left.  3. Widely patent posterior circulation.   Carotid Doppler There is 1-39% bilateral ICA stenosis. Vertebral artery flow is antegrade.   2D Echocardiogram  Left ventricle: The cavity size was normal. Wall thickness wasincreased in a pattern of mild LVH. Systolic function wasvigorous. The estimated ejection fraction was in the range of  65%to 70%. Wall motion was normal; there were no regional wallmotion abnormalities. Doppler parameters are consistent withrestrictive physiology, indicative of decreased left ventriculardiastolic compliance and/or increased left atrial pressure. - Aortic valve: Mildly calcified annulus. Trileaflet; moderatelycalcified leaflets. There was mild stenosis by planimetry,moderate by velocity. There was mild regurgitation. Mean gradient(S): 19 mm Hg. Peak gradient (S): 39 mm Hg. VTI ratio of LVOT toaortic valve: 0.46. - Mitral valve: There was mild regurgitation. - Left atrium: The atrium was severely dilated. - Right atrium: Central venous pressure (est): 8 mm Hg. - Atrial septum: A patent foramen ovale cannot be excluded. - Tricuspid valve: There was trivial regurgitation. - Pulmonary arteries: Systolic pressure was moderately increased.PA peak pressure: 52 mm Hg (S). - Pericardium, extracardiac: There was no pericardial effusion. Impressions: Mild LVH with LVEF 65-70%. Restrictive diastolic filling pattern.Severe left atrial enlargement. Mild mitral regurgitation. Mildto moderate aortic stenosis as outlined above. Mild aorticregurgitation. Moderate pulmonary hypertension with PASP 52 mmHg.Cannot exclude PFO.   Assessment/Plan:  1. Cerebrovascular disease, prior right frontal stroke  2. History of atrial fibrillation  The patient currently is on anticoagulation. She has had a loop recorder placed, she has not been called from her cardiologist indicating that she has gone into atrial fibrillation since the hospital admission. The patient is on full dose Eliquis, the patient has chronic renal insufficiency, and the Eliquis dosing may need to be reduced to 2.5 mg twice daily because of this. I have asked the patient to contact her cardiologist regarding this. The patient will follow-up through this office on an as-needed basis otherwise.  Jill Alexanders MD 10/06/2014 8:03 PM  [facility] [address] [place], [place] [address]  Phone [phone] Fax [phone]

## 2014-10-06 NOTE — Patient Instructions (Signed)
Stroke Prevention Some medical conditions and behaviors are associated with an increased chance of having a stroke. You may prevent a stroke by making healthy choices and managing medical conditions. HOW CAN I REDUCE MY RISK OF HAVING A STROKE?   Stay physically active. Get at least 30 minutes of activity on most or all days.  Do not smoke. It may also be helpful to avoid exposure to secondhand smoke.  Limit alcohol use. Moderate alcohol use is considered to be:  No more than 2 drinks per day for men.  No more than 1 drink per day for nonpregnant women.  Eat healthy foods. This involves:  Eating 5 or more servings of fruits and vegetables a day.  Making dietary changes that address high blood pressure (hypertension), high cholesterol, diabetes, or obesity.  Manage your cholesterol levels.  Making food choices that are high in fiber and low in saturated fat, trans fat, and cholesterol may control cholesterol levels.  Take any prescribed medicines to control cholesterol as directed by your health care provider.  Manage your diabetes.  Controlling your carbohydrate and sugar intake is recommended to manage diabetes.  Take any prescribed medicines to control diabetes as directed by your health care provider.  Control your hypertension.  Making food choices that are low in salt (sodium), saturated fat, trans fat, and cholesterol is recommended to manage hypertension.  Take any prescribed medicines to control hypertension as directed by your health care provider.  Maintain a healthy weight.  Reducing calorie intake and making food choices that are low in sodium, saturated fat, trans fat, and cholesterol are recommended to manage weight.  Stop drug abuse.  Avoid taking birth control pills.  Talk to your health care provider about the risks of taking birth control pills if you are over 35 years old, smoke, get migraines, or have ever had a blood clot.  Get evaluated for sleep  disorders (sleep apnea).  Talk to your health care provider about getting a sleep evaluation if you snore a lot or have excessive sleepiness.  Take medicines only as directed by your health care provider.  For some people, aspirin or blood thinners (anticoagulants) are helpful in reducing the risk of forming abnormal blood clots that can lead to stroke. If you have the irregular heart rhythm of atrial fibrillation, you should be on a blood thinner unless there is a good reason you cannot take them.  Understand all your medicine instructions.  Make sure that other conditions (such as anemia or atherosclerosis) are addressed. SEEK IMMEDIATE MEDICAL CARE IF:   You have sudden weakness or numbness of the face, arm, or leg, especially on one side of the body.  Your face or eyelid droops to one side.  You have sudden confusion.  You have trouble speaking (aphasia) or understanding.  You have sudden trouble seeing in one or both eyes.  You have sudden trouble walking.  You have dizziness.  You have a loss of balance or coordination.  You have a sudden, severe headache with no known cause.  You have new chest pain or an irregular heartbeat. Any of these symptoms may represent a serious problem that is an emergency. Do not wait to see if the symptoms will go away. Get medical help at once. Call your local emergency services (911 in [place] Do not drive yourself to the hospital. Document Released: 02/10/2004 Document Revised: 05/19/2013 Document Reviewed: 07/05/2012 ExitCare Patient Information 2015 [employer]. This information is not intended to replace advice given   to you by your health care provider. Make sure you discuss any questions you have with your health care provider.  

## 2014-10-12 ENCOUNTER — Ambulatory Visit: Payer: Self-pay | Admitting: Neurology

## 2014-10-19 ENCOUNTER — Ambulatory Visit (INDEPENDENT_AMBULATORY_CARE_PROVIDER_SITE_OTHER): Payer: Medicare Other | Admitting: *Deleted

## 2014-10-19 ENCOUNTER — Encounter: Payer: Self-pay | Admitting: Internal Medicine

## 2014-10-19 DIAGNOSIS — I639 Cerebral infarction, unspecified: Secondary | ICD-10-CM

## 2014-10-20 NOTE — Progress Notes (Signed)
Loop recorder 

## 2014-10-24 LAB — CUP PACEART REMOTE DEVICE CHECK: Date Time Interrogation Session: 20161003210559

## 2014-10-24 NOTE — Progress Notes (Signed)
Carelink summary report received. Battery status OK. Normal device function. No new symptom episodes, tachy episodes, brady, or pause episodes. No new AF episodes. Monthly summary reports and ROV w/ JA PRN.

## 2014-11-06 ENCOUNTER — Encounter: Payer: Self-pay | Admitting: Internal Medicine

## 2014-11-18 ENCOUNTER — Ambulatory Visit (INDEPENDENT_AMBULATORY_CARE_PROVIDER_SITE_OTHER): Payer: Medicare Other | Admitting: *Deleted

## 2014-11-18 DIAGNOSIS — I639 Cerebral infarction, unspecified: Secondary | ICD-10-CM | POA: Diagnosis not present

## 2014-11-19 NOTE — Progress Notes (Signed)
Loop recorder 

## 2014-12-12 LAB — CUP PACEART REMOTE DEVICE CHECK: MDC IDC SESS DTM: 20161102213525

## 2014-12-12 NOTE — Progress Notes (Signed)
Carelink summary report received. Battery status OK. Normal device function. No new symptom episodes, tachy episodes, brady, or pause episodes. No new AF episodes, +Eliquis. Monthly summary reports and ROV with JA PRN.

## 2014-12-18 ENCOUNTER — Ambulatory Visit (INDEPENDENT_AMBULATORY_CARE_PROVIDER_SITE_OTHER): Payer: Medicare Other | Admitting: *Deleted

## 2014-12-18 DIAGNOSIS — I639 Cerebral infarction, unspecified: Secondary | ICD-10-CM

## 2014-12-21 NOTE — Progress Notes (Signed)
Carelink Summary Report / Loop Recorder 

## 2014-12-25 ENCOUNTER — Encounter: Payer: Self-pay | Admitting: Internal Medicine

## 2014-12-25 LAB — CUP PACEART REMOTE DEVICE CHECK: Date Time Interrogation Session: 20161202213528

## 2014-12-25 NOTE — Progress Notes (Signed)
Carelink summary report received. Battery status OK. Normal device function. No new symptom episodes, tachy episodes, brady, or pause episodes. No new AF episodes. Monthly summary reports PRN w/ Greggory Brandy

## 2015-01-07 ENCOUNTER — Encounter: Payer: Self-pay | Admitting: Internal Medicine

## 2015-01-19 ENCOUNTER — Ambulatory Visit (INDEPENDENT_AMBULATORY_CARE_PROVIDER_SITE_OTHER): Payer: Medicare Other | Admitting: *Deleted

## 2015-01-19 DIAGNOSIS — I639 Cerebral infarction, unspecified: Secondary | ICD-10-CM

## 2015-01-19 NOTE — Progress Notes (Signed)
Carelink Summary Report / Loop Recorder 

## 2015-02-16 ENCOUNTER — Ambulatory Visit (INDEPENDENT_AMBULATORY_CARE_PROVIDER_SITE_OTHER): Payer: Medicare Other | Admitting: *Deleted

## 2015-02-16 DIAGNOSIS — I639 Cerebral infarction, unspecified: Secondary | ICD-10-CM | POA: Diagnosis not present

## 2015-02-17 NOTE — Progress Notes (Signed)
Carelink Summary Report / Loop Recorder 

## 2015-02-19 ENCOUNTER — Encounter: Payer: Self-pay | Admitting: Internal Medicine

## 2015-02-19 ENCOUNTER — Telehealth: Payer: Self-pay | Admitting: *Deleted

## 2015-02-19 DIAGNOSIS — I480 Paroxysmal atrial fibrillation: Secondary | ICD-10-CM | POA: Insufficient documentation

## 2015-02-19 NOTE — Telephone Encounter (Signed)
Called patient to request that she send a manual transmission for confirmation of rhythm.  Reviewed transmission, ECGs suggest atrial fibrillation, patient states she is still taking her Eliquis as prescribed.  She received a recall letter in the mail (no longer visible in Epic) and wonders if she can follow-up with Dr. [name] in [place] rather than in [place].  Advised patient that I would find out if she needs a follow-up appointment with Dr. [name] and that I would call her back next week.  She verbalizes understanding of information and is appreciative of call.  Patient denies additional questions at this time.

## 2015-02-22 NOTE — Telephone Encounter (Signed)
[facility] requesting call back from patient.  Gave device clinic phone number.  Will update patient with Dr. Jackalyn Lombard recommendations when she returns call.

## 2015-02-22 NOTE — Telephone Encounter (Signed)
OK to follow with her primary cardiologist and I can see as needed.

## 2015-02-23 ENCOUNTER — Telehealth: Payer: Self-pay | Admitting: Internal Medicine

## 2015-02-23 NOTE — Telephone Encounter (Signed)
Returned patient's call.  She states she is due for follow-up with her primary cardiologist in 06/2015.  Advised patient to keep this follow-up and made her aware that Dr. [name] will follow her remotely.  Patient is agreeable to this plan and denies additional questions at this time.

## 2015-02-23 NOTE — Telephone Encounter (Signed)
See phone note from 02/19/15.

## 2015-02-23 NOTE — Telephone Encounter (Signed)
Follow up      Returning a call to [facility]

## 2015-02-24 LAB — CUP PACEART REMOTE DEVICE CHECK: Date Time Interrogation Session: 20170101220534

## 2015-03-18 ENCOUNTER — Ambulatory Visit (INDEPENDENT_AMBULATORY_CARE_PROVIDER_SITE_OTHER): Payer: Medicare Other | Admitting: *Deleted

## 2015-03-18 DIAGNOSIS — I639 Cerebral infarction, unspecified: Secondary | ICD-10-CM

## 2015-03-19 NOTE — Progress Notes (Signed)
Carelink Summary Report / Loop Recorder 

## 2015-03-23 LAB — CUP PACEART REMOTE DEVICE CHECK: MDC IDC SESS DTM: 20170131223529

## 2015-03-23 NOTE — Progress Notes (Signed)
Carelink summary report received. Battery status OK. Normal device function. No new symptom episodes, tachy episodes, brady, or pause episodes. No new AF episodes. Monthly summary reports and ROV/PRN 

## 2015-03-26 LAB — CUP PACEART REMOTE DEVICE CHECK: Date Time Interrogation Session: 20170302230624

## 2015-03-26 NOTE — Progress Notes (Signed)
Carelink summary report received. Battery status OK. Normal device function. No new symptom episodes, tachy episodes, brady, or pause episodes. 1.6% AF, +Eliquis, V rates controlled.  Monthly summary reports and ROV/PRN

## 2015-04-19 ENCOUNTER — Ambulatory Visit (INDEPENDENT_AMBULATORY_CARE_PROVIDER_SITE_OTHER): Payer: Medicare Other | Admitting: *Deleted

## 2015-04-19 DIAGNOSIS — I639 Cerebral infarction, unspecified: Secondary | ICD-10-CM

## 2015-04-19 NOTE — Progress Notes (Signed)
Carelink Summary Report / Loop Recorder 

## 2015-05-17 ENCOUNTER — Ambulatory Visit (INDEPENDENT_AMBULATORY_CARE_PROVIDER_SITE_OTHER): Payer: Medicare Other | Admitting: *Deleted

## 2015-05-17 DIAGNOSIS — I639 Cerebral infarction, unspecified: Secondary | ICD-10-CM

## 2015-05-18 NOTE — Progress Notes (Signed)
Carelink Summary Report / Loop Recorder 

## 2015-06-12 LAB — CUP PACEART REMOTE DEVICE CHECK: MDC IDC SESS DTM: 20170401230823

## 2015-06-12 NOTE — Progress Notes (Signed)
Carelink summary report received. Battery status OK. Normal device function. No new symptom episodes, tachy episodes, brady, or pause episodes. 10.2% AF,+Eliquis, V rates controlled. Monthly summary reports and ROV/PRN

## 2015-06-16 ENCOUNTER — Ambulatory Visit (INDEPENDENT_AMBULATORY_CARE_PROVIDER_SITE_OTHER): Payer: Medicare Other | Admitting: *Deleted

## 2015-06-16 DIAGNOSIS — I639 Cerebral infarction, unspecified: Secondary | ICD-10-CM

## 2015-06-17 NOTE — Progress Notes (Signed)
Carelink Summary Report / Loop Recorder 

## 2015-06-25 LAB — CUP PACEART REMOTE DEVICE CHECK: Date Time Interrogation Session: 20170501233759

## 2015-07-16 ENCOUNTER — Ambulatory Visit (INDEPENDENT_AMBULATORY_CARE_PROVIDER_SITE_OTHER): Payer: Medicare Other | Admitting: *Deleted

## 2015-07-16 DIAGNOSIS — I639 Cerebral infarction, unspecified: Secondary | ICD-10-CM

## 2015-07-19 NOTE — Progress Notes (Signed)
Carelink Summary Report / Loop Recorder 

## 2015-07-28 LAB — CUP PACEART REMOTE DEVICE CHECK: Date Time Interrogation Session: 20170601000558

## 2015-08-05 LAB — CUP PACEART REMOTE DEVICE CHECK: Date Time Interrogation Session: 20170701000921

## 2015-08-10 ENCOUNTER — Telehealth: Payer: Self-pay | Admitting: *Deleted

## 2015-08-10 ENCOUNTER — Encounter: Payer: Self-pay | Admitting: *Deleted

## 2015-08-10 NOTE — Telephone Encounter (Signed)
Called patient to schedule her for an appointment with Dillon Bjork, PA, per Dr. Jackalyn Lombard recommendation.  Patient is agreeable to an appointment on 09/09/15 at 1:00pm to discuss AF burden on LINQ.  Will mail reminder letter to patient today.  She verbalizes understanding of instructions and is appreciative of call.

## 2015-08-16 ENCOUNTER — Ambulatory Visit (INDEPENDENT_AMBULATORY_CARE_PROVIDER_SITE_OTHER): Payer: Medicare Other | Admitting: *Deleted

## 2015-08-16 DIAGNOSIS — I639 Cerebral infarction, unspecified: Secondary | ICD-10-CM

## 2015-08-16 NOTE — Progress Notes (Signed)
Carelink Summary Report / Loop Recorder 

## 2015-08-20 ENCOUNTER — Encounter: Payer: Self-pay | Admitting: Physician Assistant

## 2015-09-09 ENCOUNTER — Encounter: Payer: Medicare Other | Admitting: Physician Assistant

## 2015-09-12 LAB — CUP PACEART REMOTE DEVICE CHECK: MDC IDC SESS DTM: 20170731001154

## 2015-09-14 ENCOUNTER — Ambulatory Visit (INDEPENDENT_AMBULATORY_CARE_PROVIDER_SITE_OTHER): Payer: Medicare Other | Admitting: *Deleted

## 2015-09-14 DIAGNOSIS — I639 Cerebral infarction, unspecified: Secondary | ICD-10-CM | POA: Diagnosis not present

## 2015-09-15 NOTE — Progress Notes (Signed)
Carelink Summary Report / Loop Recorder 

## 2015-10-09 LAB — CUP PACEART REMOTE DEVICE CHECK: MDC IDC SESS DTM: 20170830003641

## 2015-10-09 NOTE — Progress Notes (Signed)
Carelink summary report received. Battery status OK. Normal device function. No new symptom episodes, tachy episodes, brady, or pause episodes. 86.5% AF, +eliquis, V rates controlled. Monthly summary reports and ROV/PRN

## 2015-10-14 ENCOUNTER — Ambulatory Visit (INDEPENDENT_AMBULATORY_CARE_PROVIDER_SITE_OTHER): Payer: Medicare Other | Admitting: *Deleted

## 2015-10-14 DIAGNOSIS — I639 Cerebral infarction, unspecified: Secondary | ICD-10-CM | POA: Diagnosis not present

## 2015-10-15 NOTE — Progress Notes (Signed)
Carelink Summary Report / Loop Recorder 

## 2015-10-31 NOTE — Progress Notes (Addendum)
Cardiology Office Note Date:  11/01/2015  Patient ID:  Beth, Padilla [birth year], MRN [id number] PCP:  Kendrick Ranch, MD  Cardiologist:  Dr.Zagol in [place], [place] Electrophysiologist: Dr. Kirby Crigler   Chief Complaint: ILR evaluation, reprogram to record longest episode, known AF  History of Present Illness: Beth Padilla is a 75 y.o. female with history of stroke followed by ILR implant subsequently finding PAFib and placed on Eliquis, asymptomatic bradycardia/junctional rhythm noted at hospital stay with her stroke, HTN, diastolic CHF, hypothyroidism.  She comes in today to be seen for Dr. [name], with known AF, for the purpose of reprogramming her ILR for longest AF episodes only.  She is accompanied by her daughter today, reports feeling quite well.  She is unaware of her AFib, denies any sensation of palpitations, reports good exertional capacity, able to do her ADL without difficulty, lives with her sone, takes care of the house work and walks for exercise.  Denies any CP or SOB. No dizziness, near syncope or syncope.  She denies any belssdig or signs of bleeding.  She reports seeing her cardiologist in [place] every 3 months.  Past Medical History:  Diagnosis Date  . Anemia   . Atrial fibrillation   . Depression   . Diabetes mellitus   . Hyperlipidemia 12/15/2010  . Hypertension   . Hypothyroidism 12/15/2010  . Renal disorder     Past Surgical History:  Procedure Laterality Date  . ABDOMINAL HYSTERECTOMY    . EP IMPLANTABLE DEVICE N/A 07/21/2014   Procedure: Loop Recorder Insertion;  Surgeon: [name], MD;  Location: [facility];  Service: Cardiovascular;  Laterality: N/A;  . TEE WITHOUT CARDIOVERSION N/A 07/21/2014   Procedure: TRANSESOPHAGEAL ECHOCARDIOGRAM (TEE);  Surgeon: [name], MD;  Location: [facility] ENDOSCOPY;  Service: Cardiovascular;  Laterality: N/A;    Current Outpatient Prescriptions  Medication Sig Dispense Refill  .  acetaminophen (TYLENOL) 650 MG CR tablet Take 650 mg by mouth every 8 (eight) hours as needed for pain.    Marland Kitchen amLODipine (NORVASC) 5 MG tablet Take 5 mg by mouth daily.    Marland Kitchen apixaban (ELIQUIS) 5 MG TABS tablet Take 5 mg by mouth 2 (two) times daily. Started by Dr.Zagol in [place], [place]    . atorvastatin (LIPITOR) 80 MG tablet Take 1 tablet (80 mg total) by mouth every evening. 30 tablet 2  . chlorthalidone (HYGROTON) 50 MG tablet     . escitalopram (LEXAPRO) 10 MG tablet Take 10 mg by mouth daily.    . hydrALAZINE (APRESOLINE) 100 MG tablet Take 100 mg by mouth 3 (three) times daily.    . isosorbide dinitrate (ISORDIL) 30 MG tablet Take 30 mg by mouth 3 (three) times daily.     Marland Kitchen ketotifen (ALAWAY) 0.025 % ophthalmic solution Place 1 drop into both eyes daily.    Marland Kitchen levothyroxine (SYNTHROID, LEVOTHROID) 100 MCG tablet Take 100 mcg by mouth daily before breakfast.    . NIFEdipine (PROCARDIA-XL/ADALAT CC) 30 MG 24 hr tablet Take 30 mg by mouth 2 (two) times daily.    Marland Kitchen omeprazole (PRILOSEC) 20 MG capsule Take 20 mg by mouth daily.    . sodium bicarbonate 650 MG tablet Take 1,300 mg by mouth 2 (two) times daily.     No current facility-administered medications for this visit.     Allergies:   Ampicillin   Social History:  The patient  reports that she has never smoked. She has never used smokeless tobacco. She reports that she  does not drink alcohol or use drugs.   Family History:  The patient's family history includes Cancer in her sister; Stroke in her mother.  ROS:  Please see the history of present illness.  All other systems are reviewed and otherwise negative.   PHYSICAL EXAM:  VS:  BP 110/63   Pulse 83   Ht 5\' 7"  (1.702 m)   Wt 164 lb (74.4 kg)   BMI 25.69 kg/m  BMI: Body mass index is 25.69 kg/m. Well nourished, well developed, in no acute distress  HEENT: normocephalic, atraumatic  Neck: no JVD, carotid bruits or masses Cardiac:  IRRR, no significant murmurs, no rubs, or  gallops Lungs:  clear to auscultation bilaterally, no wheezing, rhonchi or rales  Abd: soft, non-tender MS: no deformity ager appropriate atrophy Ext: no edema  Skin: warm and dry, no rash Neuro:  No gross deficits appreciated Psych: euthymic mood, full affect  ILR site is stable, no tethering or discomfort   EKG:  Done today and reviewed by myself shows Afib 83bpm, T changes appear similar to older EKGs ILR interrogation today: battery is good, R wave 1.72mV, 26.8% AFib  Recent Labs: No results found for requested labs within last 8760 hours.  No results found for requested labs within last 8760 hours.   CrCl cannot be calculated (Patient's most recent lab result is older than the maximum 21 days allowed.).   Wt Readings from Last 3 Encounters:  11/01/15 164 lb (74.4 kg)  10/06/14 164 lb 8 oz (74.6 kg)  07/18/14 171 lb (77.6 kg)     Other studies reviewed: Additional studies/records reviewed today include: summarized above  ASSESSMENT AND PLAN:  1. PAFib     CHA2DS2Vasc is at least 6, on Eliquis     Hx of hemoptysis on Xarelto     Asymptomatic with her AF  2. HTN     stable  3. Asymptomatic bradycardia      She denies symptoms of bradycardia  Disposition: F/u with her local cardiologist as instructed by him, here as needed, will continue monthly ILR transmissions  ADDEND: The ILR was reprogrammed to longest AF episode storage only  Current medicines are reviewed at length with the patient today.  The patient did not have any concerns regarding medicines.  Haywood Lasso, PA-C 11/01/2015 10:49 AM     [facility] [place] [address] [phone] (office)  [phone] (fax)

## 2015-11-01 ENCOUNTER — Encounter: Payer: Self-pay | Admitting: Physician Assistant

## 2015-11-01 ENCOUNTER — Ambulatory Visit (INDEPENDENT_AMBULATORY_CARE_PROVIDER_SITE_OTHER): Payer: Medicare Other | Admitting: Physician Assistant

## 2015-11-01 VITALS — BP 110/63 | HR 83 | Ht 67.0 in | Wt 164.0 lb

## 2015-11-01 DIAGNOSIS — I480 Paroxysmal atrial fibrillation: Secondary | ICD-10-CM | POA: Diagnosis not present

## 2015-11-01 DIAGNOSIS — I10 Essential (primary) hypertension: Secondary | ICD-10-CM

## 2015-11-01 DIAGNOSIS — I639 Cerebral infarction, unspecified: Secondary | ICD-10-CM

## 2015-11-01 NOTE — Patient Instructions (Signed)
Medication Instructions:   Your physician recommends that you continue on your current medications as directed. Please refer to the Current Medication list given to you today.   If you need a refill on your cardiac medications before your next appointment, please call your pharmacy.  Labwork: NONE ORDER TODAY    Testing/Procedures: NONE ORDER TODAY    Follow-Up:  CONTACT [facility] [phone] AS NEEDED FOR  ANY CARDIAC RELATED SYMPTOMS  MAKE SURE YOU KEEP WITH MONTHLY LOOP TRANSMISSONS   Any Other Special Instructions Will Be Listed Below (If Applicable).

## 2015-11-15 ENCOUNTER — Ambulatory Visit (INDEPENDENT_AMBULATORY_CARE_PROVIDER_SITE_OTHER): Payer: Medicare Other | Admitting: *Deleted

## 2015-11-15 DIAGNOSIS — I639 Cerebral infarction, unspecified: Secondary | ICD-10-CM

## 2015-11-15 NOTE — Progress Notes (Signed)
Carelink Summary Report / Loop Recorder 

## 2015-11-26 LAB — CUP PACEART REMOTE DEVICE CHECK
Date Time Interrogation Session: 20170929003922
Implantable Pulse Generator Implant Date: 20160705

## 2015-11-26 NOTE — Progress Notes (Signed)
Carelink summary report received. Battery status OK. Normal device function. No new symptom episodes, tachy episodes, brady, or pause episodes. 934 AF episodes 78.4% +eliquis. Monthly summary reports and ROV/PRN

## 2015-12-15 ENCOUNTER — Ambulatory Visit (INDEPENDENT_AMBULATORY_CARE_PROVIDER_SITE_OTHER): Payer: Medicare Other | Admitting: *Deleted

## 2015-12-15 DIAGNOSIS — I639 Cerebral infarction, unspecified: Secondary | ICD-10-CM | POA: Diagnosis not present

## 2015-12-15 NOTE — Progress Notes (Signed)
Carelink Summary Report / Loop Recorder 

## 2015-12-20 LAB — CUP PACEART REMOTE DEVICE CHECK
Implantable Pulse Generator Implant Date: 20160705
MDC IDC SESS DTM: 20171029024117

## 2015-12-20 NOTE — Progress Notes (Signed)
Carelink summary report received. Battery status OK. Normal device function. No new symptom episodes, tachy episodes, brady, or pause episodes. 1016 AF 69.4% +Eliquis. Monthly summary reports and ROV/PRN

## 2016-01-12 ENCOUNTER — Ambulatory Visit (INDEPENDENT_AMBULATORY_CARE_PROVIDER_SITE_OTHER): Payer: Medicare Other | Admitting: *Deleted

## 2016-01-12 DIAGNOSIS — I639 Cerebral infarction, unspecified: Secondary | ICD-10-CM | POA: Diagnosis not present

## 2016-01-13 NOTE — Progress Notes (Signed)
Carelink Summary Report / Loop Recorder 

## 2016-01-25 LAB — CUP PACEART REMOTE DEVICE CHECK
Date Time Interrogation Session: 20171128034253
MDC IDC PG IMPLANT DT: 20160705

## 2016-02-11 ENCOUNTER — Ambulatory Visit (INDEPENDENT_AMBULATORY_CARE_PROVIDER_SITE_OTHER): Payer: Medicare Other | Admitting: *Deleted

## 2016-02-11 DIAGNOSIS — I639 Cerebral infarction, unspecified: Secondary | ICD-10-CM

## 2016-02-14 NOTE — Progress Notes (Signed)
Carelink Summary Report / Loop Recorder 

## 2016-02-24 LAB — CUP PACEART REMOTE DEVICE CHECK
Implantable Pulse Generator Implant Date: 20160705
MDC IDC SESS DTM: 20171228043841

## 2016-03-13 ENCOUNTER — Ambulatory Visit (INDEPENDENT_AMBULATORY_CARE_PROVIDER_SITE_OTHER): Payer: Medicare Other | Admitting: *Deleted

## 2016-03-13 DIAGNOSIS — I639 Cerebral infarction, unspecified: Secondary | ICD-10-CM

## 2016-03-13 LAB — CUP PACEART REMOTE DEVICE CHECK
Date Time Interrogation Session: 20180127044028
MDC IDC PG IMPLANT DT: 20160705

## 2016-03-13 NOTE — Progress Notes (Signed)
Carelink Summary Report / Loop Recorder 

## 2016-03-29 LAB — CUP PACEART REMOTE DEVICE CHECK
Date Time Interrogation Session: 20180226050857
Implantable Pulse Generator Implant Date: 20160705

## 2016-04-12 ENCOUNTER — Ambulatory Visit (INDEPENDENT_AMBULATORY_CARE_PROVIDER_SITE_OTHER): Payer: Medicare Other | Admitting: *Deleted

## 2016-04-12 DIAGNOSIS — I639 Cerebral infarction, unspecified: Secondary | ICD-10-CM | POA: Diagnosis not present

## 2016-04-12 NOTE — Progress Notes (Signed)
Carelink Summary Report / Loop Recorder 

## 2016-04-27 ENCOUNTER — Telehealth: Payer: Self-pay | Admitting: *Deleted

## 2016-04-27 LAB — CUP PACEART REMOTE DEVICE CHECK
Date Time Interrogation Session: 20180328054032
Implantable Pulse Generator Implant Date: 20160705

## 2016-04-27 NOTE — Telephone Encounter (Signed)
[facility] requesting call back.  [facility] phone number for return call.  Tachy episode ECG from 03/29/16 at 0838 suggests wide-complex tachycardia, will determine if patient symptomatic with episode.  Tachy alerts reenabled in [place] (previously disabled due to known A-fib).

## 2016-05-01 NOTE — Telephone Encounter (Signed)
Spoke with patient.  She denies any recent syncope or presyncope.  Patient agrees to call our office in the future if she experiences these symptoms.  She is appreciative of call and denies any additional questions or concerns at this time.

## 2016-05-12 ENCOUNTER — Ambulatory Visit (INDEPENDENT_AMBULATORY_CARE_PROVIDER_SITE_OTHER): Payer: Medicare Other | Admitting: *Deleted

## 2016-05-12 DIAGNOSIS — I639 Cerebral infarction, unspecified: Secondary | ICD-10-CM | POA: Diagnosis not present

## 2016-05-12 NOTE — Progress Notes (Signed)
Carelink Summary Report / Loop Recorder 

## 2016-05-25 LAB — CUP PACEART REMOTE DEVICE CHECK
Date Time Interrogation Session: 20180427060902
MDC IDC PG IMPLANT DT: 20160705

## 2016-06-13 ENCOUNTER — Ambulatory Visit (INDEPENDENT_AMBULATORY_CARE_PROVIDER_SITE_OTHER): Payer: Medicare Other | Admitting: *Deleted

## 2016-06-13 DIAGNOSIS — I639 Cerebral infarction, unspecified: Secondary | ICD-10-CM

## 2016-06-13 NOTE — Progress Notes (Signed)
Carelink Summary Report / Loop Recorder 

## 2016-06-16 LAB — CUP PACEART REMOTE DEVICE CHECK
Date Time Interrogation Session: 20180527060854
MDC IDC PG IMPLANT DT: 20160705

## 2016-07-11 ENCOUNTER — Ambulatory Visit (INDEPENDENT_AMBULATORY_CARE_PROVIDER_SITE_OTHER): Payer: Medicare HMO | Admitting: *Deleted

## 2016-07-11 DIAGNOSIS — I639 Cerebral infarction, unspecified: Secondary | ICD-10-CM

## 2016-07-11 NOTE — Progress Notes (Signed)
Carelink Summary Report / Loop Recorder 

## 2016-07-23 LAB — CUP PACEART REMOTE DEVICE CHECK
Date Time Interrogation Session: 20180626081234
MDC IDC PG IMPLANT DT: 20160705

## 2016-07-23 NOTE — Progress Notes (Signed)
Carelink summary report received. Battery status OK. Normal device function. No new symptom episodes, tachy episodes, brady, or pause episodes. 477 Af 68.3% No ECGs. +Eliquis. Monthly summary reports and ROV/PRN

## 2016-08-09 ENCOUNTER — Telehealth: Payer: Self-pay | Admitting: Cardiology

## 2016-08-09 NOTE — Telephone Encounter (Signed)
Spoke w/ pt and requested that she send a manual transmission b/c her home monitor has not updated in at least 14 days.   

## 2016-08-10 ENCOUNTER — Ambulatory Visit (INDEPENDENT_AMBULATORY_CARE_PROVIDER_SITE_OTHER): Payer: Medicare HMO | Admitting: *Deleted

## 2016-08-10 DIAGNOSIS — I639 Cerebral infarction, unspecified: Secondary | ICD-10-CM

## 2016-08-10 NOTE — Progress Notes (Signed)
Carelink Summary Report / Loop Recorder 

## 2016-08-18 ENCOUNTER — Encounter: Payer: Self-pay | Admitting: Cardiology

## 2016-08-24 LAB — CUP PACEART REMOTE DEVICE CHECK
Date Time Interrogation Session: 20180726100928
Implantable Pulse Generator Implant Date: 20160705

## 2016-09-06 ENCOUNTER — Encounter: Payer: Self-pay | Admitting: Cardiology

## 2016-09-11 ENCOUNTER — Encounter: Payer: Medicare HMO | Admitting: *Deleted

## 2016-09-15 ENCOUNTER — Encounter: Payer: Self-pay | Admitting: Cardiology

## 2016-10-09 ENCOUNTER — Ambulatory Visit (INDEPENDENT_AMBULATORY_CARE_PROVIDER_SITE_OTHER): Payer: Medicare HMO | Admitting: *Deleted

## 2016-10-09 DIAGNOSIS — I639 Cerebral infarction, unspecified: Secondary | ICD-10-CM

## 2016-10-09 NOTE — Progress Notes (Signed)
Carelink Summary Report / Loop Recorder 

## 2016-10-10 LAB — CUP PACEART REMOTE DEVICE CHECK
Date Time Interrogation Session: 20180924141257
Implantable Pulse Generator Implant Date: 20160705

## 2016-11-08 ENCOUNTER — Ambulatory Visit (INDEPENDENT_AMBULATORY_CARE_PROVIDER_SITE_OTHER): Payer: Medicare HMO | Admitting: *Deleted

## 2016-11-08 DIAGNOSIS — I639 Cerebral infarction, unspecified: Secondary | ICD-10-CM | POA: Diagnosis not present

## 2016-11-08 NOTE — Progress Notes (Signed)
Carelink Summary Report / Loop Recorder 

## 2016-11-09 ENCOUNTER — Encounter: Payer: Self-pay | Admitting: Cardiology

## 2016-11-09 LAB — CUP PACEART REMOTE DEVICE CHECK
Date Time Interrogation Session: 20181024141312
Implantable Pulse Generator Implant Date: 20160705

## 2016-11-23 ENCOUNTER — Encounter: Payer: Self-pay | Admitting: Cardiology

## 2016-12-11 ENCOUNTER — Encounter: Payer: Medicare HMO | Admitting: *Deleted

## 2017-01-08 ENCOUNTER — Ambulatory Visit (INDEPENDENT_AMBULATORY_CARE_PROVIDER_SITE_OTHER): Payer: Medicare HMO | Admitting: *Deleted

## 2017-01-08 DIAGNOSIS — I639 Cerebral infarction, unspecified: Secondary | ICD-10-CM

## 2017-01-10 NOTE — Progress Notes (Signed)
Carelink Summary Report / Loop Recorder 

## 2017-01-19 LAB — CUP PACEART REMOTE DEVICE CHECK
Implantable Pulse Generator Implant Date: 20160705
MDC IDC SESS DTM: 20181223143915

## 2017-02-06 ENCOUNTER — Ambulatory Visit (INDEPENDENT_AMBULATORY_CARE_PROVIDER_SITE_OTHER): Payer: Medicare HMO | Admitting: *Deleted

## 2017-02-06 DIAGNOSIS — I639 Cerebral infarction, unspecified: Secondary | ICD-10-CM

## 2017-02-07 NOTE — Progress Notes (Signed)
Carelink Summary Report / Loop Recorder 

## 2017-02-13 LAB — CUP PACEART REMOTE DEVICE CHECK
Date Time Interrogation Session: 20190122151133
Implantable Pulse Generator Implant Date: 20160705

## 2017-03-12 ENCOUNTER — Ambulatory Visit (INDEPENDENT_AMBULATORY_CARE_PROVIDER_SITE_OTHER): Payer: Medicare PPO | Admitting: *Deleted

## 2017-03-12 DIAGNOSIS — I639 Cerebral infarction, unspecified: Secondary | ICD-10-CM

## 2017-03-12 NOTE — Progress Notes (Signed)
Carelink Summary Report / Loop Recorder 

## 2017-04-13 ENCOUNTER — Ambulatory Visit (INDEPENDENT_AMBULATORY_CARE_PROVIDER_SITE_OTHER): Payer: Medicare PPO | Admitting: *Deleted

## 2017-04-13 DIAGNOSIS — I639 Cerebral infarction, unspecified: Secondary | ICD-10-CM | POA: Diagnosis not present

## 2017-04-16 NOTE — Progress Notes (Signed)
Carelink Summary Report / Loop Recorder 

## 2017-04-17 LAB — CUP PACEART REMOTE DEVICE CHECK
Date Time Interrogation Session: 20190224184125
Implantable Pulse Generator Implant Date: 20160705

## 2017-05-16 ENCOUNTER — Ambulatory Visit (INDEPENDENT_AMBULATORY_CARE_PROVIDER_SITE_OTHER): Payer: Medicare PPO | Admitting: *Deleted

## 2017-05-16 DIAGNOSIS — I639 Cerebral infarction, unspecified: Secondary | ICD-10-CM

## 2017-05-17 NOTE — Progress Notes (Signed)
Carelink Summary Report / Loop Recorder 

## 2017-05-18 LAB — CUP PACEART REMOTE DEVICE CHECK
Date Time Interrogation Session: 20190329193910
Implantable Pulse Generator Implant Date: 20160705

## 2017-06-11 LAB — CUP PACEART REMOTE DEVICE CHECK
Date Time Interrogation Session: 20190501193735
Implantable Pulse Generator Implant Date: 20160705

## 2017-06-18 ENCOUNTER — Ambulatory Visit (INDEPENDENT_AMBULATORY_CARE_PROVIDER_SITE_OTHER): Payer: Medicare PPO | Admitting: *Deleted

## 2017-06-18 DIAGNOSIS — I639 Cerebral infarction, unspecified: Secondary | ICD-10-CM | POA: Diagnosis not present

## 2017-06-19 NOTE — Progress Notes (Signed)
Carelink Summary Report / Loop Recorder 

## 2017-07-03 ENCOUNTER — Telehealth: Payer: Self-pay | Admitting: Cardiology

## 2017-07-03 NOTE — Telephone Encounter (Signed)
Spoke w/ pt and requested that she send a manual transmission b/c her home monitor has not updated in at least 14 days.   

## 2017-07-23 ENCOUNTER — Ambulatory Visit (INDEPENDENT_AMBULATORY_CARE_PROVIDER_SITE_OTHER): Payer: Medicare PPO | Admitting: *Deleted

## 2017-07-23 DIAGNOSIS — I639 Cerebral infarction, unspecified: Secondary | ICD-10-CM

## 2017-07-23 NOTE — Progress Notes (Signed)
Carelink Summary Report / Loop Recorder 

## 2017-07-25 LAB — CUP PACEART REMOTE DEVICE CHECK
Date Time Interrogation Session: 20190603200722
MDC IDC PG IMPLANT DT: 20160705

## 2017-08-23 ENCOUNTER — Ambulatory Visit (INDEPENDENT_AMBULATORY_CARE_PROVIDER_SITE_OTHER): Payer: Medicare PPO | Admitting: *Deleted

## 2017-08-23 DIAGNOSIS — I639 Cerebral infarction, unspecified: Secondary | ICD-10-CM

## 2017-08-24 NOTE — Progress Notes (Signed)
Carelink Summary Report / Loop Recorder 

## 2017-08-25 LAB — CUP PACEART REMOTE DEVICE CHECK
Date Time Interrogation Session: 20190706200723
Implantable Pulse Generator Implant Date: 20160705

## 2017-09-25 ENCOUNTER — Ambulatory Visit (INDEPENDENT_AMBULATORY_CARE_PROVIDER_SITE_OTHER): Payer: Medicare PPO | Admitting: *Deleted

## 2017-09-25 DIAGNOSIS — I639 Cerebral infarction, unspecified: Secondary | ICD-10-CM | POA: Diagnosis not present

## 2017-09-26 NOTE — Progress Notes (Signed)
Carelink Summary Report / Loop Recorder 

## 2017-10-03 LAB — CUP PACEART REMOTE DEVICE CHECK
Date Time Interrogation Session: 20190808200940
MDC IDC PG IMPLANT DT: 20160705

## 2017-10-12 LAB — CUP PACEART REMOTE DEVICE CHECK
Date Time Interrogation Session: 20190910204026
Implantable Pulse Generator Implant Date: 20160705

## 2017-10-29 ENCOUNTER — Telehealth: Payer: Self-pay | Admitting: Cardiology

## 2017-10-29 ENCOUNTER — Ambulatory Visit (INDEPENDENT_AMBULATORY_CARE_PROVIDER_SITE_OTHER): Payer: Medicare PPO | Admitting: *Deleted

## 2017-10-29 DIAGNOSIS — I639 Cerebral infarction, unspecified: Secondary | ICD-10-CM | POA: Diagnosis not present

## 2017-10-29 NOTE — Telephone Encounter (Signed)
LMOVM requesting that pt send manual transmission b/c home monitor has not updated in at least 14 days.    

## 2017-10-30 NOTE — Progress Notes (Signed)
Carelink Summary Report / Loop Recorder 

## 2017-11-12 ENCOUNTER — Encounter: Payer: Self-pay | Admitting: Cardiology

## 2017-11-12 LAB — CUP PACEART REMOTE DEVICE CHECK
Date Time Interrogation Session: 20191013203938
Implantable Pulse Generator Implant Date: 20160705

## 2017-11-30 ENCOUNTER — Ambulatory Visit (INDEPENDENT_AMBULATORY_CARE_PROVIDER_SITE_OTHER): Payer: Medicare PPO | Admitting: *Deleted

## 2017-11-30 ENCOUNTER — Telehealth: Payer: Self-pay | Admitting: *Deleted

## 2017-11-30 DIAGNOSIS — I639 Cerebral infarction, unspecified: Secondary | ICD-10-CM | POA: Diagnosis not present

## 2017-11-30 NOTE — Telephone Encounter (Signed)
LVMOM regarding LINQ at RRT since 11/07/2017. [facility] phone number to call back.

## 2017-11-30 NOTE — Progress Notes (Signed)
Carelink Summary Report / Loop Recorder 

## 2017-12-03 NOTE — Telephone Encounter (Signed)
Spoke w/ pt and informed her of her loop recorder reaching RRT. She stated that she wants it removed. Informed her a scheduler will be in touch to schedule an appt w/ MD to discuss the removal and we will order her a return kit for her monitor. Pt verbalized understanding.

## 2018-01-14 ENCOUNTER — Other Ambulatory Visit: Payer: Self-pay | Admitting: Internal Medicine

## 2018-01-20 LAB — CUP PACEART REMOTE DEVICE CHECK
Date Time Interrogation Session: 20191115204033
MDC IDC PG IMPLANT DT: 20160705

## 2018-02-13 ENCOUNTER — Other Ambulatory Visit: Payer: Self-pay

## 2018-02-13 ENCOUNTER — Emergency Department (HOSPITAL_COMMUNITY): Payer: Medicare HMO

## 2018-02-13 ENCOUNTER — Encounter (HOSPITAL_COMMUNITY): Payer: Self-pay | Admitting: Emergency Medicine

## 2018-02-13 ENCOUNTER — Inpatient Hospital Stay (HOSPITAL_COMMUNITY): Payer: Medicare HMO

## 2018-02-13 ENCOUNTER — Inpatient Hospital Stay (HOSPITAL_COMMUNITY)
Admission: EM | Admit: 2018-02-13 | Discharge: 2018-02-15 | DRG: 291 | Disposition: A | Discharge status: Home Health | Payer: Medicare HMO | Attending: Internal Medicine | Admitting: Internal Medicine

## 2018-02-13 DIAGNOSIS — I083 Combined rheumatic disorders of mitral, aortic and tricuspid valves: Secondary | ICD-10-CM | POA: Diagnosis present

## 2018-02-13 DIAGNOSIS — N189 Chronic kidney disease, unspecified: Secondary | ICD-10-CM | POA: Diagnosis not present

## 2018-02-13 DIAGNOSIS — Z95818 Presence of other cardiac implants and grafts: Secondary | ICD-10-CM

## 2018-02-13 DIAGNOSIS — J9601 Acute respiratory failure with hypoxia: Secondary | ICD-10-CM | POA: Diagnosis present

## 2018-02-13 DIAGNOSIS — Z79890 Hormone replacement therapy (postmenopausal): Secondary | ICD-10-CM | POA: Diagnosis not present

## 2018-02-13 DIAGNOSIS — D638 Anemia in other chronic diseases classified elsewhere: Secondary | ICD-10-CM

## 2018-02-13 DIAGNOSIS — I480 Paroxysmal atrial fibrillation: Secondary | ICD-10-CM | POA: Diagnosis present

## 2018-02-13 DIAGNOSIS — N179 Acute kidney failure, unspecified: Secondary | ICD-10-CM | POA: Diagnosis present

## 2018-02-13 DIAGNOSIS — E1122 Type 2 diabetes mellitus with diabetic chronic kidney disease: Secondary | ICD-10-CM | POA: Diagnosis present

## 2018-02-13 DIAGNOSIS — I340 Nonrheumatic mitral (valve) insufficiency: Secondary | ICD-10-CM | POA: Diagnosis not present

## 2018-02-13 DIAGNOSIS — E1165 Type 2 diabetes mellitus with hyperglycemia: Secondary | ICD-10-CM | POA: Diagnosis present

## 2018-02-13 DIAGNOSIS — I509 Heart failure, unspecified: Secondary | ICD-10-CM

## 2018-02-13 DIAGNOSIS — E785 Hyperlipidemia, unspecified: Secondary | ICD-10-CM | POA: Diagnosis present

## 2018-02-13 DIAGNOSIS — Z8673 Personal history of transient ischemic attack (TIA), and cerebral infarction without residual deficits: Secondary | ICD-10-CM | POA: Diagnosis not present

## 2018-02-13 DIAGNOSIS — Z90710 Acquired absence of both cervix and uterus: Secondary | ICD-10-CM | POA: Diagnosis not present

## 2018-02-13 DIAGNOSIS — I130 Hypertensive heart and chronic kidney disease with heart failure and stage 1 through stage 4 chronic kidney disease, or unspecified chronic kidney disease: Principal | ICD-10-CM | POA: Diagnosis present

## 2018-02-13 DIAGNOSIS — D631 Anemia in chronic kidney disease: Secondary | ICD-10-CM | POA: Diagnosis present

## 2018-02-13 DIAGNOSIS — N183 Chronic kidney disease, stage 3 (moderate): Secondary | ICD-10-CM

## 2018-02-13 DIAGNOSIS — I5033 Acute on chronic diastolic (congestive) heart failure: Secondary | ICD-10-CM

## 2018-02-13 DIAGNOSIS — R590 Localized enlarged lymph nodes: Secondary | ICD-10-CM | POA: Diagnosis present

## 2018-02-13 DIAGNOSIS — Z880 Allergy status to penicillin: Secondary | ICD-10-CM

## 2018-02-13 DIAGNOSIS — I2721 Secondary pulmonary arterial hypertension: Secondary | ICD-10-CM | POA: Diagnosis present

## 2018-02-13 DIAGNOSIS — D649 Anemia, unspecified: Secondary | ICD-10-CM

## 2018-02-13 DIAGNOSIS — I361 Nonrheumatic tricuspid (valve) insufficiency: Secondary | ICD-10-CM | POA: Diagnosis not present

## 2018-02-13 DIAGNOSIS — R0602 Shortness of breath: Secondary | ICD-10-CM | POA: Diagnosis present

## 2018-02-13 DIAGNOSIS — Z79899 Other long term (current) drug therapy: Secondary | ICD-10-CM

## 2018-02-13 DIAGNOSIS — Z823 Family history of stroke: Secondary | ICD-10-CM

## 2018-02-13 DIAGNOSIS — E039 Hypothyroidism, unspecified: Secondary | ICD-10-CM | POA: Diagnosis present

## 2018-02-13 DIAGNOSIS — J181 Lobar pneumonia, unspecified organism: Secondary | ICD-10-CM

## 2018-02-13 DIAGNOSIS — Z7901 Long term (current) use of anticoagulants: Secondary | ICD-10-CM

## 2018-02-13 DIAGNOSIS — Z794 Long term (current) use of insulin: Secondary | ICD-10-CM

## 2018-02-13 DIAGNOSIS — I10 Essential (primary) hypertension: Secondary | ICD-10-CM

## 2018-02-13 DIAGNOSIS — I4581 Long QT syndrome: Secondary | ICD-10-CM | POA: Diagnosis present

## 2018-02-13 DIAGNOSIS — J189 Pneumonia, unspecified organism: Secondary | ICD-10-CM | POA: Diagnosis present

## 2018-02-13 DIAGNOSIS — I5032 Chronic diastolic (congestive) heart failure: Secondary | ICD-10-CM | POA: Diagnosis present

## 2018-02-13 DIAGNOSIS — E782 Mixed hyperlipidemia: Secondary | ICD-10-CM | POA: Diagnosis not present

## 2018-02-13 HISTORY — DX: Pneumonia, unspecified organism: J18.9

## 2018-02-13 LAB — BASIC METABOLIC PANEL
ANION GAP: 9 (ref 5–15)
BUN: 57 mg/dL — ABNORMAL HIGH (ref 8–23)
CALCIUM: 8.6 mg/dL — AB (ref 8.9–10.3)
CO2: 18 mmol/L — ABNORMAL LOW (ref 22–32)
CREATININE: 2.19 mg/dL — AB (ref 0.44–1.00)
Chloride: 108 mmol/L (ref 98–111)
GFR, EST AFRICAN AMERICAN: 24 mL/min — AB (ref >60)
GFR, EST NON AFRICAN AMERICAN: 21 mL/min — AB (ref >60)
Glucose, Bld: 423 mg/dL — ABNORMAL HIGH (ref 70–99)
Potassium: 4.4 mmol/L (ref 3.5–5.1)
SODIUM: 135 mmol/L (ref 135–145)

## 2018-02-13 LAB — CBC WITH DIFFERENTIAL/PLATELET
Abs Immature Granulocytes: 0.09 10*3/uL — ABNORMAL HIGH (ref 0.00–0.07)
BASOS ABS: 0.1 10*3/uL (ref 0.0–0.1)
Basophils Relative: 0 %
EOS ABS: 0.1 10*3/uL (ref 0.0–0.5)
EOS PCT: 1 %
HEMATOCRIT: 24.6 % — AB (ref 36.0–46.0)
Hemoglobin: 7 g/dL — ABNORMAL LOW (ref 12.0–15.0)
IMMATURE GRANULOCYTES: 1 %
LYMPHS ABS: 1.1 10*3/uL (ref 0.7–4.0)
LYMPHS PCT: 8 %
MCH: 27.5 pg (ref 26.0–34.0)
MCHC: 28.5 g/dL — AB (ref 30.0–36.0)
MCV: 96.5 fL (ref 80.0–100.0)
Monocytes Absolute: 0.8 10*3/uL (ref 0.1–1.0)
Monocytes Relative: 6 %
NEUTROS PCT: 84 %
NRBC: 0.3 % — AB (ref 0.0–0.2)
Neutro Abs: 11.8 10*3/uL — ABNORMAL HIGH (ref 1.7–7.7)
Platelets: 410 10*3/uL — ABNORMAL HIGH (ref 150–400)
RBC: 2.55 MIL/uL — AB (ref 3.87–5.11)
RDW: 19 % — AB (ref 11.5–15.5)
WBC: 13.9 10*3/uL — AB (ref 4.0–10.5)

## 2018-02-13 LAB — PREPARE RBC (CROSSMATCH)

## 2018-02-13 LAB — GLUCOSE, CAPILLARY
GLUCOSE-CAPILLARY: 319 mg/dL — AB (ref 70–99)
Glucose-Capillary: 149 mg/dL — ABNORMAL HIGH (ref 70–99)

## 2018-02-13 LAB — TROPONIN I
Troponin I: 0.13 ng/mL (ref <0.03)
Troponin I: 0.14 ng/mL (ref <0.03)

## 2018-02-13 LAB — HEMOGLOBIN A1C
Hgb A1c MFr Bld: 7.4 % — ABNORMAL HIGH (ref 4.8–5.6)
Mean Plasma Glucose: 165.68 mg/dL

## 2018-02-13 LAB — BRAIN NATRIURETIC PEPTIDE: B Natriuretic Peptide: 688 pg/mL — ABNORMAL HIGH (ref 0.0–100.0)

## 2018-02-13 LAB — VITAMIN B12: Vitamin B-12: 696 pg/mL (ref 180–914)

## 2018-02-13 LAB — URINALYSIS, COMPLETE (UACMP) WITH MICROSCOPIC
Bacteria, UA: NONE SEEN
Bilirubin Urine: NEGATIVE
Glucose, UA: NEGATIVE mg/dL
Hgb urine dipstick: NEGATIVE
Ketones, ur: NEGATIVE mg/dL
Nitrite: NEGATIVE
Protein, ur: 30 mg/dL — AB
SPECIFIC GRAVITY, URINE: 1.006 (ref 1.005–1.030)
pH: 7 (ref 5.0–8.0)

## 2018-02-13 LAB — IRON AND TIBC
Iron: 25 ug/dL — ABNORMAL LOW (ref 28–170)
Saturation Ratios: 9 % — ABNORMAL LOW (ref 10.4–31.8)
TIBC: 278 ug/dL (ref 250–450)
UIBC: 253 ug/dL

## 2018-02-13 LAB — FOLATE: Folate: 9.8 ng/mL (ref >5.9)

## 2018-02-13 LAB — ABO/RH: ABO/RH(D): A POS

## 2018-02-13 LAB — PROCALCITONIN: Procalcitonin: 0.11 ng/mL

## 2018-02-13 LAB — FERRITIN: Ferritin: 54 ng/mL (ref 11–307)

## 2018-02-13 LAB — POC OCCULT BLOOD, ED: Fecal Occult Bld: POSITIVE — AB

## 2018-02-13 MED ORDER — INSULIN ASPART 100 UNIT/ML ~~LOC~~ SOLN
0.0000 [IU] | Freq: Every day | SUBCUTANEOUS | Status: DC
  Administered 2018-02-14: 2 [IU] via SUBCUTANEOUS

## 2018-02-13 MED ORDER — ACETAMINOPHEN 325 MG PO TABS
650.0000 mg | ORAL_TABLET | ORAL | Status: DC | PRN
  Administered 2018-02-13: 650 mg via ORAL
  Filled 2018-02-13: qty 2

## 2018-02-13 MED ORDER — SODIUM BICARBONATE 650 MG PO TABS
1300.0000 mg | ORAL_TABLET | Freq: Two times a day (BID) | ORAL | Status: DC
  Administered 2018-02-13 – 2018-02-15 (×4): 1300 mg via ORAL
  Filled 2018-02-13 (×4): qty 2

## 2018-02-13 MED ORDER — FUROSEMIDE 10 MG/ML IJ SOLN
40.0000 mg | Freq: Once | INTRAMUSCULAR | Status: AC
  Administered 2018-02-13: 40 mg via INTRAVENOUS

## 2018-02-13 MED ORDER — ISOSORBIDE DINITRATE 20 MG PO TABS
30.0000 mg | ORAL_TABLET | Freq: Three times a day (TID) | ORAL | Status: DC
  Administered 2018-02-13 – 2018-02-15 (×5): 30 mg via ORAL
  Filled 2018-02-13 (×5): qty 2

## 2018-02-13 MED ORDER — SODIUM CHLORIDE 0.9% FLUSH: 3.0000 mL | INTRAVENOUS | Status: DC | PRN

## 2018-02-13 MED ORDER — INSULIN ASPART 100 UNIT/ML ~~LOC~~ SOLN
0.0000 [IU] | Freq: Three times a day (TID) | SUBCUTANEOUS | Status: DC
  Administered 2018-02-13: 11 [IU] via SUBCUTANEOUS
  Administered 2018-02-14: 3 [IU] via SUBCUTANEOUS
  Administered 2018-02-14: 2 [IU] via SUBCUTANEOUS
  Administered 2018-02-14: 5 [IU] via SUBCUTANEOUS
  Administered 2018-02-15: 3 [IU] via SUBCUTANEOUS

## 2018-02-13 MED ORDER — ONDANSETRON HCL 4 MG/2ML IJ SOLN: 4.0000 mg | Freq: Four times a day (QID) | INTRAMUSCULAR | Status: DC | PRN

## 2018-02-13 MED ORDER — NITROGLYCERIN 2 % TD OINT
1.0000 [in_us] | TOPICAL_OINTMENT | Freq: Four times a day (QID) | TRANSDERMAL | Status: DC
  Administered 2018-02-13: 1 [in_us] via TOPICAL
  Filled 2018-02-13: qty 1

## 2018-02-13 MED ORDER — PANTOPRAZOLE SODIUM 40 MG PO TBEC
40.0000 mg | DELAYED_RELEASE_TABLET | Freq: Every day | ORAL | Status: DC
  Administered 2018-02-13 – 2018-02-15 (×3): 40 mg via ORAL
  Filled 2018-02-13 (×3): qty 1

## 2018-02-13 MED ORDER — HYDRALAZINE HCL 25 MG PO TABS
100.0000 mg | ORAL_TABLET | Freq: Three times a day (TID) | ORAL | Status: DC
  Administered 2018-02-13 – 2018-02-15 (×6): 100 mg via ORAL
  Filled 2018-02-13 (×6): qty 4

## 2018-02-13 MED ORDER — ATORVASTATIN CALCIUM 40 MG PO TABS
80.0000 mg | ORAL_TABLET | Freq: Every evening | ORAL | Status: DC
  Administered 2018-02-13 – 2018-02-14 (×2): 80 mg via ORAL
  Filled 2018-02-13 (×2): qty 2

## 2018-02-13 MED ORDER — SODIUM CHLORIDE 0.9% IV SOLUTION
Freq: Once | INTRAVENOUS | Status: AC
  Administered 2018-02-13: 17:00:00 via INTRAVENOUS

## 2018-02-13 MED ORDER — ESCITALOPRAM OXALATE 10 MG PO TABS
10.0000 mg | ORAL_TABLET | Freq: Every day | ORAL | Status: DC
  Administered 2018-02-13 – 2018-02-15 (×3): 10 mg via ORAL
  Filled 2018-02-13 (×3): qty 1

## 2018-02-13 MED ORDER — APIXABAN 5 MG PO TABS
5.0000 mg | ORAL_TABLET | Freq: Two times a day (BID) | ORAL | Status: DC
  Administered 2018-02-13 – 2018-02-15 (×4): 5 mg via ORAL
  Filled 2018-02-13 (×4): qty 1

## 2018-02-13 MED ORDER — SODIUM CHLORIDE 0.9% FLUSH
3.0000 mL | Freq: Two times a day (BID) | INTRAVENOUS | Status: DC
  Administered 2018-02-13 – 2018-02-15 (×4): 3 mL via INTRAVENOUS

## 2018-02-13 MED ORDER — CALCIUM ACETATE (PHOS BINDER) 667 MG PO CAPS
667.0000 mg | ORAL_CAPSULE | ORAL | Status: DC
  Administered 2018-02-14: 667 mg via ORAL
  Filled 2018-02-13: qty 1

## 2018-02-13 MED ORDER — FUROSEMIDE 10 MG/ML IJ SOLN
40.0000 mg | Freq: Two times a day (BID) | INTRAMUSCULAR | Status: DC
  Administered 2018-02-14: 40 mg via INTRAVENOUS
  Filled 2018-02-13 (×2): qty 4

## 2018-02-13 MED ORDER — FERROUS SULFATE 325 (65 FE) MG PO TABS
325.0000 mg | ORAL_TABLET | Freq: Two times a day (BID) | ORAL | Status: DC
  Administered 2018-02-13 – 2018-02-15 (×4): 325 mg via ORAL
  Filled 2018-02-13 (×4): qty 1

## 2018-02-13 MED ORDER — LEVOTHYROXINE SODIUM 100 MCG PO TABS
100.0000 ug | ORAL_TABLET | Freq: Every day | ORAL | Status: DC
  Administered 2018-02-14 – 2018-02-15 (×2): 100 ug via ORAL
  Filled 2018-02-13 (×2): qty 1

## 2018-02-13 MED ORDER — FUROSEMIDE 10 MG/ML IJ SOLN
40.0000 mg | Freq: Once | INTRAMUSCULAR | Status: AC
  Administered 2018-02-13: 40 mg via INTRAVENOUS
  Filled 2018-02-13: qty 4

## 2018-02-13 MED ORDER — SODIUM CHLORIDE 0.9 % IV SOLN: 250.0000 mL | INTRAVENOUS | Status: DC | PRN

## 2018-02-13 NOTE — ED Triage Notes (Signed)
Patient states she was diagnosed with pneumonia 2 weeks ago at [facility] and is still having cough and weakness. States she did not follow up with PCP as directed.

## 2018-02-13 NOTE — ED Provider Notes (Signed)
[facility] EMERGENCY DEPARTMENT Provider Note   CSN: [id number] Arrival date & time: 02/13/18  0920     History   Chief Complaint Chief Complaint  Patient presents with  . Cough    HPI Beth Padilla is a 78 y.o. female.  HPI Patient presents to the emergency room for evaluation of shortness of breath.  Patient states she started having symptoms a few weeks ago.  She felt like she was having a cold.  She tried taking some over-the-counter medications.  Her [facility] persisted [facility]  She was seen 2 weeks ago and had a chest x-ray.  Patient was told she might have a pneumonia.  She was started on antibiotics.  She was scheduled to see her pulmonary doctor today in follow-up.  Patient states she got very short of breath when trying to walk short distances.  Patient had difficulty getting out to her car.  She had to stop a couple of times.  Family members brought her into the ED.  Patient denies any fevers or chills.  No chest pain.  She has not noticed any leg swelling. Past Medical History:  Diagnosis Date  . Anemia   . Atrial fibrillation ([place])   . Depression   . Diabetes mellitus   . Hyperlipidemia 12/15/2010  . Hypertension   . Hypothyroidism 12/15/2010  . Pneumonia   . Renal disorder     Patient Active Problem List   Diagnosis Date Noted  . Paroxysmal atrial fibrillation ([place]) 02/19/2015  . CVA (cerebral infarction)   . Elevated troponin   . CKD (chronic kidney disease), stage III ([place]) 07/19/2014  . Stroke ([place]) 07/18/2014  . Acute renal failure superimposed on stage 3 chronic kidney disease ([place]) 09/16/2013  . CHF (congestive heart failure) ([place]) 09/14/2013  . Bradycardia 09/14/2013  . UTI (urinary tract infection) 09/14/2013  . DM type 2 (diabetes mellitus, type 2) ([place]) 12/15/2010  . HTN (hypertension) 12/15/2010  . Anemia 12/15/2010  . Hypothyroidism 12/15/2010  . Hyperlipidemia 12/15/2010    Past Surgical History:  Procedure  Laterality Date  . ABDOMINAL HYSTERECTOMY    . EP IMPLANTABLE DEVICE N/A 07/21/2014   Procedure: Loop Recorder Insertion;  Surgeon: [name], MD;  Location: [place] [facility];  Service: Cardiovascular;  Laterality: N/A;  . TEE WITHOUT CARDIOVERSION N/A 07/21/2014   Procedure: TRANSESOPHAGEAL ECHOCARDIOGRAM (TEE);  Surgeon: [name], MD;  Location: [facility] ENDOSCOPY;  Service: Cardiovascular;  Laterality: N/A;     OB History   No obstetric history on file.      Home Medications    Prior to Admission medications   Medication Sig Start Date End Date Taking? Authorizing Provider  acetaminophen (TYLENOL) 650 MG CR tablet Take 650 mg by mouth every 8 (eight) hours as needed for pain.   Yes [provider]  amLODipine (NORVASC) 5 MG tablet Take 5 mg by mouth daily. 10/02/14  Yes [provider]  apixaban (ELIQUIS) 5 MG TABS tablet Take 5 mg by mouth 2 (two) times daily. Started by Dr.Zagol in [place], [place]   Yes [provider]  atorvastatin (LIPITOR) 80 MG tablet Take 1 tablet (80 mg total) by mouth every evening. 07/22/14  Yes [name], MD  calcium acetate (PHOSLO) 667 MG capsule Take 1 capsule by mouth 2 (two) times a week. Mondays and tuursdays 11/29/17  Yes [provider]  chlorthalidone (HYGROTON) 50 MG tablet  10/30/15  Yes [provider]  escitalopram (LEXAPRO)  10 MG tablet Take 10 mg by mouth daily.   Yes [provider]  ferrous sulfate 325 (65 FE) MG tablet Take 1 tablet by mouth 2 (two) times daily. 11/09/17  Yes [provider]  hydrALAZINE (APRESOLINE) 100 MG tablet Take 100 mg by mouth 3 (three) times daily.   Yes [provider]  isosorbide dinitrate (ISORDIL) 30 MG tablet Take 30 mg by mouth 3 (three) times daily.  09/18/15  Yes [provider]  levothyroxine (SYNTHROID, LEVOTHROID) 100 MCG tablet Take 100 mcg by mouth daily before breakfast.   Yes [provider]  NIFEdipine  (PROCARDIA-XL/ADALAT CC) 30 MG 24 hr tablet Take 30 mg by mouth 2 (two) times daily.   Yes [provider]  omeprazole (PRILOSEC) 20 MG capsule Take 20 mg by mouth daily.   Yes [provider]  sodium bicarbonate 650 MG tablet Take 1,300 mg by mouth 2 (two) times daily.   Yes [provider]  Vitamins A & D (VITAMIN A & D) ointment Apply 1 application topically as needed. 11/09/17   [provider]    Family History Family History  Problem Relation Age of Onset  . Stroke Mother   . Cancer Sister     Social History Social History   Tobacco Use  . Smoking status: Never Smoker  . Smokeless tobacco: Never Used  Substance Use Topics  . Alcohol use: No  . Drug use: No     Allergies   Ampicillin   Review of Systems Review of Systems  Constitutional: Negative for fever.  Cardiovascular: Negative for chest pain.  Gastrointestinal: Negative for blood in stool.  All other systems reviewed and are negative.    Physical Exam Updated Vital Signs BP (!) 167/69   Pulse 61   Temp 98 F (36.7 C) (Oral)   Resp (!) 22   Ht 1.702 m (5\' 7" )   Wt 82.6 kg   SpO2 95%   BMI 28.51 kg/m   Physical Exam Vitals signs and nursing note reviewed.  Constitutional:      General: She is not in acute distress.    Appearance: She is well-developed.  HENT:     Head: Normocephalic and atraumatic.     Right Ear: External ear normal.     Left Ear: External ear normal.  Eyes:     General: No scleral icterus.       Right eye: No discharge.        Left eye: No discharge.     Conjunctiva/sclera: Conjunctivae normal.  Neck:     Musculoskeletal: Neck supple.     Trachea: No tracheal deviation.  Cardiovascular:     Rate and Rhythm: Normal rate and regular rhythm.  Pulmonary:     Effort: Pulmonary effort is normal. No respiratory distress.     Breath sounds: No stridor. Rales present. No wheezing.  Abdominal:     General: Bowel sounds are normal. There is  no distension.     Palpations: Abdomen is soft.     Tenderness: There is no abdominal tenderness. There is no guarding or rebound.  Musculoskeletal:        General: No tenderness.     Right lower leg: No edema.     Left lower leg: No edema.  Skin:    General: Skin is warm and dry.     Findings: No rash.  Neurological:     Mental Status: She is alert.     Cranial Nerves: No  cranial nerve deficit (no facial droop, extraocular movements intact, no slurred speech).     Sensory: No sensory deficit.     Motor: No abnormal muscle tone or seizure activity.     Coordination: Coordination normal.      ED Treatments / Results  Labs (all labs ordered are listed, but only abnormal results are displayed) Labs Reviewed  CBC WITH DIFFERENTIAL/PLATELET - Abnormal; Notable for the following components:      Result Value   WBC 13.9 (*)    RBC 2.55 (*)    Hemoglobin 7.0 (*)    HCT 24.6 (*)    MCHC 28.5 (*)    RDW 19.0 (*)    Platelets 410 (*)    nRBC 0.3 (*)    Neutro Abs 11.8 (*)    Abs Immature Granulocytes 0.09 (*)    All other components within normal limits  BASIC METABOLIC PANEL - Abnormal; Notable for the following components:   CO2 18 (*)    Glucose, Bld 423 (*)    BUN 57 (*)    Creatinine, Ser 2.19 (*)    Calcium 8.6 (*)    GFR calc non Af Amer 21 (*)    GFR calc Af Amer 24 (*)    All other components within normal limits  BRAIN NATRIURETIC PEPTIDE - Abnormal; Notable for the following components:   B Natriuretic Peptide 688.0 (*)    All other components within normal limits  TROPONIN I - Abnormal; Notable for the following components:   Troponin I 0.13 (*)    All other components within normal limits  POC OCCULT BLOOD, ED    EKG EKG Interpretation  Date/Time:  Wednesday February 13 2018 12:43:16 EST Ventricular Rate:  75 PR Interval:    QRS Duration: 77 QT Interval:  490 QTC Calculation: 548 R Axis:   101 Text Interpretation:  Atrial fibrillation Ventricular  premature complex Right axis deviation Consider left ventricular hypertrophy Nonspecific T abnrm, anterolateral leads Prolonged QT interval No significant change since last tracing Confirmed by Dorie Rank [phone]) on 02/13/2018 1:06:39 PM   Radiology Dg Chest 2 View  Result Date: 02/13/2018 CLINICAL DATA:  Cough. EXAM: CHEST - 2 VIEW COMPARISON:  07/19/2014. FINDINGS: Cardiac monitor device noted over the chest. Cardiomegaly with pulmonary venous prominence, bilateral diffuse interstitial prominence, and right-sided pleural effusion. Findings consistent with CHF. Interstitial prominence secondary to other etiologies including pneumonitis can not be excluded. No pneumothorax. IMPRESSION: Cardiomegaly with pulmonary venous congestion, diffuse bilateral interstitial prominence, and right-sided pleural effusion. Findings most consistent CHF. Pneumonitis can not be excluded. Electronically Signed   By: Marcello Moores  Register   On: 02/13/2018 10:33    Procedures Procedures (including critical care time)  Medications Ordered in ED Medications  furosemide (LASIX) injection 40 mg (has no administration in time range)  nitroGLYCERIN (NITROGLYN) 2 % ointment 1 inch (has no administration in time range)     Initial Impression / Assessment and Plan / ED Course  I have reviewed the triage vital signs and the nursing notes.  Pertinent labs & imaging results that were available during my care of the patient were reviewed by me and considered in my medical decision making (see chart for details).   Patient presented to the emergency room for evaluation of shortness of breath.  Patient thought she had a URI a couple weeks ago.  She was evaluated and put on antibiotics for possible pneumonia.  Patient states her symptoms have not gotten much better and today she  was having dyspnea on exertion.  Patient does have a history of congestive heart failure.  Chest x-ray is consistent with pulmonary edema.  Her laboratory  tests are notable for hyperglycemia as well as chronic kidney disease.  Patient's troponin also elevated all this appears to be chronic.  Her hemoglobin is also lower than previous values.  Think the patient would benefit from diuresis and close observation.  I will consult with the medical service for admission Final Clinical Impressions(s) / ED Diagnoses   Final diagnoses:  Acute on chronic congestive heart failure, unspecified heart failure type ([place])  Chronic kidney disease, unspecified CKD stage  Anemia, unspecified type      Dorie Rank, MD 02/13/18 1308

## 2018-02-13 NOTE — H&P (Signed)
History and Physical  Beth Padilla OAC:[id number] DOB: [birth year] DOA: 02/13/2018   PCP: Kendrick Ranch, MD   Patient coming from: Home  Chief Complaint:dyspnea  HPI:  Beth Padilla is a 78 y.o. female with medical history of paroxysmal atrial fibrillation, cryptogenic stroke, diabetes mellitus type 2, hypertension, hyperlipidemia, CKD stage III presenting with 2 to 3-week history of shortness of breath.  The patient went to urgent care on 01/27/2018.  The patient had a chest x-ray done and was diagnosed with pneumonia.  She was discharged with doxycycline for 10 days.  She stated she took all 10 days without much improvement.  Her family has noticed increasing dyspnea on exertion during the past week.  As result, the patient was brought to emergency department for further evaluation.  The patient denies any fevers, chills, chest pain, nausea vomiting, diarrhea, abdominal pain, dysuria, hematuria.  She denies any hemoptysis but has a nonproductive cough.  She endorses compliance with all her medications.  She denies any worsening lower extremity edema or orthopnea type symptoms.  There is been no hematochezia or melena but she complains of "dark" stools. In the emergency department, the patient was afebrile hemodynamically stable originally saturating 90% on room air.  The patient was placed on supplemental 2 L with oxygen saturation of 95%.  BMP showed a serum glucose of 423 with serum creatinine 2.18.  WBC was 13.9.  Hemoglobin was 7.0.  BNP was 688.0.  Chest x-ray showed pulmonary venous congestion with interstitial prominence.  Patient was started on intravenous furosemide 40 mg IV x1.  Assessment/Plan: Acute respiratory failure with hypoxia -Secondary to CHF -Wean oxygen back to room air as tolerated for saturation greater 92% -CT of the chest to clarify right pleural effusion versus infiltrate/consolidation  Acute on chronic diastolic CHF -0/01/930 echo EF 65-70%,  no WMA, mild MR/left ear/AI, PASP 52 -Start IV furosemide 40 mg twice daily -Echocardiogram -Daily weights -Accurate I's and O's  Symptomatic anemia -Patient's hemoglobin is gradually drifted down to 7.0 -Likely secondary to CKD -Baseline hemoglobin 8-9 -Transfuse 2 units PRBC with 40 mg of Lasix between units -FOBT -Check anemia panel  Paroxysmal atrial fibrillation -Rate controlled -Continue apixaban -CHADSVASc = 6  CKD stage III--acute on chronic renal failure -Baseline creatinine 1.4-1.6 -Presenting creatinine 2.18 -May simply represent progression of chronic renal disease -Continue PhosLo -Continue bicarbonate  Uncontrolled diabetes mellitus type 2 with hyperglycemia -Check hemoglobin A1c -NovoLog sliding scale -Presented with serum glucose 423, anion gap 9  Elevated troponin -Secondary to CHF and acute on chronic renal failure -No chest pain presently -EKG without concerning ischemic changes  Essential hypertension -Continue hydralazine, Isordil -Holding amlodipine pending echocardiogram -Anticipate improvement with diuresis -Discontinue chlorthalidone  Hyperlipidemia -Continue Lipitor  Hypothyroidism -Continue Synthroid       Past Medical History:  Diagnosis Date  . Anemia   . Atrial fibrillation ([place])   . Depression   . Diabetes mellitus   . Hyperlipidemia 12/15/2010  . Hypertension   . Hypothyroidism 12/15/2010  . Pneumonia   . Renal disorder    Past Surgical History:  Procedure Laterality Date  . ABDOMINAL HYSTERECTOMY    . EP IMPLANTABLE DEVICE N/A 07/21/2014   Procedure: Loop Recorder Insertion;  Surgeon: [name], MD;  Location: [place] [facility];  Service: Cardiovascular;  Laterality: N/A;  . TEE WITHOUT CARDIOVERSION N/A 07/21/2014   Procedure: TRANSESOPHAGEAL ECHOCARDIOGRAM (TEE);  Surgeon: [name], MD;  Location: [place];  Service: Cardiovascular;  Laterality: N/A;   Social History:  reports that she has never  smoked. She has never used smokeless tobacco. She reports that she does not drink alcohol or use drugs.   Family History  Problem Relation Age of Onset  . Stroke Mother   . Cancer Sister      Allergies  Allergen Reactions  . Ampicillin Rash     Prior to Admission medications   Medication Sig Start Date End Date Taking? Authorizing Provider  acetaminophen (TYLENOL) 650 MG CR tablet Take 650 mg by mouth every 8 (eight) hours as needed for pain.   Yes [provider]  amLODipine (NORVASC) 5 MG tablet Take 5 mg by mouth daily. 10/02/14  Yes [provider]  apixaban (ELIQUIS) 5 MG TABS tablet Take 5 mg by mouth 2 (two) times daily. Started by Dr.Zagol in [place], [place]   Yes [provider]  atorvastatin (LIPITOR) 80 MG tablet Take 1 tablet (80 mg total) by mouth every evening. 07/22/14  Yes [name], MD  calcium acetate (PHOSLO) 667 MG capsule Take 1 capsule by mouth 2 (two) times a week. Mondays and tuursdays 11/29/17  Yes [provider]  chlorthalidone (HYGROTON) 50 MG tablet  10/30/15  Yes [provider]  escitalopram (LEXAPRO) 10 MG tablet Take 10 mg by mouth daily.   Yes [provider]  ferrous sulfate 325 (65 FE) MG tablet Take 1 tablet by mouth 2 (two) times daily. 11/09/17  Yes [provider]  hydrALAZINE (APRESOLINE) 100 MG tablet Take 100 mg by mouth 3 (three) times daily.   Yes [provider]  isosorbide dinitrate (ISORDIL) 30 MG tablet Take 30 mg by mouth 3 (three) times daily.  09/18/15  Yes [provider]  levothyroxine (SYNTHROID, LEVOTHROID) 100 MCG tablet Take 100 mcg by mouth daily before breakfast.   Yes [provider]  NIFEdipine (PROCARDIA-XL/ADALAT CC) 30 MG 24 hr tablet Take 30 mg by mouth 2 (two) times daily.   Yes [provider]  omeprazole (PRILOSEC) 20 MG capsule Take 20 mg by mouth daily.   Yes [provider]  sodium bicarbonate 650 MG tablet Take  1,300 mg by mouth 2 (two) times daily.   Yes [provider]  Vitamins A & D (VITAMIN A & D) ointment Apply 1 application topically as needed. 11/09/17   [provider]    Review of Systems:  Constitutional:  No weight loss, night sweats, Fevers, chills, fatigue.  Head&Eyes: No headache.  No vision loss.  No eye pain or scotoma ENT:  No Difficulty swallowing,Tooth/dental problems,Sore throat,  No ear ache, post nasal drip,  Cardio-vascular:  No chest pain, Orthopnea, PND, swelling in lower extremities,  dizziness, palpitations  GI:  No  abdominal pain, nausea, vomiting, diarrhea, loss of appetite, hematochezia, melena, heartburn, indigestion, Resp:  No  No coughing up of blood .No wheezing.No chest wall deformity  Skin:  no rash or lesions.  GU:  no dysuria, change in color of urine, no urgency or frequency. No flank pain.  Musculoskeletal:  No joint pain or swelling. No decreased range of motion. No back pain.  Psych:  No change in mood or affect. No depression or anxiety. Neurologic: No headache, no dysesthesia, no focal weakness, no vision loss. No syncope  Physical Exam: Vitals:   02/13/18 1008 02/13/18 1145 02/13/18 1230 02/13/18 1235  BP: (!) 161/52  (!) 167/69   Pulse: 69 (!) 45 61   Resp: (!) 22  Temp: 98 F (36.7 C)     TempSrc: Oral     SpO2: 92% 94% 90% 95%  Weight:      Height:       General:  A&O x 3, NAD, nontoxic, pleasant/cooperative Head/Eye: No conjunctival hemorrhage, no icterus, [place]/AT, No nystagmus ENT:  No icterus,  No thrush, good dentition, no pharyngeal exudate Neck:  No masses, no lymphadenpathy, no bruits CV:  IRRR, no rub, no gallop, no S3 Lung: Bibasilar crackles.  Diminished breath sounds on the left.  No wheezing. Abdomen: soft/NT, +BS, nondistended, no peritoneal signs Ext: No cyanosis, No rashes, No petechiae, No lymphangitis, No edema Neuro: CNII-XII intact, strength 4/5 in bilateral upper and lower extremities,  no dysmetria  Labs on Admission:  Basic Metabolic Panel: Recent Labs  Lab 02/13/18 1142  NA 135  K 4.4  CL 108  CO2 18*  GLUCOSE 423*  BUN 57*  CREATININE 2.19*  CALCIUM 8.6*   Liver Function Tests: No results for input(s): AST, ALT, ALKPHOS, BILITOT, PROT, ALBUMIN in the last 168 hours. No results for input(s): LIPASE, AMYLASE in the last 168 hours. No results for input(s): AMMONIA in the last 168 hours. CBC: Recent Labs  Lab 02/13/18 1142  WBC 13.9*  NEUTROABS 11.8*  HGB 7.0*  HCT 24.6*  MCV 96.5  PLT 410*   Coagulation Profile: No results for input(s): INR, PROTIME in the last 168 hours. Cardiac Enzymes: Recent Labs  Lab 02/13/18 1200  TROPONINI 0.13*   BNP: Invalid input(s): POCBNP CBG: No results for input(s): GLUCAP in the last 168 hours. Urine analysis:    Component Value Date/Time   COLORURINE YELLOW 07/18/2014 2120   APPEARANCEUR CLEAR 07/18/2014 2120   LABSPEC 1.010 07/18/2014 2120   PHURINE 7.0 07/18/2014 2120   GLUCOSEU NEGATIVE 07/18/2014 2120   HGBUR NEGATIVE 07/18/2014 2120   [place] NEGATIVE 07/18/2014 2120   [place] NEGATIVE 07/18/2014 2120   PROTEINUR 30 (A) 07/18/2014 2120   UROBILINOGEN 0.2 07/18/2014 2120   NITRITE NEGATIVE 07/18/2014 2120   LEUKOCYTESUR SMALL (A) 07/18/2014 2120   Sepsis Labs: @LABRCNTIP (procalcitonin:4,lacticidven:4) )No results found for this or any previous visit (from the past 240 hour(s)).   Radiological Exams on Admission: Dg Chest 2 View  Result Date: 02/13/2018 CLINICAL DATA:  Cough. EXAM: CHEST - 2 VIEW COMPARISON:  07/19/2014. FINDINGS: Cardiac monitor device noted over the chest. Cardiomegaly with pulmonary venous prominence, bilateral diffuse interstitial prominence, and right-sided pleural effusion. Findings consistent with CHF. Interstitial prominence secondary to other etiologies including pneumonitis can not be excluded. No pneumothorax. IMPRESSION: Cardiomegaly with pulmonary venous  congestion, diffuse bilateral interstitial prominence, and right-sided pleural effusion. Findings most consistent CHF. Pneumonitis can not be excluded. Electronically Signed   By: Marcello Moores  Register   On: 02/13/2018 10:33    EKG: Independently reviewed. Afib, nonspecifc T wave change    Time spent:60 minutes Code Status:   FULL Family Communication:  Daughter updated at bedside Disposition Plan: expect 2-3 day hospitalization Consults called: none DVT Prophylaxis: apixaban  Orson Eva, DO  [facility] Pager [phone]  If 7PM-7AM, please contact night-coverage [email or web address] Password [email or web address] 02/13/2018, 2:05 PM

## 2018-02-13 NOTE — ED Notes (Signed)
Placed pt on 2 L nasal cannula for low oxygen saturation and pt request.

## 2018-02-14 ENCOUNTER — Inpatient Hospital Stay (HOSPITAL_COMMUNITY): Payer: Medicare HMO

## 2018-02-14 DIAGNOSIS — J181 Lobar pneumonia, unspecified organism: Secondary | ICD-10-CM

## 2018-02-14 DIAGNOSIS — I340 Nonrheumatic mitral (valve) insufficiency: Secondary | ICD-10-CM

## 2018-02-14 DIAGNOSIS — I361 Nonrheumatic tricuspid (valve) insufficiency: Secondary | ICD-10-CM

## 2018-02-14 LAB — URINE CULTURE: Culture: >=100000 — AB

## 2018-02-14 LAB — BPAM RBC
Blood Product Expiration Date: 202002242359
Blood Product Expiration Date: 202002242359
ISSUE DATE / TIME: 202001291834
ISSUE DATE / TIME: 202001292237
Unit Type and Rh: 6200
Unit Type and Rh: 6200

## 2018-02-14 LAB — GLUCOSE, CAPILLARY
Glucose-Capillary: 164 mg/dL — ABNORMAL HIGH (ref 70–99)
Glucose-Capillary: 222 mg/dL — ABNORMAL HIGH (ref 70–99)
Glucose-Capillary: 164 mg/dL — ABNORMAL HIGH (ref 70–99)
Glucose-Capillary: 217 mg/dL — ABNORMAL HIGH (ref 70–99)

## 2018-02-14 LAB — TYPE AND SCREEN
ABO/RH(D): A POS
Antibody Screen: NEGATIVE
Unit division: 0
Unit division: 0

## 2018-02-14 LAB — ECHOCARDIOGRAM COMPLETE
Height: 67 in
Weight: 2850.11 oz

## 2018-02-14 LAB — BASIC METABOLIC PANEL
Anion gap: 8 (ref 5–15)
BUN: 53 mg/dL — ABNORMAL HIGH (ref 8–23)
CO2: 18 mmol/L — ABNORMAL LOW (ref 22–32)
Calcium: 8.2 mg/dL — ABNORMAL LOW (ref 8.9–10.3)
Chloride: 111 mmol/L (ref 98–111)
Creatinine, Ser: 2.1 mg/dL — ABNORMAL HIGH (ref 0.44–1.00)
GFR calc Af Amer: 25 mL/min — ABNORMAL LOW (ref >60)
GFR calc non Af Amer: 22 mL/min — ABNORMAL LOW (ref >60)
Glucose, Bld: 179 mg/dL — ABNORMAL HIGH (ref 70–99)
Potassium: 3.9 mmol/L (ref 3.5–5.1)
SODIUM: 137 mmol/L (ref 135–145)

## 2018-02-14 LAB — HEMOGLOBIN AND HEMATOCRIT, BLOOD
HCT: 28.2 % — ABNORMAL LOW (ref 36.0–46.0)
Hemoglobin: 8.5 g/dL — ABNORMAL LOW (ref 12.0–15.0)

## 2018-02-14 LAB — TROPONIN I
TROPONIN I: 0.13 ng/mL — AB (ref <0.03)
Troponin I: 0.14 ng/mL (ref <0.03)

## 2018-02-14 MED ORDER — SODIUM CHLORIDE 0.9 % IV SOLN
1.0000 g | INTRAVENOUS | Status: DC
  Administered 2018-02-14: 1 g via INTRAVENOUS
  Filled 2018-02-14 (×3): qty 10
  Filled 2018-02-14: qty 1

## 2018-02-14 MED ORDER — FUROSEMIDE 20 MG PO TABS
20.0000 mg | ORAL_TABLET | Freq: Every day | ORAL | Status: DC
  Administered 2018-02-15: 20 mg via ORAL
  Filled 2018-02-14: qty 1

## 2018-02-14 MED ORDER — AZITHROMYCIN 250 MG PO TABS
500.0000 mg | ORAL_TABLET | Freq: Every day | ORAL | Status: DC
  Administered 2018-02-14 – 2018-02-15 (×2): 500 mg via ORAL
  Filled 2018-02-14 (×2): qty 2

## 2018-02-14 NOTE — Progress Notes (Signed)
PROGRESS NOTE  Beth Padilla TDV:[id number] DOB: 02/22/1940 DOA: 02/13/2018 PCP: Kendrick Ranch, MD  Brief History:   78 y.o. female with medical history of paroxysmal atrial fibrillation, cryptogenic stroke, diabetes mellitus type 2, hypertension, hyperlipidemia, CKD stage III presenting with 2 to 3-week history of shortness of breath.  The patient went to urgent care on 01/27/2018.  The patient had a chest x-ray done and was diagnosed with pneumonia.  She was discharged with doxycycline for 10 days.  She stated she took all 10 days without much improvement.  Her family has noticed increasing dyspnea on exertion during the past week.  As result, the patient was brought to emergency department for further evaluation.  The patient denies any fevers, chills, chest pain, nausea vomiting, diarrhea, abdominal pain, dysuria, hematuria.  She denies any hemoptysis but has a nonproductive cough.  In the emergency department, the patient was afebrile hemodynamically stable originally saturating 90% on room air.  The patient was placed on supplemental 2 L with oxygen saturation of 95%.  BMP showed a serum glucose of 423 with serum creatinine 2.18.  WBC was 13.9.  Hemoglobin was 7.0.  BNP was 688.0.  Chest x-ray showed pulmonary venous congestion with interstitial prominence.  The patient was started on intravenous furosemide with good clinical improvement.  Assessment/Plan: Acute respiratory failure with hypoxia -Secondary to CHF -Wean oxygen back to room air as tolerated for saturation greater 92% -weaned to RA -02/13/2018 CT chest--PAH, positive mediastinal lymphadenopathy.  Soft tissue fullness in the hilar region.  Diffuse patchy groundglass opacities with areas of consolidation LLL>RLL  Acute on chronic diastolic CHF -0/06/2692 echo EF 65-70%, no WMA, mild MR/left ear/AI, PASP 52 -Start IV furosemide 40 mg twice daily>>>transition to po lasix  -Echocardiogram -Daily weights--NEG  1.7 kg -Accurate I's and O's--incomplete  Mediastinal and hilar lymphadenopathy -Likely reactive from pneumonia -Treat consolidations as pneumonia, and repeat CT of the chest in 6 to 8 weeks  Symptomatic anemia/anemia of CKD -Patient's hemoglobin is gradually drifted down to 7.0 -Likely secondary to CKD -Baseline hemoglobin 8-9 -Transfuse 2 units PRBC with 40 mg of Lasix between units -FOBT -Check anemia panel--iron sat 9%, ferritin 54, folate 9.8, B12 696  Paroxysmal atrial fibrillation -Rate controlled -Continue apixaban -CHADSVASc = 6  CKD stage III--acute on chronic renal failure -Baseline creatinine 1.4-1.6 -Presenting creatinine 2.18 -May simply represent progression of chronic renal disease -Continue PhosLo -Continue bicarbonate  Uncontrolled diabetes mellitus type 2 with hyperglycemia -Check hemoglobin A1c--7.4 -NovoLog sliding scale -Presented with serum glucose 423, anion gap 9 -Patient was not checking her CBGs regularly at home and therefore not giving herself insulin regularly  Elevated troponin -Secondary to CHF and acute on chronic renal failure -No chest pain presently -EKG without concerning ischemic changes  Essential hypertension -Continue hydralazine, Isordil -Holding amlodipine pending echocardiogram -Anticipate improvement with diuresis -Discontinue chlorthalidone  Hyperlipidemia -Continue Lipitor  Hypothyroidism -Continue Synthroid    Disposition Plan:   Home 1/31 if stable Family Communication:   Daughter updated at bedside 1/30--Total time spent 35 minutes.  Greater than 50% spent face to face counseling and coordinating care.   Consultants:  none  Code Status:  FULL   DVT Prophylaxis:  SCDs   Procedures: As Listed in Progress Note Above  Antibiotics: Ceftriaxone 1/30>>> azithro 1/30>>>     Subjective: Patient states that her breathing is better.  She still has some mild dyspnea on exertion.  She denies any  fevers,  chills, chest pain, shortness breath, nausea, vomiting, diarrhea, abdominal pain.  There is no dysuria hematuria.  Objective: Vitals:   02/13/18 2302 02/14/18 0217 02/14/18 0500 02/14/18 0610  BP: (!) 138/52 (!) 141/56  (!) 155/61  Pulse: 86 88  84  Resp: 16 16  16   Temp: 98.6 F (37 C) 99 F (37.2 C)  97.9 F (36.6 C)  TempSrc: Oral Oral    SpO2: 100% 98%  100%  Weight:   80.8 kg   Height:        Intake/Output Summary (Last 24 hours) at 02/14/2018 1407 Last data filed at 02/14/2018 0824 Gross per 24 hour  Intake 1180 ml  Output 1750 ml  Net -570 ml   Weight change:  Exam:   General:  Pt is alert, follows commands appropriately, not in acute distress  HEENT: No icterus, No thrush, No neck mass, [place]/AT  Cardiovascular: IRRR, S1/S2, no rubs, no gallops  Respiratory: No wheezing.  Good air movement  Abdomen: Soft/+BS, non tender, non distended, no guarding  Extremities: No edema, No lymphangitis, No petechiae, No rashes, no synovitis   Data Reviewed: I have personally reviewed following labs and imaging studies Basic Metabolic Panel: Recent Labs  Lab 02/13/18 1142 02/14/18 0339  NA 135 137  K 4.4 3.9  CL 108 111  CO2 18* 18*  GLUCOSE 423* 179*  BUN 57* 53*  CREATININE 2.19* 2.10*  CALCIUM 8.6* 8.2*   Liver Function Tests: No results for input(s): AST, ALT, ALKPHOS, BILITOT, PROT, ALBUMIN in the last 168 hours. No results for input(s): LIPASE, AMYLASE in the last 168 hours. No results for input(s): AMMONIA in the last 168 hours. Coagulation Profile: No results for input(s): INR, PROTIME in the last 168 hours. CBC: Recent Labs  Lab 02/13/18 1142 02/14/18 0339  WBC 13.9*  --   NEUTROABS 11.8*  --   HGB 7.0* 8.5*  HCT 24.6* 28.2*  MCV 96.5  --   PLT 410*  --    Cardiac Enzymes: Recent Labs  Lab 02/13/18 1200 02/13/18 1655 02/14/18 0339 02/14/18 1027  TROPONINI 0.13* 0.14* 0.14* 0.13*   BNP: Invalid input(s): POCBNP CBG: Recent Labs   Lab 02/13/18 1653 02/13/18 2207 02/14/18 0733 02/14/18 1059  GLUCAP 319* 149* 164* 222*   HbA1C: Recent Labs    02/13/18 1655  HGBA1C 7.4*   Urine analysis:    Component Value Date/Time   COLORURINE STRAW (A) 02/13/2018 1705   APPEARANCEUR CLEAR 02/13/2018 1705   LABSPEC 1.006 02/13/2018 1705   [place] 7.0 02/13/2018 [place] 02/13/2018 1705   HGBUR NEGATIVE 02/13/2018 [place] 02/13/2018 [place] 02/13/2018 1705   PROTEINUR 30 (A) 02/13/2018 1705   UROBILINOGEN 0.2 07/18/2014 2120   NITRITE NEGATIVE 02/13/2018 1705   LEUKOCYTESUR SMALL (A) 02/13/2018 1705   Sepsis Labs: @LABRCNTIP (procalcitonin:4,lacticidven:4) ) Recent Results (from the past 240 hour(s))  Culture, Urine     Status: Abnormal   Collection Time: 02/13/18  5:05 PM  Result Value Ref Range Status   Specimen Description   Final    URINE, CLEAN CATCH Performed at [facility], [address], [place], [place] [address]    Special Requests   Final    NONE Performed at [facility], [address], [place], [place] [address]    Culture (A)  Final    >=100,000 COLONIES/mL GROUP B STREP(S.AGALACTIAE)ISOLATED TESTING AGAINST S. AGALACTIAE NOT ROUTINELY PERFORMED DUE TO PREDICTABILITY OF AMP/PEN/VAN SUSCEPTIBILITY. Performed at  [facility], [place] [address] [place], [place] [address]    Report Status 02/14/2018 FINAL  Final     Scheduled Meds: . apixaban  5 mg Oral BID  . atorvastatin  80 mg Oral QPM  . calcium acetate  667 mg Oral Once per day on Mon Thu  . escitalopram  10 mg Oral Daily  . ferrous sulfate  325 mg Oral BID  . [START ON 02/15/2018] furosemide  20 mg Oral Daily  . hydrALAZINE  100 mg Oral TID  . insulin aspart  0-15 Units Subcutaneous TID WC  . insulin aspart  0-5 Units Subcutaneous QHS  . isosorbide dinitrate  30 mg Oral TID  . levothyroxine  100 mcg Oral QAC breakfast  . pantoprazole  40 mg Oral Daily  . sodium bicarbonate   1,300 mg Oral BID  . sodium chloride flush  3 mL Intravenous Q12H   Continuous Infusions: . sodium chloride      Procedures/Studies: Dg Chest 2 View  Result Date: 02/13/2018 CLINICAL DATA:  Cough. EXAM: CHEST - 2 VIEW COMPARISON:  07/19/2014. FINDINGS: Cardiac monitor device noted over the chest. Cardiomegaly with pulmonary venous prominence, bilateral diffuse interstitial prominence, and right-sided pleural effusion. Findings consistent with CHF. Interstitial prominence secondary to other etiologies including pneumonitis can not be excluded. No pneumothorax. IMPRESSION: Cardiomegaly with pulmonary venous congestion, diffuse bilateral interstitial prominence, and right-sided pleural effusion. Findings most consistent CHF. Pneumonitis can not be excluded. Electronically Signed   By: Marcello Moores  Register   On: 02/13/2018 10:33   Ct Chest Wo Contrast  Result Date: 02/13/2018 CLINICAL DATA:  Productive cough and shortness of breath. EXAM: CT CHEST WITHOUT CONTRAST TECHNIQUE: Multidetector CT imaging of the chest was performed following the standard protocol without IV contrast. COMPARISON:  None. FINDINGS: Cardiovascular: Heart is enlarged. Atherosclerotic calcification is noted in the wall of the thoracic aorta. Pulmonary arteries enlarged, suggesting pulmonary arterial hypertension. Mediastinum/Nodes: Mediastinal lymphadenopathy noted. 12 mm short axis prevascular node seen on 53/2. 15 mm short axis right paratracheal node visible on 56/2. Soft tissue fullness in the hilar regions suggests lymphadenopathy although not well demonstrated given lack of intravenous contrast material. The esophagus has normal imaging features. There is no axillary lymphadenopathy. Lungs/Pleura: The central tracheobronchial airways are patent. Diffuse patchy ground-glass attenuation is noted in the lungs bilaterally with areas of more confluent airspace consolidation in the lower lungs, right greater than left. Tiny right pleural  effusion evident. Upper Abdomen: Unremarkable. Musculoskeletal: No worrisome lytic or sclerotic osseous abnormality. IMPRESSION: 1. Patchy ground-glass attenuation in the lungs bilaterally with more confluent airspace consolidation in the lower lungs bilaterally, right greater than left. Multifocal pneumonia a concern and background pulmonary edema suspected. 2. Tiny right pleural effusion. 3. Mediastinal and hilar lymphadenopathy, potentially reactive. Consider follow-up CT chest in 3 months to ensure resolution. 4. Cardiomegaly. Enlargement of the main pulmonary arteries suggests pulmonary arterial hypertension. Electronically Signed   By: Misty Stanley M.D.   On: 02/13/2018 18:25    Orson Eva, DO  [facility] Pager [phone]  If 7PM-7AM, please contact night-coverage [email or web address] Password [email or web address] 02/14/2018, 2:07 PM   LOS: 1 day

## 2018-02-14 NOTE — Progress Notes (Signed)
*  PRELIMINARY RESULTS* Echocardiogram 2D Echocardiogram has been performed.  Beth Padilla 02/14/2018, 2:52 PM

## 2018-02-15 LAB — BASIC METABOLIC PANEL
Anion gap: 11 (ref 5–15)
BUN: 55 mg/dL — ABNORMAL HIGH (ref 8–23)
CO2: 19 mmol/L — ABNORMAL LOW (ref 22–32)
Calcium: 8.3 mg/dL — ABNORMAL LOW (ref 8.9–10.3)
Chloride: 109 mmol/L (ref 98–111)
Creatinine, Ser: 2.21 mg/dL — ABNORMAL HIGH (ref 0.44–1.00)
GFR calc Af Amer: 24 mL/min — ABNORMAL LOW (ref >60)
GFR calc non Af Amer: 21 mL/min — ABNORMAL LOW (ref >60)
Glucose, Bld: 177 mg/dL — ABNORMAL HIGH (ref 70–99)
Potassium: 4 mmol/L (ref 3.5–5.1)
Sodium: 139 mmol/L (ref 135–145)

## 2018-02-15 LAB — CBC
HCT: 29.8 % — ABNORMAL LOW (ref 36.0–46.0)
Hemoglobin: 9.2 g/dL — ABNORMAL LOW (ref 12.0–15.0)
MCH: 29 pg (ref 26.0–34.0)
MCHC: 30.9 g/dL (ref 30.0–36.0)
MCV: 94 fL (ref 80.0–100.0)
NRBC: 0.2 % (ref 0.0–0.2)
Platelets: 347 10*3/uL (ref 150–400)
RBC: 3.17 MIL/uL — ABNORMAL LOW (ref 3.87–5.11)
RDW: 17.8 % — ABNORMAL HIGH (ref 11.5–15.5)
WBC: 11.5 10*3/uL — ABNORMAL HIGH (ref 4.0–10.5)

## 2018-02-15 LAB — MAGNESIUM: Magnesium: 2.1 mg/dL (ref 1.7–2.4)

## 2018-02-15 LAB — GLUCOSE, CAPILLARY: Glucose-Capillary: 176 mg/dL — ABNORMAL HIGH (ref 70–99)

## 2018-02-15 MED ORDER — CEFDINIR 300 MG PO CAPS: 300.0000 mg | ORAL_CAPSULE | Freq: Two times a day (BID) | ORAL | 0 refills | Status: DC

## 2018-02-15 MED ORDER — TRAZODONE HCL 50 MG PO TABS
50.0000 mg | ORAL_TABLET | Freq: Once | ORAL | Status: AC
  Administered 2018-02-15: 50 mg via ORAL
  Filled 2018-02-15: qty 1

## 2018-02-15 MED ORDER — FUROSEMIDE 20 MG PO TABS: 20.0000 mg | ORAL_TABLET | Freq: Every day | ORAL | 1 refills | Status: DC

## 2018-02-15 MED ORDER — AZITHROMYCIN 500 MG PO TABS: 500.0000 mg | ORAL_TABLET | Freq: Every day | ORAL | 0 refills | Status: DC

## 2018-02-15 NOTE — Progress Notes (Signed)
IV removed by Almyra Free, NT, patient tolerated well. Reviewed AVS with patient who verbalized understanding.  Patient awaiting daughter's arrival to transport home.

## 2018-02-15 NOTE — Discharge Summary (Signed)
Physician Discharge Summary  Beth Padilla:[id number] DOB: [birth year] DOA: 02/13/2018  PCP: Kendrick Ranch, MD  Admit date: 02/13/2018 Discharge date: 02/15/2018  Admitted From: Home Disposition:  Home   Recommendations for Outpatient Follow-up:  1. Follow up with PCP in 1-2 weeks 2. Please obtain BMP/CBC in one week    Discharge Condition: Stable CODE STATUS: FULL Diet recommendation: Heart Healthy / Carb Modified   Brief/Interim Summary: 78 y.o.femalewith medical history ofparoxysmal atrial fibrillation, cryptogenic stroke, diabetes mellitus type 2, hypertension, hyperlipidemia, CKD stage III presenting with 2 to 3-week history of shortness of breath. The patient went to urgent care on 01/27/2018. The patient had a chest x-ray done and was diagnosed with pneumonia. She was discharged with doxycycline for 10 days. She stated she took all 10 days without much improvement. Her family has noticed increasing dyspnea on exertion during the past week. As result, the patient was brought to emergency department for further evaluation. The patient denies any fevers, chills, chest pain, nausea vomiting, diarrhea, abdominal pain, dysuria, hematuria. She denies any hemoptysis but has a nonproductive cough.  In the emergency department, the patient was afebrile hemodynamically stable originally saturating 90% on room air. The patient was placed on supplemental 2 L with oxygen saturation of 95%. BMP showed a serum glucose of 423 with serum creatinine 2.18. WBC was 13.9. Hemoglobin was 7.0. BNP was 688.0. Chest x-ray showed pulmonary venous congestion with interstitial prominence.  The patient was started on intravenous furosemide with good clinical improvement.  Discharge Diagnoses:  Acute respiratory failure with hypoxia -Secondary to CHF -Wean oxygen back to room air as tolerated for saturation greater 92% -weaned to RA -02/13/2018 CT chest--PAH, positive  mediastinal lymphadenopathy.  Soft tissue fullness in the hilar region.  Diffuse patchy groundglass opacities with areas of consolidation LLL>RLL  Acute on chronic diastolic CHF -08/19/1515 echo EF 65-70%, no WMA, mild MR/left ear/AI, PASP 52 -Start IV furosemide 40 mg twice daily>>>transition to po lasix 20 mg daily -02/14/18 Echocardiogram--EF 60-65%, moderate aortic stenosis, mild MR/AI, mild to moderate TR -Daily weights--NEG 1.7 kg -Accurate I's and O's--incomplete  Mediastinal and hilar lymphadenopathy -Likely reactive from pneumonia -Treat consolidations as pneumonia, and repeat CT of the chest in 6 to 8 weeks -Discharge home with cefdinir and azithromycin for 4 additional days to complete 5 days of therapy  Aortic stenosis -02/14/2018 echo--she has moderate aortic stenosis -Patient already has established cardiologist in [place] with whom she needs to follow-up  Symptomatic anemia/anemia of CKD -Patient's hemoglobin is gradually drifted down to 7.0 -Likely secondary to CKD -Baseline hemoglobin 8-9 -Transfuse 2 units PRBC with 40 mg of Lasix between units -FOBT -Check anemia panel--iron sat 9%, ferritin 54, folate 9.8, B12 696 -Hemoglobin remained stable after transfusion -Hemoglobin 9.2 on the day of discharge  Paroxysmal atrial fibrillation -Rate controlled -Continue apixaban -CHADSVASc = 6  CKD stage III--acute on chronic renal failure -Baseline creatinine 1.4-1.6 -Presenting creatinine 2.18 -May simply represent progression of chronic renal disease -Continue PhosLo -Continue bicarbonate -Renal function remained stable with diuresis -Patient likely has new renal baseline 2.1-2.2  Uncontrolled diabetes mellitus type 2 with hyperglycemia -Check hemoglobin A1c--7.4 -NovoLog sliding scale -Presented with serum glucose 423, anion gap 9 -Patient was not checking her CBGs regularly at home and therefore not giving herself insulin regularly  Elevated  troponin -Secondary to CHF and acute on chronic renal failure -No chest pain presently -EKG without concerning ischemic changes  Essential hypertension -Continue hydralazine, Isordil -Holding amlodipine pending echocardiogram -Anticipate  improvement with diuresis -Discontinue chlorthalidone  Hyperlipidemia -Continue Lipitor  Hypothyroidism -Continue Synthroid   Discharge Instructions   Allergies as of 02/15/2018      Reactions   Ampicillin Rash      Medication List    STOP taking these medications   chlorthalidone 50 MG tablet Commonly known as:  HYGROTON   NIFEdipine 30 MG 24 hr tablet Commonly known as:  ADALAT CC     TAKE these medications   acetaminophen 650 MG CR tablet Commonly known as:  TYLENOL Take 650 mg by mouth every 8 (eight) hours as needed for pain.   amLODipine 5 MG tablet Commonly known as:  NORVASC Take 5 mg by mouth daily.   atorvastatin 80 MG tablet Commonly known as:  LIPITOR Take 1 tablet (80 mg total) by mouth every evening.   azithromycin 500 MG tablet Commonly known as:  ZITHROMAX Take 1 tablet (500 mg total) by mouth daily.   calcium acetate 667 MG capsule Commonly known as:  PHOSLO Take 1 capsule by mouth 2 (two) times a week. Mondays and tuursdays   cefdinir 300 MG capsule Commonly known as:  OMNICEF Take 1 capsule (300 mg total) by mouth 2 (two) times daily.   ELIQUIS 5 MG Tabs tablet Generic drug:  apixaban Take 5 mg by mouth 2 (two) times daily. Started by Dr.Zagol in [place], [place]   escitalopram 10 MG tablet Commonly known as:  LEXAPRO Take 10 mg by mouth daily.   ferrous sulfate 325 (65 FE) MG tablet Take 1 tablet by mouth 2 (two) times daily.   furosemide 20 MG tablet Commonly known as:  LASIX Take 1 tablet (20 mg total) by mouth daily.   hydrALAZINE 100 MG tablet Commonly known as:  APRESOLINE Take 100 mg by mouth 3 (three) times daily.   isosorbide dinitrate 30 MG tablet Commonly known as:   ISORDIL Take 30 mg by mouth 3 (three) times daily.   levothyroxine 100 MCG tablet Commonly known as:  SYNTHROID, LEVOTHROID Take 100 mcg by mouth daily before breakfast.   omeprazole 20 MG capsule Commonly known as:  PRILOSEC Take 20 mg by mouth daily.   sodium bicarbonate 650 MG tablet Take 1,300 mg by mouth 2 (two) times daily.   vitamin A & D ointment Apply 1 application topically as needed.       Allergies  Allergen Reactions  . Ampicillin Rash    Consultations:  none   Procedures/Studies: Dg Chest 2 View  Result Date: 02/13/2018 CLINICAL DATA:  Cough. EXAM: CHEST - 2 VIEW COMPARISON:  07/19/2014. FINDINGS: Cardiac monitor device noted over the chest. Cardiomegaly with pulmonary venous prominence, bilateral diffuse interstitial prominence, and right-sided pleural effusion. Findings consistent with CHF. Interstitial prominence secondary to other etiologies including pneumonitis can not be excluded. No pneumothorax. IMPRESSION: Cardiomegaly with pulmonary venous congestion, diffuse bilateral interstitial prominence, and right-sided pleural effusion. Findings most consistent CHF. Pneumonitis can not be excluded. Electronically Signed   By: Marcello Moores  Register   On: 02/13/2018 10:33   Ct Chest Wo Contrast  Result Date: 02/13/2018 CLINICAL DATA:  Productive cough and shortness of breath. EXAM: CT CHEST WITHOUT CONTRAST TECHNIQUE: Multidetector CT imaging of the chest was performed following the standard protocol without IV contrast. COMPARISON:  None. FINDINGS: Cardiovascular: Heart is enlarged. Atherosclerotic calcification is noted in the wall of the thoracic aorta. Pulmonary arteries enlarged, suggesting pulmonary arterial hypertension. Mediastinum/Nodes: Mediastinal lymphadenopathy noted. 12 mm short axis prevascular node seen on 53/2. 15 mm short axis  right paratracheal node visible on 56/2. Soft tissue fullness in the hilar regions suggests lymphadenopathy although not well  demonstrated given lack of intravenous contrast material. The esophagus has normal imaging features. There is no axillary lymphadenopathy. Lungs/Pleura: The central tracheobronchial airways are patent. Diffuse patchy ground-glass attenuation is noted in the lungs bilaterally with areas of more confluent airspace consolidation in the lower lungs, right greater than left. Tiny right pleural effusion evident. Upper Abdomen: Unremarkable. Musculoskeletal: No worrisome lytic or sclerotic osseous abnormality. IMPRESSION: 1. Patchy ground-glass attenuation in the lungs bilaterally with more confluent airspace consolidation in the lower lungs bilaterally, right greater than left. Multifocal pneumonia a concern and background pulmonary edema suspected. 2. Tiny right pleural effusion. 3. Mediastinal and hilar lymphadenopathy, potentially reactive. Consider follow-up CT chest in 3 months to ensure resolution. 4. Cardiomegaly. Enlargement of the main pulmonary arteries suggests pulmonary arterial hypertension. Electronically Signed   By: Misty Stanley M.D.   On: 02/13/2018 18:25        Discharge Exam: Vitals:   02/14/18 2124 02/15/18 0551  BP: 139/68 (!) 143/67  Pulse: 80 81  Resp: 16 18  Temp: 99.1 F (37.3 C) 98.7 F (37.1 C)  SpO2: 96% 96%   Vitals:   02/14/18 0610 02/14/18 1530 02/14/18 2124 02/15/18 0551  BP: (!) 155/61 (!) 151/54 139/68 (!) 143/67  Pulse: 84 77 80 81  Resp: 16 18 16 18   Temp: 97.9 F (36.6 C) 99.1 F (37.3 C) 99.1 F (37.3 C) 98.7 F (37.1 C)  TempSrc:  Oral Oral Oral  SpO2: 100% 95% 96% 96%  Weight:      Height:        General: Pt is alert, awake, not in acute distress Cardiovascular: IRRR, S1/S2 +, no rubs, no gallops Respiratory: bibasilar rales, no wheeze Abdominal: Soft, NT, ND, bowel sounds + Extremities: no edema, no cyanosis   The results of significant diagnostics from this hospitalization (including imaging, microbiology, ancillary and laboratory) are  listed below for reference.    Significant Diagnostic Studies: Dg Chest 2 View  Result Date: 02/13/2018 CLINICAL DATA:  Cough. EXAM: CHEST - 2 VIEW COMPARISON:  07/19/2014. FINDINGS: Cardiac monitor device noted over the chest. Cardiomegaly with pulmonary venous prominence, bilateral diffuse interstitial prominence, and right-sided pleural effusion. Findings consistent with CHF. Interstitial prominence secondary to other etiologies including pneumonitis can not be excluded. No pneumothorax. IMPRESSION: Cardiomegaly with pulmonary venous congestion, diffuse bilateral interstitial prominence, and right-sided pleural effusion. Findings most consistent CHF. Pneumonitis can not be excluded. Electronically Signed   By: Marcello Moores  Register   On: 02/13/2018 10:33   Ct Chest Wo Contrast  Result Date: 02/13/2018 CLINICAL DATA:  Productive cough and shortness of breath. EXAM: CT CHEST WITHOUT CONTRAST TECHNIQUE: Multidetector CT imaging of the chest was performed following the standard protocol without IV contrast. COMPARISON:  None. FINDINGS: Cardiovascular: Heart is enlarged. Atherosclerotic calcification is noted in the wall of the thoracic aorta. Pulmonary arteries enlarged, suggesting pulmonary arterial hypertension. Mediastinum/Nodes: Mediastinal lymphadenopathy noted. 12 mm short axis prevascular node seen on 53/2. 15 mm short axis right paratracheal node visible on 56/2. Soft tissue fullness in the hilar regions suggests lymphadenopathy although not well demonstrated given lack of intravenous contrast material. The esophagus has normal imaging features. There is no axillary lymphadenopathy. Lungs/Pleura: The central tracheobronchial airways are patent. Diffuse patchy ground-glass attenuation is noted in the lungs bilaterally with areas of more confluent airspace consolidation in the lower lungs, right greater than left. Tiny right pleural effusion evident.  Upper Abdomen: Unremarkable. Musculoskeletal: No  worrisome lytic or sclerotic osseous abnormality. IMPRESSION: 1. Patchy ground-glass attenuation in the lungs bilaterally with more confluent airspace consolidation in the lower lungs bilaterally, right greater than left. Multifocal pneumonia a concern and background pulmonary edema suspected. 2. Tiny right pleural effusion. 3. Mediastinal and hilar lymphadenopathy, potentially reactive. Consider follow-up CT chest in 3 months to ensure resolution. 4. Cardiomegaly. Enlargement of the main pulmonary arteries suggests pulmonary arterial hypertension. Electronically Signed   By: Misty Stanley M.D.   On: 02/13/2018 18:25     Microbiology: Recent Results (from the past 240 hour(s))  Culture, Urine     Status: Abnormal   Collection Time: 02/13/18  5:05 PM  Result Value Ref Range Status   Specimen Description   Final    URINE, CLEAN CATCH Performed at [facility], [address] [place], [place] [address]    Special Requests   Final    NONE Performed at [facility], [address] [place], [place] [address]    Culture (A)  Final    >=100,000 COLONIES/mL GROUP B STREP(S.AGALACTIAE)ISOLATED TESTING AGAINST S. AGALACTIAE NOT ROUTINELY PERFORMED DUE TO PREDICTABILITY OF AMP/PEN/VAN SUSCEPTIBILITY. Performed at [facility], [place] [address] [place], [place] [address]    Report Status 02/14/2018 FINAL  Final     Labs: Basic Metabolic Panel: Recent Labs  Lab 02/13/18 1142 02/14/18 0339 02/15/18 0629  NA 135 137 139  K 4.4 3.9 4.0  CL 108 111 109  CO2 18* 18* 19*  GLUCOSE 423* 179* 177*  BUN 57* 53* 55*  CREATININE 2.19* 2.10* 2.21*  CALCIUM 8.6* 8.2* 8.3*  MG  --   --  2.1   Liver Function Tests: No results for input(s): AST, ALT, ALKPHOS, BILITOT, PROT, ALBUMIN in the last 168 hours. No results for input(s): LIPASE, AMYLASE in the last 168 hours. No results for input(s): AMMONIA in the last 168 hours. CBC: Recent Labs  Lab 02/13/18 1142 02/14/18 0339 02/15/18 0629  WBC  13.9*  --  11.5*  NEUTROABS 11.8*  --   --   HGB 7.0* 8.5* 9.2*  HCT 24.6* 28.2* 29.8*  MCV 96.5  --  94.0  PLT 410*  --  347   Cardiac Enzymes: Recent Labs  Lab 02/13/18 1200 02/13/18 1655 02/14/18 0339 02/14/18 1027  TROPONINI 0.13* 0.14* 0.14* 0.13*   BNP: Invalid input(s): POCBNP CBG: Recent Labs  Lab 02/14/18 0733 02/14/18 1059 02/14/18 1609 02/14/18 2059 02/15/18 0727  GLUCAP 164* 222* 164* 217* 176*    Time coordinating discharge:  36 minutes  Signed:  Orson Eva, DO [facility] Pager: [phone] 02/15/2018, 8:53 AM

## 2018-02-15 NOTE — Care Management Note (Signed)
Case Management Note  Patient Details  Name: Beth Padilla MRN: [id number] Date of Birth: 1940/01/22  Subjective/Objective:   Admitted with CHF. Pt from home, lives with son. ind with ADL's. Has insurance and PCP. Has transportation and will be able to get Rx filled. She has a scale and weighs herself daily.                  Action/Plan: DC home with self care today. No CM needs noted this admission.   Expected Discharge Date:  02/15/18               Expected Discharge Plan:  [place]  In-House Referral:  NA  Discharge planning Services  CM Consult  Post Acute Care Choice:  NA Choice offered to:  NA  Status of Service:  Completed, signed off  If discussed at Long Length of Stay Meetings, dates discussed:    Additional Comments:  Sherald Barge, RN 02/15/2018, 10:44 AM

## 2018-03-06 ENCOUNTER — Encounter: Payer: Self-pay | Admitting: Internal Medicine

## 2018-05-07 ENCOUNTER — Telehealth: Payer: Self-pay

## 2018-05-07 NOTE — Telephone Encounter (Signed)
Spoke with pt regarding appt on 05/08/18. Pt stated she does not have access to a smart devise and has no one to assist her. Pt is unable to upload EKG, but will check vitals prior to appt. Pt questions and concerns were address.

## 2018-05-08 ENCOUNTER — Other Ambulatory Visit: Payer: Self-pay

## 2018-05-08 ENCOUNTER — Telehealth (INDEPENDENT_AMBULATORY_CARE_PROVIDER_SITE_OTHER): Payer: Medicare HMO | Admitting: Internal Medicine

## 2018-05-08 VITALS — BP 139/70 | HR 72

## 2018-05-08 DIAGNOSIS — I480 Paroxysmal atrial fibrillation: Secondary | ICD-10-CM

## 2018-05-08 NOTE — Progress Notes (Signed)
Electrophysiology TeleHealth Note   Due to national recommendations of social distancing due to COVID 19, an audio/video telehealth visit is felt to be most appropriate for this patient at this time.  Verbal consent was obtained from the patient today.  She does not have a smart phone or internet.  We therefore could not do video visit today.   Date:  05/08/2018   ID:  Beth Padilla, Beth Padilla 11/20/1940, MRN [id number]  Location: patient's home  Provider location: [address], [place]  Evaluation Performed: Follow-up visit  PCP:  Kendrick Ranch, MD   Electrophysiologist:  Dr [name]  Chief Complaint:  afib  History of Present Illness:    Beth Padilla is a 78 y.o. female who presents via audio conferencing for a telehealth visit today.  Since last being seen in our clinic, the patient reports doing very well. She has been diagnosed with afib and started on eliquis.  Doing well.  Her ILR is at RRT.  She wishes to have this removed.  Today, she denies symptoms of palpitations, chest pain, shortness of breath,  lower extremity edema, dizziness, presyncope, or syncope.  The patient is otherwise without complaint today.  The patient denies symptoms of fevers, chills, cough, or new SOB worrisome for COVID 19.  Past Medical History:  Diagnosis Date   Anemia    Atrial fibrillation ([place])    Depression    Diabetes mellitus    Hyperlipidemia 12/15/2010   Hypertension    Hypothyroidism 12/15/2010   Pneumonia    Renal disorder     Past Surgical History:  Procedure Laterality Date   ABDOMINAL HYSTERECTOMY     EP IMPLANTABLE DEVICE N/A 07/21/2014   Procedure: Loop Recorder Insertion;  Surgeon: [name], MD;  Location: [facility];  Service: Cardiovascular;  Laterality: N/A;   TEE WITHOUT CARDIOVERSION N/A 07/21/2014   Procedure: TRANSESOPHAGEAL ECHOCARDIOGRAM (TEE);  Surgeon: [name], MD;  Location: [facility] ENDOSCOPY;  Service:  Cardiovascular;  Laterality: N/A;    Current Outpatient Medications  Medication Sig Dispense Refill   acetaminophen (TYLENOL) 650 MG CR tablet Take 650 mg by mouth every 8 (eight) hours as needed for pain.     acetaZOLAMIDE (DIAMOX) 250 MG tablet Take 1 tablet by mouth daily.     amLODipine (NORVASC) 5 MG tablet Take 5 mg by mouth daily.     apixaban (ELIQUIS) 2.5 MG TABS tablet Take 2.5 mg by mouth 2 (two) times daily.     atorvastatin (LIPITOR) 80 MG tablet Take 1 tablet (80 mg total) by mouth every evening. 30 tablet 2   azithromycin (ZITHROMAX) 500 MG tablet Take 1 tablet (500 mg total) by mouth daily. 4 tablet 0   calcium acetate (PHOSLO) 667 MG capsule Take 1 capsule by mouth 2 (two) times a week. Mondays and tuursdays     cefdinir (OMNICEF) 300 MG capsule Take 1 capsule (300 mg total) by mouth 2 (two) times daily. 8 capsule 0   escitalopram (LEXAPRO) 10 MG tablet Take 10 mg by mouth daily.     ferrous sulfate 325 (65 FE) MG tablet Take 1 tablet by mouth 2 (two) times daily.     furosemide (LASIX) 20 MG tablet Take 1 tablet (20 mg total) by mouth daily. 30 tablet 1   gabapentin (NEURONTIN) 300 MG capsule Take 1 capsule by mouth daily.     hydrALAZINE (APRESOLINE) 100 MG tablet Take 100 mg by mouth 3 (three) times daily.  isosorbide dinitrate (ISORDIL) 30 MG tablet Take 30 mg by mouth 3 (three) times daily.      levothyroxine (SYNTHROID, LEVOTHROID) 100 MCG tablet Take 100 mcg by mouth daily before breakfast.     omeprazole (PRILOSEC) 20 MG capsule Take 20 mg by mouth daily.     sodium bicarbonate 650 MG tablet Take 1,300 mg by mouth 2 (two) times daily.     valsartan (DIOVAN) 80 MG tablet Take 1 tablet by mouth daily.     Vitamins A & D (VITAMIN A & D) ointment Apply 1 application topically as needed.     No current facility-administered medications for this visit.     Allergies:   Ampicillin   Social History:  The patient  reports that she has never smoked.  She has never used smokeless tobacco. She reports that she does not drink alcohol or use drugs.   Family History:  The patient's family history includes Cancer in her sister; Stroke in her mother.   ROS:  Please see the history of present illness.   All other systems are personally reviewed and negative.    Exam:    Vital Signs:  BP 139/70    Pulse 72   Well sounding   Labs/Other Tests and Data Reviewed:    Recent Labs: 02/13/2018: B Natriuretic Peptide 688.0 02/15/2018: BUN 55; Creatinine, Ser 2.21; Hemoglobin 9.2; Magnesium 2.1; Platelets 347; Potassium 4.0; Sodium 139   Wt Readings from Last 3 Encounters:  02/14/18 178 lb 2.1 oz (80.8 kg)  11/01/15 164 lb (74.4 kg)  10/06/14 164 lb 8 oz (74.6 kg)     Other studies personally reviewed: Additional studies/ records that were reviewed today include: EP APPs prior notes,  Hospital records    ASSESSMENT & PLAN:    1.  afib Asymptomatic On eliquis given prior stroke  2. Chronic diastolic dysfunction Stable No change required today  3. Prior stroke ILR has detected afib previously.  She is on eliquis She wishes to have her device removed.  We discussed at length today.  She wishes to proceed.  We will therefore put her on the list for removal after COVID 19.  4. COVID 19 screen The patient denies symptoms of COVID 19 at this time.  The importance of social distancing was discussed today.   Patient Risk:  after full review of this patients clinical status, I feel that they are at moderate risk at this time.  Today, I have spent 12 minutes with the patient with telehealth technology discussing loop removal .    Signed, [name], MD  05/08/2018 11:27 AM     [facility] [address] [place] [address] [phone] (office) [phone] (fax)

## 2018-06-17 ENCOUNTER — Telehealth: Payer: Self-pay

## 2018-06-17 NOTE — Telephone Encounter (Signed)
Spoke with Pt.  She is interested in having her loop removed on June 10, but she needs to find out if her daughter can drive her.  Advised this nurse would return her call on Thursday to see if she was able.

## 2018-06-24 NOTE — Telephone Encounter (Signed)
Left detailed message for Pt.  Advised to call back if she would like to have her loop explanted on Wednesday June 10 at 10:30 am.  Advised to ask for this nurse.

## 2018-10-10 ENCOUNTER — Telehealth: Payer: Self-pay | Admitting: Internal Medicine

## 2018-10-10 NOTE — Telephone Encounter (Signed)
  Patient wants to set up appt to have loop removed

## 2018-10-10 NOTE — Telephone Encounter (Signed)
New message   [facility] for pt to call and schedule an appt with Dr. [name] on next DOD day (09.28.20or 10.12.20) to have linq removed. If pt returns call, please reach out to direct office line. [phone]

## 2018-10-14 ENCOUNTER — Ambulatory Visit (INDEPENDENT_AMBULATORY_CARE_PROVIDER_SITE_OTHER): Payer: Medicare HMO | Admitting: Internal Medicine

## 2018-10-14 ENCOUNTER — Other Ambulatory Visit: Payer: Self-pay

## 2018-10-14 ENCOUNTER — Encounter: Payer: Self-pay | Admitting: Internal Medicine

## 2018-10-14 VITALS — BP 170/62 | HR 67 | Ht 67.0 in | Wt 172.0 lb

## 2018-10-14 DIAGNOSIS — I480 Paroxysmal atrial fibrillation: Secondary | ICD-10-CM | POA: Diagnosis not present

## 2018-10-14 DIAGNOSIS — I10 Essential (primary) hypertension: Secondary | ICD-10-CM | POA: Diagnosis not present

## 2018-10-14 HISTORY — PX: OTHER SURGICAL HISTORY: SHX169

## 2018-10-14 NOTE — Progress Notes (Signed)
PCP: Kendrick Ranch, MD Primary EP: Dr [name]  [place] Beth Padilla is a 78 y.o. female who presents today for electrophysiology followup.  Since last being seen in our clinic, the patient reports doing very well.  She is active for her age.  Her loop recorder has reached EOL.  She would like to have this removed.  Today, she denies symptoms of palpitations, chest pain, shortness of breath,  lower extremity edema, dizziness, presyncope, or syncope.  The patient is otherwise without complaint today.   Past Medical History:  Diagnosis Date  . Anemia   . Atrial fibrillation ([place])   . Depression   . Diabetes mellitus   . Hyperlipidemia 12/15/2010  . Hypertension   . Hypothyroidism 12/15/2010  . Pneumonia   . Renal disorder    Past Surgical History:  Procedure Laterality Date  . ABDOMINAL HYSTERECTOMY    . EP IMPLANTABLE DEVICE N/A 07/21/2014   Procedure: Loop Recorder Insertion;  Surgeon: [name], MD;  Location: [place] [facility];  Service: Cardiovascular;  Laterality: N/A;  . TEE WITHOUT CARDIOVERSION N/A 07/21/2014   Procedure: TRANSESOPHAGEAL ECHOCARDIOGRAM (TEE);  Surgeon: [name], MD;  Location: [facility] ENDOSCOPY;  Service: Cardiovascular;  Laterality: N/A;    ROS- all systems are reviewed and negatives except as per HPI above  Current Outpatient Medications  Medication Sig Dispense Refill  . acetaminophen (TYLENOL) 650 MG CR tablet Take 650 mg by mouth every 8 (eight) hours as needed for pain.    Marland Kitchen acetaZOLAMIDE (DIAMOX) 250 MG tablet Take 1 tablet by mouth daily.    Marland Kitchen amLODipine (NORVASC) 10 MG tablet Take 5 mg by mouth daily.    Marland Kitchen apixaban (ELIQUIS) 2.5 MG TABS tablet Take 2.5 mg by mouth 2 (two) times daily.    Marland Kitchen atorvastatin (LIPITOR) 80 MG tablet Take 1 tablet (80 mg total) by mouth every evening. 30 tablet 2  . calcium acetate (PHOSLO) 667 MG capsule Take 1 capsule by mouth 2 (two) times a week. Mondays and tuursdays    . escitalopram (LEXAPRO) 10 MG  tablet Take 10 mg by mouth daily.    . ferrous sulfate 325 (65 FE) MG tablet Take 1 tablet by mouth 2 (two) times daily.    . furosemide (LASIX) 20 MG tablet Take 1 tablet (20 mg total) by mouth daily. 30 tablet 1  . gabapentin (NEURONTIN) 300 MG capsule Take 1 capsule by mouth daily.    . hydrALAZINE (APRESOLINE) 100 MG tablet Take 100 mg by mouth 3 (three) times daily.    . isosorbide dinitrate (ISORDIL) 30 MG tablet Take 30 mg by mouth 3 (three) times daily.     Marland Kitchen levothyroxine (SYNTHROID, LEVOTHROID) 100 MCG tablet Take 100 mcg by mouth daily before breakfast.    . omeprazole (PRILOSEC) 20 MG capsule Take 20 mg by mouth daily.    . sodium bicarbonate 650 MG tablet Take 1,300 mg by mouth 2 (two) times daily.    . valsartan (DIOVAN) 80 MG tablet Take 1 tablet by mouth daily.    . Vitamins A & D (VITAMIN A & D) ointment Apply 1 application topically as needed.     No current facility-administered medications for this visit.     Physical Exam: Vitals:   10/14/18 1158  BP: (!) 170/62  Pulse: 67  SpO2: 98%  Weight: 172 lb (78 kg)  Height: 5\' 7"  (1.702 m)    GEN- The patient is well appearing, alert and oriented x 3 today.  Head- normocephalic, atraumatic Eyes-  Sclera clear, conjunctiva pink Ears- hearing intact Oropharynx- clear Lungs-   normal work of breathing Heart- Regular rate and rhythm  GI- soft, NT, ND, + BS Extremities- no clubbing, cyanosis, or edema  Wt Readings from Last 3 Encounters:  10/14/18 172 lb (78 kg)  02/14/18 178 lb 2.1 oz (80.8 kg)  11/01/15 164 lb (74.4 kg)    Assessment and Plan:  1. Paroxysmal atrial fibrillation On eliquis for chads2vasc score of 6.  Asymptomatic No changes She wishes to have her ILR removed today.  Risks and benefits to ILR removal were discussed with the patient who wishes to proceed.  2. HTN Stable No change required today  3. Asymptomatic bradycardia No indication for further workup   [name] MD, [facility]  10/14/2018 12:18 PM    PROCEDURES:   1. Implantable loop recorder explantation    DESCRIPTION OF PROCEDURE:  Informed written consent was obtained.  The patient required no sedation for the procedure today.   The patients left chest was therefore prepped and draped in the usual sterile fashion.  The skin overlying the ILR monitor was infiltrated with lidocaine for local analgesia.  A 0.5-cm incision was made over the site.  The previously implanted ILR was exposed and removed using a combination of sharp and blunt dissection.  Steri- Strips and a sterile dressing were then applied. EBL<69ml.  There were no early apparent complications.     CONCLUSIONS:   1. Successful explantation of a [employer] Reveal LINQ implantable loop recorder   2. No early apparent complications.        [name] MD, [facility] 10/14/2018 2:59 PM

## 2018-10-14 NOTE — Patient Instructions (Signed)
Medication Instructions:  Your physician recommends that you continue on your current medications as directed. Please refer to the Current Medication list given to you today.  Labwork: None ordered.  Testing/Procedures: None ordered.  Follow-Up: Your physician recommends that you schedule a follow-up appointment in:   6 months with Tommye Standard, PA     Any Other Special Instructions Will Be Listed Below (If Applicable).   Wound Care, Adult Taking care of your wound properly can help to prevent pain, infection, and scarring. It can also help your wound to heal more quickly. How to care for your wound Wound care      Follow instructions from your health care provider about how to take care of your wound. Make sure you: ? Wash your hands with soap and water before you change the bandage (dressing). If soap and water are not available, use hand sanitizer. ? You may remove your outer dressing in 24 hours. ? Leave stitches (sutures), skin glue, or adhesive strips in place. These skin closures may need to stay in place for 2 weeks or longer. If adhesive strip edges start to loosen and curl up, you may trim the loose edges. Do not remove adhesive strips completely unless your health care provider tells you to do that.  Check your wound area every day for signs of infection. Check for: ? Redness, swelling, or pain. ? Fluid or blood. ? Warmth. ? Pus or a bad smell.  Ask your health care provider if you should clean the wound with mild soap and water. Doing this may include: ? Using a clean towel to pat the wound dry after cleaning it. Do not rub or scrub the wound. ? Applying a cream or ointment. Do this only as told by your health care provider. ? Covering the incision with a clean dressing.  Ask your health care provider when you can leave the wound uncovered.  Do not take baths, swim, use a hot tub, or do anything that would put the wound underwater for one week. You may take  showers.   General instructions  Return to your normal activities as told by your health care provider. Ask your health care provider what activities are safe.  Do not scratch or pick at the wound.  Do not use any products that contain nicotine or tobacco, such as cigarettes and e-cigarettes. These may delay wound healing. If you need help quitting, ask your health care provider.  Keep all follow-up visits as told by your health care provider. This is important.  Eat a diet that includes protein, vitamin A, vitamin C, and other nutrient-rich foods to help the wound heal. ? Foods rich in protein include meat, dairy, beans, nuts, and other sources. ? Foods rich in vitamin A include carrots and dark green, leafy vegetables. ? Foods rich in vitamin C include citrus, tomatoes, and other fruits and vegetables. ? Nutrient-rich foods have protein, carbohydrates, fat, vitamins, or minerals. Eat a variety of healthy foods including vegetables, fruits, and whole grains. Contact a health care provider if:  You received a tetanus shot and you have swelling, severe pain, redness, or bleeding at the injection site.  Your pain is not controlled with medicine.  You have redness, swelling, or pain around the wound.  You have fluid or blood coming from the wound.  Your wound feels warm to the touch.  You have pus or a bad smell coming from the wound.  You have a fever or chills.  You are  nauseous or you vomit.  You are dizzy. Get help right away if:  You have a red streak going away from your wound.  The edges of the wound open up and separate.  Your wound is bleeding, and the bleeding does not stop with gentle pressure.  You have a rash.  You faint.  You have trouble breathing. Summary  Always wash your hands with soap and water before changing your bandage (dressing).  To help with healing, eat foods that are rich in protein, vitamin A, vitamin C, and other nutrients.  Check  your wound every day for signs of infection. Contact your health care provider if you suspect that your wound is infected. This information is not intended to replace advice given to you by your health care provider. Make sure you discuss any questions you have with your health care provider. Document Released: 10/12/2007 Document Revised: 04/22/2018 Document Reviewed: 07/20/2015 Elsevier Patient Education  2020 [employer].    If you need a refill on your cardiac medications before your next appointment, please call your pharmacy.

## 2019-05-03 ENCOUNTER — Emergency Department (HOSPITAL_COMMUNITY): Payer: Medicare (Managed Care)

## 2019-05-03 ENCOUNTER — Other Ambulatory Visit: Payer: Self-pay

## 2019-05-03 ENCOUNTER — Encounter (HOSPITAL_COMMUNITY): Payer: Self-pay | Admitting: Emergency Medicine

## 2019-05-03 ENCOUNTER — Observation Stay (HOSPITAL_COMMUNITY)
Admission: EM | Admit: 2019-05-03 | Discharge: 2019-05-04 | Disposition: A | Discharge status: Home / Self Care | Payer: Medicare (Managed Care) | Attending: Internal Medicine | Admitting: Internal Medicine

## 2019-05-03 DIAGNOSIS — N1832 Chronic kidney disease, stage 3b: Secondary | ICD-10-CM | POA: Diagnosis not present

## 2019-05-03 DIAGNOSIS — F329 Major depressive disorder, single episode, unspecified: Secondary | ICD-10-CM

## 2019-05-03 DIAGNOSIS — I10 Essential (primary) hypertension: Secondary | ICD-10-CM | POA: Diagnosis present

## 2019-05-03 DIAGNOSIS — D638 Anemia in other chronic diseases classified elsewhere: Secondary | ICD-10-CM | POA: Diagnosis not present

## 2019-05-03 DIAGNOSIS — E1122 Type 2 diabetes mellitus with diabetic chronic kidney disease: Secondary | ICD-10-CM | POA: Insufficient documentation

## 2019-05-03 DIAGNOSIS — R109 Unspecified abdominal pain: Principal | ICD-10-CM | POA: Insufficient documentation

## 2019-05-03 DIAGNOSIS — R1030 Lower abdominal pain, unspecified: Secondary | ICD-10-CM | POA: Diagnosis not present

## 2019-05-03 DIAGNOSIS — R112 Nausea with vomiting, unspecified: Secondary | ICD-10-CM | POA: Diagnosis not present

## 2019-05-03 DIAGNOSIS — R188 Other ascites: Secondary | ICD-10-CM

## 2019-05-03 DIAGNOSIS — I130 Hypertensive heart and chronic kidney disease with heart failure and stage 1 through stage 4 chronic kidney disease, or unspecified chronic kidney disease: Secondary | ICD-10-CM | POA: Diagnosis not present

## 2019-05-03 DIAGNOSIS — D631 Anemia in chronic kidney disease: Secondary | ICD-10-CM | POA: Insufficient documentation

## 2019-05-03 DIAGNOSIS — Z8673 Personal history of transient ischemic attack (TIA), and cerebral infarction without residual deficits: Secondary | ICD-10-CM | POA: Diagnosis not present

## 2019-05-03 DIAGNOSIS — Z7901 Long term (current) use of anticoagulants: Secondary | ICD-10-CM | POA: Insufficient documentation

## 2019-05-03 DIAGNOSIS — I480 Paroxysmal atrial fibrillation: Secondary | ICD-10-CM | POA: Diagnosis not present

## 2019-05-03 DIAGNOSIS — N183 Chronic kidney disease, stage 3 unspecified: Secondary | ICD-10-CM | POA: Diagnosis present

## 2019-05-03 DIAGNOSIS — I5033 Acute on chronic diastolic (congestive) heart failure: Secondary | ICD-10-CM | POA: Insufficient documentation

## 2019-05-03 DIAGNOSIS — E039 Hypothyroidism, unspecified: Secondary | ICD-10-CM | POA: Diagnosis not present

## 2019-05-03 DIAGNOSIS — E782 Mixed hyperlipidemia: Secondary | ICD-10-CM

## 2019-05-03 DIAGNOSIS — E785 Hyperlipidemia, unspecified: Secondary | ICD-10-CM | POA: Insufficient documentation

## 2019-05-03 DIAGNOSIS — E1121 Type 2 diabetes mellitus with diabetic nephropathy: Secondary | ICD-10-CM | POA: Diagnosis not present

## 2019-05-03 DIAGNOSIS — F32A Depression, unspecified: Secondary | ICD-10-CM | POA: Diagnosis present

## 2019-05-03 DIAGNOSIS — Z20822 Contact with and (suspected) exposure to covid-19: Secondary | ICD-10-CM | POA: Insufficient documentation

## 2019-05-03 DIAGNOSIS — I5032 Chronic diastolic (congestive) heart failure: Secondary | ICD-10-CM

## 2019-05-03 LAB — LIPASE, BLOOD: Lipase: 21 U/L (ref 11–51)

## 2019-05-03 LAB — TSH: TSH: 3.18 u[IU]/mL (ref 0.350–4.500)

## 2019-05-03 LAB — BRAIN NATRIURETIC PEPTIDE: B Natriuretic Peptide: 499 pg/mL — ABNORMAL HIGH (ref 0.0–100.0)

## 2019-05-03 LAB — GLUCOSE, CAPILLARY
Glucose-Capillary: 156 mg/dL — ABNORMAL HIGH (ref 70–99)
Glucose-Capillary: 147 mg/dL — ABNORMAL HIGH (ref 70–99)
Glucose-Capillary: 167 mg/dL — ABNORMAL HIGH (ref 70–99)

## 2019-05-03 LAB — BASIC METABOLIC PANEL
Anion gap: 6 (ref 5–15)
BUN: 30 mg/dL — ABNORMAL HIGH (ref 8–23)
CO2: 17 mmol/L — ABNORMAL LOW (ref 22–32)
Calcium: 8.5 mg/dL — ABNORMAL LOW (ref 8.9–10.3)
Chloride: 113 mmol/L — ABNORMAL HIGH (ref 98–111)
Creatinine, Ser: 1.87 mg/dL — ABNORMAL HIGH (ref 0.44–1.00)
GFR calc Af Amer: 29 mL/min — ABNORMAL LOW (ref >60)
GFR calc non Af Amer: 25 mL/min — ABNORMAL LOW (ref >60)
Glucose, Bld: 223 mg/dL — ABNORMAL HIGH (ref 70–99)
Potassium: 4.5 mmol/L (ref 3.5–5.1)
Sodium: 136 mmol/L (ref 135–145)

## 2019-05-03 LAB — CBC
HCT: 29.2 % — ABNORMAL LOW (ref 36.0–46.0)
Hemoglobin: 8.9 g/dL — ABNORMAL LOW (ref 12.0–15.0)
MCH: 27.6 pg (ref 26.0–34.0)
MCHC: 30.5 g/dL (ref 30.0–36.0)
MCV: 90.4 fL (ref 80.0–100.0)
Platelets: 362 10*3/uL (ref 150–400)
RBC: 3.23 MIL/uL — ABNORMAL LOW (ref 3.87–5.11)
RDW: 19.3 % — ABNORMAL HIGH (ref 11.5–15.5)
WBC: 9.4 10*3/uL (ref 4.0–10.5)
nRBC: 0 % (ref 0.0–0.2)

## 2019-05-03 LAB — HEPATIC FUNCTION PANEL
ALT: 16 U/L (ref 0–44)
AST: 20 U/L (ref 15–41)
Albumin: 3.7 g/dL (ref 3.5–5.0)
Alkaline Phosphatase: 54 U/L (ref 38–126)
Bilirubin, Direct: 0.1 mg/dL (ref 0.0–0.2)
Indirect Bilirubin: 0.5 mg/dL (ref 0.3–0.9)
Total Bilirubin: 0.6 mg/dL (ref 0.3–1.2)
Total Protein: 7.3 g/dL (ref 6.5–8.1)

## 2019-05-03 LAB — SARS CORONAVIRUS 2 (TAT 6-24 HRS): SARS Coronavirus 2: NEGATIVE

## 2019-05-03 LAB — HEMOGLOBIN A1C
Hgb A1c MFr Bld: 6.8 % — ABNORMAL HIGH (ref 4.8–5.6)
Mean Plasma Glucose: 148.46 mg/dL

## 2019-05-03 LAB — TROPONIN I (HIGH SENSITIVITY)
Troponin I (High Sensitivity): 9 ng/L (ref <18)
Troponin I (High Sensitivity): 8 ng/L (ref <18)

## 2019-05-03 LAB — MAGNESIUM: Magnesium: 2.5 mg/dL — ABNORMAL HIGH (ref 1.7–2.4)

## 2019-05-03 LAB — PHOSPHORUS: Phosphorus: 4.1 mg/dL (ref 2.5–4.6)

## 2019-05-03 LAB — LACTIC ACID, PLASMA: Lactic Acid, Venous: 0.6 mmol/L (ref 0.5–1.9)

## 2019-05-03 MED ORDER — ONDANSETRON HCL 4 MG/2ML IJ SOLN
4.0000 mg | Freq: Once | INTRAMUSCULAR | Status: AC
  Administered 2019-05-03: 04:00:00 4 mg via INTRAVENOUS
  Filled 2019-05-03: qty 2

## 2019-05-03 MED ORDER — LEVOTHYROXINE SODIUM 100 MCG PO TABS
100.0000 ug | ORAL_TABLET | Freq: Every day | ORAL | Status: DC
  Administered 2019-05-03 – 2019-05-04 (×2): 100 ug via ORAL
  Filled 2019-05-03 (×2): qty 1

## 2019-05-03 MED ORDER — FENTANYL CITRATE (PF) 100 MCG/2ML IJ SOLN
50.0000 ug | Freq: Once | INTRAMUSCULAR | Status: AC
  Administered 2019-05-03: 50 ug via INTRAVENOUS
  Filled 2019-05-03: qty 2

## 2019-05-03 MED ORDER — ISOSORBIDE DINITRATE 20 MG PO TABS
30.0000 mg | ORAL_TABLET | Freq: Three times a day (TID) | ORAL | Status: DC
  Administered 2019-05-03 – 2019-05-04 (×4): 30 mg via ORAL
  Filled 2019-05-03: qty 1
  Filled 2019-05-03: qty 2
  Filled 2019-05-03: qty 1
  Filled 2019-05-03 (×2): qty 2
  Filled 2019-05-03 (×5): qty 1
  Filled 2019-05-03: qty 2

## 2019-05-03 MED ORDER — ACETAMINOPHEN 650 MG RE SUPP: 650.0000 mg | Freq: Four times a day (QID) | RECTAL | Status: DC | PRN

## 2019-05-03 MED ORDER — SODIUM BICARBONATE 650 MG PO TABS
1300.0000 mg | ORAL_TABLET | Freq: Two times a day (BID) | ORAL | Status: DC
  Administered 2019-05-03 – 2019-05-04 (×3): 1300 mg via ORAL
  Filled 2019-05-03 (×8): qty 2

## 2019-05-03 MED ORDER — INSULIN ASPART 100 UNIT/ML ~~LOC~~ SOLN: 0.0000 [IU] | Freq: Every day | SUBCUTANEOUS | Status: DC

## 2019-05-03 MED ORDER — SODIUM CHLORIDE 0.9 % IV SOLN: INTRAVENOUS | Status: AC

## 2019-05-03 MED ORDER — PANTOPRAZOLE SODIUM 40 MG PO TBEC
40.0000 mg | DELAYED_RELEASE_TABLET | Freq: Every day | ORAL | Status: DC
  Administered 2019-05-03 – 2019-05-04 (×2): 40 mg via ORAL
  Filled 2019-05-03 (×2): qty 1

## 2019-05-03 MED ORDER — ESCITALOPRAM OXALATE 10 MG PO TABS
10.0000 mg | ORAL_TABLET | Freq: Every day | ORAL | Status: DC
  Administered 2019-05-03 – 2019-05-04 (×2): 10 mg via ORAL
  Filled 2019-05-03 (×2): qty 1

## 2019-05-03 MED ORDER — APIXABAN 2.5 MG PO TABS
2.5000 mg | ORAL_TABLET | Freq: Two times a day (BID) | ORAL | Status: DC
  Administered 2019-05-03 – 2019-05-04 (×3): 2.5 mg via ORAL
  Filled 2019-05-03 (×8): qty 1

## 2019-05-03 MED ORDER — ONDANSETRON HCL 4 MG/2ML IJ SOLN: 4.0000 mg | Freq: Four times a day (QID) | INTRAMUSCULAR | Status: DC | PRN

## 2019-05-03 MED ORDER — ONDANSETRON HCL 4 MG/2ML IJ SOLN
4.0000 mg | Freq: Once | INTRAMUSCULAR | Status: AC
  Administered 2019-05-03: 4 mg via INTRAVENOUS
  Filled 2019-05-03: qty 2

## 2019-05-03 MED ORDER — HYDRALAZINE HCL 25 MG PO TABS
100.0000 mg | ORAL_TABLET | Freq: Three times a day (TID) | ORAL | Status: DC
  Administered 2019-05-03 – 2019-05-04 (×4): 100 mg via ORAL
  Filled 2019-05-03 (×4): qty 4

## 2019-05-03 MED ORDER — ATORVASTATIN CALCIUM 40 MG PO TABS
80.0000 mg | ORAL_TABLET | Freq: Every evening | ORAL | Status: DC
  Administered 2019-05-03: 17:00:00 80 mg via ORAL
  Filled 2019-05-03: qty 2

## 2019-05-03 MED ORDER — FENTANYL CITRATE (PF) 100 MCG/2ML IJ SOLN
50.0000 ug | Freq: Once | INTRAMUSCULAR | Status: AC
  Administered 2019-05-03: 04:00:00 50 ug via INTRAVENOUS
  Filled 2019-05-03: qty 2

## 2019-05-03 MED ORDER — GABAPENTIN 300 MG PO CAPS
300.0000 mg | ORAL_CAPSULE | Freq: Every day | ORAL | Status: DC
  Administered 2019-05-03 – 2019-05-04 (×2): 300 mg via ORAL
  Filled 2019-05-03 (×2): qty 1

## 2019-05-03 MED ORDER — SODIUM CHLORIDE 0.9 % IV BOLUS
1000.0000 mL | Freq: Once | INTRAVENOUS | Status: AC
  Administered 2019-05-03: 05:00:00 1000 mL via INTRAVENOUS

## 2019-05-03 MED ORDER — INSULIN ASPART 100 UNIT/ML ~~LOC~~ SOLN
0.0000 [IU] | Freq: Three times a day (TID) | SUBCUTANEOUS | Status: DC
  Administered 2019-05-03: 13:00:00 2 [IU] via SUBCUTANEOUS
  Administered 2019-05-03: 17:00:00 1 [IU] via SUBCUTANEOUS
  Administered 2019-05-04: 12:00:00 2 [IU] via SUBCUTANEOUS
  Administered 2019-05-04: 08:00:00 1 [IU] via SUBCUTANEOUS

## 2019-05-03 MED ORDER — HYDROCODONE-ACETAMINOPHEN 5-325 MG PO TABS
1.0000 | ORAL_TABLET | Freq: Four times a day (QID) | ORAL | Status: DC | PRN
  Administered 2019-05-03: 20:00:00 2 via ORAL
  Filled 2019-05-03: qty 2

## 2019-05-03 MED ORDER — ACETAMINOPHEN 325 MG PO TABS
650.0000 mg | ORAL_TABLET | Freq: Four times a day (QID) | ORAL | Status: DC | PRN
  Administered 2019-05-03: 10:00:00 650 mg via ORAL
  Filled 2019-05-03: qty 2

## 2019-05-03 NOTE — ED Notes (Signed)
Pt aware of need for urine specimen. 

## 2019-05-03 NOTE — ED Triage Notes (Signed)
Patient states abdominal pain tonight. Patient has had several episodes of vomiting over the last 2 days. Patient states that this started when she just started her duo neb treatments at home 2 days ago. Patient states no appetite and the pain is in the center of her abdomen.

## 2019-05-03 NOTE — H&P (Signed)
History and Physical    [name] IRC:[id number] DOB: 07/05/1940 DOA: 05/03/2019  PCP: Kendrick Ranch, MD  Patient coming from: Home  Chief Complaint: Abdominal pain, nausea vomiting.  HPI: Beth Padilla is a 79 y.o. female with medical history significant of chronic diastolic heart failure, paroxysmal atrial fibrillation (on Eliquis), type 2 diabetes with nephropathy, hypothyroidism, chronic kidney disease stage IIIb, hypertension and hyperlipidemia; who presented to the emergency department secondary to abdominal pain, nausea and vomiting.  Patient's pain localized in her periumbilical area and mid abdomen, patient expressed pain intensity today 6-7 out of 10, had not experienced this type of pain before, reports no aggravating factors; associated with nonbloody vomiting and nausea; symptoms have been present for the last 2 days prior to admission rotation progressing to the point of making her unable to maintain adequate nutrition/hydration and take medications.  Patient denies chest pain, shortness of breath, fever, chills, hematemesis, melena, hematochezia, dysuria, hematuria, headaches or focal weakness.  ED Course: Patient received antiemetics, fluid resuscitation and IV fentanyl; blood work and urinalysis has been ordered.  CT scan demonstrating no acute intra-abdominal abnormalities (see below for full report).  Given the still ongoing nausea and concerns for dehydration/further decompensation without medical attention, TRH consulted to place patient in observation for further evaluation and management.  Review of Systems: As per HPI otherwise all other systems reviewed and are negative.  Past Medical History:  Diagnosis Date  . Anemia   . Atrial fibrillation ([place])   . Depression   . Diabetes mellitus   . Hyperlipidemia 12/15/2010  . Hypertension   . Hypothyroidism 12/15/2010  . Pneumonia   . Renal disorder     Past Surgical History:  Procedure  Laterality Date  . ABDOMINAL HYSTERECTOMY    . EP IMPLANTABLE DEVICE N/A 07/21/2014   Procedure: Loop Recorder Insertion;  Surgeon: [name], MD;  Location: [place] [facility];  Service: Cardiovascular;  Laterality: N/A;  . implantable loop recorder removal  10/14/2018   MDT Reveal LINQ removed in office by Dr [name]  . TEE WITHOUT CARDIOVERSION N/A 07/21/2014   Procedure: TRANSESOPHAGEAL ECHOCARDIOGRAM (TEE);  Surgeon: [name], MD;  Location: [facility] ENDOSCOPY;  Service: Cardiovascular;  Laterality: N/A;    Social History  reports that she has never smoked. She has never used smokeless tobacco. She reports that she does not drink alcohol or use drugs.  Allergies  Allergen Reactions  . Ampicillin Rash    Family History  Problem Relation Age of Onset  . Stroke Mother   . Cancer Sister     Prior to Admission medications   Medication Sig Start Date End Date Taking? Authorizing Provider  acetaminophen (TYLENOL) 650 MG CR tablet Take 650 mg by mouth every 8 (eight) hours as needed for pain.    [provider]  acetaZOLAMIDE (DIAMOX) 250 MG tablet Take 1 tablet by mouth daily. 11/30/17   [provider]  amLODipine (NORVASC) 10 MG tablet Take 5 mg by mouth daily. 06/18/18   [provider]  apixaban (ELIQUIS) 2.5 MG TABS tablet Take 2.5 mg by mouth 2 (two) times daily.    [provider]  atorvastatin (LIPITOR) 80 MG tablet Take 1 tablet (80 mg total) by mouth every evening. 07/22/14   [name], MD  calcium acetate (PHOSLO) 667 MG capsule Take 1 capsule by mouth 2 (two) times a week. Mondays and tuursdays 11/29/17   [provider]  escitalopram (LEXAPRO) 10 MG tablet Take 10 mg  by mouth daily.    [provider]  ferrous sulfate 325 (65 FE) MG tablet Take 1 tablet by mouth 2 (two) times daily. 11/09/17   [provider]  furosemide (LASIX) 20 MG tablet Take 1 tablet (20 mg total) by mouth daily. 02/15/18   Orson Eva, MD    gabapentin (NEURONTIN) 300 MG capsule Take 1 capsule by mouth daily. 01/03/18   [provider]  hydrALAZINE (APRESOLINE) 100 MG tablet Take 100 mg by mouth 3 (three) times daily.    [provider]  isosorbide dinitrate (ISORDIL) 30 MG tablet Take 30 mg by mouth 3 (three) times daily.  09/18/15   [provider]  levothyroxine (SYNTHROID, LEVOTHROID) 100 MCG tablet Take 100 mcg by mouth daily before breakfast.    [provider]  omeprazole (PRILOSEC) 20 MG capsule Take 20 mg by mouth daily.    [provider]  sodium bicarbonate 650 MG tablet Take 1,300 mg by mouth 2 (two) times daily.    [provider]  valsartan (DIOVAN) 80 MG tablet Take 1 tablet by mouth daily. 12/31/17   [provider]  Vitamins A & D (VITAMIN A & D) ointment Apply 1 application topically as needed. 11/09/17   [provider]    Physical Exam: Vitals:   05/03/19 0630 05/03/19 0700 05/03/19 0730 05/03/19 0800  BP: (!) 182/112 (!) 167/64 (!) 152/59 (!) 171/76  Pulse: 84 84 85 85  Resp: 17 15 16 17   Temp:      TempSrc:      SpO2: 96% 93% 94% 98%  Weight:      Height:        Constitutional: NAD, calm, comfortable Vitals:   05/03/19 0630 05/03/19 0700 05/03/19 0730 05/03/19 0800  BP: (!) 182/112 (!) 167/64 (!) 152/59 (!) 171/76  Pulse: 84 84 85 85  Resp: 17 15 16 17   Temp:      TempSrc:      SpO2: 96% 93% 94% 98%  Weight:      Height:       Eyes: PERRL, lids and conjunctivae normal, no icterus, no nystagmus. ENMT: Mucous membranes are slightly dry on examination.  Posterior pharynx clear of any exudate or lesions. No Thrush  Neck: normal, supple, no masses, no thyromegaly, no JVD on exam. Respiratory: clear to auscultation bilaterally, no wheezing, no crackles. Normal respiratory effort. No accessory muscle use.  Good oxygen saturation on room air. Cardiovascular: Irregular rhythm, rate controlled; positive systolic ejection murmur, no  rubs, no gallops, no lower extremity swelling. Abdomen: vague periumbilical/mid abd tenderness on palpation, no masses palpated. No no guarding; no fluids or ascites appreciated. Bowel sounds positive.  Musculoskeletal: no clubbing / cyanosis. No joint deformity upper and lower extremities. Good ROM, no contractures. Normal muscle tone.  Skin: no rashes, lesions, ulcers. No induration Neurologic: CN 2-12 grossly intact. Sensation intact, DTR normal. Strength 5/5 in all 4.  Psychiatric: Normal judgment and insight. Alert and oriented x 3. Normal mood.   Labs on Admission: I have personally reviewed following labs and imaging studies  CBC: Recent Labs  Lab 05/03/19 0355  WBC 9.4  HGB 8.9*  HCT 29.2*  MCV 90.4  PLT 893    Basic Metabolic Panel: Recent Labs  Lab 05/03/19 0355  NA 136  K 4.5  CL 113*  CO2 17*  GLUCOSE 223*  BUN 30*  CREATININE 1.87*  CALCIUM 8.5*    GFR: Estimated Creatinine Clearance: 26 mL/min (A) (by C-G  formula based on SCr of 1.87 mg/dL (H)).  Liver Function Tests: Recent Labs  Lab 05/03/19 0356  AST 20  ALT 16  ALKPHOS 54  BILITOT 0.6  PROT 7.3  ALBUMIN 3.7    Urine analysis:    Component Value Date/Time   COLORURINE STRAW (A) 02/13/2018 1705   APPEARANCEUR CLEAR 02/13/2018 1705   LABSPEC 1.006 02/13/2018 1705   PHURINE 7.0 02/13/2018 [place] 02/13/2018 1705   HGBUR NEGATIVE 02/13/2018 1705   [place] 02/13/2018 1705   [place] 02/13/2018 1705   PROTEINUR 30 (A) 02/13/2018 1705   UROBILINOGEN 0.2 07/18/2014 2120   NITRITE NEGATIVE 02/13/2018 1705   LEUKOCYTESUR SMALL (A) 02/13/2018 1705    Radiological Exams on Admission: CT ABDOMEN PELVIS WO CONTRAST  Result Date: 05/03/2019 CLINICAL DATA:  Abdominal pain and vomiting EXAM: CT ABDOMEN AND PELVIS WITHOUT CONTRAST TECHNIQUE: Multidetector CT imaging of the abdomen and pelvis was performed following the standard protocol without IV contrast.  COMPARISON:  None. FINDINGS: LOWER CHEST: Bilateral lower lobe bronchiectasis. Multifocal bibasilar opacity. Mild cardiomegaly. HEPATOBILIARY: Diffusely nodular hepatic contours with relative hypertrophy of the caudate and left hepatic lobe, consistent with hepatic cirrhosis. No focal liver lesion. No biliary dilatation. Small volume perihepatic ascites. Normal gallbladder. PANCREAS: Normal pancreas. No ductal dilatation or peripancreatic fluid collection. SPLEEN: Normal.  Small amount of perisplenic ascites. ADRENALS/URINARY TRACT: The adrenal glands are normal. Lobulated contours of the kidneys. Intermediate attenuation exophytic structure of the right interpolar kidney is indeterminate and measures 1.6 cm. The urinary bladder is normal for degree of distention STOMACH/BOWEL: There is no hiatal hernia. Normal duodenal course and caliber. No small bowel dilatation or inflammation. No focal colonic abnormality. The appendix is not visualized. No right lower quadrant inflammation or free fluid. VASCULAR/LYMPHATIC: There is calcific atherosclerosis of the abdominal aorta. No abdominal or pelvic lymphadenopathy. REPRODUCTIVE: Status post hysterectomy. No adnexal mass. MUSCULOSKELETAL. Multilevel degenerative disc disease and facet arthrosis. No bony spinal canal stenosis. OTHER: None. IMPRESSION: 1. No acute abnormality of the abdomen or pelvis. 2. Hepatic cirrhosis and small volume perihepatic and perisplenic ascites. 3. Bilateral lower lobe bronchiectasis and multifocal bibasilar pulmonary opacity. 4. Indeterminate intermediate density right renal lesion, possibly hemorrhagic/proteinaceous cyst. This could be further evaluated with renal ultrasound on a non-emergent basis. 5. Aortic Atherosclerosis (ICD10-I70.0). Electronically Signed   By: Ulyses Jarred M.D.   On: 05/03/2019 06:12   DG Chest Portable 1 View  Result Date: 05/03/2019 CLINICAL DATA:  79 year old female with a history of cough EXAM: PORTABLE CHEST 1  VIEW COMPARISON:  02/13/2018, CT 02/13/2018 FINDINGS: Cardiomediastinal silhouette unchanged in size and contour. Calcifications of the aortic arch. Interval removal of the loop recorder. Similar appearance of mixed interstitial and airspace opacity at the bilateral lung bases greater on the right. Thickening of the minor fissure is similar to the comparison. No pneumothorax or pleural effusion. No new confluent airspace disease. Aeration on the left is improved compared to the prior. No interlobular septal thickening. IMPRESSION: Slight improved aeration compared to the prior chest x-ray with ongoing mixed interstitial and airspace disease at the lung bases favored to be chronic scarring/volume loss. Early superimposed infection difficult to exclude. Interval removal of the loop recorder. Electronically Signed   By: Corrie Mckusick D.O.   On: 05/03/2019 07:46    EKG: Independently reviewed. Chronic atrial fibrillation with rate controlled. Normal QT and mild right axis deviation appreciated. No signs of acute ischemic changes.  Assessment/Plan 1-abdominal pain/nausea and vomiting: With  concern for gastroparesis versus viral gastroenteritis. -CT scan abdomen and pelvis demonstrating no acute abnormalities that would explain patient's symptoms -Mild dehydration appreciated on blood work. -Still well in the ED feeling nauseated -Patient will be admitted to telemetry bed, provide gentle fluid resuscitation, antiemetics, PPIs and as needed analgesics. -Will follow clinical response. -Hopefully able to slowly advance diet in the next 24 hours with anticipated discharge back home.  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 -Will check A1c -While inpatient sliding scale insulin will be ordered.  3-essential HTN (hypertension) -Holding diuretics and also ARB acutely while providing fluid resuscitation and allowed renal function to further  improve. -Follow vital signs and adjust antihypertensive regimen as needed.  4-Hypothyroidism -will check TSH -Continue Synthroid; dose adjustment to be done as needed.  5-Hyperlipidemia -Continue statins  6-mild acute kidney injury on CKD (chronic kidney disease), stage IIIB -Holding nephrotoxic agents overnight -Providing fluid resuscitation -Drinking urinalysis -Follow renal function trend -Continue the use of sodium bicarbonate twice a day -Checking calcium, phosphorus and magnesium level.  7-Paroxysmal atrial fibrillation ([facility]) -Rate controlled -Continue Eliquis -Will monitor on telemetry overnight.  8-Chronic diastolic HF (heart failure) (H[facility]) -Appears stable and compensated -Last ejection fraction 60 to 65% -Follow daily weights strict I's and O's.  9-Anemia of chronic disease -Hemoglobin appears stable and at baseline for her -No signs of further bleeding -Resume oral iron supplementation at discharge.  10-Depression -No suicidal ideation or hallucinations -A stable mood. -Continue Lexapro.  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  12-CT scan findings suggesting hepatic cirrhosis -Normal LFTs -There is mild perihepatic and perisplenic ascites -No prior history of cirrhosis or liver disease. -Will need outpatient follow-up with gastroenterologist.   DVT prophylaxis: Chronically on eliquis.  Code Status:   Full code Family Communication:  No family at bedside Disposition Plan:   Patient is from:  Home   Anticipated DC to:  Home   Anticipated DC date:  05/04/19  Anticipated DC barriers: Failure to clinically improved.  Consults called:  None  Admission status:  Observation, telemetry bed.  Severity of Illness: Currently mild, but given underlying medical cormobidities can easily decompensates.     Barton Dubois MD [facility]  How to contact the [facility] Attending or Consulting  provider [place] or covering provider during after hours [place], for this patient?   1. Check the care team in [facility] and look for a) attending/consulting TRH provider listed and b) the [facility] team listed 2. Locate the [facility] provider you are looking for under [facility] and page to a number that you can be directly reached. 3. If you still have difficulty reaching the provider, please page the [facility] (Director on Call) for the Hospitalists listed on amion for assistance.  05/03/2019, 8:20 AM

## 2019-05-03 NOTE — ED Notes (Signed)
BP elevated 180/81. Previous reading 196/76. Pt states she normally takes her blood pressure medicine at night and in the morning. Pt states she did not take the medicine last night. EDP aware.

## 2019-05-03 NOTE — ED Provider Notes (Signed)
Beth Padilla   CSN: [id number] Arrival date & time: 05/03/19  0021     History Chief Complaint  Patient presents with  . Abdominal Pain    Beth Padilla is a 79 y.o. female.  Patient with history of CHF, diabetes, atrial fibrillation on Eliquis, CKD presenting with lower abdominal pain onset about 6 PM.  She describes diffuse lower abdominal pain that is progressively worsening.  She has been feeling ill for the past day with poor appetite and not eating or drinking much.  She had one episode of vomiting last night and one this morning.  Last vomit was yesterday and was normal.  States she is no longer passing gas.  States she has no appetite and no desire to eat or drink today.  Pain is in the center of her abdomen progressively worsening.  She is never had this pain before.  No chest pain.  No pain with urination or blood in the urine.  No vaginal bleeding or discharge.  Previous hysterectomy.  She still has appendix and gallbladder.  Denies any chest pain or shortness of breath. States that became pain became worse today after she vomited this morning.   Abdominal Pain Associated symptoms: nausea and vomiting   Associated symptoms: no cough, no dysuria, no fever, no hematuria and no shortness of breath        Past Medical History:  Diagnosis Date  . Anemia   . Atrial fibrillation ([place])   . Depression   . Diabetes mellitus   . Hyperlipidemia 12/15/2010  . Hypertension   . Hypothyroidism 12/15/2010  . Pneumonia   . Renal disorder     Patient Active Problem List   Diagnosis Date Noted  . Lobar pneumonia ([place]) 02/14/2018  . Acute on chronic diastolic CHF (congestive heart failure) ([place]) 02/13/2018  . Symptomatic anemia 02/13/2018  . Uncontrolled type 2 diabetes mellitus with hyperglycemia ([place]) 02/13/2018  . Acute respiratory failure with hypoxia ([place]) 02/13/2018  . Paroxysmal atrial fibrillation ([place]) 02/19/2015  . CVA (cerebral  infarction)   . Elevated troponin   . CKD (chronic kidney disease), stage III 07/19/2014  . Stroke ([place]) 07/18/2014  . Acute renal failure superimposed on stage 3 chronic kidney disease ([place]) 09/16/2013  . CHF (congestive heart failure) ([place]) 09/14/2013  . Bradycardia 09/14/2013  . UTI (urinary tract infection) 09/14/2013  . DM type 2 (diabetes mellitus, type 2) ([place]) 12/15/2010  . HTN (hypertension) 12/15/2010  . Anemia 12/15/2010  . Hypothyroidism 12/15/2010  . Hyperlipidemia 12/15/2010    Past Surgical History:  Procedure Laterality Date  . ABDOMINAL HYSTERECTOMY    . EP IMPLANTABLE DEVICE N/A 07/21/2014   Procedure: Loop Recorder Insertion;  Surgeon: [name], MD;  Location: [facility];  Service: Cardiovascular;  Laterality: N/A;  . implantable loop recorder removal  10/14/2018   MDT Reveal LINQ removed in office by Dr [name]  . TEE WITHOUT CARDIOVERSION N/A 07/21/2014   Procedure: TRANSESOPHAGEAL ECHOCARDIOGRAM (TEE);  Surgeon: [name], MD;  Location: [facility];  Service: Cardiovascular;  Laterality: N/A;     OB History   No obstetric history on file.     Family History  Problem Relation Age of Onset  . Stroke Mother   . Cancer Sister     Social History   Tobacco Use  . Smoking status: Never Smoker  . Smokeless tobacco: Never Used  Substance Use Topics  . Alcohol use: No  . Drug use: No  Home Medications Prior to Admission medications   Medication Sig Start Date End Date Taking? Authorizing Provider  acetaminophen (TYLENOL) 650 MG CR tablet Take 650 mg by mouth every 8 (eight) hours as needed for pain.    [provider]  acetaZOLAMIDE (DIAMOX) 250 MG tablet Take 1 tablet by mouth daily. 11/30/17   [provider]  amLODipine (NORVASC) 10 MG tablet Take 5 mg by mouth daily. 06/18/18   [provider]  apixaban (ELIQUIS) 2.5 MG TABS tablet Take 2.5 mg by mouth 2 (two) times daily.    [provider]    atorvastatin (LIPITOR) 80 MG tablet Take 1 tablet (80 mg total) by mouth every evening. 07/22/14   [name], MD  calcium acetate (PHOSLO) 667 MG capsule Take 1 capsule by mouth 2 (two) times a week. Mondays and tuursdays 11/29/17   [provider]  escitalopram (LEXAPRO) 10 MG tablet Take 10 mg by mouth daily.    [provider]  ferrous sulfate 325 (65 FE) MG tablet Take 1 tablet by mouth 2 (two) times daily. 11/09/17   [provider]  furosemide (LASIX) 20 MG tablet Take 1 tablet (20 mg total) by mouth daily. 02/15/18   Orson Eva, MD  gabapentin (NEURONTIN) 300 MG capsule Take 1 capsule by mouth daily. 01/03/18   [provider]  hydrALAZINE (APRESOLINE) 100 MG tablet Take 100 mg by mouth 3 (three) times daily.    [provider]  isosorbide dinitrate (ISORDIL) 30 MG tablet Take 30 mg by mouth 3 (three) times daily.  09/18/15   [provider]  levothyroxine (SYNTHROID, LEVOTHROID) 100 MCG tablet Take 100 mcg by mouth daily before breakfast.    [provider]  omeprazole (PRILOSEC) 20 MG capsule Take 20 mg by mouth daily.    [provider]  sodium bicarbonate 650 MG tablet Take 1,300 mg by mouth 2 (two) times daily.    [provider]  valsartan (DIOVAN) 80 MG tablet Take 1 tablet by mouth daily. 12/31/17   [provider]  Vitamins A & D (VITAMIN A & D) ointment Apply 1 application topically as needed. 11/09/17   [provider]    Allergies    Ampicillin  Review of Systems   Review of Systems  Constitutional: Positive for activity change and appetite change. Negative for fever.  HENT: Negative for congestion and rhinorrhea.   Eyes: Negative for visual disturbance.  Respiratory: Negative for cough, chest tightness and shortness of breath.   Gastrointestinal: Positive for abdominal pain, nausea and vomiting.  Genitourinary: Negative for dysuria and hematuria.  Musculoskeletal: Negative  for arthralgias and myalgias.  Skin: Negative for rash.  Neurological: Negative for dizziness, weakness and headaches.   all other systems are negative except as noted in the HPI and PMH.    Physical Exam Updated Vital Signs BP (!) 176/60 (BP Location: Left Arm)   Pulse 81   Temp 98.9 F (37.2 C) (Oral)   Resp 18   Ht 5\' 7"  (1.702 m)   Wt 76.7 kg   SpO2 100%   BMI 26.47 kg/m   Physical Exam Vitals and nursing Padilla reviewed.  Constitutional:      General: She is not in acute distress.    Appearance: She is well-developed.  HENT:     Head: Normocephalic and atraumatic.     Mouth/Throat:     Pharynx: No oropharyngeal exudate.  Eyes:     Conjunctiva/sclera: Conjunctivae normal.  Pupils: Pupils are equal, round, and reactive to light.  Neck:     Comments: No meningismus. Cardiovascular:     Rate and Rhythm: Normal rate. Rhythm irregular.     Heart sounds: Murmur present.  Pulmonary:     Effort: Pulmonary effort is normal. No respiratory distress.     Breath sounds: Normal breath sounds.  Abdominal:     Palpations: Abdomen is soft.     Tenderness: There is abdominal tenderness. There is no guarding or rebound.     Comments: Periumbilical tenderness with voluntary guarding, no right lower quadrant tenderness  Musculoskeletal:        General: No tenderness. Normal range of motion.     Cervical back: Normal range of motion and neck supple.     Comments: No CVA tenderness  Skin:    General: Skin is warm.  Neurological:     Mental Status: She is alert and oriented to person, place, and time.     Cranial Nerves: No cranial nerve deficit.     Motor: No abnormal muscle tone.     Coordination: Coordination normal.     Comments:  5/5 strength throughout. CN 2-12 intact.Equal grip strength.   Psychiatric:        Behavior: Behavior normal.     ED Results / Procedures / Treatments   Labs (all labs ordered are listed, but only abnormal results are displayed) Labs  Reviewed  CBC - Abnormal; Notable for the following components:      Result Value   RBC 3.23 (*)    Hemoglobin 8.9 (*)    HCT 29.2 (*)    RDW 19.3 (*)    All other components within normal limits  BASIC METABOLIC PANEL - Abnormal; Notable for the following components:   Chloride 113 (*)    CO2 17 (*)    Glucose, Bld 223 (*)    BUN 30 (*)    Creatinine, Ser 1.87 (*)    Calcium 8.5 (*)    GFR calc non Af Amer 25 (*)    GFR calc Af Amer 29 (*)    All other components within normal limits  BRAIN NATRIURETIC PEPTIDE - Abnormal; Notable for the following components:   B Natriuretic Peptide 499.0 (*)    All other components within normal limits  SARS CORONAVIRUS 2 (TAT 6-24 HRS)  LIPASE, BLOOD  LACTIC ACID, PLASMA  HEPATIC FUNCTION PANEL  URINALYSIS, ROUTINE W REFLEX MICROSCOPIC  LACTIC ACID, PLASMA  PHOSPHORUS  MAGNESIUM  TSH  HEMOGLOBIN A1C  TROPONIN I (HIGH SENSITIVITY)  TROPONIN I (HIGH SENSITIVITY)    EKG EKG Interpretation  Date/Time:  Saturday May 03 2019 01:35:44 EDT Ventricular Rate:  77 PR Interval:    QRS Duration: 88 QT Interval:  349 QTC Calculation: 395 R Axis:   91 Text Interpretation: Atrial fibrillation Ventricular premature complex Right axis deviation Consider left ventricular hypertrophy Abnormal T, consider ischemia, diffuse leads Nonspecific T wave abnormality Confirmed by Ezequiel Essex [phone]) on 05/03/2019 1:46:15 AM   Radiology CT ABDOMEN PELVIS WO CONTRAST  Result Date: 05/03/2019 CLINICAL DATA:  Abdominal pain and vomiting EXAM: CT ABDOMEN AND PELVIS WITHOUT CONTRAST TECHNIQUE: Multidetector CT imaging of the abdomen and pelvis was performed following the standard protocol without IV contrast. COMPARISON:  None. FINDINGS: LOWER CHEST: Bilateral lower lobe bronchiectasis. Multifocal bibasilar opacity. Mild cardiomegaly. HEPATOBILIARY: Diffusely nodular hepatic contours with relative hypertrophy of the caudate and left hepatic lobe, consistent  with hepatic cirrhosis. No focal liver lesion. No biliary  dilatation. Small volume perihepatic ascites. Normal gallbladder. PANCREAS: Normal pancreas. No ductal dilatation or peripancreatic fluid collection. SPLEEN: Normal.  Small amount of perisplenic ascites. ADRENALS/URINARY TRACT: The adrenal glands are normal. Lobulated contours of the kidneys. Intermediate attenuation exophytic structure of the right interpolar kidney is indeterminate and measures 1.6 cm. The urinary bladder is normal for degree of distention STOMACH/BOWEL: There is no hiatal hernia. Normal duodenal course and caliber. No small bowel dilatation or inflammation. No focal colonic abnormality. The appendix is not visualized. No right lower quadrant inflammation or free fluid. VASCULAR/LYMPHATIC: There is calcific atherosclerosis of the abdominal aorta. No abdominal or pelvic lymphadenopathy. REPRODUCTIVE: Status post hysterectomy. No adnexal mass. MUSCULOSKELETAL. Multilevel degenerative disc disease and facet arthrosis. No bony spinal canal stenosis. OTHER: None. IMPRESSION: 1. No acute abnormality of the abdomen or pelvis. 2. Hepatic cirrhosis and small volume perihepatic and perisplenic ascites. 3. Bilateral lower lobe bronchiectasis and multifocal bibasilar pulmonary opacity. 4. Indeterminate intermediate density right renal lesion, possibly hemorrhagic/proteinaceous cyst. This could be further evaluated with renal ultrasound on a non-emergent basis. 5. Aortic Atherosclerosis (ICD10-I70.0). Electronically Signed   By: Ulyses Jarred M.D.   On: 05/03/2019 06:12   DG Chest Portable 1 View  Result Date: 05/03/2019 CLINICAL DATA:  79 year old female with a history of cough EXAM: PORTABLE CHEST 1 VIEW COMPARISON:  02/13/2018, CT 02/13/2018 FINDINGS: Cardiomediastinal silhouette unchanged in size and contour. Calcifications of the aortic arch. Interval removal of the loop recorder. Similar appearance of mixed interstitial and airspace opacity at  the bilateral lung bases greater on the right. Thickening of the minor fissure is similar to the comparison. No pneumothorax or pleural effusion. No new confluent airspace disease. Aeration on the left is improved compared to the prior. No interlobular septal thickening. IMPRESSION: Slight improved aeration compared to the prior chest x-ray with ongoing mixed interstitial and airspace disease at the lung bases favored to be chronic scarring/volume loss. Early superimposed infection difficult to exclude. Interval removal of the loop recorder. Electronically Signed   By: Corrie Mckusick D.O.   On: 05/03/2019 07:46    Procedures Procedures (including critical care time)  Medications Ordered in ED Medications  ondansetron (ZOFRAN) injection 4 mg (has no administration in time range)    ED Course  I have reviewed the triage vital signs and the nursing notes.  Pertinent labs & imaging results that were available during my care of the patient were reviewed by me and considered in my medical decision making (see chart for details).    MDM Rules/Calculators/A&P                     Anorexia with nausea and vomiting and lower abdominal pain.  Vitals stable, no distress, no fever  IVF and symptom control given. Labs with stable anemia and CKD. CT to be done without contrast. UA pending. CT without acute pathology. Does show cirrhosis and small volume ascites without history of liver disease.  No fever. Low suspicion for SBP. Unclear whether ascites contributing to abdominal pain. UA Pending.  Pulmonary findings on imaging appear to be chronic. No cough or fever. No SOB.   Unclear etiology of abdominal pain. Pain and nausea persist but no vomiting. With ongoing symptoms, plan for observation admission. Ascites does not appear adequate to safely aspirate at this time.  D/w Dr. Olevia Bowens. Final Clinical Impression(s) / ED Diagnoses Final diagnoses:  Lower abdominal pain  Other ascites    Rx / DC  Orders ED Discharge Orders  None       Ezequiel Essex, MD 05/03/19 [phone]

## 2019-05-04 DIAGNOSIS — R1030 Lower abdominal pain, unspecified: Secondary | ICD-10-CM | POA: Diagnosis not present

## 2019-05-04 DIAGNOSIS — E1121 Type 2 diabetes mellitus with diabetic nephropathy: Secondary | ICD-10-CM

## 2019-05-04 DIAGNOSIS — I5032 Chronic diastolic (congestive) heart failure: Secondary | ICD-10-CM | POA: Diagnosis not present

## 2019-05-04 DIAGNOSIS — R109 Unspecified abdominal pain: Secondary | ICD-10-CM | POA: Diagnosis not present

## 2019-05-04 DIAGNOSIS — I10 Essential (primary) hypertension: Secondary | ICD-10-CM

## 2019-05-04 DIAGNOSIS — D638 Anemia in other chronic diseases classified elsewhere: Secondary | ICD-10-CM | POA: Diagnosis not present

## 2019-05-04 DIAGNOSIS — N1832 Chronic kidney disease, stage 3b: Secondary | ICD-10-CM | POA: Diagnosis not present

## 2019-05-04 LAB — BASIC METABOLIC PANEL
Anion gap: 8 (ref 5–15)
BUN: 30 mg/dL — ABNORMAL HIGH (ref 8–23)
CO2: 18 mmol/L — ABNORMAL LOW (ref 22–32)
Calcium: 8.1 mg/dL — ABNORMAL LOW (ref 8.9–10.3)
Chloride: 113 mmol/L — ABNORMAL HIGH (ref 98–111)
Creatinine, Ser: 2.25 mg/dL — ABNORMAL HIGH (ref 0.44–1.00)
GFR calc Af Amer: 23 mL/min — ABNORMAL LOW (ref >60)
GFR calc non Af Amer: 20 mL/min — ABNORMAL LOW (ref >60)
Glucose, Bld: 140 mg/dL — ABNORMAL HIGH (ref 70–99)
Potassium: 4.3 mmol/L (ref 3.5–5.1)
Sodium: 139 mmol/L (ref 135–145)

## 2019-05-04 LAB — GLUCOSE, CAPILLARY
Glucose-Capillary: 127 mg/dL — ABNORMAL HIGH (ref 70–99)
Glucose-Capillary: 158 mg/dL — ABNORMAL HIGH (ref 70–99)

## 2019-05-04 NOTE — Discharge Summary (Signed)
Physician Discharge Summary  Beth Padilla ZWC:[id number] DOB: [birth year] DOA: 05/03/2019  PCP: Kendrick Ranch, MD  Admit date: 05/03/2019 Discharge date: 05/04/2019  Time spent: 35 minutes  Recommendations for Outpatient Follow-up:  1. Repeat renal function panel to reassess electrolytes and renal function 2. Continue outpatient follow-up with nephrology service. 3. Referral to gastroenterologist to further assess abnormal CT findings as mentioned below (there was appearance for cirrhosis, but no prior history of liver disease).   Discharge Diagnoses:  Principal Problem:   Abdominal pain Active Problems:   Type 2 diabetes with nephropathy (HCC)   HTN (hypertension)   Hypothyroidism   Hyperlipidemia   CKD (chronic kidney disease), stage III   Paroxysmal atrial fibrillation (HCC)   Chronic diastolic HF (heart failure) (HCC)   Anemia of chronic disease   Nausea & vomiting   Depression   Discharge Condition: Stable and improved.  Discharged home with instruction to follow-up with PCP in 10 days.  CODE STATUS: Full code  Diet recommendation: Low sodium and modified carbohydrate diet.  Filed Weights   05/03/19 0101 05/04/19 0428  Weight: 76.7 kg 76.6 kg    History of present illness:  79 y.o. female with medical history significant of chronic diastolic heart failure, paroxysmal atrial fibrillation (on Eliquis), type 2 diabetes with nephropathy, hypothyroidism, chronic kidney disease stage IIIb, hypertension and hyperlipidemia; who presented to the emergency department secondary to abdominal pain, nausea and vomiting.  Patient's pain localized in her periumbilical area and mid abdomen, patient expressed pain intensity today 6-7 out of 10, had not experienced this type of pain before, reports no aggravating factors; associated with nonbloody vomiting and nausea; symptoms have been present for the last 2 days prior to admission rotation progressing to the point of  making her unable to maintain adequate nutrition/hydration and take medications.  Patient denies chest pain, shortness of breath, fever, chills, hematemesis, melena, hematochezia, dysuria, hematuria, headaches or focal weakness.  ED Course: Patient received antiemetics, fluid resuscitation and IV fentanyl; blood work and urinalysis has been ordered.  CT scan demonstrating no acute intra-abdominal abnormalities (see below for full report).  Given the still ongoing nausea and concerns for dehydration/further decompensation without medical attention, TRH consulted to place patient in observation for further evaluation and management.  Hospital Course:  1-abdominal pain/nausea and vomiting: With concern for gastroparesis versus viral gastroenteritis. -CT scan abdomen and pelvis demonstrating no acute abnormalities that would explain patient's symptoms -Patient symptoms resolved after the use of analgesics and antiemetics. -After fluid resuscitation feeling better and now able to tolerate diet. -Instructed to follow-up with PCP in 10 days.  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  3-essential HTN (hypertension) -Holding diuretics and also ARB acutely while providing fluid resuscitation and allowed renal function to further improve. -Follow vital signs and adjust antihypertensive regimen as needed.  4-Hypothyroidism -TSH within normal limits -Continue Synthroid  5-Hyperlipidemia -Continue statins -Heart healthy diet encouraged.  6-mild acute kidney injury on CKD (chronic kidney disease), stage IIIB -Improved and essentially at baseline. -ARB will be discontinued at time of discharge. -Safe to resume diuretics. -Patient advised to maintain adequate hydration. -Repeat renal function panel follow-up visit to reassess electrolytes and renal function trend.  7-Paroxysmal atrial fibrillation (HCC) -Rate  controlled -Continue Eliquis for secondary prevention. -Continue outpatient follow-up with cardiology service.  8-Chronic diastolic HF (heart failure) (HCC) -Appears stable and compensated -Last ejection fraction 60 to 65% -Follow daily weights and low-sodium  diet. -Resume home diuretic regimen.  9-Anemia of chronic disease -Hemoglobin appears stable and at baseline for her -No signs of overt bleeding -Resume oral iron supplementation at discharge. Repeat CBC at follow-up visit to reassess hemoglobin stability.  10-Depression -No suicidal ideation or hallucinations -Stable mood. -Continue Lexapro.  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  12-CT scan findings suggesting hepatic cirrhosis -Normal LFTs -There is mild perihepatic and perisplenic ascites appreciated on abd images. -No prior history of cirrhosis or liver disease per patient reports. -Will need outpatient follow-up with gastroenterologist.   Procedures: See below for x-ray reports.  Consultations:  None  Discharge Exam: Vitals:   05/04/19 0508 05/04/19 0556  BP: (!) 186/71 (!) 158/64  Pulse: 79 72  Resp: 18   Temp: 98.4 F (36.9 C)   SpO2: 99%     General: Afebrile, no chest pain, no nausea, no vomiting.  Good oxygen saturation on room air.  Patient reports significant improvement/resolution of abdominal pain and has been able to tolerate diet and hydration without difficulties.  Feels ready to go home. Cardiovascular: Positive systolic ejection murmur, no rubs, no gallops, no JVD.  Rate controlled. Respiratory: Clear to auscultation bilaterally; normal respiratory effort. Abdomen: Soft, nontender, nondistended, positive bowel sounds Extremities: No cyanosis or clubbing.  Discharge Instructions   Discharge Instructions    (HEART FAILURE PATIENTS) Call MD:  Anytime you have any of the following symptoms: 1) 3 pound weight gain in 24  hours or 5 pounds in 1 week 2) shortness of breath, with or without a dry hacking cough 3) swelling in the hands, feet or stomach 4) if you have to sleep on extra pillows at night in order to breathe.   Complete by: As directed    Diet - low sodium heart healthy   Complete by: As directed    Discharge instructions   Complete by: As directed    Maintain adequate hydration Take medications as prescribed Follow heart healthy/low-sodium diet and watch amount of carbohydrates. Continue patient follow-up with nephrology service as previously scheduled.     Allergies as of 05/04/2019      Reactions   Ampicillin Rash      Medication List    STOP taking these medications   valsartan 80 MG tablet Commonly known as: DIOVAN     TAKE these medications   acetaminophen 650 MG CR tablet Commonly known as: TYLENOL Take 650 mg by mouth every 8 (eight) hours as needed for pain.   acetaZOLAMIDE 250 MG tablet Commonly known as: DIAMOX Take 1 tablet by mouth daily.   amLODipine 10 MG tablet Commonly known as: NORVASC Take 5 mg by mouth daily.   atorvastatin 80 MG tablet Commonly known as: LIPITOR Take 1 tablet (80 mg total) by mouth every evening.   calcium acetate 667 MG capsule Commonly known as: PHOSLO Take 1 capsule by mouth 2 (two) times a week. Mondays and tuursdays   Eliquis 2.5 MG Tabs tablet Generic drug: apixaban Take 2.5 mg by mouth 2 (two) times daily.   escitalopram 10 MG tablet Commonly known as: LEXAPRO Take 10 mg by mouth daily.   ferrous sulfate 325 (65 FE) MG tablet Take 1 tablet by mouth 2 (two) times daily.   furosemide 20 MG tablet Commonly known as: LASIX Take 1 tablet (20 mg total) by mouth daily.   gabapentin 300 MG capsule Commonly known as: NEURONTIN Take 1 capsule by mouth daily.   hydrALAZINE 100 MG  tablet Commonly known as: APRESOLINE Take 100 mg by mouth 3 (three) times daily.   isosorbide dinitrate 30 MG tablet Commonly known as:  ISORDIL Take 30 mg by mouth 3 (three) times daily.   levothyroxine 100 MCG tablet Commonly known as: SYNTHROID Take 100 mcg by mouth daily before breakfast.   omeprazole 20 MG capsule Commonly known as: PRILOSEC Take 20 mg by mouth daily.   sodium bicarbonate 650 MG tablet Take 1,300 mg by mouth 2 (two) times daily.   vitamin A & D ointment Apply 1 application topically as needed.      Allergies  Allergen Reactions  . Ampicillin Rash   Follow-up Information    Vasireddy, Lanetta Inch, MD. Schedule an appointment as soon as possible for a visit in 10 day(s).   Specialty: Internal Medicine Contact information: [place] [address] [phone]           The results of significant diagnostics from this hospitalization (including imaging, microbiology, ancillary and laboratory) are listed below for reference.    Significant Diagnostic Studies: CT ABDOMEN PELVIS WO CONTRAST  Result Date: 05/03/2019 CLINICAL DATA:  Abdominal pain and vomiting EXAM: CT ABDOMEN AND PELVIS WITHOUT CONTRAST TECHNIQUE: Multidetector CT imaging of the abdomen and pelvis was performed following the standard protocol without IV contrast. COMPARISON:  None. FINDINGS: LOWER CHEST: Bilateral lower lobe bronchiectasis. Multifocal bibasilar opacity. Mild cardiomegaly. HEPATOBILIARY: Diffusely nodular hepatic contours with relative hypertrophy of the caudate and left hepatic lobe, consistent with hepatic cirrhosis. No focal liver lesion. No biliary dilatation. Small volume perihepatic ascites. Normal gallbladder. PANCREAS: Normal pancreas. No ductal dilatation or peripancreatic fluid collection. SPLEEN: Normal.  Small amount of perisplenic ascites. ADRENALS/URINARY TRACT: The adrenal glands are normal. Lobulated contours of the kidneys. Intermediate attenuation exophytic structure of the right interpolar kidney is indeterminate and measures 1.6 cm. The urinary bladder is normal for degree of  distention STOMACH/BOWEL: There is no hiatal hernia. Normal duodenal course and caliber. No small bowel dilatation or inflammation. No focal colonic abnormality. The appendix is not visualized. No right lower quadrant inflammation or free fluid. VASCULAR/LYMPHATIC: There is calcific atherosclerosis of the abdominal aorta. No abdominal or pelvic lymphadenopathy. REPRODUCTIVE: Status post hysterectomy. No adnexal mass. MUSCULOSKELETAL. Multilevel degenerative disc disease and facet arthrosis. No bony spinal canal stenosis. OTHER: None. IMPRESSION: 1. No acute abnormality of the abdomen or pelvis. 2. Hepatic cirrhosis and small volume perihepatic and perisplenic ascites. 3. Bilateral lower lobe bronchiectasis and multifocal bibasilar pulmonary opacity. 4. Indeterminate intermediate density right renal lesion, possibly hemorrhagic/proteinaceous cyst. This could be further evaluated with renal ultrasound on a non-emergent basis. 5. Aortic Atherosclerosis (ICD10-I70.0). Electronically Signed   By: Ulyses Jarred M.D.   On: 05/03/2019 06:12   DG Chest Portable 1 View  Result Date: 05/03/2019 CLINICAL DATA:  79 year old female with a history of cough EXAM: PORTABLE CHEST 1 VIEW COMPARISON:  02/13/2018, CT 02/13/2018 FINDINGS: Cardiomediastinal silhouette unchanged in size and contour. Calcifications of the aortic arch. Interval removal of the loop recorder. Similar appearance of mixed interstitial and airspace opacity at the bilateral lung bases greater on the right. Thickening of the minor fissure is similar to the comparison. No pneumothorax or pleural effusion. No new confluent airspace disease. Aeration on the left is improved compared to the prior. No interlobular septal thickening. IMPRESSION: Slight improved aeration compared to the prior chest x-ray with ongoing mixed interstitial and airspace disease at the lung bases favored to be chronic scarring/volume loss. Early superimposed infection difficult to  exclude.  Interval removal of the loop recorder. Electronically Signed   By: Corrie Mckusick D.O.   On: 05/03/2019 07:46    Microbiology: Recent Results (from the past 240 hour(s))  SARS CORONAVIRUS 2 (TAT 6-24 HRS) Nasopharyngeal Nasopharyngeal Swab     Status: None   Collection Time: 05/03/19  6:24 AM   Specimen: Nasopharyngeal Swab  Result Value Ref Range Status   SARS Coronavirus 2 NEGATIVE NEGATIVE Final    Comment: (NOTE) SARS-CoV-2 target nucleic acids are NOT DETECTED. The SARS-CoV-2 RNA is generally detectable in upper and lower respiratory specimens during the acute phase of infection. Negative results do not preclude SARS-CoV-2 infection, do not rule out co-infections with other pathogens, and should not be used as the sole basis for treatment or other patient management decisions. Negative results must be combined with clinical observations, patient history, and epidemiological information. The expected result is Negative. Fact Sheet for Patients: SugarRoll.be Fact Sheet for Healthcare Providers: https://www.woods-mathews.com/ This test is not yet approved or cleared by the [place] FDA and  has been authorized for detection and/or diagnosis of SARS-CoV-2 by FDA under an Emergency Use Authorization (EUA). This EUA will remain  in effect (meaning this test can be used) for the duration of the COVID-19 declaration under Section 56 4(b)(1) of the Act, 21 U.S.C. section 360bbb-3(b)(1), unless the authorization is terminated or revoked sooner. Performed at [facility], [place] [address] [place], [place] [address]      Labs: Basic Metabolic Panel: Recent Labs  Lab 05/03/19 0355 05/03/19 0356 05/04/19 0701  NA 136  --  139  K 4.5  --  4.3  CL 113*  --  113*  CO2 17*  --  18*  GLUCOSE 223*  --  140*  BUN 30*  --  30*  CREATININE 1.87*  --  2.25*  CALCIUM 8.5*  --  8.1*  MG  --  2.5*  --   PHOS  --  4.1  --    Liver  Function Tests: Recent Labs  Lab 05/03/19 0356  AST 20  ALT 16  ALKPHOS 54  BILITOT 0.6  PROT 7.3  ALBUMIN 3.7   Recent Labs  Lab 05/03/19 0355  LIPASE 21   CBC: Recent Labs  Lab 05/03/19 0355  WBC 9.4  HGB 8.9*  HCT 29.2*  MCV 90.4  PLT 362    BNP (last 3 results) Recent Labs    05/03/19 0356  BNP 499.0*   CBG: Recent Labs  Lab 05/03/19 1141 05/03/19 1643 05/03/19 2101 05/04/19 0723 05/04/19 1130  GLUCAP 156* 147* 167* 127* 158*    Signed:  Barton Dubois MD.  [facility] 05/04/2019, 1:01 PM

## 2019-05-04 NOTE — Progress Notes (Signed)
Nsg Discharge Note  Admit Date:  05/03/2019 Discharge date: 05/04/2019   Beth Padilla to be D/C'd Home per MD order.  AVS completed.  Patient able to verbalize understanding.  Discharge Medication: Allergies as of 05/04/2019      Reactions   Ampicillin Rash      Medication List    STOP taking these medications   valsartan 80 MG tablet Commonly known as: DIOVAN     TAKE these medications   acetaminophen 650 MG CR tablet Commonly known as: TYLENOL Take 650 mg by mouth every 8 (eight) hours as needed for pain.   acetaZOLAMIDE 250 MG tablet Commonly known as: DIAMOX Take 1 tablet by mouth daily.   amLODipine 10 MG tablet Commonly known as: NORVASC Take 5 mg by mouth daily.   atorvastatin 80 MG tablet Commonly known as: LIPITOR Take 1 tablet (80 mg total) by mouth every evening.   calcium acetate 667 MG capsule Commonly known as: PHOSLO Take 1 capsule by mouth 2 (two) times a week. Mondays and tuursdays   Eliquis 2.5 MG Tabs tablet Generic drug: apixaban Take 2.5 mg by mouth 2 (two) times daily.   escitalopram 10 MG tablet Commonly known as: LEXAPRO Take 10 mg by mouth daily.   ferrous sulfate 325 (65 FE) MG tablet Take 1 tablet by mouth 2 (two) times daily.   furosemide 20 MG tablet Commonly known as: LASIX Take 1 tablet (20 mg total) by mouth daily.   gabapentin 300 MG capsule Commonly known as: NEURONTIN Take 1 capsule by mouth daily.   hydrALAZINE 100 MG tablet Commonly known as: APRESOLINE Take 100 mg by mouth 3 (three) times daily.   isosorbide dinitrate 30 MG tablet Commonly known as: ISORDIL Take 30 mg by mouth 3 (three) times daily.   levothyroxine 100 MCG tablet Commonly known as: SYNTHROID Take 100 mcg by mouth daily before breakfast.   omeprazole 20 MG capsule Commonly known as: PRILOSEC Take 20 mg by mouth daily.   sodium bicarbonate 650 MG tablet Take 1,300 mg by mouth 2 (two) times daily.   vitamin A & D ointment Apply 1  application topically as needed.       Discharge Assessment: Vitals:   05/04/19 0508 05/04/19 0556  BP: (!) 186/71 (!) 158/64  Pulse: 79 72  Resp: 18   Temp: 98.4 F (36.9 C)   SpO2: 99%    Skin clean, dry and intact without evidence of skin break down, no evidence of skin tears noted. IV catheter discontinued intact. Site without signs and symptoms of complications - no redness or edema noted at insertion site, patient denies c/o pain - only slight tenderness at site.  Dressing with slight pressure applied.  D/c Instructions-Education: Discharge instructions given to patient with verbalized understanding. D/c education completed with patient including follow up instructions, medication list, d/c activities limitations if indicated, with other d/c instructions as indicated by MD - patient able to verbalize understanding, all questions fully answered. Patient instructed to return to ED, call 911, or call MD for any changes in condition.  Patient escorted via [place], and D/C home via private auto.  Berton Bon, RN 05/04/2019 2:13 PM

## 2019-05-04 NOTE — Plan of Care (Signed)
  Problem: Nutrition: Goal: Adequate nutrition will be maintained Outcome: Progressing   Problem: Coping: Goal: Level of anxiety will decrease Outcome: Progressing   Problem: Pain Managment: Goal: General experience of comfort will improve Outcome: Progressing   Problem: Safety: Goal: Ability to remain free from injury will improve Outcome: Progressing   

## 2020-06-14 ENCOUNTER — Encounter (HOSPITAL_COMMUNITY): Payer: Self-pay

## 2020-06-14 ENCOUNTER — Other Ambulatory Visit: Payer: Self-pay

## 2020-06-14 ENCOUNTER — Emergency Department (HOSPITAL_COMMUNITY)
Admission: EM | Admit: 2020-06-14 | Discharge: 2020-06-14 | Disposition: A | Discharge status: Home / Self Care | Payer: Medicare Other | Attending: Emergency Medicine | Admitting: Emergency Medicine

## 2020-06-14 ENCOUNTER — Emergency Department (HOSPITAL_COMMUNITY): Payer: Medicare Other

## 2020-06-14 DIAGNOSIS — R059 Cough, unspecified: Secondary | ICD-10-CM | POA: Diagnosis present

## 2020-06-14 DIAGNOSIS — Z79899 Other long term (current) drug therapy: Secondary | ICD-10-CM | POA: Diagnosis not present

## 2020-06-14 DIAGNOSIS — U071 COVID-19: Secondary | ICD-10-CM

## 2020-06-14 DIAGNOSIS — I5032 Chronic diastolic (congestive) heart failure: Secondary | ICD-10-CM | POA: Diagnosis not present

## 2020-06-14 DIAGNOSIS — N183 Chronic kidney disease, stage 3 unspecified: Secondary | ICD-10-CM | POA: Insufficient documentation

## 2020-06-14 DIAGNOSIS — Z7901 Long term (current) use of anticoagulants: Secondary | ICD-10-CM | POA: Insufficient documentation

## 2020-06-14 DIAGNOSIS — I130 Hypertensive heart and chronic kidney disease with heart failure and stage 1 through stage 4 chronic kidney disease, or unspecified chronic kidney disease: Secondary | ICD-10-CM | POA: Diagnosis not present

## 2020-06-14 DIAGNOSIS — E039 Hypothyroidism, unspecified: Secondary | ICD-10-CM | POA: Insufficient documentation

## 2020-06-14 DIAGNOSIS — E1122 Type 2 diabetes mellitus with diabetic chronic kidney disease: Secondary | ICD-10-CM | POA: Diagnosis not present

## 2020-06-14 LAB — CBC WITH DIFFERENTIAL/PLATELET
Abs Immature Granulocytes: 0.04 10*3/uL (ref 0.00–0.07)
Basophils Absolute: 0 10*3/uL (ref 0.0–0.1)
Basophils Relative: 1 %
Eosinophils Absolute: 0 10*3/uL (ref 0.0–0.5)
Eosinophils Relative: 1 %
HCT: 30.4 % — ABNORMAL LOW (ref 36.0–46.0)
Hemoglobin: 8.9 g/dL — ABNORMAL LOW (ref 12.0–15.0)
Immature Granulocytes: 1 %
Lymphocytes Relative: 10 %
Lymphs Abs: 0.6 10*3/uL — ABNORMAL LOW (ref 0.7–4.0)
MCH: 25.5 pg — ABNORMAL LOW (ref 26.0–34.0)
MCHC: 29.3 g/dL — ABNORMAL LOW (ref 30.0–36.0)
MCV: 87.1 fL (ref 80.0–100.0)
Monocytes Absolute: 0.3 10*3/uL (ref 0.1–1.0)
Monocytes Relative: 6 %
Neutro Abs: 4.5 10*3/uL (ref 1.7–7.7)
Neutrophils Relative %: 81 %
Platelets: 320 10*3/uL (ref 150–400)
RBC: 3.49 MIL/uL — ABNORMAL LOW (ref 3.87–5.11)
RDW: 22.8 % — ABNORMAL HIGH (ref 11.5–15.5)
WBC: 5.5 10*3/uL (ref 4.0–10.5)
nRBC: 0.5 % — ABNORMAL HIGH (ref 0.0–0.2)

## 2020-06-14 LAB — COMPREHENSIVE METABOLIC PANEL
ALT: 25 U/L (ref 0–44)
AST: 28 U/L (ref 15–41)
Albumin: 3.2 g/dL — ABNORMAL LOW (ref 3.5–5.0)
Alkaline Phosphatase: 56 U/L (ref 38–126)
Anion gap: 8 (ref 5–15)
BUN: 85 mg/dL — ABNORMAL HIGH (ref 8–23)
CO2: 15 mmol/L — ABNORMAL LOW (ref 22–32)
Calcium: 7.6 mg/dL — ABNORMAL LOW (ref 8.9–10.3)
Chloride: 110 mmol/L (ref 98–111)
Creatinine, Ser: 2.28 mg/dL — ABNORMAL HIGH (ref 0.44–1.00)
GFR, Estimated: 21 mL/min — ABNORMAL LOW (ref >60)
Glucose, Bld: 198 mg/dL — ABNORMAL HIGH (ref 70–99)
Potassium: 4.7 mmol/L (ref 3.5–5.1)
Sodium: 133 mmol/L — ABNORMAL LOW (ref 135–145)
Total Bilirubin: 0.5 mg/dL (ref 0.3–1.2)
Total Protein: 7.2 g/dL (ref 6.5–8.1)

## 2020-06-14 LAB — RESP PANEL BY RT-PCR (FLU A&B, COVID) ARPGX2
Influenza A by PCR: NEGATIVE
Influenza B by PCR: NEGATIVE
SARS Coronavirus 2 by RT PCR: POSITIVE — AB

## 2020-06-14 MED ORDER — DOXYCYCLINE HYCLATE 100 MG PO TABS
100.0000 mg | ORAL_TABLET | Freq: Once | ORAL | Status: AC
  Administered 2020-06-14: 100 mg via ORAL
  Filled 2020-06-14: qty 1

## 2020-06-14 MED ORDER — BENZONATATE 100 MG PO CAPS
100.0000 mg | ORAL_CAPSULE | Freq: Once | ORAL | Status: AC
  Administered 2020-06-14: 100 mg via ORAL
  Filled 2020-06-14: qty 1

## 2020-06-14 MED ORDER — DOXYCYCLINE HYCLATE 100 MG PO CAPS: 100.0000 mg | ORAL_CAPSULE | Freq: Two times a day (BID) | ORAL | 0 refills | Status: AC

## 2020-06-14 MED ORDER — PROMETHAZINE-DM 6.25-15 MG/5ML PO SYRP: 1.2500 mL | ORAL_SOLUTION | Freq: Four times a day (QID) | ORAL | 0 refills | Status: DC | PRN

## 2020-06-14 MED ORDER — BENZONATATE 100 MG PO CAPS: 100.0000 mg | ORAL_CAPSULE | Freq: Three times a day (TID) | ORAL | 0 refills | Status: DC

## 2020-06-14 NOTE — ED Triage Notes (Signed)
Pt presents to ED with complaints of non productive cough x 2-3 weeks. Pt in NAD in triage. Denies fever. O2 sat 99% on RA

## 2020-06-14 NOTE — ED Provider Notes (Signed)
Emergency Medicine Provider Triage Evaluation Note  JOURY ALLCORN , a 80 y.o. female  was evaluated in triage.  Pt complains of cough  Review of Systems  Positive: Fever and shortness of breath Negative: Abdominal pain  Physical Exam  BP (!) 188/64 (BP Location: Right Arm)   Pulse 72   Temp 98.5 F (36.9 C) (Oral)   Resp 15   Ht 5\' 7"  (1.702 m)   Wt 70.8 kg   SpO2 100%   BMI 24.43 kg/m  Gen:   Awake, no distress   Resp:  Normal effort  MSK:   Moves extremities without difficulty  Other:    Medical Decision Making  Medically screening exam initiated at 11:57 AM.  Appropriate orders placed.  ANGELITA HARNACK was informed that the remainder of the evaluation will be completed by another provider, this initial triage assessment does not replace that evaluation, and the importance of remaining in the ED until their evaluation is complete.     Fransico Meadow, [place] 06/14/20 1158    Margette Fast, MD 06/17/20 0900

## 2020-06-14 NOTE — ED Notes (Signed)
Date and time results received: 06/14/20 1:59 PM   Test: covid Critical Value: positive  Name of Provider Notified: [facility]  Orders Received? Or Actions Taken? See orders

## 2020-06-14 NOTE — Discharge Instructions (Addendum)
You tested positive for COVID-19 today.  Given that you have been coughing for 3 weeks it is very possible that you initially had COVID and simply have continued irritation of the lungs as a result of this.  I recommend close follow-up with your primary care provider.  Drink plenty of water.  I have also prescribed you benzonatate which is a cough medicine as well as Promethazine DM which is a cough syrup that you may use at night.  You may have a post COVID cough for weeks or months.  We would like to help mitigate the symptoms.  I am going to go ahead and treat you with antibiotics.

## 2020-06-14 NOTE — ED Provider Notes (Signed)
[facility] Provider Note   CSN: [id number] Arrival date & time: 06/14/20  1024     History Chief Complaint  Patient presents with  . Cough    [facility] is a 80 y.o. female.  HPI  Patient is an 80 year old female with past medical history of anemia, A. fib, depression, DM 2, HLD, HTN, hypothyroidism, CKD 3, CHF, stroke, pneumonia  Patient is presented today with 3 weeks of relatively dry cough she states that occasionally she produces clear sputum or white sputum denies any fevers or chills.  She states that her symptoms have been relatively mild.  She is here because her symptoms are persistent.  She denies any chest pain shortness of breath lightheadedness dizziness.  No abdominal pain nausea vomiting or diarrhea.  She states she is vaccinated x2 but has not had a booster yet for COVID-19.  Denies any other associated symptoms.  No aggravating mitigating factors.  She states her symptoms are primarily frustrating for her.  She did states that she has some congestion when her cough are started which is improved.  Seems that she did also perhaps have some mild body aches which is also resolved at this time.     Past Medical History:  Diagnosis Date  . Anemia   . Atrial fibrillation ([place])   . Depression   . Diabetes mellitus   . Hyperlipidemia 12/15/2010  . Hypertension   . Hypothyroidism 12/15/2010  . Pneumonia   . Renal disorder     Patient Active Problem List   Diagnosis Date Noted  . Abdominal pain 05/03/2019  . Nausea & vomiting 05/03/2019  . Depression 05/03/2019  . Lobar pneumonia ([place]) 02/14/2018  . Chronic diastolic HF (heart failure) ([place]) 02/13/2018  . Anemia of chronic disease 02/13/2018  . Uncontrolled type 2 diabetes mellitus with hyperglycemia ([place]) 02/13/2018  . Acute respiratory failure with hypoxia ([place]) 02/13/2018  . Paroxysmal atrial fibrillation ([place]) 02/19/2015  . CVA (cerebral infarction)   . Elevated troponin    . CKD (chronic kidney disease), stage III ([place]) 07/19/2014  . Stroke ([place]) 07/18/2014  . Acute renal failure superimposed on stage 3 chronic kidney disease ([place]) 09/16/2013  . CHF (congestive heart failure) ([place]) 09/14/2013  . Bradycardia 09/14/2013  . UTI (urinary tract infection) 09/14/2013  . Type 2 diabetes with nephropathy ([place]) 12/15/2010  . HTN (hypertension) 12/15/2010  . Anemia 12/15/2010  . Hypothyroidism 12/15/2010  . Hyperlipidemia 12/15/2010    Past Surgical History:  Procedure Laterality Date  . ABDOMINAL HYSTERECTOMY    . EP IMPLANTABLE DEVICE N/A 07/21/2014   Procedure: Loop Recorder Insertion;  Surgeon: [name], MD;  Location: [facility];  Service: Cardiovascular;  Laterality: N/A;  . implantable loop recorder removal  10/14/2018   MDT Reveal LINQ removed in office by Dr [name]  . TEE WITHOUT CARDIOVERSION N/A 07/21/2014   Procedure: TRANSESOPHAGEAL ECHOCARDIOGRAM (TEE);  Surgeon: [name], MD;  Location: [facility] ENDOSCOPY;  Service: Cardiovascular;  Laterality: N/A;     OB History   No obstetric history on file.     Family History  Problem Relation Age of Onset  . Stroke Mother   . Cancer Sister     Social History   Tobacco Use  . Smoking status: Never Smoker  . Smokeless tobacco: Never Used  Substance Use Topics  . Alcohol use: No  . Drug use: No    Home Medications Prior to Admission medications   Medication Sig Start Date End  Date Taking? Authorizing Provider  benzonatate (TESSALON) 100 MG capsule Take 1 capsule (100 mg total) by mouth every 8 (eight) hours. 06/14/20  Yes Malisha Mabey, Ova Freshwater S, PA  doxycycline (VIBRAMYCIN) 100 MG capsule Take 1 capsule (100 mg total) by mouth 2 (two) times daily for 7 days. 06/14/20 06/21/20 Yes Nashae Maudlin S, PA  promethazine-dextromethorphan (PROMETHAZINE-DM) 6.25-15 MG/5ML syrup Take 1.3 mLs by mouth 4 (four) times daily as needed for cough. 06/14/20  Yes Pati Gallo S, PA  acetaminophen (TYLENOL) 650  MG CR tablet Take 650 mg by mouth every 8 (eight) hours as needed for pain.    [provider]  acetaZOLAMIDE (DIAMOX) 250 MG tablet Take 1 tablet by mouth daily. 11/30/17   [provider]  amLODipine (NORVASC) 10 MG tablet Take 5 mg by mouth daily. 06/18/18   [provider]  apixaban (ELIQUIS) 2.5 MG TABS tablet Take 2.5 mg by mouth 2 (two) times daily.    [provider]  atorvastatin (LIPITOR) 80 MG tablet Take 1 tablet (80 mg total) by mouth every evening. 07/22/14   [name], MD  calcium acetate (PHOSLO) 667 MG capsule Take 1 capsule by mouth 2 (two) times a week. Mondays and tuursdays 11/29/17   [provider]  escitalopram (LEXAPRO) 10 MG tablet Take 10 mg by mouth daily.    [provider]  ferrous sulfate 325 (65 FE) MG tablet Take 1 tablet by mouth 2 (two) times daily. 11/09/17   [provider]  furosemide (LASIX) 20 MG tablet Take 1 tablet (20 mg total) by mouth daily. 02/15/18   Orson Eva, MD  gabapentin (NEURONTIN) 300 MG capsule Take 1 capsule by mouth daily. 01/03/18   [provider]  hydrALAZINE (APRESOLINE) 100 MG tablet Take 100 mg by mouth 3 (three) times daily.    [provider]  isosorbide dinitrate (ISORDIL) 30 MG tablet Take 30 mg by mouth 3 (three) times daily.  09/18/15   [provider]  levothyroxine (SYNTHROID, LEVOTHROID) 100 MCG tablet Take 100 mcg by mouth daily before breakfast.    [provider]  omeprazole (PRILOSEC) 20 MG capsule Take 20 mg by mouth daily.    [provider]  sodium bicarbonate 650 MG tablet Take 1,300 mg by mouth 2 (two) times daily.    [provider]  Vitamins A & D (VITAMIN A & D) ointment Apply 1 application topically as needed. 11/09/17   [provider]    Allergies    Ampicillin  Review of Systems   Review of Systems  Constitutional: Negative for chills and fever.  HENT: Negative for congestion.   Eyes:  Negative for pain.  Respiratory: Positive for cough. Negative for shortness of breath.   Cardiovascular: Negative for chest pain and leg swelling.  Gastrointestinal: Negative for abdominal pain and vomiting.  Genitourinary: Negative for dysuria.  Musculoskeletal: Negative for myalgias.  Skin: Negative for rash.  Neurological: Negative for dizziness and headaches.    Physical Exam Updated Vital Signs BP (!) 156/82 (BP Location: Right Arm)   Pulse 77   Temp 98 F (36.7 C) (Oral)   Resp 16   Ht 5\' 7"  (1.702 m)   Wt 70.8 kg   SpO2 99%   BMI 24.43 kg/m   Physical Exam Vitals and nursing note reviewed.  Constitutional:      General: She is not in acute distress.    Comments: Pleasant well-appearing 80 year old.  In no acute distress.  Sitting comfortably in bed.  Able answer questions appropriately follow commands. No increased work of breathing. Speaking in full sentences.  HENT:     Head: Normocephalic and atraumatic.     Nose: Nose normal.     Mouth/Throat:     Mouth: Mucous membranes are moist.  Eyes:     General: No scleral icterus. Cardiovascular:     Rate and Rhythm: Normal rate and regular rhythm.     Pulses: Normal pulses.     Heart sounds: Normal heart sounds.  Pulmonary:     Effort: Pulmonary effort is normal. No respiratory distress.     Breath sounds: No wheezing.     Comments: Bronchovesicular breath sounds with faint crackles auscultated Abdominal:     Palpations: Abdomen is soft.     Tenderness: There is no abdominal tenderness. There is no guarding or rebound.  Musculoskeletal:     Cervical back: Normal range of motion.     Right lower leg: No edema.     Left lower leg: No edema.     Comments: No significant lower extremity edema  Skin:    General: Skin is warm and dry.     Capillary Refill: Capillary refill takes less than 2 seconds.  Neurological:     Mental Status: She is alert. Mental status is at baseline.  Psychiatric:        Mood and Affect:  Mood normal.        Behavior: Behavior normal.     ED Results / Procedures / Treatments   Labs (all labs ordered are listed, but only abnormal results are displayed) Labs Reviewed  RESP PANEL BY RT-PCR (FLU A&B, COVID) ARPGX2 - Abnormal; Notable for the following components:      Result Value   SARS Coronavirus 2 by RT PCR POSITIVE (*)    All other components within normal limits  CBC WITH DIFFERENTIAL/PLATELET - Abnormal; Notable for the following components:   RBC 3.49 (*)    Hemoglobin 8.9 (*)    HCT 30.4 (*)    MCH 25.5 (*)    MCHC 29.3 (*)    RDW 22.8 (*)    nRBC 0.5 (*)    Lymphs Abs 0.6 (*)    All other components within normal limits  COMPREHENSIVE METABOLIC PANEL - Abnormal; Notable for the following components:   Sodium 133 (*)    CO2 15 (*)    Glucose, Bld 198 (*)    BUN 85 (*)    Creatinine, Ser 2.28 (*)    Calcium 7.6 (*)    Albumin 3.2 (*)    GFR, Estimated 21 (*)    All other components within normal limits    EKG None  Radiology DG Chest 2 View  Result Date: 06/14/2020 CLINICAL DATA:  Cough EXAM: CHEST - 2 VIEW COMPARISON:  05/03/2019 FINDINGS: Patchy opacities in the left upper lobe and bilateral lower lobes, suspicious for pneumonia. Possible trace right pleural effusion. No pneumothorax. Cardiomegaly.  Thoracic aortic atherosclerosis. Mild degenerative changes of the visualized thoracolumbar spine. IMPRESSION: Suspected multifocal pneumonia, lower lobe predominant. Electronically Signed   By: Julian Hy M.D.   On: 06/14/2020 10:57    Procedures Procedures   Medications Ordered in ED Medications  doxycycline (VIBRA-TABS) tablet 100 mg (has no administration in time range)  benzonatate (TESSALON) capsule 100 mg (has no administration in time range)    ED Course  I have reviewed the triage vital signs and the nursing notes.  Pertinent labs & imaging results that were available  during my care of the patient were reviewed by me and  considered in my medical decision making (see chart for details).    MDM Rules/Calculators/A&P                          Patient is a well-appearing 79 year old female she has some pertinent past medical history with chronic issues including A. fib CHF, CKD, anemia however she is presented today for cough and has no other significant symptoms associated with this.  No hemoptysis.  She is anticoagulated for her A. fib.  Physical exam is unremarkable apart from some faint crackles with normal SPO2 and no shortness of breath from patient she is very well-appearing.  Does not appear to be fluid overloaded with no JVP and no significant lower extremity edema.  Suspect that her crackles are somewhat due to either atelectasis or her COVID.  Given 3-weeks of symptoms she is well outside of any treatment.  For Paxlovid and with her renal disease this would not be an option for her and less renally dose.  CBC with stable anemia no leukocytosis, CMP with stable CKD.  No significant changes.  Electrolytes within normal limits.  COVID is positive influenza is negative chest x-ray shows multifocal pneumonia do not see any distinct infiltrate on chest x-ray.  Given her age and comorbidities will treat with doxycycline.  Briefly discussed with my attending physician given her age and the period of time she is been having symptoms for doubt that she would benefit from any antivirals.  We will instead treat for secondary pneumonia with doxycycline and provided 2 antitussives.  Suspect that this is likely post-COVID cough and may be ongoing for weeks or months.  She will follow-up with PCP.   RENE GONSOULIN was evaluated in Emergency Department on 06/14/2020 for the symptoms described in the history of present illness. She was evaluated in the context of the global COVID-19 pandemic, which necessitated consideration that the patient might be at risk for infection with the SARS-CoV-2 virus that causes COVID-19.  Institutional protocols and algorithms that pertain to the evaluation of patients at risk for COVID-19 are in a state of rapid change based on information released by regulatory bodies including the CDC and federal and state organizations. These policies and algorithms were followed during the patient's care in the ED.   Final Clinical Impression(s) / ED Diagnoses Final diagnoses:  Cough  COVID-19    Rx / DC Orders ED Discharge Orders         Ordered    benzonatate (TESSALON) 100 MG capsule  Every 8 hours        06/14/20 1406    promethazine-dextromethorphan (PROMETHAZINE-DM) 6.25-15 MG/5ML syrup  4 times daily PRN        06/14/20 1406    doxycycline (VIBRAMYCIN) 100 MG capsule  2 times daily        06/14/20 1406           Pati Gallo [place], [place] 06/14/20 1454    Margette Fast, MD 06/17/20 0900

## 2020-06-14 NOTE — ED Notes (Signed)
Pt walked in room, O2 stayed 97-99. Pt stated she did not feel dizzy and felt stable.

## 2020-06-18 ENCOUNTER — Emergency Department (HOSPITAL_COMMUNITY)
Admission: EM | Admit: 2020-06-18 | Discharge: 2020-06-18 | Disposition: A | Discharge status: Home / Self Care | Payer: Medicare Other | Attending: Emergency Medicine | Admitting: Emergency Medicine

## 2020-06-18 ENCOUNTER — Emergency Department (HOSPITAL_COMMUNITY): Payer: Medicare Other

## 2020-06-18 ENCOUNTER — Other Ambulatory Visit: Payer: Self-pay

## 2020-06-18 ENCOUNTER — Encounter (HOSPITAL_COMMUNITY): Payer: Self-pay

## 2020-06-18 DIAGNOSIS — Z7901 Long term (current) use of anticoagulants: Secondary | ICD-10-CM | POA: Insufficient documentation

## 2020-06-18 DIAGNOSIS — Z79899 Other long term (current) drug therapy: Secondary | ICD-10-CM | POA: Insufficient documentation

## 2020-06-18 DIAGNOSIS — R600 Localized edema: Secondary | ICD-10-CM | POA: Diagnosis not present

## 2020-06-18 DIAGNOSIS — J1282 Pneumonia due to coronavirus disease 2019: Secondary | ICD-10-CM | POA: Diagnosis not present

## 2020-06-18 DIAGNOSIS — N183 Chronic kidney disease, stage 3 unspecified: Secondary | ICD-10-CM | POA: Diagnosis not present

## 2020-06-18 DIAGNOSIS — I5032 Chronic diastolic (congestive) heart failure: Secondary | ICD-10-CM | POA: Diagnosis not present

## 2020-06-18 DIAGNOSIS — I130 Hypertensive heart and chronic kidney disease with heart failure and stage 1 through stage 4 chronic kidney disease, or unspecified chronic kidney disease: Secondary | ICD-10-CM | POA: Diagnosis not present

## 2020-06-18 DIAGNOSIS — R059 Cough, unspecified: Secondary | ICD-10-CM | POA: Diagnosis present

## 2020-06-18 DIAGNOSIS — E1122 Type 2 diabetes mellitus with diabetic chronic kidney disease: Secondary | ICD-10-CM | POA: Diagnosis not present

## 2020-06-18 DIAGNOSIS — U071 COVID-19: Secondary | ICD-10-CM | POA: Diagnosis not present

## 2020-06-18 DIAGNOSIS — E039 Hypothyroidism, unspecified: Secondary | ICD-10-CM | POA: Diagnosis not present

## 2020-06-18 MED ORDER — ALBUTEROL SULFATE HFA 108 (90 BASE) MCG/ACT IN AERS
4.0000 | INHALATION_SPRAY | Freq: Once | RESPIRATORY_TRACT | Status: AC
  Administered 2020-06-18: 4 via RESPIRATORY_TRACT
  Filled 2020-06-18: qty 6.7

## 2020-06-18 NOTE — ED Triage Notes (Signed)
Patient reports diagnosis of Covid this past Monday. States that she feels more short of breath and is wheezing. Continues to complain of cough.

## 2020-06-18 NOTE — ED Provider Notes (Signed)
[facility] Provider Note   CSN: [id number] Arrival date & time: 06/18/20  9735     History Chief Complaint  Patient presents with  . Shortness of Breath    Beth Padilla is a 80 y.o. female.  HPI     Beth Padilla is a 80 y.o. female with past medical history of atrial fibrillation, hypertension, chronic kidney disease, and hyperlipidemia.  She was seen in the emergency department on 06/14/2020 for cough.  Initially, she states her symptoms were very mild and her cough was mostly nonproductive.  No fever or chills.  She was treated with doxycycline, Tessalon Perles and cough syrup with Phenergan.  Covid test was positive.  Patient felt to be stable in good condition and discharged home.  On Wednesday, she states that her cough became more persistent and associated with shortness of breath and wheezing.  She also complains of generalized malaise and weakness.  She denies any chest pain, abdominal pain, vomiting or diarrhea.  She does have bouts of nausea.  She has been vaccinated for COVID x2, but no booster.   Past Medical History:  Diagnosis Date  . Anemia   . Atrial fibrillation ([place])   . Depression   . Diabetes mellitus   . Hyperlipidemia 12/15/2010  . Hypertension   . Hypothyroidism 12/15/2010  . Pneumonia   . Renal disorder     Patient Active Problem List   Diagnosis Date Noted  . Abdominal pain 05/03/2019  . Nausea & vomiting 05/03/2019  . Depression 05/03/2019  . Lobar pneumonia ([place]) 02/14/2018  . Chronic diastolic HF (heart failure) ([place]) 02/13/2018  . Anemia of chronic disease 02/13/2018  . Uncontrolled type 2 diabetes mellitus with hyperglycemia ([place]) 02/13/2018  . Acute respiratory failure with hypoxia ([place]) 02/13/2018  . Paroxysmal atrial fibrillation ([place]) 02/19/2015  . CVA (cerebral infarction)   . Elevated troponin   . CKD (chronic kidney disease), stage III ([place]) 07/19/2014  . Stroke ([place]) 07/18/2014  . Acute renal  failure superimposed on stage 3 chronic kidney disease ([place]) 09/16/2013  . CHF (congestive heart failure) ([place]) 09/14/2013  . Bradycardia 09/14/2013  . UTI (urinary tract infection) 09/14/2013  . Type 2 diabetes with nephropathy ([place]) 12/15/2010  . HTN (hypertension) 12/15/2010  . Anemia 12/15/2010  . Hypothyroidism 12/15/2010  . Hyperlipidemia 12/15/2010    Past Surgical History:  Procedure Laterality Date  . ABDOMINAL HYSTERECTOMY    . EP IMPLANTABLE DEVICE N/A 07/21/2014   Procedure: Loop Recorder Insertion;  Surgeon: [name], MD;  Location: [facility];  Service: Cardiovascular;  Laterality: N/A;  . implantable loop recorder removal  10/14/2018   MDT Reveal LINQ removed in office by Dr [name]  . TEE WITHOUT CARDIOVERSION N/A 07/21/2014   Procedure: TRANSESOPHAGEAL ECHOCARDIOGRAM (TEE);  Surgeon: [name], MD;  Location: [facility] ENDOSCOPY;  Service: Cardiovascular;  Laterality: N/A;     OB History   No obstetric history on file.     Family History  Problem Relation Age of Onset  . Stroke Mother   . Cancer Sister     Social History   Tobacco Use  . Smoking status: Never Smoker  . Smokeless tobacco: Never Used  Substance Use Topics  . Alcohol use: No  . Drug use: No    Home Medications Prior to Admission medications   Medication Sig Start Date End Date Taking? Authorizing Provider  acetaminophen (TYLENOL) 650 MG CR tablet Take 650 mg by mouth every 8 (eight) hours  as needed for pain.    [provider]  acetaZOLAMIDE (DIAMOX) 250 MG tablet Take 1 tablet by mouth daily. 11/30/17   [provider]  amLODipine (NORVASC) 10 MG tablet Take 5 mg by mouth daily. 06/18/18   [provider]  apixaban (ELIQUIS) 2.5 MG TABS tablet Take 2.5 mg by mouth 2 (two) times daily.    [provider]  atorvastatin (LIPITOR) 80 MG tablet Take 1 tablet (80 mg total) by mouth every evening. 07/22/14   [name], MD  benzonatate (TESSALON) 100 MG  capsule Take 1 capsule (100 mg total) by mouth every 8 (eight) hours. 06/14/20   Tedd Sias, PA  calcium acetate (PHOSLO) 667 MG capsule Take 1 capsule by mouth 2 (two) times a week. Mondays and tuursdays 11/29/17   [provider]  doxycycline (VIBRAMYCIN) 100 MG capsule Take 1 capsule (100 mg total) by mouth 2 (two) times daily for 7 days. 06/14/20 06/21/20  Tedd Sias, PA  escitalopram (LEXAPRO) 10 MG tablet Take 10 mg by mouth daily.    [provider]  ferrous sulfate 325 (65 FE) MG tablet Take 1 tablet by mouth 2 (two) times daily. 11/09/17   [provider]  furosemide (LASIX) 20 MG tablet Take 1 tablet (20 mg total) by mouth daily. 02/15/18   Orson Eva, MD  gabapentin (NEURONTIN) 300 MG capsule Take 1 capsule by mouth daily. 01/03/18   [provider]  hydrALAZINE (APRESOLINE) 100 MG tablet Take 100 mg by mouth 3 (three) times daily.    [provider]  isosorbide dinitrate (ISORDIL) 30 MG tablet Take 30 mg by mouth 3 (three) times daily.  09/18/15   [provider]  levothyroxine (SYNTHROID, LEVOTHROID) 100 MCG tablet Take 100 mcg by mouth daily before breakfast.    [provider]  omeprazole (PRILOSEC) 20 MG capsule Take 20 mg by mouth daily.    [provider]  promethazine-dextromethorphan (PROMETHAZINE-DM) 6.25-15 MG/5ML syrup Take 1.3 mLs by mouth 4 (four) times daily as needed for cough. 06/14/20   Tedd Sias, PA  sodium bicarbonate 650 MG tablet Take 1,300 mg by mouth 2 (two) times daily.    [provider]  Vitamins A & D (VITAMIN A & D) ointment Apply 1 application topically as needed. 11/09/17   [provider]    Allergies    Ampicillin  Review of Systems   Review of Systems  Constitutional: Positive for fatigue. Negative for chills and fever.  HENT: Negative for trouble swallowing.   Respiratory: Positive for cough, shortness of breath and wheezing.   Cardiovascular:  Negative for chest pain and palpitations.  Gastrointestinal: Positive for nausea. Negative for abdominal pain, diarrhea and vomiting.  Genitourinary: Negative for dysuria, flank pain and hematuria.  Musculoskeletal: Negative for arthralgias, back pain, myalgias, neck pain and neck stiffness.  Skin: Negative for rash.  Neurological: Positive for weakness (genralized weakness). Negative for dizziness, seizures, numbness and headaches.  Hematological: Does not bruise/bleed easily.  Psychiatric/Behavioral: Negative for confusion.    Physical Exam Updated Vital Signs BP (!) 200/67   Pulse 74   Temp 98.5 F (36.9 C)   Resp (!) 26   Ht 5\' 7"  (1.702 m)   Wt 74.4 kg   SpO2 98%   BMI 25.69 kg/m   Physical Exam Vitals and nursing note reviewed.  Constitutional:      Appearance: Normal appearance. She is not toxic-appearing.  HENT:     Mouth/Throat:  Mouth: Mucous membranes are moist.  Eyes:     Pupils: Pupils are equal, round, and reactive to light.  Neck:     Thyroid: No thyromegaly.     Vascular: No JVD.     Meningeal: Kernig's sign absent.  Cardiovascular:     Rate and Rhythm: Normal rate and regular rhythm.     Pulses: Normal pulses.  Pulmonary:     Effort: Pulmonary effort is normal.     Breath sounds: Wheezing present. No rales.     Comments: Scattered expiratory wheezes present throughout.  Slight increased work of breathing with speech.  No rales present Abdominal:     Palpations: Abdomen is soft.     Tenderness: There is no abdominal tenderness. There is no guarding or rebound.  Musculoskeletal:        General: Normal range of motion.     Cervical back: Normal range of motion.     Right lower leg: Edema present.     Left lower leg: Edema present.  Skin:    General: Skin is warm.     Capillary Refill: Capillary refill takes less than 2 seconds.     Findings: No rash.  Neurological:     General: No focal deficit present.     Mental Status: She is alert and  oriented to person, place, and time.     Sensory: No sensory deficit.     Motor: No weakness.     ED Results / Procedures / Treatments   Labs (all labs ordered are listed, but only abnormal results are displayed) Labs Reviewed - No data to display  EKG None  Radiology  DG Chest Portable 1 View  Result Date: 06/18/2020 CLINICAL DATA:  Cough and shortness of breath.  COVID-19 positive. EXAM: PORTABLE CHEST 1 VIEW COMPARISON:  Chest x-ray dated Jun 14, 2020. FINDINGS: Unchanged mild cardiomegaly. Patchy bibasilar interstitial and airspace opacities are not significantly changed. No pleural effusion or pneumothorax. No acute osseous abnormality. IMPRESSION: 1. Unchanged multifocal pneumonia. Electronically Signed   By: Titus Dubin M.D.   On: 06/18/2020 09:58     Procedures Procedures   Medications Ordered in ED Medications  albuterol (VENTOLIN HFA) 108 (90 Base) MCG/ACT inhaler 4 puff (has no administration in time range)    ED Course  I have reviewed the triage vital signs and the nursing notes.  Pertinent labs & imaging results that were available during my care of the patient were reviewed by me and considered in my medical decision making (see chart for details).    MDM Rules/Calculators/A&P                          Patient 80 year old female with history of diabetes and chronic kidney disease.  She was seen here 4 days ago and tested positive for COVID-19.  She returns today due to increasing cough and now having shortness of breath.  No chest pain.  On exam, patient is hypertensive and somewhat tachypneic, but afebrile and no tachycardia.  Auscultation of her lungs reveals some expiratory wheezes throughout.  She does have some increased work of breathing.  Will repeat her chest x-ray today and administer albuterol MDI.  Her symptoms have been present for greater than 2 weeks and it was initially felt that she was not a Paxlovid candidate given duration of symptoms and  chronic kidney disease.  She was started on doxycycline given her age and comorbidities.  On her previous visit,  labs were obtained, there was no leukocytosis, kidney function and hemoglobin appear baseline.  She is currently anticoagulated with Eliquis for atrial fibrillation.   On recheck, patient is feeling better.  She has been given albuterol and dispensed MDI for home use  She is ambulated in the department, has limited mobility given age at baseline  Chest x-ray shows unchanged multifocal pneumonia, likely secondary to her COVID infection.  Tachypnea and blood pressure also improved. Patient ambulated in the department maintained O2 saturation of 94% on room air. Discussed care plan with Dr. Laverta Baltimore and pt preferring d/c home and agrees to close out pt f/u with PCP.  She was given strict return precautions and she verbalized understanding.   DEETTE REVAK was evaluated in Emergency Department on 06/18/2020 for the symptoms described in the history of present illness. She was evaluated in the context of the global COVID-19 pandemic, which necessitated consideration that the patient might be at risk for infection with the SARS-CoV-2 virus that causes COVID-19. Institutional protocols and algorithms that pertain to the evaluation of patients at risk for COVID-19 are in a state of rapid change based on information released by regulatory bodies including the CDC and federal and state organizations. These policies and algorithms were followed during the patient's care in the ED.   Final Clinical Impression(s) / ED Diagnoses Final diagnoses:  Pneumonia due to COVID-19 virus    Rx / DC Orders ED Discharge Orders    None       Kem Parkinson, PA-C 06/21/20 1454    Long, Wonda Old[facility], MD 06/21/20 [phone]

## 2020-06-18 NOTE — ED Notes (Signed)
Tried walking pt with oximetry tried one circle states she cant do anymore o2 94 pulse 79

## 2020-06-18 NOTE — Discharge Instructions (Addendum)
Continue to take your previously prescribed medication as directed.  Use the albuterol inhaler, 1 to 2 puffs every 4-6 hours for wheezing and/or shortness of breath.  Please follow-up with your primary care provider for recheck.  Your blood pressure today is elevated.  Make sure that you take your blood pressure medications daily as directed.

## 2020-06-23 ENCOUNTER — Emergency Department (HOSPITAL_COMMUNITY)
Admission: EM | Admit: 2020-06-23 | Discharge: 2020-06-23 | Disposition: A | Discharge status: Home / Self Care | Payer: Medicare Other | Attending: Emergency Medicine | Admitting: Emergency Medicine

## 2020-06-23 ENCOUNTER — Other Ambulatory Visit: Payer: Self-pay

## 2020-06-23 ENCOUNTER — Encounter (HOSPITAL_COMMUNITY): Payer: Self-pay

## 2020-06-23 DIAGNOSIS — E1122 Type 2 diabetes mellitus with diabetic chronic kidney disease: Secondary | ICD-10-CM | POA: Insufficient documentation

## 2020-06-23 DIAGNOSIS — I130 Hypertensive heart and chronic kidney disease with heart failure and stage 1 through stage 4 chronic kidney disease, or unspecified chronic kidney disease: Secondary | ICD-10-CM | POA: Insufficient documentation

## 2020-06-23 DIAGNOSIS — Z79899 Other long term (current) drug therapy: Secondary | ICD-10-CM | POA: Diagnosis not present

## 2020-06-23 DIAGNOSIS — E039 Hypothyroidism, unspecified: Secondary | ICD-10-CM | POA: Diagnosis not present

## 2020-06-23 DIAGNOSIS — I5032 Chronic diastolic (congestive) heart failure: Secondary | ICD-10-CM | POA: Insufficient documentation

## 2020-06-23 DIAGNOSIS — I480 Paroxysmal atrial fibrillation: Secondary | ICD-10-CM | POA: Insufficient documentation

## 2020-06-23 DIAGNOSIS — N183 Chronic kidney disease, stage 3 unspecified: Secondary | ICD-10-CM | POA: Insufficient documentation

## 2020-06-23 DIAGNOSIS — Z8616 Personal history of COVID-19: Secondary | ICD-10-CM | POA: Diagnosis not present

## 2020-06-23 DIAGNOSIS — R600 Localized edema: Secondary | ICD-10-CM | POA: Diagnosis not present

## 2020-06-23 DIAGNOSIS — M7989 Other specified soft tissue disorders: Secondary | ICD-10-CM | POA: Diagnosis present

## 2020-06-23 DIAGNOSIS — E1140 Type 2 diabetes mellitus with diabetic neuropathy, unspecified: Secondary | ICD-10-CM | POA: Diagnosis not present

## 2020-06-23 DIAGNOSIS — Z7901 Long term (current) use of anticoagulants: Secondary | ICD-10-CM | POA: Insufficient documentation

## 2020-06-23 DIAGNOSIS — D631 Anemia in chronic kidney disease: Secondary | ICD-10-CM | POA: Insufficient documentation

## 2020-06-23 DIAGNOSIS — E875 Hyperkalemia: Secondary | ICD-10-CM | POA: Insufficient documentation

## 2020-06-23 LAB — BASIC METABOLIC PANEL
Anion gap: 8 (ref 5–15)
BUN: 69 mg/dL — ABNORMAL HIGH (ref 8–23)
CO2: 16 mmol/L — ABNORMAL LOW (ref 22–32)
Calcium: 8.6 mg/dL — ABNORMAL LOW (ref 8.9–10.3)
Chloride: 112 mmol/L — ABNORMAL HIGH (ref 98–111)
Creatinine, Ser: 2.31 mg/dL — ABNORMAL HIGH (ref 0.44–1.00)
GFR, Estimated: 21 mL/min — ABNORMAL LOW (ref >60)
Glucose, Bld: 196 mg/dL — ABNORMAL HIGH (ref 70–99)
Potassium: 5.3 mmol/L — ABNORMAL HIGH (ref 3.5–5.1)
Sodium: 136 mmol/L (ref 135–145)

## 2020-06-23 LAB — CBC
HCT: 34.1 % — ABNORMAL LOW (ref 36.0–46.0)
Hemoglobin: 10 g/dL — ABNORMAL LOW (ref 12.0–15.0)
MCH: 25.1 pg — ABNORMAL LOW (ref 26.0–34.0)
MCHC: 29.3 g/dL — ABNORMAL LOW (ref 30.0–36.0)
MCV: 85.7 fL (ref 80.0–100.0)
Platelets: 367 10*3/uL (ref 150–400)
RBC: 3.98 MIL/uL (ref 3.87–5.11)
RDW: 23.8 % — ABNORMAL HIGH (ref 11.5–15.5)
WBC: 5 10*3/uL (ref 4.0–10.5)
nRBC: 0 % (ref 0.0–0.2)

## 2020-06-23 MED ORDER — SODIUM ZIRCONIUM CYCLOSILICATE 5 G PO PACK
5.0000 g | PACK | Freq: Once | ORAL | Status: AC
  Administered 2020-06-23: 5 g via ORAL
  Filled 2020-06-23: qty 1

## 2020-06-23 NOTE — ED Notes (Signed)
Pt voiced she did not take her bp meds this morning.

## 2020-06-23 NOTE — Discharge Instructions (Signed)
Please continue to take the fluid pills (furosemide) daily -  Please take your blood pressure medicines and talk to your doctor about considering that amlodipine which you do take for blood pressure can cause leg swelling  We have given you a dose of medication to help with a high potassium, it is minimally elevated which is good.  You will need to have your blood checked within 1 week at your doctor's office however you should return to the emergency department for severe or worsening symptoms including weakness or vomiting or any other worsening or concerning symptoms.

## 2020-06-23 NOTE — ED Triage Notes (Signed)
Pt to er, pt states that she is here because she is having swelling to her bilateral ankles.  States that she was here last week for covid, states that they gave her some medicine and she felt better, states that she followed up with her md and told him her leg was swollen and he gave her some fluid pills, states that he did some blood work and called to tell her that her potassium was high and to come back, but she decided to come to the er instead.

## 2020-06-23 NOTE — ED Provider Notes (Signed)
[facility] EMERGENCY DEPARTMENT Provider Note   CSN: [id number] Arrival date & time: 06/23/20  1049     History Chief Complaint  Patient presents with  . Leg Swelling         Beth Padilla is a 80 y.o. female.  HPI   This patient is an 80 year old female, history of atrial fibrillation, diabetes hypertension, chronic anemia and hypothyroidism.  She has had a prior stroke, she takes medications including levothyroxine, hydralazine, Lasix most recently prescribed in January as well as Eliquis.  She presents with bilateral lower extremity swelling, states that after developing COVID about 1-1/2 weeks ago she was referred to her primary doctor who did blood work and called her to tell her that she needed to go back to the office for a repeat blood draw because of hyperkalemia, instead she comes to the emergency department.  She is overall feeling better but is frustrated by the swelling in her ankles which is right greater than left.  No history of DVT and again the Patient does take Eliquis.  No weakness, no vomiting, diarrhea has resolved - mild cough which is improving.  Past Medical History:  Diagnosis Date  . Anemia   . Atrial fibrillation ([place])   . Depression   . Diabetes mellitus   . Hyperlipidemia 12/15/2010  . Hypertension   . Hypothyroidism 12/15/2010  . Pneumonia   . Renal disorder     Patient Active Problem List   Diagnosis Date Noted  . Abdominal pain 05/03/2019  . Nausea & vomiting 05/03/2019  . Depression 05/03/2019  . Lobar pneumonia ([place]) 02/14/2018  . Chronic diastolic HF (heart failure) ([place]) 02/13/2018  . Anemia of chronic disease 02/13/2018  . Uncontrolled type 2 diabetes mellitus with hyperglycemia ([place]) 02/13/2018  . Acute respiratory failure with hypoxia ([place]) 02/13/2018  . Paroxysmal atrial fibrillation ([place]) 02/19/2015  . CVA (cerebral infarction)   . Elevated troponin   . CKD (chronic kidney disease), stage III ([place]) 07/19/2014  . Stroke  ([place]) 07/18/2014  . Acute renal failure superimposed on stage 3 chronic kidney disease ([place]) 09/16/2013  . CHF (congestive heart failure) ([place]) 09/14/2013  . Bradycardia 09/14/2013  . UTI (urinary tract infection) 09/14/2013  . Type 2 diabetes with nephropathy ([place]) 12/15/2010  . HTN (hypertension) 12/15/2010  . Anemia 12/15/2010  . Hypothyroidism 12/15/2010  . Hyperlipidemia 12/15/2010    Past Surgical History:  Procedure Laterality Date  . ABDOMINAL HYSTERECTOMY    . EP IMPLANTABLE DEVICE N/A 07/21/2014   Procedure: Loop Recorder Insertion;  Surgeon: [name], MD;  Location: [facility] [place] [facility];  Service: Cardiovascular;  Laterality: N/A;  . implantable loop recorder removal  10/14/2018   MDT Reveal LINQ removed in office by Dr [name]  . TEE WITHOUT CARDIOVERSION N/A 07/21/2014   Procedure: TRANSESOPHAGEAL ECHOCARDIOGRAM (TEE);  Surgeon: [name], MD;  Location: [facility] ENDOSCOPY;  Service: Cardiovascular;  Laterality: N/A;     OB History   No obstetric history on file.     Family History  Problem Relation Age of Onset  . Stroke Mother   . Cancer Sister     Social History   Tobacco Use  . Smoking status: Never Smoker  . Smokeless tobacco: Never Used  Substance Use Topics  . Alcohol use: No  . Drug use: No    Home Medications Prior to Admission medications   Medication Sig Start Date End Date Taking? Authorizing Provider  acetaminophen (TYLENOL) 650 MG CR tablet Take 650 mg by  mouth every 8 (eight) hours as needed for pain.    [provider]  acetaZOLAMIDE (DIAMOX) 250 MG tablet Take 1 tablet by mouth daily. 11/30/17   [provider]  amLODipine (NORVASC) 10 MG tablet Take 5 mg by mouth daily. 06/18/18   [provider]  apixaban (ELIQUIS) 2.5 MG TABS tablet Take 2.5 mg by mouth 2 (two) times daily.    [provider]  atorvastatin (LIPITOR) 80 MG tablet Take 1 tablet (80 mg total) by mouth every evening. 07/22/14   [name],  MD  benzonatate (TESSALON) 100 MG capsule Take 1 capsule (100 mg total) by mouth every 8 (eight) hours. 06/14/20   Tedd Sias, PA  calcium acetate (PHOSLO) 667 MG capsule Take 1 capsule by mouth 2 (two) times a week. Mondays and tuursdays 11/29/17   [provider]  escitalopram (LEXAPRO) 10 MG tablet Take 10 mg by mouth daily.    [provider]  ferrous sulfate 325 (65 FE) MG tablet Take 1 tablet by mouth 2 (two) times daily. 11/09/17   [provider]  furosemide (LASIX) 20 MG tablet Take 1 tablet (20 mg total) by mouth daily. 02/15/18   Orson Eva, MD  gabapentin (NEURONTIN) 300 MG capsule Take 1 capsule by mouth daily. 01/03/18   [provider]  hydrALAZINE (APRESOLINE) 100 MG tablet Take 100 mg by mouth 3 (three) times daily.    [provider]  isosorbide dinitrate (ISORDIL) 30 MG tablet Take 30 mg by mouth 3 (three) times daily.  09/18/15   [provider]  levothyroxine (SYNTHROID, LEVOTHROID) 100 MCG tablet Take 100 mcg by mouth daily before breakfast.    [provider]  omeprazole (PRILOSEC) 20 MG capsule Take 20 mg by mouth daily.    [provider]  promethazine-dextromethorphan (PROMETHAZINE-DM) 6.25-15 MG/5ML syrup Take 1.3 mLs by mouth 4 (four) times daily as needed for cough. 06/14/20   Tedd Sias, PA  sodium bicarbonate 650 MG tablet Take 1,300 mg by mouth 2 (two) times daily.    [provider]  Vitamins A & D (VITAMIN A & D) ointment Apply 1 application topically as needed. 11/09/17   [provider]    Allergies    Ampicillin  Review of Systems   Review of Systems  All other systems reviewed and are negative.   Physical Exam Updated Vital Signs BP (!) 195/81   Pulse 72   Temp 98.1 F (36.7 C) (Oral)   Resp (!) 21   Ht 1.702 m (5\' 7" )   Wt 74.8 kg   SpO2 96%   BMI 25.84 kg/m   Physical Exam Vitals and nursing note reviewed.  Constitutional:      General: She  is not in acute distress.    Appearance: She is well-developed.  HENT:     Head: Normocephalic and atraumatic.     Mouth/Throat:     Pharynx: No oropharyngeal exudate.  Eyes:     General: No scleral icterus.       Right eye: No discharge.        Left eye: No discharge.     Conjunctiva/sclera: Conjunctivae normal.     Pupils: Pupils are equal, round, and reactive to light.  Neck:     Thyroid: No thyromegaly.     Vascular: No JVD.  Cardiovascular:     Rate and Rhythm: Normal rate and regular rhythm.     Heart sounds: Normal heart sounds. No murmur heard. No  friction rub. No gallop.   Pulmonary:     Effort: Pulmonary effort is normal. No respiratory distress.     Breath sounds: Normal breath sounds. No wheezing or rales.  Abdominal:     General: Bowel sounds are normal. There is no distension.     Palpations: Abdomen is soft. There is no mass.     Tenderness: There is no abdominal tenderness.  Musculoskeletal:        General: No tenderness. Normal range of motion.     Cervical back: Normal range of motion and neck supple.     Right lower leg: Edema present.     Left lower leg: Edema present.     Comments: Bilateral lower extremity edema about the ankles and pretibial space from the mid tibial area down.  Right is slightly greater than left  Lymphadenopathy:     Cervical: No cervical adenopathy.  Skin:    General: Skin is warm and dry.     Findings: No erythema or rash.  Neurological:     Mental Status: She is alert.     Coordination: Coordination normal.  Psychiatric:        Behavior: Behavior normal.     ED Results / Procedures / Treatments   Labs (all labs ordered are listed, but only abnormal results are displayed) Labs Reviewed  CBC - Abnormal; Notable for the following components:      Result Value   Hemoglobin 10.0 (*)    HCT 34.1 (*)    MCH 25.1 (*)    MCHC 29.3 (*)    RDW 23.8 (*)    All other components within normal limits  BASIC METABOLIC PANEL -  Abnormal; Notable for the following components:   Potassium 5.3 (*)    Chloride 112 (*)    CO2 16 (*)    Glucose, Bld 196 (*)    BUN 69 (*)    Creatinine, Ser 2.31 (*)    Calcium 8.6 (*)    GFR, Estimated 21 (*)    All other components within normal limits    EKG EKG Interpretation  Date/Time:  Wednesday June 23 2020 11:34:07 EDT Ventricular Rate:  90 PR Interval:    QRS Duration: 90 QT Interval:  407 QTC Calculation: 498 R Axis:   124 Text Interpretation: Atrial fibrillation Probable lateral infarct, old Probable anteroseptal infarct, old since last tracing, no changes seen Confirmed by Noemi Chapel [phone]) on 06/23/2020 11:45:48 AM   Radiology No results found.  Procedures Procedures   Medications Ordered in ED Medications  sodium zirconium cyclosilicate (LOKELMA) packet 5 g (has no administration in time range)    ED Course  I have reviewed the triage vital signs and the nursing notes.  Pertinent labs & imaging results that were available during my care of the patient were reviewed by me and considered in my medical decision making (see chart for details).    MDM Rules/Calculators/A&P                          This patient is in no distress, she does not appear to have any significant signs of edema outside of what I would expect, she is already anticoagulated making DVT extremely unusual.  I will check her basic metabolic panel to make sure there is no signs of pathologic renal failure or hyperkalemia.  Her EKG does not show any signs of hyperacute T waves, she is in atrial fibrillation with normal-appearing QRSs  and a nonspecific T wave abnormality which does not seem to be new.  Patient agreeable to the plan, vital signs reviewed and show hypertension but no other significant finding  Hyperkalemia at 5.3, other labs are at baseline including her creatinine which seems to be where it has been over the last year.  CBC shows no significant leukocytosis, minimal anemia,  the patient has been given Lokelma, she is stable for discharge, vital signs reflect some hypertension.  Additionally the amlodipine may be contributing to her leg swelling, she can can discuss this with her family doctor.  Final Clinical Impression(s) / ED Diagnoses Final diagnoses:  Leg edema  Hyperkalemia    Rx / DC Orders ED Discharge Orders    None       Noemi Chapel, MD 06/23/20 1303

## 2020-07-21 ENCOUNTER — Other Ambulatory Visit: Payer: Self-pay

## 2020-07-21 ENCOUNTER — Inpatient Hospital Stay (HOSPITAL_COMMUNITY)
Admission: EM | Admit: 2020-07-21 | Discharge: 2020-07-26 | DRG: 291 | Disposition: A | Discharge status: Home / Self Care | Payer: Medicare Other | Attending: Internal Medicine | Admitting: Internal Medicine

## 2020-07-21 ENCOUNTER — Encounter (HOSPITAL_COMMUNITY): Payer: Self-pay

## 2020-07-21 ENCOUNTER — Emergency Department (HOSPITAL_COMMUNITY): Payer: Medicare Other

## 2020-07-21 DIAGNOSIS — D631 Anemia in chronic kidney disease: Secondary | ICD-10-CM | POA: Diagnosis present

## 2020-07-21 DIAGNOSIS — Z8616 Personal history of COVID-19: Secondary | ICD-10-CM

## 2020-07-21 DIAGNOSIS — I130 Hypertensive heart and chronic kidney disease with heart failure and stage 1 through stage 4 chronic kidney disease, or unspecified chronic kidney disease: Secondary | ICD-10-CM | POA: Diagnosis not present

## 2020-07-21 DIAGNOSIS — E1122 Type 2 diabetes mellitus with diabetic chronic kidney disease: Secondary | ICD-10-CM | POA: Diagnosis present

## 2020-07-21 DIAGNOSIS — Z823 Family history of stroke: Secondary | ICD-10-CM

## 2020-07-21 DIAGNOSIS — R059 Cough, unspecified: Secondary | ICD-10-CM

## 2020-07-21 DIAGNOSIS — F32A Depression, unspecified: Secondary | ICD-10-CM | POA: Diagnosis present

## 2020-07-21 DIAGNOSIS — I5033 Acute on chronic diastolic (congestive) heart failure: Secondary | ICD-10-CM | POA: Diagnosis present

## 2020-07-21 DIAGNOSIS — Z79890 Hormone replacement therapy (postmenopausal): Secondary | ICD-10-CM

## 2020-07-21 DIAGNOSIS — I700 Atherosclerosis of aorta: Secondary | ICD-10-CM | POA: Diagnosis present

## 2020-07-21 DIAGNOSIS — R0602 Shortness of breath: Secondary | ICD-10-CM

## 2020-07-21 DIAGNOSIS — M7989 Other specified soft tissue disorders: Secondary | ICD-10-CM | POA: Diagnosis present

## 2020-07-21 DIAGNOSIS — Z7901 Long term (current) use of anticoagulants: Secondary | ICD-10-CM

## 2020-07-21 DIAGNOSIS — Z809 Family history of malignant neoplasm, unspecified: Secondary | ICD-10-CM

## 2020-07-21 DIAGNOSIS — E1165 Type 2 diabetes mellitus with hyperglycemia: Secondary | ICD-10-CM | POA: Diagnosis present

## 2020-07-21 DIAGNOSIS — D649 Anemia, unspecified: Secondary | ICD-10-CM | POA: Diagnosis present

## 2020-07-21 DIAGNOSIS — F419 Anxiety disorder, unspecified: Secondary | ICD-10-CM | POA: Diagnosis present

## 2020-07-21 DIAGNOSIS — I480 Paroxysmal atrial fibrillation: Secondary | ICD-10-CM | POA: Diagnosis present

## 2020-07-21 DIAGNOSIS — N183 Chronic kidney disease, stage 3 unspecified: Secondary | ICD-10-CM | POA: Diagnosis present

## 2020-07-21 DIAGNOSIS — D509 Iron deficiency anemia, unspecified: Secondary | ICD-10-CM | POA: Diagnosis present

## 2020-07-21 DIAGNOSIS — Z8673 Personal history of transient ischemic attack (TIA), and cerebral infarction without residual deficits: Secondary | ICD-10-CM

## 2020-07-21 DIAGNOSIS — N184 Chronic kidney disease, stage 4 (severe): Secondary | ICD-10-CM | POA: Diagnosis present

## 2020-07-21 DIAGNOSIS — I509 Heart failure, unspecified: Secondary | ICD-10-CM

## 2020-07-21 DIAGNOSIS — I5031 Acute diastolic (congestive) heart failure: Secondary | ICD-10-CM | POA: Diagnosis present

## 2020-07-21 DIAGNOSIS — Z79899 Other long term (current) drug therapy: Secondary | ICD-10-CM

## 2020-07-21 DIAGNOSIS — Z880 Allergy status to penicillin: Secondary | ICD-10-CM

## 2020-07-21 DIAGNOSIS — Z8701 Personal history of pneumonia (recurrent): Secondary | ICD-10-CM

## 2020-07-21 DIAGNOSIS — E785 Hyperlipidemia, unspecified: Secondary | ICD-10-CM | POA: Diagnosis present

## 2020-07-21 DIAGNOSIS — Z20822 Contact with and (suspected) exposure to covid-19: Secondary | ICD-10-CM | POA: Diagnosis present

## 2020-07-21 DIAGNOSIS — N179 Acute kidney failure, unspecified: Secondary | ICD-10-CM | POA: Diagnosis present

## 2020-07-21 DIAGNOSIS — E039 Hypothyroidism, unspecified: Secondary | ICD-10-CM | POA: Diagnosis present

## 2020-07-21 DIAGNOSIS — K7460 Unspecified cirrhosis of liver: Secondary | ICD-10-CM | POA: Diagnosis present

## 2020-07-21 DIAGNOSIS — I10 Essential (primary) hypertension: Secondary | ICD-10-CM | POA: Diagnosis present

## 2020-07-21 LAB — COMPREHENSIVE METABOLIC PANEL
ALT: 20 U/L (ref 0–44)
AST: 25 U/L (ref 15–41)
Albumin: 3 g/dL — ABNORMAL LOW (ref 3.5–5.0)
Alkaline Phosphatase: 51 U/L (ref 38–126)
Anion gap: 6 (ref 5–15)
BUN: 65 mg/dL — ABNORMAL HIGH (ref 8–23)
CO2: 24 mmol/L (ref 22–32)
Calcium: 8.1 mg/dL — ABNORMAL LOW (ref 8.9–10.3)
Chloride: 108 mmol/L (ref 98–111)
Creatinine, Ser: 1.82 mg/dL — ABNORMAL HIGH (ref 0.44–1.00)
GFR, Estimated: 28 mL/min — ABNORMAL LOW (ref >60)
Glucose, Bld: 192 mg/dL — ABNORMAL HIGH (ref 70–99)
Potassium: 4.5 mmol/L (ref 3.5–5.1)
Sodium: 138 mmol/L (ref 135–145)
Total Bilirubin: 0.1 mg/dL — ABNORMAL LOW (ref 0.3–1.2)
Total Protein: 7.2 g/dL (ref 6.5–8.1)

## 2020-07-21 LAB — LACTIC ACID, PLASMA: Lactic Acid, Venous: 0.9 mmol/L (ref 0.5–1.9)

## 2020-07-21 MED ORDER — LEVOFLOXACIN IN D5W 500 MG/100ML IV SOLN
500.0000 mg | Freq: Once | INTRAVENOUS | Status: AC
  Administered 2020-07-21: 500 mg via INTRAVENOUS
  Filled 2020-07-21: qty 100

## 2020-07-21 MED ORDER — HYDRALAZINE HCL 20 MG/ML IJ SOLN
20.0000 mg | INTRAMUSCULAR | Status: AC
  Administered 2020-07-21: 20 mg via INTRAVENOUS
  Filled 2020-07-21: qty 1

## 2020-07-21 MED ORDER — FUROSEMIDE 10 MG/ML IJ SOLN
40.0000 mg | INTRAMUSCULAR | Status: AC
  Administered 2020-07-21: 40 mg via INTRAVENOUS
  Filled 2020-07-21: qty 4

## 2020-07-21 NOTE — ED Provider Notes (Signed)
[facility] EMERGENCY DEPARTMENT Provider Note   CSN: [id number] Arrival date & time: 07/21/20  1911     History Chief Complaint  Patient presents with   Shortness of Breath    Beth Padilla is a 80 y.o. female.   Shortness of Breath  This patient is an 80 year old female, according to her medical history she has a history of recurrent lobar pneumonia, chronic diastolic heart failure uncontrolled diabetes, prior stroke, paroxysmal atrial fibrillation, chronic kidney disease stage III and hypertension.  She presents to the hospital today with a complaint shortness of breath, she states the shortness of breath has been getting worse over the last couple of days, it is associated with dyspnea on exertion and the chest discomfort, she coughs occasionally but has not had any fevers or chills.  She describes that after having COVID she has had a rather chronic shortness of breath and chronic swelling of her legs but it became much worse over the last couple of days.  She has not had any specific treatment for this episode though she recently was treated with antibiotics for pneumonia.  She has been back and forth to different hospitals including [facility].  She has not seen her cardiologist recently  Past Medical History:  Diagnosis Date   Anemia    Atrial fibrillation ([place])    Depression    Diabetes mellitus    Hyperlipidemia 12/15/2010   Hypertension    Hypothyroidism 12/15/2010   Pneumonia    Renal disorder     Patient Active Problem List   Diagnosis Date Noted   Abdominal pain 05/03/2019   Nausea & vomiting 05/03/2019   Depression 05/03/2019   Lobar pneumonia ([place]) 02/14/2018   Chronic diastolic HF (heart failure) ([place]) 02/13/2018   Anemia of chronic disease 02/13/2018   Uncontrolled type 2 diabetes mellitus with hyperglycemia ([place]) 02/13/2018   Acute respiratory failure with hypoxia ([place]) 02/13/2018   Paroxysmal atrial fibrillation ([place]) 02/19/2015   CVA (cerebral  infarction)    Elevated troponin    CKD (chronic kidney disease), stage III ([place]) 07/19/2014   Stroke ([place]) 07/18/2014   Acute renal failure superimposed on stage 3 chronic kidney disease ([place]) 09/16/2013   CHF (congestive heart failure) ([place]) 09/14/2013   Bradycardia 09/14/2013   UTI (urinary tract infection) 09/14/2013   Type 2 diabetes with nephropathy ([place]) 12/15/2010   HTN (hypertension) 12/15/2010   Anemia 12/15/2010   Hypothyroidism 12/15/2010   Hyperlipidemia 12/15/2010    Past Surgical History:  Procedure Laterality Date   ABDOMINAL HYSTERECTOMY     EP IMPLANTABLE DEVICE N/A 07/21/2014   Procedure: Loop Recorder Insertion;  Surgeon: [name], MD;  Location: [place] [facility];  Service: Cardiovascular;  Laterality: N/A;   implantable loop recorder removal  10/14/2018   MDT Reveal LINQ removed in office by Dr [name]   TEE WITHOUT CARDIOVERSION N/A 07/21/2014   Procedure: TRANSESOPHAGEAL ECHOCARDIOGRAM (TEE);  Surgeon: [name], MD;  Location: [facility] ENDOSCOPY;  Service: Cardiovascular;  Laterality: N/A;     OB History   No obstetric history on file.     Family History  Problem Relation Age of Onset   Stroke Mother    Cancer Sister     Social History   Tobacco Use   Smoking status: Never   Smokeless tobacco: Never  Substance Use Topics   Alcohol use: No   Drug use: No    Home Medications Prior to Admission medications   Medication Sig Start Date End Date Taking?  Authorizing Provider  acetaminophen (TYLENOL) 650 MG CR tablet Take 650 mg by mouth every 8 (eight) hours as needed for pain.    [provider]  acetaZOLAMIDE (DIAMOX) 250 MG tablet Take 1 tablet by mouth daily. 11/30/17   [provider]  amLODipine (NORVASC) 10 MG tablet Take 5 mg by mouth daily. 06/18/18   [provider]  apixaban (ELIQUIS) 2.5 MG TABS tablet Take 2.5 mg by mouth 2 (two) times daily.    [provider]  atorvastatin (LIPITOR) 80 MG tablet  Take 1 tablet (80 mg total) by mouth every evening. 07/22/14   [name], MD  benzonatate (TESSALON) 100 MG capsule Take 1 capsule (100 mg total) by mouth every 8 (eight) hours. 06/14/20   Tedd Sias, PA  calcium acetate (PHOSLO) 667 MG capsule Take 1 capsule by mouth 2 (two) times a week. Mondays and tuursdays 11/29/17   [provider]  escitalopram (LEXAPRO) 10 MG tablet Take 10 mg by mouth daily.    [provider]  ferrous sulfate 325 (65 FE) MG tablet Take 1 tablet by mouth 2 (two) times daily. 11/09/17   [provider]  furosemide (LASIX) 20 MG tablet Take 1 tablet (20 mg total) by mouth daily. 02/15/18   Orson Eva, MD  gabapentin (NEURONTIN) 300 MG capsule Take 1 capsule by mouth daily. 01/03/18   [provider]  hydrALAZINE (APRESOLINE) 100 MG tablet Take 100 mg by mouth 3 (three) times daily.    [provider]  isosorbide dinitrate (ISORDIL) 30 MG tablet Take 30 mg by mouth 3 (three) times daily.  09/18/15   [provider]  levothyroxine (SYNTHROID, LEVOTHROID) 100 MCG tablet Take 100 mcg by mouth daily before breakfast.    [provider]  omeprazole (PRILOSEC) 20 MG capsule Take 20 mg by mouth daily.    [provider]  promethazine-dextromethorphan (PROMETHAZINE-DM) 6.25-15 MG/5ML syrup Take 1.3 mLs by mouth 4 (four) times daily as needed for cough. 06/14/20   Tedd Sias, PA  sodium bicarbonate 650 MG tablet Take 1,300 mg by mouth 2 (two) times daily.    [provider]  Vitamins A & D (VITAMIN A & D) ointment Apply 1 application topically as needed. 11/09/17   [provider]    Allergies    Ampicillin  Review of Systems   Review of Systems  Respiratory:  Positive for shortness of breath.   All other systems reviewed and are negative.  Physical Exam Updated Vital Signs BP (!) 198/70   Pulse 76   Temp 99.2 F (37.3 C) (Oral)   Resp (!) 23   Ht 1.702 m (5\' 7" )   Wt 75.8  kg   SpO2 97%   BMI 26.16 kg/m   Physical Exam Vitals and nursing note reviewed.  Constitutional:      General: She is not in acute distress.    Appearance: She is well-developed.  HENT:     Head: Normocephalic and atraumatic.     Nose: Nose normal.     Mouth/Throat:     Mouth: Mucous membranes are moist.     Pharynx: No oropharyngeal exudate.  Eyes:     General: No scleral icterus.       Right eye: No discharge.        Left eye: No discharge.     Conjunctiva/sclera: Conjunctivae normal.     Pupils: Pupils are equal, round, and reactive to light.  Neck:  Thyroid: No thyromegaly.     Vascular: No JVD.     Comments: JVD Cardiovascular:     Rate and Rhythm: Normal rate and regular rhythm.     Heart sounds: Normal heart sounds. No murmur heard.   No friction rub. No gallop.  Pulmonary:     Effort: Pulmonary effort is normal. No respiratory distress.     Breath sounds: Normal breath sounds. No wheezing or rales.     Comments: Rales at the R base Abdominal:     General: Bowel sounds are normal. There is no distension.     Palpations: Abdomen is soft. There is no mass.     Tenderness: There is no abdominal tenderness.  Musculoskeletal:        General: No tenderness. Normal range of motion.     Cervical back: Normal range of motion and neck supple.     Right lower leg: Edema present.     Left lower leg: Edema present.     Comments: Symmetrical LE edema  Lymphadenopathy:     Cervical: No cervical adenopathy.  Skin:    General: Skin is warm and dry.     Findings: No erythema or rash.  Neurological:     General: No focal deficit present.     Mental Status: She is alert.     Coordination: Coordination normal.  Psychiatric:        Behavior: Behavior normal.    ED Results / Procedures / Treatments   Labs (all labs ordered are listed, but only abnormal results are displayed) Labs Reviewed - No data to display  EKG None  Radiology DG Chest 2 View  Result Date:  07/21/2020 CLINICAL DATA:  Shortness of breath, COVID positive 06/14/2020 EXAM: CHEST - 2 VIEW COMPARISON:  06/18/2020 FINDINGS: Patchy right lower lobe opacity, progressive from the prior, suspicious for pneumonia. Mild left basilar opacity, similar to the prior, atelectasis versus pneumonia. No pleural effusion or pneumothorax. Cardiomegaly.  Thoracic aortic atherosclerosis. IMPRESSION: Progressive right lower lobe opacity, suspicious for pneumonia. Mild left basilar opacity, atelectasis versus pneumonia. Electronically Signed   By: Julian Hy M.D.   On: 07/21/2020 19:55    Procedures Procedures   Medications Ordered in ED Medications - No data to display  ED Course  I have reviewed the triage vital signs and the nursing notes.  Pertinent labs & imaging results that were available during my care of the patient were reviewed by me and considered in my medical decision making (see chart for details).    MDM Rules/Calculators/A&P                          This patient has an abnormal exam JVD, edema, and rales -  Has been getting some fluid pills occasional, interimttent abx Needs abx, likely admit given sx and xray findings -  Labs pending - cultures, lactic and WBC pending.  BNP pending.  Further review of the medical record shows that I saw this patient approximately 1 month ago during which time I stopped her amlodipine as a thought that this may be causing some of the swelling of her legs.  It has not improved since stopping that.  Vital signs continue to show significant hypertension, last checked at 10:00 PM, 198/70, no tachycardia  Antibiotics ordered, Levaquin  At change of shift - care signed out to Dr. Dayna Barker - will need to ambulate - to determine dispo - if not having CHF - if  CHF, would suggest admission.  Final Clinical Impression(s) / ED Diagnoses Final diagnoses:  Acute on chronic congestive heart failure, unspecified heart failure type ([place])  SOB (shortness of  breath)    Rx / DC Orders ED Discharge Orders     None        Noemi Chapel, MD 07/22/20 1451

## 2020-07-21 NOTE — ED Triage Notes (Signed)
Pt. States they became short of breath this afternoon. Pt. States they feel SHOB. Denies any chest pain.

## 2020-07-22 ENCOUNTER — Observation Stay (HOSPITAL_COMMUNITY): Payer: Medicare Other

## 2020-07-22 DIAGNOSIS — E1165 Type 2 diabetes mellitus with hyperglycemia: Secondary | ICD-10-CM | POA: Diagnosis present

## 2020-07-22 DIAGNOSIS — I5033 Acute on chronic diastolic (congestive) heart failure: Secondary | ICD-10-CM | POA: Diagnosis present

## 2020-07-22 DIAGNOSIS — I10 Essential (primary) hypertension: Secondary | ICD-10-CM | POA: Diagnosis not present

## 2020-07-22 DIAGNOSIS — N184 Chronic kidney disease, stage 4 (severe): Secondary | ICD-10-CM | POA: Diagnosis present

## 2020-07-22 DIAGNOSIS — Z20822 Contact with and (suspected) exposure to covid-19: Secondary | ICD-10-CM | POA: Diagnosis present

## 2020-07-22 DIAGNOSIS — D631 Anemia in chronic kidney disease: Secondary | ICD-10-CM | POA: Diagnosis present

## 2020-07-22 DIAGNOSIS — E785 Hyperlipidemia, unspecified: Secondary | ICD-10-CM | POA: Diagnosis present

## 2020-07-22 DIAGNOSIS — E1122 Type 2 diabetes mellitus with diabetic chronic kidney disease: Secondary | ICD-10-CM | POA: Diagnosis present

## 2020-07-22 DIAGNOSIS — Z8673 Personal history of transient ischemic attack (TIA), and cerebral infarction without residual deficits: Secondary | ICD-10-CM | POA: Diagnosis not present

## 2020-07-22 DIAGNOSIS — Z880 Allergy status to penicillin: Secondary | ICD-10-CM | POA: Diagnosis not present

## 2020-07-22 DIAGNOSIS — N179 Acute kidney failure, unspecified: Secondary | ICD-10-CM | POA: Diagnosis present

## 2020-07-22 DIAGNOSIS — Z809 Family history of malignant neoplasm, unspecified: Secondary | ICD-10-CM | POA: Diagnosis not present

## 2020-07-22 DIAGNOSIS — Z7901 Long term (current) use of anticoagulants: Secondary | ICD-10-CM | POA: Diagnosis not present

## 2020-07-22 DIAGNOSIS — F32A Depression, unspecified: Secondary | ICD-10-CM | POA: Diagnosis present

## 2020-07-22 DIAGNOSIS — F419 Anxiety disorder, unspecified: Secondary | ICD-10-CM | POA: Diagnosis present

## 2020-07-22 DIAGNOSIS — I5031 Acute diastolic (congestive) heart failure: Secondary | ICD-10-CM | POA: Diagnosis not present

## 2020-07-22 DIAGNOSIS — Z823 Family history of stroke: Secondary | ICD-10-CM | POA: Diagnosis not present

## 2020-07-22 DIAGNOSIS — I480 Paroxysmal atrial fibrillation: Secondary | ICD-10-CM | POA: Diagnosis present

## 2020-07-22 DIAGNOSIS — Z8616 Personal history of COVID-19: Secondary | ICD-10-CM | POA: Diagnosis not present

## 2020-07-22 DIAGNOSIS — Z79899 Other long term (current) drug therapy: Secondary | ICD-10-CM | POA: Diagnosis not present

## 2020-07-22 DIAGNOSIS — Z8701 Personal history of pneumonia (recurrent): Secondary | ICD-10-CM | POA: Diagnosis not present

## 2020-07-22 DIAGNOSIS — K7460 Unspecified cirrhosis of liver: Secondary | ICD-10-CM | POA: Diagnosis present

## 2020-07-22 DIAGNOSIS — I130 Hypertensive heart and chronic kidney disease with heart failure and stage 1 through stage 4 chronic kidney disease, or unspecified chronic kidney disease: Secondary | ICD-10-CM | POA: Diagnosis present

## 2020-07-22 DIAGNOSIS — E039 Hypothyroidism, unspecified: Secondary | ICD-10-CM | POA: Diagnosis present

## 2020-07-22 DIAGNOSIS — I700 Atherosclerosis of aorta: Secondary | ICD-10-CM | POA: Diagnosis present

## 2020-07-22 DIAGNOSIS — D509 Iron deficiency anemia, unspecified: Secondary | ICD-10-CM | POA: Diagnosis present

## 2020-07-22 LAB — SARS CORONAVIRUS 2 (TAT 6-24 HRS): SARS Coronavirus 2: NEGATIVE

## 2020-07-22 LAB — BASIC METABOLIC PANEL
Anion gap: 9 (ref 5–15)
BUN: 70 mg/dL — ABNORMAL HIGH (ref 8–23)
CO2: 22 mmol/L (ref 22–32)
Calcium: 8.3 mg/dL — ABNORMAL LOW (ref 8.9–10.3)
Chloride: 107 mmol/L (ref 98–111)
Creatinine, Ser: 1.94 mg/dL — ABNORMAL HIGH (ref 0.44–1.00)
GFR, Estimated: 26 mL/min — ABNORMAL LOW (ref >60)
Glucose, Bld: 183 mg/dL — ABNORMAL HIGH (ref 70–99)
Potassium: 4.6 mmol/L (ref 3.5–5.1)
Sodium: 138 mmol/L (ref 135–145)

## 2020-07-22 LAB — ECHOCARDIOGRAM COMPLETE
AR max vel: 1.18 cm2
AV Area VTI: 1.12 cm2
AV Area mean vel: 1.18 cm2
AV Mean grad: 19.5 mmHg
AV Peak grad: 37 mmHg
Ao pk vel: 3.04 m/s
Area-P 1/2: 3.83 cm2
Height: 67 in
MV VTI: 1.97 cm2
P 1/2 time: 379 msec
S' Lateral: 3.07 cm
Weight: 2672 oz

## 2020-07-22 LAB — CBC WITH DIFFERENTIAL/PLATELET
Abs Immature Granulocytes: 0.02 10*3/uL (ref 0.00–0.07)
Basophils Absolute: 0 10*3/uL (ref 0.0–0.1)
Basophils Relative: 1 %
Eosinophils Absolute: 0.1 10*3/uL (ref 0.0–0.5)
Eosinophils Relative: 1 %
HCT: 23.8 % — ABNORMAL LOW (ref 36.0–46.0)
Hemoglobin: 7.3 g/dL — ABNORMAL LOW (ref 12.0–15.0)
Immature Granulocytes: 0 %
Lymphocytes Relative: 14 %
Lymphs Abs: 0.9 10*3/uL (ref 0.7–4.0)
MCH: 26.6 pg (ref 26.0–34.0)
MCHC: 30.7 g/dL (ref 30.0–36.0)
MCV: 86.9 fL (ref 80.0–100.0)
Monocytes Absolute: 0.5 10*3/uL (ref 0.1–1.0)
Monocytes Relative: 7 %
Neutro Abs: 4.8 10*3/uL (ref 1.7–7.7)
Neutrophils Relative %: 77 %
Platelets: 335 10*3/uL (ref 150–400)
RBC: 2.74 MIL/uL — ABNORMAL LOW (ref 3.87–5.11)
RDW: 23.9 % — ABNORMAL HIGH (ref 11.5–15.5)
WBC: 6.2 10*3/uL (ref 4.0–10.5)
nRBC: 0 % (ref 0.0–0.2)

## 2020-07-22 LAB — CBC
HCT: 23.3 % — ABNORMAL LOW (ref 36.0–46.0)
Hemoglobin: 7.2 g/dL — ABNORMAL LOW (ref 12.0–15.0)
MCH: 26.9 pg (ref 26.0–34.0)
MCHC: 30.9 g/dL (ref 30.0–36.0)
MCV: 86.9 fL (ref 80.0–100.0)
Platelets: 309 10*3/uL (ref 150–400)
RBC: 2.68 MIL/uL — ABNORMAL LOW (ref 3.87–5.11)
RDW: 23.7 % — ABNORMAL HIGH (ref 11.5–15.5)
WBC: 6.2 10*3/uL (ref 4.0–10.5)
nRBC: 0 % (ref 0.0–0.2)

## 2020-07-22 LAB — BRAIN NATRIURETIC PEPTIDE: B Natriuretic Peptide: 1321 pg/mL — ABNORMAL HIGH (ref 0.0–100.0)

## 2020-07-22 MED ORDER — APIXABAN 2.5 MG PO TABS
2.5000 mg | ORAL_TABLET | Freq: Two times a day (BID) | ORAL | Status: DC
  Administered 2020-07-22 – 2020-07-26 (×8): 2.5 mg via ORAL
  Filled 2020-07-22 (×8): qty 1

## 2020-07-22 MED ORDER — ONDANSETRON HCL 4 MG PO TABS
4.0000 mg | ORAL_TABLET | Freq: Four times a day (QID) | ORAL | Status: DC | PRN
  Administered 2020-07-22: 4 mg via ORAL
  Filled 2020-07-22: qty 1

## 2020-07-22 MED ORDER — ATORVASTATIN CALCIUM 40 MG PO TABS
80.0000 mg | ORAL_TABLET | Freq: Every evening | ORAL | Status: DC
  Administered 2020-07-22 – 2020-07-25 (×4): 80 mg via ORAL
  Filled 2020-07-22 (×4): qty 2

## 2020-07-22 MED ORDER — ACETAMINOPHEN 650 MG RE SUPP: 650.0000 mg | Freq: Four times a day (QID) | RECTAL | Status: DC | PRN

## 2020-07-22 MED ORDER — TRAMADOL HCL 50 MG PO TABS
50.0000 mg | ORAL_TABLET | Freq: Every day | ORAL | Status: DC | PRN
  Administered 2020-07-23: 50 mg via ORAL
  Filled 2020-07-22: qty 1

## 2020-07-22 MED ORDER — ACETAMINOPHEN 325 MG PO TABS
650.0000 mg | ORAL_TABLET | Freq: Four times a day (QID) | ORAL | Status: DC | PRN
  Administered 2020-07-24 – 2020-07-25 (×2): 650 mg via ORAL
  Filled 2020-07-22 (×2): qty 2

## 2020-07-22 MED ORDER — ONDANSETRON HCL 4 MG/2ML IJ SOLN: 4.0000 mg | Freq: Four times a day (QID) | INTRAMUSCULAR | Status: DC | PRN

## 2020-07-22 MED ORDER — HYDRALAZINE HCL 25 MG PO TABS
100.0000 mg | ORAL_TABLET | Freq: Three times a day (TID) | ORAL | Status: DC
  Administered 2020-07-22 – 2020-07-23 (×3): 100 mg via ORAL
  Filled 2020-07-22 (×4): qty 4

## 2020-07-22 MED ORDER — AMLODIPINE BESYLATE 5 MG PO TABS
5.0000 mg | ORAL_TABLET | Freq: Every day | ORAL | Status: DC
  Administered 2020-07-22 – 2020-07-26 (×5): 5 mg via ORAL
  Filled 2020-07-22 (×5): qty 1

## 2020-07-22 MED ORDER — SODIUM BICARBONATE 650 MG PO TABS
1300.0000 mg | ORAL_TABLET | Freq: Two times a day (BID) | ORAL | Status: DC
  Administered 2020-07-22 – 2020-07-26 (×8): 1300 mg via ORAL
  Filled 2020-07-22 (×8): qty 2

## 2020-07-22 MED ORDER — ISOSORBIDE DINITRATE 20 MG PO TABS
30.0000 mg | ORAL_TABLET | Freq: Three times a day (TID) | ORAL | Status: DC
  Administered 2020-07-22 – 2020-07-26 (×12): 30 mg via ORAL
  Filled 2020-07-22 (×12): qty 2

## 2020-07-22 MED ORDER — ACETAZOLAMIDE 250 MG PO TABS
250.0000 mg | ORAL_TABLET | Freq: Every day | ORAL | Status: DC
  Administered 2020-07-23 – 2020-07-26 (×4): 250 mg via ORAL
  Filled 2020-07-22 (×8): qty 1

## 2020-07-22 MED ORDER — FUROSEMIDE 10 MG/ML IJ SOLN
80.0000 mg | Freq: Two times a day (BID) | INTRAMUSCULAR | Status: DC
  Administered 2020-07-22: 80 mg via INTRAVENOUS
  Filled 2020-07-22 (×2): qty 8

## 2020-07-22 MED ORDER — HYDRALAZINE HCL 25 MG PO TABS
25.0000 mg | ORAL_TABLET | Freq: Four times a day (QID) | ORAL | Status: DC | PRN
  Administered 2020-07-22: 25 mg via ORAL
  Filled 2020-07-22: qty 1

## 2020-07-22 MED ORDER — FUROSEMIDE 10 MG/ML IJ SOLN
40.0000 mg | Freq: Two times a day (BID) | INTRAMUSCULAR | Status: DC
  Administered 2020-07-22 (×2): 40 mg via INTRAVENOUS
  Filled 2020-07-22 (×2): qty 4

## 2020-07-22 MED ORDER — AMLODIPINE BESYLATE 5 MG PO TABS
10.0000 mg | ORAL_TABLET | Freq: Once | ORAL | Status: AC
  Administered 2020-07-22: 10 mg via ORAL
  Filled 2020-07-22: qty 2

## 2020-07-22 MED ORDER — LEVOTHYROXINE SODIUM 100 MCG PO TABS
100.0000 ug | ORAL_TABLET | Freq: Every day | ORAL | Status: DC
  Administered 2020-07-23 – 2020-07-26 (×4): 100 ug via ORAL
  Filled 2020-07-22 (×4): qty 1

## 2020-07-22 MED ORDER — ESCITALOPRAM OXALATE 10 MG PO TABS
10.0000 mg | ORAL_TABLET | Freq: Every day | ORAL | Status: DC
  Administered 2020-07-22 – 2020-07-26 (×5): 10 mg via ORAL
  Filled 2020-07-22 (×5): qty 1

## 2020-07-22 MED ORDER — BUSPIRONE HCL 5 MG PO TABS
10.0000 mg | ORAL_TABLET | Freq: Two times a day (BID) | ORAL | Status: DC
  Administered 2020-07-22 – 2020-07-26 (×8): 10 mg via ORAL
  Filled 2020-07-22 (×8): qty 2

## 2020-07-22 NOTE — Progress Notes (Signed)
  Echocardiogram 2D Echocardiogram has been performed.  Beth Padilla 07/22/2020, 1:54 PM

## 2020-07-22 NOTE — Progress Notes (Signed)
[facility] hospitalist Short progress note  I have reviewed the H&P and examined the patient.  I have spoken with her daughter who is at bedside.  Subjective: Patient states she is less short of breath right now but not back to baseline.  Vitals:   07/22/20 0530 07/22/20 0600 07/22/20 1000 07/22/20 1315  BP: (!) 187/74 (!) 204/68 (!) 162/61 (!) 162/64  Pulse: 74 88 75 77  Temp:      Resp: (!) 24 18 (!) 21 (!) 23  Height:      Weight:      SpO2: 95% 96% 98% 95%  TempSrc:      BMI (Calculated):         Principal Problem:   Acute on chronic diastolic congestive heart failure    HTN (hypertension), uncontrolled -Has coarse crackles up to mid lung fields bilaterally and bilateral pedal edema-respiratory rate still in the 20s - BP 180/71 -Increase Lasix from 40 twice daily to 80 twice daily -Resume amlodipine, hydralazine, isosorbide dinitrate, acetazolamide and follow  Active Problems:   CKD stage IV - Creatinine ranges mostly from 1.8-2.2 with a GFR in the 20s --It is about 1.9 today  Anemia - Hemoglobin is typically 8-9-there is 1 reading of 10 on 06/23/2020 but could be erroneous - Hemoglobin currently is 7.2 and may be lower than her norm due to dilution - Continue to follow for now    Paroxysmal atrial fibrillation (HCC) -Continue apixaban-she is not on rate controlling medications    Depression-anxiety Continue BuSpar and Lexapro  Hypothyroidism - Continue Synthroid  Cirrhosis noted on prior imaging -Needs outpatient work-up  Debbe Odea, MD Pager: [email or web address]

## 2020-07-22 NOTE — TOC Initial Note (Signed)
Transition of Care [facility]) - Initial/Assessment Note    Patient Details  Name: Beth Padilla MRN: [id number] Date of Birth: 1940-05-03  Transition of Care [facility]) CM/SW Contact:    Iona Beard, [place] Phone Number: 07/22/2020, 10:06 AM  Clinical Narrative:                 CSW spoke with pts son to complete assessment. Pt lives with one of her sons. Pt is independent in completing her ADLs. Pt does not drive but family is able to provide transportation when needed. Pt does not have [place] services but family would be interested if recommended. Pt uses a walker. TOC was unable to complete CHF screen at this time due to pts son being unable to answer those questions, TOC to attempt to reach pt again. TOC to follow.   Expected Discharge Plan: Home/Self Care Barriers to Discharge: Continued Medical Work up   Patient Goals and CMS Choice Patient states their goals for this hospitalization and ongoing recovery are:: Return home CMS Medicare.gov Compare Post Acute Care list provided to:: Patient Choice offered to / list presented to : Patient  Expected Discharge Plan and Services Expected Discharge Plan: Home/Self Care In-house Referral: Clinical Social Work Discharge Planning Services: CM Consult   Living arrangements for the past 2 months: Single Family Home                                      Prior Living Arrangements/Services Living arrangements for the past 2 months: Single Family Home Lives with:: Adult Children Patient language and need for interpreter reviewed:: Yes Do you feel safe going back to the place where you live?: Yes      Need for Family Participation in Patient Care: Yes (Comment) Care giver support system in place?: Yes (comment) Current home services: DME Criminal Activity/Legal Involvement Pertinent to Current Situation/Hospitalization: No - Comment as needed  Activities of Daily Living      Permission Sought/Granted                   Emotional Assessment Appearance:: Appears stated age Attitude/Demeanor/Rapport: Engaged Affect (typically observed): Accepting Orientation: : Oriented to Self, Oriented to  Time, Oriented to Place, Oriented to Situation Alcohol / Substance Use: Not Applicable Psych Involvement: No (comment)  Admission diagnosis:  Acute on chronic diastolic congestive heart failure ([place]) [I50.33] Patient Active Problem List   Diagnosis Date Noted   Acute on chronic diastolic congestive heart failure ([place]) 07/22/2020   Abdominal pain 05/03/2019   Nausea & vomiting 05/03/2019   Depression 05/03/2019   Lobar pneumonia ([place]) 02/14/2018   Chronic diastolic HF (heart failure) ([place]) 02/13/2018   Anemia of chronic disease 02/13/2018   Uncontrolled type 2 diabetes mellitus with hyperglycemia ([place]) 02/13/2018   Acute respiratory failure with hypoxia ([place]) 02/13/2018   Paroxysmal atrial fibrillation ([place]) 02/19/2015   CVA (cerebral infarction)    Elevated troponin    CKD (chronic kidney disease), stage III ([place]) 07/19/2014   Stroke ([place]) 07/18/2014   Acute renal failure superimposed on stage 3 chronic kidney disease ([place]) 09/16/2013   CHF (congestive heart failure) ([place]) 09/14/2013   Bradycardia 09/14/2013   UTI (urinary tract infection) 09/14/2013   Type 2 diabetes with nephropathy ([place]) 12/15/2010   HTN (hypertension) 12/15/2010   Normocytic anemia 12/15/2010   Hypothyroidism 12/15/2010   Hyperlipidemia 12/15/2010   PCP:  Kendrick Ranch, MD  Pharmacy:   [id number] [place], [address] [place] [address] Phone: [phone] Fax: [phone]  [facility]  ([place] - [place], [address] [place] [address] Phone: [phone] Fax: [phone]     Social Determinants of Health (SDOH) Interventions    Readmission Risk Interventions No flowsheet data found.

## 2020-07-22 NOTE — Care Management Obs Status (Signed)
[place] NOTIFICATION   Patient Details  Name: Beth Padilla MRN: [id number] Date of Birth: 11-17-1940   Medicare Observation Status Notification Given:  Yes (mailed to address on file, spoke with son telephonically to explain letter)    [name] 07/22/2020, 2:15 PM

## 2020-07-22 NOTE — ED Provider Notes (Signed)
12:28 AM Assumed care from Dr. Sabra Heck, please see their note for full history, physical and decision making until this point. In brief this is a 80 y.o. year old female who presented to the ED tonight with Shortness of Breath     Pending labs. Likely pneumonia on CXR but also could be CHF exacerbation with h/o same. Depending on labs for disposition.   Labs c/w CHF. Patient also with mild anemia but no complaints of bleeding. Is still tachypnenic. Not a lot of UOP w/ lasix already ordered, d/w hospitalist for admission.   Labs, studies and imaging reviewed by myself and considered in medical decision making if ordered. Imaging interpreted by radiology.  Labs Reviewed  CBC WITH DIFFERENTIAL/PLATELET - Abnormal; Notable for the following components:      Result Value   RBC 2.74 (*)    Hemoglobin 7.3 (*)    HCT 23.8 (*)    RDW 23.9 (*)    All other components within normal limits  COMPREHENSIVE METABOLIC PANEL - Abnormal; Notable for the following components:   Glucose, Bld 192 (*)    BUN 65 (*)    Creatinine, Ser 1.82 (*)    Calcium 8.1 (*)    Albumin 3.0 (*)    Total Bilirubin 0.1 (*)    GFR, Estimated 28 (*)    All other components within normal limits  BRAIN NATRIURETIC PEPTIDE - Abnormal; Notable for the following components:   B Natriuretic Peptide 1,321.0 (*)    All other components within normal limits  CULTURE, BLOOD (ROUTINE X 2)  CULTURE, BLOOD (ROUTINE X 2)  SARS CORONAVIRUS 2 (TAT 6-24 HRS)  LACTIC ACID, PLASMA    DG Chest 2 View  Final Result      No follow-ups on file.    Ozell Juhasz, Corene Cornea, MD 07/22/20 [phone]

## 2020-07-22 NOTE — ED Notes (Signed)
Patient's pure wick dislodged and patient was wet with urine. Patient cleaned and changed into hospital gown. Patient repositioned in bed. Son at bed side at this time.

## 2020-07-22 NOTE — Progress Notes (Signed)
Family states the patient take Escitalopram for anxiety and sleep at night.

## 2020-07-22 NOTE — H&P (Signed)
History and Physical    MARGREE GIMBEL EPP:[id number] DOB: 10-Dec-1940 DOA: 07/21/2020  PCP: Kendrick Ranch, MD   Patient coming from: Home.  I have personally briefly reviewed patient's old medical records in [place]  Chief Complaint: Shortness of breath.  HPI: Beth Padilla is a 80 y.o. female with medical history significant of chronic blood loss anemia, paroxysmal atrial fibrillation, depression, type 2 diabetes, hyperlipidemia, hypertension, hypothyroidism, history of pneumonia, stage IIIb CKD who is coming to the emergency department due to progressively worsening dyspnea associated with bilateral lower extremity edema, orthopnea and whitish phlegm occasionally productive cough since 2 days ago.  She denies chest pain, dizziness or palpitations.  No fever, rhinorrhea or sore throat, but states he has had some chills.  Denied wheezing or hemoptysis.  No abdominal pain, nausea, emesis, diarrhea, constipation, melena or hematochezia.  No dysuria, frequency or hematuria.  No polyuria, polydipsia, polyphagia or blurred vision.  ED Course: Initial vital signs were temperature 99.2 F, pulse 75, respiration 20, BP 181/81 mmHg and O2 sat 97% on room air.  The patient received 40 mg of furosemide, 20 mg of hydralazine IVP and 500 mg of Levaquin IVPB.  Lab work: CBC showed a white count of 6.2, hemoglobin 7.3 g/dL (was 10.0 g/dL 26 days ago) and platelets 335.  BNP was 1321.0 pg/mL.  CMP showed normal electrolytes when calcium was corrected to an albumin of 3.0 g/dL.  The rest of the hepatic functions are normal.  Glucose 192, BUN 65 and creatinine 1.82 mg/dL (lower than recent baseline).  Imaging: A 2 view chest radiograph showed cardiomegaly, but no effusions.  There is progressive right lower lobe opacity, suspicious for pneumonia.  There is mild left basilar opacity, atelectasis versus pneumonia.  Please see images and full detailed report from radiology for more  information.  Review of Systems: As per HPI otherwise all other systems reviewed and are negative.  Past Medical History:  Diagnosis Date   Anemia    Atrial fibrillation ([place])    Depression    Diabetes mellitus    Hyperlipidemia 12/15/2010   Hypertension    Hypothyroidism 12/15/2010   Pneumonia    Renal disorder    Past Surgical History:  Procedure Laterality Date   ABDOMINAL HYSTERECTOMY     EP IMPLANTABLE DEVICE N/A 07/21/2014   Procedure: Loop Recorder Insertion;  Surgeon: [name], MD;  Location: [place] [facility];  Service: Cardiovascular;  Laterality: N/A;   implantable loop recorder removal  10/14/2018   MDT Reveal LINQ removed in office by Dr [name]   TEE WITHOUT CARDIOVERSION N/A 07/21/2014   Procedure: TRANSESOPHAGEAL ECHOCARDIOGRAM (TEE);  Surgeon: [name], MD;  Location: [facility];  Service: Cardiovascular;  Laterality: N/A;   Social History  reports that she has never smoked. She has never used smokeless tobacco. She reports that she does not drink alcohol and does not use drugs.  Allergies  Allergen Reactions   Ampicillin Rash   Family History  Problem Relation Age of Onset   Stroke Mother    Cancer Sister    Prior to Admission medications   Medication Sig Start Date End Date Taking? Authorizing Provider  acetaminophen (TYLENOL) 650 MG CR tablet Take 650 mg by mouth every 8 (eight) hours as needed for pain.    [provider]  acetaZOLAMIDE (DIAMOX) 250 MG tablet Take 1 tablet by mouth daily. 11/30/17   [provider]  amLODipine (NORVASC) 10 MG tablet Take 5 mg  by mouth daily. 06/18/18   [provider]  apixaban (ELIQUIS) 2.5 MG TABS tablet Take 2.5 mg by mouth 2 (two) times daily.    [provider]  atorvastatin (LIPITOR) 80 MG tablet Take 1 tablet (80 mg total) by mouth every evening. 07/22/14   [name], MD  benzonatate (TESSALON) 100 MG capsule Take 1 capsule (100 mg total) by mouth every 8 (eight)  hours. 06/14/20   Tedd Sias, PA  calcium acetate (PHOSLO) 667 MG capsule Take 1 capsule by mouth 2 (two) times a week. Mondays and tuursdays 11/29/17   [provider]  escitalopram (LEXAPRO) 10 MG tablet Take 10 mg by mouth daily.    [provider]  ferrous sulfate 325 (65 FE) MG tablet Take 1 tablet by mouth 2 (two) times daily. 11/09/17   [provider]  furosemide (LASIX) 20 MG tablet Take 1 tablet (20 mg total) by mouth daily. 02/15/18   Orson Eva, MD  gabapentin (NEURONTIN) 300 MG capsule Take 1 capsule by mouth daily. 01/03/18   [provider]  hydrALAZINE (APRESOLINE) 100 MG tablet Take 100 mg by mouth 3 (three) times daily.    [provider]  isosorbide dinitrate (ISORDIL) 30 MG tablet Take 30 mg by mouth 3 (three) times daily.  09/18/15   [provider]  levothyroxine (SYNTHROID, LEVOTHROID) 100 MCG tablet Take 100 mcg by mouth daily before breakfast.    [provider]  omeprazole (PRILOSEC) 20 MG capsule Take 20 mg by mouth daily.    [provider]  promethazine-dextromethorphan (PROMETHAZINE-DM) 6.25-15 MG/5ML syrup Take 1.3 mLs by mouth 4 (four) times daily as needed for cough. 06/14/20   Tedd Sias, PA  sodium bicarbonate 650 MG tablet Take 1,300 mg by mouth 2 (two) times daily.    [provider]  Vitamins A & D (VITAMIN A & D) ointment Apply 1 application topically as needed. 11/09/17   [provider]   Physical Exam: Vitals:   07/21/20 1921 07/21/20 1921 07/21/20 2200 07/21/20 2330  BP:  (!) 181/81 (!) 198/70 (!) 187/68  Pulse:  75 76 79  Resp:  20 (!) 23 (!) 26  Temp:  99.2 F (37.3 C)    TempSrc:  Oral    SpO2: 98% 97% 97% 98%  Weight: 75.8 kg     Height: 5\' 7"  (1.702 m)      Constitutional: NAD, calm, comfortable Eyes: PERRL, lids and conjunctivae normal ENMT: Mucous membranes are moist. Posterior pharynx clear of any exudate or lesions. Neck: Positive JVD,  supple, no masses, no thyromegaly Respiratory: Bibasilar Rales, no wheezing, no crackles. Normal respiratory effort. No accessory muscle use.  Cardiovascular: Regular rate and rhythm, no murmurs / rubs / gallops.  3+ bilateral lower extremities pitting edema. 2+ pedal pulses. No carotid bruits.  Abdomen: Obese, no distention.  Bowel sounds positive.  Soft, no tenderness, no masses palpated. No hepatosplenomegaly.  Musculoskeletal: no clubbing / cyanosis. Good ROM, no contractures. Normal muscle tone.  Skin: no acute rashes, lesions, ulcers on very limited dermatological examination. Neurologic: CN 2-12 grossly intact. Sensation intact, DTR normal. Strength 5/5 in all 4.  Psychiatric: Normal judgment and insight. Alert and oriented x 3. Normal mood.   Labs on Admission: I have personally reviewed following labs and imaging studies  CBC: Recent Labs  Lab 07/21/20 2258  WBC 6.2  NEUTROABS 4.8  HGB 7.3*  HCT 23.8*  MCV 86.9  PLT 242   Basic Metabolic Panel:  Recent Labs  Lab 07/21/20 2258  NA 138  K 4.5  CL 108  CO2 24  GLUCOSE 192*  BUN 65*  CREATININE 1.82*  CALCIUM 8.1*   GFR: Estimated Creatinine Clearance: 25.8 mL/min (A) (by C-G formula based on SCr of 1.82 mg/dL (H)).  Liver Function Tests: Recent Labs  Lab 07/21/20 2258  AST 25  ALT 20  ALKPHOS 51  BILITOT 0.1*  PROT 7.2  ALBUMIN 3.0*   Radiological Exams on Admission: DG Chest 2 View  Result Date: 07/21/2020 CLINICAL DATA:  Shortness of breath, COVID positive 06/14/2020 EXAM: CHEST - 2 VIEW COMPARISON:  06/18/2020 FINDINGS: Patchy right lower lobe opacity, progressive from the prior, suspicious for pneumonia. Mild left basilar opacity, similar to the prior, atelectasis versus pneumonia. No pleural effusion or pneumothorax. Cardiomegaly.  Thoracic aortic atherosclerosis. IMPRESSION: Progressive right lower lobe opacity, suspicious for pneumonia. Mild left basilar opacity, atelectasis versus pneumonia.  Electronically Signed   By: Julian Hy M.D.   On: 07/21/2020 19:55    02/14/2018 echocardiogram IMPRESSIONS:   1. The left ventricle has normal systolic function of 44-03%. The cavity  size is normal. There is mild left ventricular wall thickness. Echo  evidence of Unable to assess due to atrial fibrillation diastolic filling  patterns. Elevated left ventricular  end-diastolic pressure. The left ventricular diastology could not be  evaluatedsecondary to atrial fibrillation.   2. Severely dilated left atrial size.   3. Mildly dilated right atrial size.   4. The mitral valve is myxomatous. There is mild thickening. There is  mild mitral annular calcification present. Regurgitation is mild by color  flow Doppler.   5. Normal tricuspid valve.   6. Tricuspid regurgitation mild-moderate.   7. The aortic valve tricuspid. There is mild thickening and mild  calcification of the aortic valve. Aortic valve regurgitation is mild by  color flow Doppler. The calculated aortic valve area is 0.97 cm  consistent with moderate stenosis stenosis.   8. Moderate aortic annular calcification.   9. The aortic root is normal is size and structure.  10. The inferior vena cava was dilated in size with <50% respiratory  variablity.  11. No atrial level shunt detected by color flow Doppler.   EKG: Independently reviewed.  Vent. rate 79 BPM PR interval * ms QRS duration 89 ms QT/QTcB 416/477 ms P-R-T axes * 97 -41 Atrial fibrillation Right axis deviation Borderline repolarization abnormality  Assessment/Plan Principal Problem:   Acute on chronic diastolic congestive heart failure (HCC) Observation/telemetry. Continue supplemental oxygen. Fluid and sodium restriction. Continue furosemide 40 mg IVP twice daily. Monitor daily weights, intake and output. Follow renal function electrolytes. ACE inhibitor deferred due to renal insufficiency. No beta-blocker while acutely decompensated. Check  echocardiogram.  Active Problems:   CKD (chronic kidney disease), stage III (HCC) Monitor renal function electrolytes.    HTN (hypertension) Continue amlodipine 5 mg p.o. daily. Hydralazine 25 mg every 4 hours as needed. Monitor blood pressure, renal function electrolytes.    Normocytic anemia Three previous anemia panel with iron deficiency    Hypothyroidism Continue levothyroxine pending med reconciliation.    Hyperlipidemia/aortic atherosclerosis Continue atorvastatin after med rec performed.    Paroxysmal atrial fibrillation (HCC) CHA?DS?-VASc Score of at least 7. On apixaban 5 mg p.o. twice daily.    Uncontrolled type 2 diabetes mellitus with hyperglycemia (HCC) Carbohydrate modified diet. CBG monitoring with RI SS. Check hemoglobin A1c.    Depression Continue Lexapro after med rec confirmed. Follow-up with PCP and behavioral health  as an outpatient.   DVT prophylaxis: On apixaban. Code Status:   Full code. Family Communication:   Disposition Plan:   Patient is from:  Home.  Anticipated DC to:  Home.  Anticipated DC date:  07/23/2020.  Anticipated DC barriers: Clinical status.  Consults called:   Admission status:  Observation/telemetry.   Severity of Illness: High severity after presenting with significant dyspnea in the setting of acute on chronic diastolic CHF and worsening chronic anemia.  The patient will need to remain in observation for close monitoring and treatment for 24 to 48 hours.  Reubin Milan MD [facility]  How to contact the [facility] Attending or Consulting provider [place] or covering provider during after hours [place], for this patient?   Check the care team in [facility] and look for a) attending/consulting TRH provider listed and b) the [facility] team listed Log into [email or web address] and use [place]'s universal password to access. If you do not have the password, please contact the hospital operator. Locate the [facility] provider you are looking  for under [facility] and page to a number that you can be directly reached. If you still have difficulty reaching the provider, please page the [facility] (Director on Call) for the Hospitalists listed on amion for assistance.  07/22/2020, 1:12 AM   This document was prepared using Dragon voice recognition software and may contain some unintended transcription errors.

## 2020-07-23 DIAGNOSIS — N179 Acute kidney failure, unspecified: Secondary | ICD-10-CM

## 2020-07-23 LAB — BASIC METABOLIC PANEL
Anion gap: 8 (ref 5–15)
BUN: 68 mg/dL — ABNORMAL HIGH (ref 8–23)
CO2: 24 mmol/L (ref 22–32)
Calcium: 8.1 mg/dL — ABNORMAL LOW (ref 8.9–10.3)
Chloride: 107 mmol/L (ref 98–111)
Creatinine, Ser: 2.26 mg/dL — ABNORMAL HIGH (ref 0.44–1.00)
GFR, Estimated: 21 mL/min — ABNORMAL LOW (ref >60)
Glucose, Bld: 166 mg/dL — ABNORMAL HIGH (ref 70–99)
Potassium: 4.8 mmol/L (ref 3.5–5.1)
Sodium: 139 mmol/L (ref 135–145)

## 2020-07-23 MED ORDER — HYDRALAZINE HCL 25 MG PO TABS
50.0000 mg | ORAL_TABLET | Freq: Three times a day (TID) | ORAL | Status: DC
  Administered 2020-07-23 – 2020-07-26 (×9): 50 mg via ORAL
  Filled 2020-07-23 (×8): qty 2

## 2020-07-23 NOTE — Progress Notes (Signed)
SATURATION QUALIFICATIONS: (This note is used to comply with regulatory documentation for home oxygen)  Patient Saturations on Room Air at Rest = 100%  Patient Saturations on Room Air while Ambulating = 100%  Pt ambulated 50 ft with standby assist and using FWW for stability.Tolerated well, no SOB or resp difficulty, no lightheadedness or dizziness. HR up to 105 bpm with ambulation but recovered to 80 bpm within 2 minutes of rest.

## 2020-07-23 NOTE — TOC Progression Note (Signed)
Transition of Care [facility]) - Progression Note    Patient Details  Name: Beth Padilla MRN: [id number] Date of Birth: Dec 11, 1940  [facility] CM/SW [place], [place] Phone Number: 07/23/2020, 9:45 AM  Clinical Narrative:    CSW completed CHF screen with pt. Pt states that she takes all medications as prescribed. Pt also follows a heart healthy diet. Pt states that she weighs every other day, CSW educated pt on importance of daily weights and when to reach out to her cardiologist. Pt states that she is followed in cardiology by Dr. Janace Aris. TOC  to follow.   Expected Discharge Plan: Home/Self Care Barriers to Discharge: Continued Medical Work up  Expected Discharge Plan and Services Expected Discharge Plan: Home/Self Care In-house Referral: Clinical Social Work Discharge Planning Services: CM Consult   Living arrangements for the past 2 months: Single Family Home                                       Social Determinants of Health (SDOH) Interventions    Readmission Risk Interventions No flowsheet data found.

## 2020-07-23 NOTE — Progress Notes (Signed)
PROGRESS NOTE    Beth Padilla   IEP:[id number]  DOB: 11/29/1940  DOA: 07/21/2020 PCP: Kendrick Ranch, MD   Brief Narrative:  Beth Kida Florenceis a 80 y.o. female with medical history significant of chronic blood loss anemia, paroxysmal atrial fibrillation, AAA, depression, type 2 diabetes, hyperlipidemia, hypertension, hypothyroidism, history of pneumonia, stage IIIb CKD who is coming to the emergency department due to progressively worsening dyspnea associated with bilateral lower extremity edema, orthopnea and whitish phlegm occasionally productive cough for the past 2 days.  No complaints of fevers or chills.  Seen in the ED on 06/14/2020 and found to have COVID-chest x-ray showed multifocal pneumonia.  She had had symptoms for about 3 weeks and was not admitted to the hospital.  She was placed on doxycycline and antitussives and discharged home.  She was seen in the ED on 6/3 for shortness of breath which was thought to be related to COVID.  Chest x-ray showed unchanged multifocal pneumonia likely secondary to COVID infection.  Was the ED on 06/23/2020 for swelling of her ankles.  She was noted to have a potassium of 5.3 and given Lokelma and discharged.  She was asked to follow-up with her primary care doctor.  Subjective: Shortness of breath is improving but she is not quite back to baseline    Assessment & Plan: Principal Problem:   Acute on chronic diastolic congestive heart failure    HTN (hypertension), uncontrolled -Home medication includes 40 mg of Lasix daily -Has coarse crackles up to mid lung fields bilaterally and bilateral pedal edema-respiratory rate still in the 20s - BP 180/71 -Increased Lasix from 40 twice daily to 80 twice daily yesterday -Resumed amlodipine, hydralazine, isosorbide dinitrate, acetazolamide   - She still has coarse crackles at bases today but creatinine is rising and so I will hold her Lasix suspect crackles may be residual from  COVID-related pneumonia COVID-19 positive test (U07.1, COVID-19) with Acute Pneumonia (J12.89, Other viral pneumonia) (If respiratory failure or sepsis present, add as separate assessment) - BP slightly low-we will decrease hydralazine from 100-50 -I have educated her about heart failure-she states she did not know she had heart failure and drinks a lot of water every day - I have discussed a fluid restriction-have also asked dietary to speak with her   Active Problems:     - AKI on CKD stage IV - Creatinine ranges mostly from 1.8-2.2 with a GFR in the 20s --Creatinine has increased from 1.82-2.26  - recheck in AM   Anemia - Hemoglobin is typically 8-9- -Hemoglobin is about 7.2 today-check Hemoccult and anemia panel     Paroxysmal atrial fibrillation (HCC) -Continue apixaban-she is not on rate controlling medications     Depression-anxiety Continue BuSpar and Lexapro  Hypothyroidism - Continue Synthroid   Cirrhosis noted on prior imaging -Needs outpatient work-up    Time spent in minutes: 35 DVT prophylaxis: apixaban (ELIQUIS) tablet 2.5 mg Start: 07/22/20 2200 apixaban (ELIQUIS) tablet 2.5 mg   Code Status: Full code Family Communication:  Level of Care: Level of care: Telemetry Disposition Plan:  Status is: Inpatient  Remains inpatient appropriate because:Inpatient level of care appropriate due to severity of illness  Dispo: The patient is from: Home              Anticipated d/c is to: Home              Patient currently is not medically stable to d/c.   Difficult to place patient No  Consultants:  none Procedures:  None Antimicrobials:  Anti-infectives (From admission, onward)    Start     Dose/Rate Route Frequency Ordered Stop   07/21/20 2330  levofloxacin (LEVAQUIN) IVPB 500 mg        500 mg 100 mL/hr over 60 Minutes Intravenous  Once 07/21/20 2328 07/22/20 0047        Objective: Vitals:   07/23/20 0557 07/23/20 1118 07/23/20 1309  07/23/20 1600  BP: 133/62 (!) 164/71 (!) 111/55 115/62  Pulse: 75 78 72 73  Resp: 18 18 18 18   Temp: 98.7 F (37.1 C)  98.8 F (37.1 C)   TempSrc: Oral  Oral   SpO2: 93% 99% 99%   Weight:      Height:        Intake/Output Summary (Last 24 hours) at 07/23/2020 1748 Last data filed at 07/23/2020 1300 Gross per 24 hour  Intake 780 ml  Output 600 ml  Net 180 ml   Filed Weights   07/21/20 1921  Weight: 75.8 kg    Examination: General exam: Appears comfortable  HEENT: PERRLA, oral mucosa moist, no sclera icterus or thrush Respiratory system: Crackles at bases respiratory effort normal. Cardiovascular system: S1 & S2 heard, RRR.   Gastrointestinal system: Abdomen soft, non-tender, nondistended. Normal bowel sounds. Central nervous system: Alert and oriented. No focal neurological deficits. Extremities: No cyanosis, clubbing or edema Skin: No rashes or ulcers Psychiatry:  Mood & affect appropriate.     Data Reviewed: I have personally reviewed following labs and imaging studies  CBC: Recent Labs  Lab 07/21/20 2258 07/22/20 0455  WBC 6.2 6.2  NEUTROABS 4.8  --   HGB 7.3* 7.2*  HCT 23.8* 23.3*  MCV 86.9 86.9  PLT 335 035   Basic Metabolic Panel: Recent Labs  Lab 07/21/20 2258 07/22/20 0455 07/23/20 0459  NA 138 138 139  K 4.5 4.6 4.8  CL 108 107 107  CO2 24 22 24   GLUCOSE 192* 183* 166*  BUN 65* 70* 68*  CREATININE 1.82* 1.94* 2.26*  CALCIUM 8.1* 8.3* 8.1*   GFR: Estimated Creatinine Clearance: 20.7 mL/min (A) (by C-G formula based on SCr of 2.26 mg/dL (H)). Liver Function Tests: Recent Labs  Lab 07/21/20 2258  AST 25  ALT 20  ALKPHOS 51  BILITOT 0.1*  PROT 7.2  ALBUMIN 3.0*   No results for input(s): LIPASE, AMYLASE in the last 168 hours. No results for input(s): AMMONIA in the last 168 hours. Coagulation Profile: No results for input(s): INR, PROTIME in the last 168 hours. Cardiac Enzymes: No results for input(s): CKTOTAL, CKMB, CKMBINDEX,  TROPONINI in the last 168 hours. BNP (last 3 results) No results for input(s): PROBNP in the last 8760 hours. HbA1C: No results for input(s): HGBA1C in the last 72 hours. CBG: No results for input(s): GLUCAP in the last 168 hours. Lipid Profile: No results for input(s): CHOL, HDL, LDLCALC, TRIG, CHOLHDL, LDLDIRECT in the last 72 hours. Thyroid Function Tests: No results for input(s): TSH, T4TOTAL, FREET4, T3FREE, THYROIDAB in the last 72 hours. Anemia Panel: No results for input(s): VITAMINB12, FOLATE, FERRITIN, TIBC, IRON, RETICCTPCT in the last 72 hours. Urine analysis:    Component Value Date/Time   COLORURINE STRAW (A) 02/13/2018 [place] 02/13/2018 1705   LABSPEC 1.006 02/13/2018 [place] 7.0 02/13/2018 1705   GLUCOSEU NEGATIVE 02/13/2018 1705   HGBUR NEGATIVE 02/13/2018 1705   BILIRUBINUR NEGATIVE 02/13/2018 [place] 02/13/2018 [place]  30 (A) 02/13/2018 1705   UROBILINOGEN 0.2 07/18/2014 2120   NITRITE NEGATIVE 02/13/2018 1705   LEUKOCYTESUR SMALL (A) 02/13/2018 1705   Sepsis Labs: @LABRCNTIP (procalcitonin:4,lacticidven:4) ) Recent Results (from the past 240 hour(s))  Culture, blood (Routine X 2) w Reflex to ID Panel     Status: None (Preliminary result)   Collection Time: 07/21/20 10:58 PM   Specimen: BLOOD  Result Value Ref Range Status   Specimen Description BLOOD RIGHT ANTECUBITAL  Final   Special Requests   Final    BOTTLES DRAWN AEROBIC AND ANAEROBIC Blood Culture adequate volume   Culture   Final    NO GROWTH 2 DAYS Performed at [facility], [address], [place], [place] [address]    Report Status PENDING  Incomplete  Culture, blood (Routine X 2) w Reflex to ID Panel     Status: None (Preliminary result)   Collection Time: 07/21/20 11:19 PM   Specimen: BLOOD RIGHT FOREARM  Result Value Ref Range Status   Specimen Description BLOOD RIGHT FOREARM  Final   Special Requests   Final    BOTTLES DRAWN AEROBIC  AND ANAEROBIC Blood Culture adequate volume   Culture   Final    NO GROWTH 2 DAYS Performed at [facility], [address], [place], [place] [address]    Report Status PENDING  Incomplete  SARS CORONAVIRUS 2 (TAT 6-24 HRS) Nasopharyngeal Nasopharyngeal Swab     Status: None   Collection Time: 07/22/20 12:15 AM   Specimen: Nasopharyngeal Swab  Result Value Ref Range Status   SARS Coronavirus 2 NEGATIVE NEGATIVE Final    Comment: (NOTE) SARS-CoV-2 target nucleic acids are NOT DETECTED.  The SARS-CoV-2 RNA is generally detectable in upper and lower respiratory specimens during the acute phase of infection. Negative results do not preclude SARS-CoV-2 infection, do not rule out co-infections with other pathogens, and should not be used as the sole basis for treatment or other patient management decisions. Negative results must be combined with clinical observations, patient history, and epidemiological information. The expected result is Negative.  Fact Sheet for Patients: SugarRoll.be  Fact Sheet for Healthcare Providers: https://www.woods-mathews.com/  This test is not yet approved or cleared by the [place] FDA and  has been authorized for detection and/or diagnosis of SARS-CoV-2 by FDA under an Emergency Use Authorization (EUA). This EUA will remain  in effect (meaning this test can be used) for the duration of the COVID-19 declaration under Se ction 564(b)(1) of the Act, 21 U.S.C. section 360bbb-3(b)(1), unless the authorization is terminated or revoked sooner.  Performed at [facility], [place] [address], [place], [place] [address]          Radiology Studies: DG Chest 2 View  Result Date: 07/21/2020 CLINICAL DATA:  Shortness of breath, COVID positive 06/14/2020 EXAM: CHEST - 2 VIEW COMPARISON:  06/18/2020 FINDINGS: Patchy right lower lobe opacity, progressive from the prior, suspicious for pneumonia. Mild left basilar  opacity, similar to the prior, atelectasis versus pneumonia. No pleural effusion or pneumothorax. Cardiomegaly.  Thoracic aortic atherosclerosis. IMPRESSION: Progressive right lower lobe opacity, suspicious for pneumonia. Mild left basilar opacity, atelectasis versus pneumonia. Electronically Signed   By: Julian Hy M.D.   On: 07/21/2020 19:55   ECHOCARDIOGRAM COMPLETE  Result Date: 07/22/2020    ECHOCARDIOGRAM REPORT   Patient Name:   MARESSA APOLLO Date of Exam: 07/22/2020 Medical Rec #:  [id number]          Height:  67.0 in Accession #:    [id number]         Weight:       167.0 lb Date of Birth:  1941-01-16          BSA:          1.873 m Patient Age:    38 years           BP:           204/68 mmHg Patient Gender: F                  HR:           88 bpm. Exam Location:  [facility] Procedure: 2D Echo, Cardiac Doppler and Color Doppler Indications:    CHF Acute Diastolic  History:        Patient has prior history of Echocardiogram examinations, most                 recent 02/14/2018. CHF, Stroke, Arrythmias:Atrial Fibrillation;                 Risk Factors:Hypertension and Diabetes.  Sonographer:    Wenda Low Referring Phys: [id number] [place]  1. Left ventricular ejection fraction, by estimation, is 55 to 60%. The left ventricle has normal function. The left ventricle has no regional wall motion abnormalities. There is mild left ventricular hypertrophy. Left ventricular diastolic parameters are indeterminate.  2. Right ventricular systolic function is mildly reduced. The right ventricular size is mildly enlarged. Mildly increased right ventricular wall thickness.  3. Left atrial size was moderately dilated.  4. Right atrial size was mildly dilated.  5. Trivial mitral valve regurgitation.  6. AV is thickened, calcified with restricted motion. Peak and mean pressures through the valve are 37 and 20 mm Hg resepctively AVA (VTI) is 1.2 cm2. Dimentionless index is 0.39  consistent with moderate aortic stenosis. Since echo from Jan 2020 there is no significant change.. Aortic valve regurgitation is mild.  7. The inferior vena cava is dilated in size with <50% respiratory variability, suggesting right atrial pressure of 15 mmHg. FINDINGS  Left Ventricle: Left ventricular ejection fraction, by estimation, is 55 to 60%. The left ventricle has normal function. The left ventricle has no regional wall motion abnormalities. The left ventricular internal cavity size was normal in size. There is  mild left ventricular hypertrophy. Left ventricular diastolic parameters are indeterminate. Right Ventricle: The right ventricular size is mildly enlarged. Mildly increased right ventricular wall thickness. Right ventricular systolic function is mildly reduced. Left Atrium: Left atrial size was moderately dilated. Right Atrium: Right atrial size was mildly dilated. Pericardium: Trivial pericardial effusion is present. Mitral Valve: There is mild thickening of the mitral valve leaflet(s). Mild mitral annular calcification. Trivial mitral valve regurgitation. MV peak gradient, 13.5 mmHg. The mean mitral valve gradient is 3.0 mmHg. Tricuspid Valve: The tricuspid valve is normal in structure. Tricuspid valve regurgitation is mild. Aortic Valve: AV is thickened, calcified with restricted motion. Peak and mean pressures through the valve are 37 and 20 mm Hg resepctively AVA (VTI) is 1.2 cm2. Dimentionless index is 0.39 consistent with moderate aortic stenosis. Since echo from Jan 2020 there is no significant change. Aortic valve regurgitation is mild. Aortic regurgitation PHT measures 379 msec. Aortic valve mean gradient measures 19.5 mmHg. Aortic valve peak gradient measures 37.0 mmHg. Aortic valve area, by VTI measures 1.12 cm. Pulmonic Valve: The pulmonic valve was grossly normal. Pulmonic valve regurgitation is trivial. Aorta:  The aortic root and ascending aorta are structurally normal, with no  evidence of dilitation. Venous: The inferior vena cava is dilated in size with less than 50% respiratory variability, suggesting right atrial pressure of 15 mmHg. IAS/Shunts: No atrial level shunt detected by color flow Doppler.  LEFT VENTRICLE PLAX 2D LVIDd:         4.26 cm  Diastology LVIDs:         3.07 cm  LV e' medial:    8.19 cm/s LV PW:         1.48 cm  LV E/e' medial:  19.3 LV IVS:        0.98 cm  LV e' lateral:   9.89 cm/s LVOT diam:     1.90 cm  LV E/e' lateral: 16.0 LV SV:         86 LV SV Index:   46 LVOT Area:     2.84 cm  RIGHT VENTRICLE RV Basal diam:  5.21 cm RV Mid diam:    3.21 cm RV S prime:     9.81 cm/s TAPSE (M-mode): 2.8 cm LEFT ATRIUM              Index       RIGHT ATRIUM           Index LA diam:        5.40 cm  2.88 cm/m  RA Area:     20.70 cm LA Vol (A2C):   118.0 ml 63.00 ml/m RA Volume:   66.60 ml  35.56 ml/m LA Vol (A4C):   81.7 ml  43.62 ml/m LA Biplane Vol: 105.0 ml 56.06 ml/m  AORTIC VALVE AV Area (Vmax):    1.18 cm AV Area (Vmean):   1.18 cm AV Area (VTI):     1.12 cm AV Vmax:           304.00 cm/s AV Vmean:          202.500 cm/s AV VTI:            0.765 m AV Peak Grad:      37.0 mmHg AV Mean Grad:      19.5 mmHg LVOT Vmax:         127.00 cm/s LVOT Vmean:        84.400 cm/s LVOT VTI:          0.302 m LVOT/AV VTI ratio: 0.39 AI PHT:            379 msec  AORTA Ao Root diam: 3.30 cm Ao Asc diam:  3.80 cm MITRAL VALVE                TRICUSPID VALVE MV Area (PHT): 3.83 cm     TR Peak grad:   38.4 mmHg MV Area VTI:   1.97 cm     TR Vmax:        310.00 cm/s MV Peak grad:  13.5 mmHg MV Mean grad:  3.0 mmHg     SHUNTS MV Vmax:       1.84 m/s     Systemic VTI:  0.30 m MV Vmean:      71.7 cm/s    Systemic Diam: 1.90 cm MV Decel Time: 198 msec MV E velocity: 158.00 cm/s MV A velocity: 64.00 cm/s MV E/A ratio:  2.47 Dorris Carnes MD Electronically signed by Dorris Carnes MD Signature Date/Time: 07/22/2020/5:33:45 PM    Final       Scheduled Meds:  acetaZOLAMIDE  250 mg Oral  Daily    amLODipine  5 mg Oral Daily   apixaban  2.5 mg Oral BID   atorvastatin  80 mg Oral QPM   busPIRone  10 mg Oral BID   escitalopram  10 mg Oral Daily   hydrALAZINE  50 mg Oral TID   isosorbide dinitrate  30 mg Oral TID   levothyroxine  100 mcg Oral QAC breakfast   sodium bicarbonate  1,300 mg Oral BID   Continuous Infusions:   LOS: 1 day      Debbe Odea, MD [facility] Pager: [email or web address] 07/23/2020, 5:48 PM

## 2020-07-23 NOTE — Care Management Important Message (Signed)
Important Message  Patient Details  Name: Beth Padilla MRN: [id number] Date of Birth: May 13, 1940   Medicare Important Message Given:  Yes     [name] 07/23/2020, 3:45 PM

## 2020-07-23 NOTE — Progress Notes (Signed)
Nutrition Education Note  RD consulted for nutrition education regarding CHF.  Patient is an 80 yo female who presents with shortness of breath. History of DM 2, CKD-3b, anemia, depression and hyper lipidemia.  RD provided "Low Sodium Nutrition Therapy" handout from the Academy of Nutrition and Dietetics. Reviewed goal fluid intake 1.5 liter per MD.  Reviewed patient's dietary recall. Eats 3 meals daily. Oatmeal and toast breakfast , peanut butter crackers and fruit at lunch and greens, potatoes and chicken in the evening.   Provided examples on ways to decrease sodium intake in diet.   Discouraged intake of processed foods and use of salt shaker. Encouraged fresh fruits and vegetables as well as whole grain sources of carbohydrates to maximize fiber intake.   RD discussed why it is important for patient to adhere to diet recommendations, and emphasized the role of fluids, foods to avoid, and importance of weighing self daily. Patient weighs every other day.   Expect good compliance.  Body mass index is 26.16 kg/m. Pt meets criteria for overweight based on current BMI.  Current diet order is Heart Healthy 1.5 liter fluid restriction, patient is consuming approximately 50% of meals at this time.   Labs and medications reviewed.  BMP Latest Ref Rng & Units 07/23/2020 07/22/2020 07/21/2020  Glucose 70 - 99 mg/dL 166(H) 183(H) 192(H)  BUN 8 - 23 mg/dL 68(H) 70(H) 65(H)  Creatinine 0.44 - 1.00 mg/dL 2.26(H) 1.94(H) 1.82(H)  Sodium 135 - 145 mmol/L 139 138 138  Potassium 3.5 - 5.1 mmol/L 4.8 4.6 4.5  Chloride 98 - 111 mmol/L 107 107 108  CO2 22 - 32 mmol/L 24 22 24   Calcium 8.9 - 10.3 mg/dL 8.1(L) 8.3(L) 8.1(L)     No further nutrition interventions warranted at this time. RD contact information provided. If additional nutrition issues arise, please re-consult RD.   Colman Cater MS,RD,CSG,LDN Contact: Shea Evans

## 2020-07-24 ENCOUNTER — Inpatient Hospital Stay (HOSPITAL_COMMUNITY): Payer: Medicare Other

## 2020-07-24 LAB — BASIC METABOLIC PANEL
Anion gap: 7 (ref 5–15)
BUN: 67 mg/dL — ABNORMAL HIGH (ref 8–23)
CO2: 25 mmol/L (ref 22–32)
Calcium: 7.9 mg/dL — ABNORMAL LOW (ref 8.9–10.3)
Chloride: 106 mmol/L (ref 98–111)
Creatinine, Ser: 2.36 mg/dL — ABNORMAL HIGH (ref 0.44–1.00)
GFR, Estimated: 20 mL/min — ABNORMAL LOW (ref >60)
Glucose, Bld: 125 mg/dL — ABNORMAL HIGH (ref 70–99)
Potassium: 4.2 mmol/L (ref 3.5–5.1)
Sodium: 138 mmol/L (ref 135–145)

## 2020-07-24 MED ORDER — FUROSEMIDE 10 MG/ML IJ SOLN
80.0000 mg | Freq: Once | INTRAMUSCULAR | Status: AC
  Administered 2020-07-24: 80 mg via INTRAVENOUS
  Filled 2020-07-24: qty 8

## 2020-07-24 NOTE — Progress Notes (Signed)
PROGRESS NOTE    Beth Padilla   TIW:[id number]  DOB: 1940/03/01  DOA: 07/21/2020 PCP: Kendrick Ranch, MD   Brief Narrative:  Beth Padilla a 80 y.o. female with medical history significant of chronic blood loss anemia, paroxysmal atrial fibrillation, AAA, depression, type 2 diabetes, hyperlipidemia, hypertension, hypothyroidism, history of pneumonia, stage IIIb CKD who is coming to the emergency department due to progressively worsening dyspnea associated with bilateral lower extremity edema, orthopnea and whitish phlegm occasionally productive cough for the past 2 days.  No complaints of fevers or chills.  Seen in the ED on 06/14/2020 and found to have COVID-chest x-ray showed multifocal pneumonia.  She had had symptoms for about 3 weeks and was not admitted to the hospital.  She was placed on doxycycline and antitussives and discharged home.  She was seen in the ED on 6/3 for shortness of breath which was thought to be related to COVID.  Chest x-ray showed unchanged multifocal pneumonia likely secondary to COVID infection.  Was the ED on 06/23/2020 for swelling of her ankles.  She was noted to have a potassium of 5.3 and given Lokelma and discharged.  She was asked to follow-up with her primary care doctor.  Subjective: Still has swelling in legs.     Assessment & Plan: Principal Problem:   Acute on chronic diastolic congestive heart failure    HTN (hypertension), uncontrolled -7/7 Increased Lasix from 40 twice daily to 80 twice daily & -Resumed amlodipine, hydralazine, isosorbide dinitrate, acetazolamide   - 7/8 BP slightly low-  decreased hydralazine from 100-50, Lasix held as Cr rose -I have educated her about heart failure-she states she did not know she had heart failure and drinks a lot of water every day - I have discussed a fluid restriction-have also asked dietary to speak with her - 7/9> - She still has coarse crackles at bases- ankles still swollen and  weight not much improved - Today she tells me that her doctor told her she has COPD- she has been exposed to second hand smoke from her children for about 20 yrs   Active Problems:     - AKI on CKD stage IV - Creatinine ranges mostly from 1.8-2.2 with a GFR in the 20s --Creatinine has increased from 1.82-2.26  - recheck in AM   Anemia - Hemoglobin is typically 8-9- -Hemoglobin is about 7.2 today-check Hemoccult and anemia panel     Paroxysmal atrial fibrillation (HCC) -Continue apixaban-she is not on rate controlling medications     Depression-anxiety Continue BuSpar and Lexapro  Hypothyroidism - Continue Synthroid   Cirrhosis noted on prior imaging -Needs outpatient work-up    Time spent in minutes: 35 DVT prophylaxis: apixaban (ELIQUIS) tablet 2.5 mg Start: 07/22/20 2200 apixaban (ELIQUIS) tablet 2.5 mg   Code Status: Full code Family Communication:  Level of Care: Level of care: Telemetry Disposition Plan:  Status is: Inpatient  Remains inpatient appropriate because:Inpatient level of care appropriate due to severity of illness  Dispo: The patient is from: Home              Anticipated d/c is to: Home              Patient currently is not medically stable to d/c.   Difficult to place patient No      Consultants:  none Procedures:  None Antimicrobials:  Anti-infectives (From admission, onward)    Start     Dose/Rate Route Frequency Ordered Stop   07/21/20 2330  levofloxacin (LEVAQUIN) IVPB 500 mg        500 mg 100 mL/hr over 60 Minutes Intravenous  Once 07/21/20 2328 07/22/20 0047        Objective: Vitals:   07/23/20 2149 07/24/20 0546 07/24/20 0600 07/24/20 1347  BP: (!) 148/51 (!) 149/68  (!) 138/54  Pulse: 73 76  83  Resp: 18 16  18   Temp: 98.5 F (36.9 C) 98 F (36.7 C)  98.7 F (37.1 C)  TempSrc: Oral Oral  Oral  SpO2: 100% 100%  100%  Weight:   74.2 kg   Height:        Intake/Output Summary (Last 24 hours) at 07/24/2020 1756 Last  data filed at 07/24/2020 1124 Gross per 24 hour  Intake 240 ml  Output 1050 ml  Net -810 ml    Filed Weights   07/21/20 1921 07/23/20 1700 07/24/20 0600  Weight: 75.8 kg 74.8 kg 74.2 kg    Examination: General exam: Appears comfortable  HEENT: PERRLA, oral mucosa moist, no sclera icterus or thrush Respiratory system: coarse crackles at bases- Respiratory effort normal. Cardiovascular system: S1 & S2 heard, regular rate and rhythm Gastrointestinal system: Abdomen soft, non-tender, nondistended. Normal bowel sounds   Central nervous system: Alert and oriented. No focal neurological deficits. Extremities: No cyanosis, clubbing - 2 + pitting edema Skin: No rashes or ulcers Psychiatry:  Mood & affect appropriate.      Data Reviewed: I have personally reviewed following labs and imaging studies  CBC: Recent Labs  Lab 07/21/20 2258 07/22/20 0455  WBC 6.2 6.2  NEUTROABS 4.8  --   HGB 7.3* 7.2*  HCT 23.8* 23.3*  MCV 86.9 86.9  PLT 335 253    Basic Metabolic Panel: Recent Labs  Lab 07/21/20 2258 07/22/20 0455 07/23/20 0459 07/24/20 0405  NA 138 138 139 138  K 4.5 4.6 4.8 4.2  CL 108 107 107 106  CO2 24 22 24 25   GLUCOSE 192* 183* 166* 125*  BUN 65* 70* 68* 67*  CREATININE 1.82* 1.94* 2.26* 2.36*  CALCIUM 8.1* 8.3* 8.1* 7.9*    GFR: Estimated Creatinine Clearance: 19.7 mL/min (A) (by C-G formula based on SCr of 2.36 mg/dL (H)). Liver Function Tests: Recent Labs  Lab 07/21/20 2258  AST 25  ALT 20  ALKPHOS 51  BILITOT 0.1*  PROT 7.2  ALBUMIN 3.0*    No results for input(s): LIPASE, AMYLASE in the last 168 hours. No results for input(s): AMMONIA in the last 168 hours. Coagulation Profile: No results for input(s): INR, PROTIME in the last 168 hours. Cardiac Enzymes: No results for input(s): CKTOTAL, CKMB, CKMBINDEX, TROPONINI in the last 168 hours. BNP (last 3 results) No results for input(s): PROBNP in the last 8760 hours. HbA1C: No results for  input(s): HGBA1C in the last 72 hours. CBG: No results for input(s): GLUCAP in the last 168 hours. Lipid Profile: No results for input(s): CHOL, HDL, LDLCALC, TRIG, CHOLHDL, LDLDIRECT in the last 72 hours. Thyroid Function Tests: No results for input(s): TSH, T4TOTAL, FREET4, T3FREE, THYROIDAB in the last 72 hours. Anemia Panel: No results for input(s): VITAMINB12, FOLATE, FERRITIN, TIBC, IRON, RETICCTPCT in the last 72 hours. Urine analysis:    Component Value Date/Time   COLORURINE STRAW (A) 02/13/2018 [place] 02/13/2018 1705   LABSPEC 1.006 02/13/2018 1705   PHURINE 7.0 02/13/2018 1705   GLUCOSEU NEGATIVE 02/13/2018 1705   HGBUR NEGATIVE 02/13/2018 [place] 02/13/2018 1705   KETONESUR NEGATIVE  02/13/2018 1705   PROTEINUR 30 (A) 02/13/2018 1705   UROBILINOGEN 0.2 07/18/2014 2120   NITRITE NEGATIVE 02/13/2018 1705   LEUKOCYTESUR SMALL (A) 02/13/2018 1705   Sepsis Labs: @LABRCNTIP (procalcitonin:4,lacticidven:4) ) Recent Results (from the past 240 hour(s))  Culture, blood (Routine X 2) w Reflex to ID Panel     Status: None (Preliminary result)   Collection Time: 07/21/20 10:58 PM   Specimen: BLOOD  Result Value Ref Range Status   Specimen Description BLOOD RIGHT ANTECUBITAL  Final   Special Requests   Final    BOTTLES DRAWN AEROBIC AND ANAEROBIC Blood Culture adequate volume   Culture   Final    NO GROWTH 3 DAYS Performed at [facility], [address] [place], [place] [address]    Report Status PENDING  Incomplete  Culture, blood (Routine X 2) w Reflex to ID Panel     Status: None (Preliminary result)   Collection Time: 07/21/20 11:19 PM   Specimen: BLOOD RIGHT FOREARM  Result Value Ref Range Status   Specimen Description BLOOD RIGHT FOREARM  Final   Special Requests   Final    BOTTLES DRAWN AEROBIC AND ANAEROBIC Blood Culture adequate volume   Culture   Final    NO GROWTH 3 DAYS Performed at [facility], [address]  [place], [place] [address]    Report Status PENDING  Incomplete  SARS CORONAVIRUS 2 (TAT 6-24 HRS) Nasopharyngeal Nasopharyngeal Swab     Status: None   Collection Time: 07/22/20 12:15 AM   Specimen: Nasopharyngeal Swab  Result Value Ref Range Status   SARS Coronavirus 2 NEGATIVE NEGATIVE Final    Comment: (NOTE) SARS-CoV-2 target nucleic acids are NOT DETECTED.  The SARS-CoV-2 RNA is generally detectable in upper and lower respiratory specimens during the acute phase of infection. Negative results do not preclude SARS-CoV-2 infection, do not rule out co-infections with other pathogens, and should not be used as the sole basis for treatment or other patient management decisions. Negative results must be combined with clinical observations, patient history, and epidemiological information. The expected result is Negative.  Fact Sheet for Patients: SugarRoll.be  Fact Sheet for Healthcare Providers: https://www.woods-mathews.com/  This test is not yet approved or cleared by the [place] FDA and  has been authorized for detection and/or diagnosis of SARS-CoV-2 by FDA under an Emergency Use Authorization (EUA). This EUA will remain  in effect (meaning this test can be used) for the duration of the COVID-19 declaration under Se ction 564(b)(1) of the Act, 21 U.S.C. section 360bbb-3(b)(1), unless the authorization is terminated or revoked sooner.  Performed at [facility], [place] [address] [place], [place] [address]          Radiology Studies: DG Chest 2 View  Result Date: 07/24/2020 CLINICAL DATA:  Shortness of breath and coughing EXAM: CHEST - 2 VIEW COMPARISON:  07/21/2020 FINDINGS: Patchy bilateral airspace opacity, again favoring pneumonia. Stable cardiomegaly. No visible effusion or pneumothorax. IMPRESSION: Unchanged bilateral pulmonary infiltrates. Electronically Signed   By: Monte Fantasia M.D.   On: 07/24/2020 11:21       Scheduled Meds:  acetaZOLAMIDE  250 mg Oral Daily   amLODipine  5 mg Oral Daily   apixaban  2.5 mg Oral BID   atorvastatin  80 mg Oral QPM   busPIRone  10 mg Oral BID   escitalopram  10 mg Oral Daily   hydrALAZINE  50 mg Oral TID   isosorbide dinitrate  30 mg Oral TID  levothyroxine  100 mcg Oral QAC breakfast   sodium bicarbonate  1,300 mg Oral BID   Continuous Infusions:   LOS: 2 days      Debbe Odea, MD [facility] Pager: [email or web address] 07/24/2020, 5:56 PM

## 2020-07-25 LAB — CBC
HCT: 21.9 % — ABNORMAL LOW (ref 36.0–46.0)
HCT: 22.6 % — ABNORMAL LOW (ref 36.0–46.0)
HCT: 22.4 % — ABNORMAL LOW (ref 36.0–46.0)
Hemoglobin: 6.4 g/dL — CL (ref 12.0–15.0)
Hemoglobin: 6.6 g/dL — CL (ref 12.0–15.0)
Hemoglobin: 6.5 g/dL — CL (ref 12.0–15.0)
MCH: 26.2 pg (ref 26.0–34.0)
MCH: 26.4 pg (ref 26.0–34.0)
MCH: 26 pg (ref 26.0–34.0)
MCHC: 29.2 g/dL — ABNORMAL LOW (ref 30.0–36.0)
MCHC: 29.2 g/dL — ABNORMAL LOW (ref 30.0–36.0)
MCHC: 29 g/dL — ABNORMAL LOW (ref 30.0–36.0)
MCV: 89.8 fL (ref 80.0–100.0)
MCV: 90.4 fL (ref 80.0–100.0)
MCV: 89.6 fL (ref 80.0–100.0)
Platelets: 291 10*3/uL (ref 150–400)
Platelets: 304 10*3/uL (ref 150–400)
Platelets: 289 10*3/uL (ref 150–400)
RBC: 2.44 MIL/uL — ABNORMAL LOW (ref 3.87–5.11)
RBC: 2.5 MIL/uL — ABNORMAL LOW (ref 3.87–5.11)
RBC: 2.5 MIL/uL — ABNORMAL LOW (ref 3.87–5.11)
RDW: 22.4 % — ABNORMAL HIGH (ref 11.5–15.5)
RDW: 22.1 % — ABNORMAL HIGH (ref 11.5–15.5)
RDW: 22.3 % — ABNORMAL HIGH (ref 11.5–15.5)
WBC: 5.7 10*3/uL (ref 4.0–10.5)
WBC: 6 10*3/uL (ref 4.0–10.5)
WBC: 6.2 10*3/uL (ref 4.0–10.5)
nRBC: 0 % (ref 0.0–0.2)
nRBC: 0 % (ref 0.0–0.2)
nRBC: 0 % (ref 0.0–0.2)

## 2020-07-25 LAB — BASIC METABOLIC PANEL
Anion gap: 4 — ABNORMAL LOW (ref 5–15)
BUN: 61 mg/dL — ABNORMAL HIGH (ref 8–23)
CO2: 28 mmol/L (ref 22–32)
Calcium: 7.6 mg/dL — ABNORMAL LOW (ref 8.9–10.3)
Chloride: 105 mmol/L (ref 98–111)
Creatinine, Ser: 2.29 mg/dL — ABNORMAL HIGH (ref 0.44–1.00)
GFR, Estimated: 21 mL/min — ABNORMAL LOW (ref >60)
Glucose, Bld: 155 mg/dL — ABNORMAL HIGH (ref 70–99)
Potassium: 4.5 mmol/L (ref 3.5–5.1)
Sodium: 137 mmol/L (ref 135–145)

## 2020-07-25 LAB — RETICULOCYTES
Immature Retic Fract: 25 % — ABNORMAL HIGH (ref 2.3–15.9)
RBC.: 2.54 MIL/uL — ABNORMAL LOW (ref 3.87–5.11)
Retic Count, Absolute: 54.6 10*3/uL (ref 19.0–186.0)
Retic Ct Pct: 2.2 % (ref 0.4–3.1)

## 2020-07-25 LAB — IRON AND TIBC
Iron: 12 ug/dL — ABNORMAL LOW (ref 28–170)
Saturation Ratios: 4 % — ABNORMAL LOW (ref 10.4–31.8)
TIBC: 305 ug/dL (ref 250–450)
UIBC: 293 ug/dL

## 2020-07-25 LAB — FOLATE: Folate: 16.2 ng/mL (ref >5.9)

## 2020-07-25 LAB — HEMOGLOBIN AND HEMATOCRIT, BLOOD
HCT: 26 % — ABNORMAL LOW (ref 36.0–46.0)
Hemoglobin: 7.9 g/dL — ABNORMAL LOW (ref 12.0–15.0)

## 2020-07-25 LAB — FERRITIN: Ferritin: 12 ng/mL (ref 11–307)

## 2020-07-25 LAB — VITAMIN B12: Vitamin B-12: 1034 pg/mL — ABNORMAL HIGH (ref 180–914)

## 2020-07-25 LAB — PREPARE RBC (CROSSMATCH)

## 2020-07-25 MED ORDER — SODIUM CHLORIDE 0.9 % IV SOLN
250.0000 mg | Freq: Every day | INTRAVENOUS | Status: AC
  Administered 2020-07-25 – 2020-07-26 (×2): 250 mg via INTRAVENOUS
  Filled 2020-07-25 (×2): qty 20

## 2020-07-25 MED ORDER — SODIUM CHLORIDE 0.9% IV SOLUTION
Freq: Once | INTRAVENOUS | Status: AC
  Administered 2020-07-25: 270 mL via INTRAVENOUS

## 2020-07-25 MED ORDER — FUROSEMIDE 40 MG PO TABS
40.0000 mg | ORAL_TABLET | Freq: Every day | ORAL | Status: DC
  Administered 2020-07-25 – 2020-07-26 (×2): 40 mg via ORAL
  Filled 2020-07-25 (×2): qty 1

## 2020-07-25 NOTE — Progress Notes (Signed)
Pt's IV found to be infiltrated after IV iron.  Per patient IV was "puffy" after blood given and patient told day shift RN.  Per Patient, dayshift RN put heat pack on IV and then gave IV iron through same IV site.  Pharmacy at [facility] notified.  No antidote available, IV site removed and L arm elevated.  Pt denies any pain in site.  Will notify provider on call.

## 2020-07-25 NOTE — Progress Notes (Signed)
Patient ambulated to door without problems, c/o right foot pain which she said was chronic.

## 2020-07-25 NOTE — Progress Notes (Signed)
Hb 6.4 today. Last BM documented in flow sheets was on 7/8 and non bloody. I would like to recheck the Hb stat. I have called the RN, Jeannetta Nap, to inform her.

## 2020-07-25 NOTE — Plan of Care (Signed)
  Problem: Education: Goal: Knowledge of General Education information will improve Description Including pain rating scale, medication(s)/side effects and non-pharmacologic comfort measures Outcome: Progressing   Problem: Health Behavior/Discharge Planning: Goal: Ability to manage health-related needs will improve Outcome: Progressing   

## 2020-07-25 NOTE — Consult Note (Signed)
[facility] Renal Consultation Note  Requesting MD: Debbe Odea, MD Indication for Consultation:  CKD, diuresis  Chief complaint: shortness of breath  HPI:  Beth Padilla is a 80 y.o. female with a history of paroxysmal atrial fibrillation, HTN, diabetes mellitus, chronic diastolic CHF, CKD stage IV (BL Cr near 2) who presented to the hospital with shortness of breath.  Note that at the end of May through early June she was diagnosed with COVID pneumonia.  Nephrology is consulted for assistance with management of diuretics.  She has been on lasix 40 mg daily and is also on acetazolamide which she states is for her eyes.  She has been on the 40 mg daily dose of lasix for a few months - she reports was increased from 20 mg daily to 40 mg daily for swelling.  She has had anemia which has worsened this hospitalization.  She follows with an nephrology practice as below; baseline Cr 2-2.4 per their note.  She feels better now and reports that her breathing is doing good now on room air.  She states she never quite got back to baseline after covid and it's been one thing after another since.   [facility] [place] [address]   [phone]   Narda Bonds, DNP-FNP      Creatinine, Ser  Date/Time Value Ref Range Status  07/25/2020 04:12 AM 2.29 (H) 0.44 - 1.00 mg/dL Final  07/24/2020 04:05 AM 2.36 (H) 0.44 - 1.00 mg/dL Final  07/23/2020 04:59 AM 2.26 (H) 0.44 - 1.00 mg/dL Final  07/22/2020 04:55 AM 1.94 (H) 0.44 - 1.00 mg/dL Final  07/21/2020 10:58 PM 1.82 (H) 0.44 - 1.00 mg/dL Final  06/23/2020 11:52 AM 2.31 (H) 0.44 - 1.00 mg/dL Final  06/14/2020 12:20 PM 2.28 (H) 0.44 - 1.00 mg/dL Final  05/04/2019 07:01 AM 2.25 (H) 0.44 - 1.00 mg/dL Final  05/03/2019 03:55 AM 1.87 (H) 0.44 - 1.00 mg/dL Final  02/15/2018 06:29 AM 2.21 (H) 0.44 - 1.00 mg/dL Final  02/14/2018 03:39 AM 2.10 (H) 0.44 - 1.00 mg/dL Final  02/13/2018 11:42 AM 2.19  (H) 0.44 - 1.00 mg/dL Final  07/22/2014 05:33 AM 1.41 (H) 0.44 - 1.00 mg/dL Final  07/20/2014 04:44 AM 1.67 (H) 0.44 - 1.00 mg/dL Final  07/19/2014 12:21 AM 1.81 (H) 0.44 - 1.00 mg/dL Final  07/18/2014 09:00 PM 1.89 (H) 0.44 - 1.00 mg/dL Final  09/17/2013 06:17 AM 2.29 (H) 0.50 - 1.10 mg/dL Final  09/16/2013 04:24 AM 2.59 (H) 0.50 - 1.10 mg/dL Final  09/15/2013 04:43 AM 2.56 (H) 0.50 - 1.10 mg/dL Final  09/14/2013 12:18 PM 2.60 (H) 0.50 - 1.10 mg/dL Final  09/20/2012 01:52 PM 1.79 (H) 0.50 - 1.10 mg/dL Final  12/18/2010 05:00 AM 1.10 0.50 - 1.10 mg/dL Final  12/17/2010 06:40 AM 1.19 (H) 0.50 - 1.10 mg/dL Final  12/16/2010 05:19 AM 1.16 (H) 0.50 - 1.10 mg/dL Final  12/15/2010 11:33 AM 1.23 (H) 0.50 - 1.10 mg/dL Final     PMHx:   Past Medical History:  Diagnosis Date   Anemia    Atrial fibrillation ([place])    Depression    Diabetes mellitus    Hyperlipidemia 12/15/2010   Hypertension    Hypothyroidism 12/15/2010   Pneumonia    Renal disorder     Past Surgical History:  Procedure Laterality Date   ABDOMINAL HYSTERECTOMY     EP IMPLANTABLE DEVICE N/A 07/21/2014   Procedure: Loop Recorder Insertion;  Surgeon: [name], MD;  Location: [facility];  Service: Cardiovascular;  Laterality: N/A;   implantable loop recorder removal  10/14/2018   MDT Reveal LINQ removed in office by Dr [name]   TEE WITHOUT CARDIOVERSION N/A 07/21/2014   Procedure: TRANSESOPHAGEAL ECHOCARDIOGRAM (TEE);  Surgeon: [name], MD;  Location: [facility] ENDOSCOPY;  Service: Cardiovascular;  Laterality: N/A;    Family Hx:  Family History  Problem Relation Age of Onset   Stroke Mother    Cancer Sister     Social History:  reports that she has never smoked. She has never used smokeless tobacco. She reports that she does not drink alcohol and does not use drugs.  Allergies:  Allergies  Allergen Reactions   Ampicillin Rash    Medications: Prior to Admission medications   Medication Sig Start Date  End Date Taking? Authorizing Provider  acetaminophen (TYLENOL) 650 MG CR tablet Take 650 mg by mouth every 8 (eight) hours as needed for pain.   Yes [provider]  acetaZOLAMIDE (DIAMOX) 250 MG tablet Take 1 tablet by mouth daily. 11/30/17  Yes [provider]  amLODipine (NORVASC) 10 MG tablet Take 5 mg by mouth daily. 06/18/18  Yes [provider]  apixaban (ELIQUIS) 2.5 MG TABS tablet Take 2.5 mg by mouth 2 (two) times daily.   Yes [provider]  atorvastatin (LIPITOR) 80 MG tablet Take 1 tablet (80 mg total) by mouth every evening. 07/22/14  Yes [name], MD  busPIRone (BUSPAR) 10 MG tablet Take 10 mg by mouth 2 (two) times daily. 07/14/20  Yes [provider]  [employer] DICLOFENAC SODIUM 1 % GEL Apply 2 g topically 4 (four) times daily as needed (pain). 07/08/20  Yes [provider]  escitalopram (LEXAPRO) 10 MG tablet Take 10 mg by mouth daily.   Yes [provider]  ferrous sulfate 325 (65 FE) MG tablet Take 1 tablet by mouth 2 (two) times daily. 11/09/17  Yes [provider]  furosemide (LASIX) 20 MG tablet Take 1 tablet (20 mg total) by mouth daily. Patient taking differently: Take 40 mg by mouth daily. 02/15/18  Yes Tat, Shanon Brow, MD  gabapentin (NEURONTIN) 300 MG capsule Take 1 capsule by mouth daily as needed (nerve pain). 01/03/18  Yes [provider]  hydrALAZINE (APRESOLINE) 100 MG tablet Take 100 mg by mouth 3 (three) times daily.   Yes [provider]  isosorbide dinitrate (ISORDIL) 30 MG tablet Take 30 mg by mouth 3 (three) times daily.  09/18/15  Yes [provider]  levothyroxine (SYNTHROID, LEVOTHROID) 100 MCG tablet Take 100 mcg by mouth daily before breakfast.   Yes [provider]  omeprazole (PRILOSEC) 20 MG capsule Take 20 mg by mouth daily as needed (reflux).   Yes [provider]  promethazine-dextromethorphan (PROMETHAZINE-DM) 6.25-15 MG/5ML syrup Take 1.3 mLs by  mouth 4 (four) times daily as needed for cough. 06/14/20  Yes Fondaw, Wylder S, PA  sodium bicarbonate 650 MG tablet Take 1,300 mg by mouth 2 (two) times daily.   Yes [provider]  traMADol (ULTRAM) 50 MG tablet Take 50 mg by mouth daily as needed for moderate pain. 05/17/20  Yes [provider]  Vitamins A & D (VITAMIN A & D) ointment Apply 1 application topically as needed for dry skin. 11/09/17  Yes [provider]  benzonatate (TESSALON) 100 MG capsule Take 1 capsule (100 mg total) by mouth every 8 (eight) hours. Patient not taking: No sig reported  06/14/20   Tedd Sias, PA    I have reviewed the patient's current and reported prior to admission medications.  Labs:  BMP Latest Ref Rng & Units 07/25/2020 07/24/2020 07/23/2020  Glucose 70 - 99 mg/dL 155(H) 125(H) 166(H)  BUN 8 - 23 mg/dL 61(H) 67(H) 68(H)  Creatinine 0.44 - 1.00 mg/dL 2.29(H) 2.36(H) 2.26(H)  Sodium 135 - 145 mmol/L 137 138 139  Potassium 3.5 - 5.1 mmol/L 4.5 4.2 4.8  Chloride 98 - 111 mmol/L 105 106 107  CO2 22 - 32 mmol/L 28 25 24   Calcium 8.9 - 10.3 mg/dL 7.6(L) 7.9(L) 8.1(L)    ROS:  Pertinent items noted in HPI and remainder of comprehensive ROS otherwise negative.  Physical Exam: Vitals:   07/25/20 1222 07/25/20 1456  BP: (!) 125/54 (!) 136/54  Pulse: 77 76  Resp: 18 17  Temp: 98.5 F (36.9 C) 98.6 F (37 C)  SpO2: 100% 100%     General: elderly female in bed in NAD; appears younger than stated age 57: NCAT Eyes: EOMI sclera anicteric Neck: supple trachea midline Heart: S1S2 no rub Lungs: clear to auscultation bilaterally; normal work of breathing at rest on room air  Abdomen: soft/NT/ND Extremities: 1+ edema lower extremities Skin: no rash on extremities exposed Neuro: alert and oriented x 3 provides hx and follows commands Psych normal mood and affect   Assessment/Plan:  # CKD stage IV - Appears near recent baseline per available data  - She is followed by  [facility] in [place], [place]  # Chronic diastolic CHF  - Note home lasix is 40 mg daily.  Sats 100% on room air.  - Will resume home lasix dose  # Anemia iron deficiency - May also be in part secondary to CKD  - she does follow with hem/onc - per April nephrology charting epo on hold  - ordered IV iron x 2 doses - she is getting PRBC's today - team is monitoring stools/GI losses - Check SPEP, UPEP, free light chains as well   # HTN  - Acceptable control on current regimen  # A fib  - noted - on anticoagulation per primary team; watch for signs of GI bleed with anemia   Claudia Desanctis 07/25/2020, 4:21 PM

## 2020-07-25 NOTE — Progress Notes (Addendum)
PROGRESS NOTE    Beth Padilla   DDU:[id number]  DOB: 06/15/1940  DOA: 07/21/2020 PCP: Kendrick Ranch, MD   Brief Narrative:  Beth Kida Florenceis a 80 y.o. female with medical history significant of chronic blood loss anemia, paroxysmal atrial fibrillation, AAA, depression, type 2 diabetes, hyperlipidemia, hypertension, hypothyroidism, history of pneumonia, stage IIIb CKD who is coming to the emergency department due to progressively worsening dyspnea associated with bilateral lower extremity edema, orthopnea and whitish phlegm occasionally productive cough for the past 2 days.  No complaints of fevers or chills.  Seen in the ED on 06/14/2020 and found to have COVID-chest x-ray showed multifocal pneumonia.  She had had symptoms for about 3 weeks and was not admitted to the hospital.  She was placed on doxycycline and antitussives and discharged home.  She was seen in the ED on 6/3 for shortness of breath which was thought to be related to COVID.  Chest x-ray showed unchanged multifocal pneumonia likely secondary to COVID infection.  Was the ED on 06/23/2020 for swelling of her ankles.  She was noted to have a potassium of 5.3 and given Lokelma and discharged.  She was asked to follow-up with her primary care doctor.  Subjective: Hb 6.5 today- no blood noted in stools per patient.     Assessment & Plan: Principal Problem:   Acute on chronic diastolic congestive heart failure    HTN (hypertension), uncontrolled -7/7 Increased Lasix from 40 twice daily to 80 twice daily & -Resumed amlodipine, hydralazine, isosorbide dinitrate, acetazolamide   - 7/8 BP slightly low-  decreased hydralazine from 100-50, Lasix held as Cr rose -I have educated her about heart failure-she states she did not know she had heart failure and drinks a lot of water every day - I have discussed a fluid restriction-have also asked dietary to speak with her - 7/9> - She still has coarse crackles at bases-  ankles still swollen and weight not much improved -  she tells me that her doctor told her she has COPD- she has been exposed to second hand smoke from her children for about 20 yrs - 7/10- home lasix resumed by nephrology    Active Problems:      CKD stage IV - Creatinine ranges mostly from 1.8-2.2 with a GFR in the 20s   Anemia - Hemoglobin is typically 8-9- -Hemoglobin dropping 7.2 > 6.4 today- redrawn> 6.5 - given 1 u PRBC-  - no bleeding noted thus far-check Hemoccult - anemia panel reveals Iron saturation of 4- Needs IV Iron which I see nephro has ordered already today - repeat Hb tomorrow AM to see if it is dropping further     Paroxysmal atrial fibrillation (HCC) -Continue apixaban-she is not on rate controlling medications     Depression-anxiety Continue BuSpar and Lexapro  Hypothyroidism - Continue Synthroid   Cirrhosis noted on prior imaging -Needs outpatient work-up    Time spent in minutes: 35 DVT prophylaxis: apixaban (ELIQUIS) tablet 2.5 mg Start: 07/22/20 2200 apixaban (ELIQUIS) tablet 2.5 mg   Code Status: Full code Family Communication:  Level of Care: Level of care: Telemetry Disposition Plan:  Status is: Inpatient  Remains inpatient appropriate because:Inpatient level of care appropriate due to severity of illness  Dispo: The patient is from: Home              Anticipated d/c is to: Home              Patient currently is not medically  stable to d/c.   Difficult to place patient No      Consultants:  none Procedures:  None Antimicrobials:  Anti-infectives (From admission, onward)    Start     Dose/Rate Route Frequency Ordered Stop   07/21/20 2330  levofloxacin (LEVAQUIN) IVPB 500 mg        500 mg 100 mL/hr over 60 Minutes Intravenous  Once 07/21/20 2328 07/22/20 0047        Objective: Vitals:   07/25/20 0358 07/25/20 1207 07/25/20 1222 07/25/20 1456  BP: (!) 159/55 (!) 133/49 (!) 125/54 (!) 136/54  Pulse: 76 76 77 76  Resp:  18 18 18 17   Temp: 98.6 F (37 C) 98.4 F (36.9 C) 98.5 F (36.9 C) 98.6 F (37 C)  TempSrc: Oral Oral Oral Oral  SpO2: 100% 100% 100% 100%  Weight:      Height:        Intake/Output Summary (Last 24 hours) at 07/25/2020 1747 Last data filed at 07/25/2020 1453 Gross per 24 hour  Intake 1062 ml  Output 600 ml  Net 462 ml    Filed Weights   07/21/20 1921 07/23/20 1700 07/24/20 0600  Weight: 75.8 kg 74.8 kg 74.2 kg    Examination: General exam: Appears comfortable  HEENT: PERRLA, oral mucosa moist, no sclera icterus or thrush Respiratory system: coarse crackles at bases- Respiratory effort normal. Cardiovascular system: S1 & S2 heard, regular rate and rhythm Gastrointestinal system: Abdomen soft, non-tender, nondistended. Normal bowel sounds   Central nervous system: Alert and oriented. No focal neurological deficits. Extremities: No cyanosis, clubbing - 2 + pitting edema Skin: No rashes or ulcers Psychiatry:  Mood & affect appropriate.      Data Reviewed: I have personally reviewed following labs and imaging studies  CBC: Recent Labs  Lab 07/21/20 2258 07/22/20 0455 07/25/20 0412 07/25/20 0628 07/25/20 0839 07/25/20 1655  WBC 6.2 6.2 5.7 6.0 6.2  --   NEUTROABS 4.8  --   --   --   --   --   HGB 7.3* 7.2* 6.4* 6.6* 6.5* 7.9*  HCT 23.8* 23.3* 21.9* 22.6* 22.4* 26.0*  MCV 86.9 86.9 89.8 90.4 89.6  --   PLT 335 309 291 304 289  --     Basic Metabolic Panel: Recent Labs  Lab 07/21/20 2258 07/22/20 0455 07/23/20 0459 07/24/20 0405 07/25/20 0412  NA 138 138 139 138 137  K 4.5 4.6 4.8 4.2 4.5  CL 108 107 107 106 105  CO2 24 22 24 25 28   GLUCOSE 192* 183* 166* 125* 155*  BUN 65* 70* 68* 67* 61*  CREATININE 1.82* 1.94* 2.26* 2.36* 2.29*  CALCIUM 8.1* 8.3* 8.1* 7.9* 7.6*    GFR: Estimated Creatinine Clearance: 20.3 mL/min (A) (by C-G formula based on SCr of 2.29 mg/dL (H)). Liver Function Tests: Recent Labs  Lab 07/21/20 2258  AST 25  ALT 20   ALKPHOS 51  BILITOT 0.1*  PROT 7.2  ALBUMIN 3.0*    No results for input(s): LIPASE, AMYLASE in the last 168 hours. No results for input(s): AMMONIA in the last 168 hours. Coagulation Profile: No results for input(s): INR, PROTIME in the last 168 hours. Cardiac Enzymes: No results for input(s): CKTOTAL, CKMB, CKMBINDEX, TROPONINI in the last 168 hours. BNP (last 3 results) No results for input(s): PROBNP in the last 8760 hours. HbA1C: No results for input(s): HGBA1C in the last 72 hours. CBG: No results for input(s): GLUCAP in the last 168 hours. Lipid Profile:  No results for input(s): CHOL, HDL, LDLCALC, TRIG, CHOLHDL, LDLDIRECT in the last 72 hours. Thyroid Function Tests: No results for input(s): TSH, T4TOTAL, FREET4, T3FREE, THYROIDAB in the last 72 hours. Anemia Panel: Recent Labs    07/25/20 0628 07/25/20 0829  VITAMINB12 1,034*  --   FOLATE  --  16.2  FERRITIN 12  --   TIBC 305  --   IRON 12*  --   RETICCTPCT 2.2  --    Urine analysis:    Component Value Date/Time   COLORURINE STRAW (A) 02/13/2018 1705   APPEARANCEUR CLEAR 02/13/2018 1705   LABSPEC 1.006 02/13/2018 1705   [place] 7.0 02/13/2018 1705   GLUCOSEU NEGATIVE 02/13/2018 1705   HGBUR NEGATIVE 02/13/2018 [place] 02/13/2018 [place] 02/13/2018 1705   PROTEINUR 30 (A) 02/13/2018 1705   UROBILINOGEN 0.2 07/18/2014 2120   NITRITE NEGATIVE 02/13/2018 1705   LEUKOCYTESUR SMALL (A) 02/13/2018 1705   Sepsis Labs: @LABRCNTIP (procalcitonin:4,lacticidven:4) ) Recent Results (from the past 240 hour(s))  Culture, blood (Routine X 2) w Reflex to ID Panel     Status: None (Preliminary result)   Collection Time: 07/21/20 10:58 PM   Specimen: BLOOD  Result Value Ref Range Status   Specimen Description BLOOD RIGHT ANTECUBITAL  Final   Special Requests   Final    BOTTLES DRAWN AEROBIC AND ANAEROBIC Blood Culture adequate volume   Culture   Final    NO GROWTH 4  DAYS Performed at [facility], [address], [place], [place] [address]    Report Status PENDING  Incomplete  Culture, blood (Routine X 2) w Reflex to ID Panel     Status: None (Preliminary result)   Collection Time: 07/21/20 11:19 PM   Specimen: BLOOD RIGHT FOREARM  Result Value Ref Range Status   Specimen Description BLOOD RIGHT FOREARM  Final   Special Requests   Final    BOTTLES DRAWN AEROBIC AND ANAEROBIC Blood Culture adequate volume   Culture   Final    NO GROWTH 4 DAYS Performed at [facility], [address], [place], [place] [address]    Report Status PENDING  Incomplete  SARS CORONAVIRUS 2 (TAT 6-24 HRS) Nasopharyngeal Nasopharyngeal Swab     Status: None   Collection Time: 07/22/20 12:15 AM   Specimen: Nasopharyngeal Swab  Result Value Ref Range Status   SARS Coronavirus 2 NEGATIVE NEGATIVE Final    Comment: (NOTE) SARS-CoV-2 target nucleic acids are NOT DETECTED.  The SARS-CoV-2 RNA is generally detectable in upper and lower respiratory specimens during the acute phase of infection. Negative results do not preclude SARS-CoV-2 infection, do not rule out co-infections with other pathogens, and should not be used as the sole basis for treatment or other patient management decisions. Negative results must be combined with clinical observations, patient history, and epidemiological information. The expected result is Negative.  Fact Sheet for Patients: SugarRoll.be  Fact Sheet for Healthcare Providers: https://www.woods-mathews.com/  This test is not yet approved or cleared by the [place] FDA and  has been authorized for detection and/or diagnosis of SARS-CoV-2 by FDA under an Emergency Use Authorization (EUA). This EUA will remain  in effect (meaning this test can be used) for the duration of the COVID-19 declaration under Se ction 564(b)(1) of the Act, 21 U.S.C. section 360bbb-3(b)(1), unless the authorization is  terminated or revoked sooner.  Performed at [facility], [place] [address], [place], [place] [address]  Radiology Studies: DG Chest 2 View  Result Date: 07/24/2020 CLINICAL DATA:  Shortness of breath and coughing EXAM: CHEST - 2 VIEW COMPARISON:  07/21/2020 FINDINGS: Patchy bilateral airspace opacity, again favoring pneumonia. Stable cardiomegaly. No visible effusion or pneumothorax. IMPRESSION: Unchanged bilateral pulmonary infiltrates. Electronically Signed   By: Monte Fantasia M.D.   On: 07/24/2020 11:21      Scheduled Meds:  sodium chloride   Intravenous Once   acetaZOLAMIDE  250 mg Oral Daily   amLODipine  5 mg Oral Daily   apixaban  2.5 mg Oral BID   atorvastatin  80 mg Oral QPM   busPIRone  10 mg Oral BID   escitalopram  10 mg Oral Daily   furosemide  40 mg Oral Daily   hydrALAZINE  50 mg Oral TID   isosorbide dinitrate  30 mg Oral TID   levothyroxine  100 mcg Oral QAC breakfast   sodium bicarbonate  1,300 mg Oral BID   Continuous Infusions:  ferric gluconate (FERRLECIT) IVPB       LOS: 3 days      Debbe Odea, MD [facility] Pager: [email or web address] 07/25/2020, 5:47 PM

## 2020-07-25 NOTE — Progress Notes (Signed)
[facility] night shift telemetry coverage note.  The nursing staff reported the the IV was infiltrated after the patient received the IV iron.  The IV site was swollen.  According to what the patient told the staff was during dayshift she had a heat pack on the IV site and then the iron was infused.  See the previous nursing staff's note for further information.  Please see the pictures below.      Tennis Must, MD.

## 2020-07-25 NOTE — Progress Notes (Signed)
[facility] night shift.  The nursing staff reported the patient's hemoglobin level this morning was 6.4 g/dL.  A single PRBC unit transfusion was ordered.  Tennis Must, MD.

## 2020-07-26 DIAGNOSIS — I480 Paroxysmal atrial fibrillation: Secondary | ICD-10-CM

## 2020-07-26 DIAGNOSIS — E039 Hypothyroidism, unspecified: Secondary | ICD-10-CM

## 2020-07-26 DIAGNOSIS — E1165 Type 2 diabetes mellitus with hyperglycemia: Secondary | ICD-10-CM

## 2020-07-26 DIAGNOSIS — I10 Essential (primary) hypertension: Secondary | ICD-10-CM

## 2020-07-26 DIAGNOSIS — N184 Chronic kidney disease, stage 4 (severe): Secondary | ICD-10-CM

## 2020-07-26 DIAGNOSIS — E782 Mixed hyperlipidemia: Secondary | ICD-10-CM

## 2020-07-26 DIAGNOSIS — F32A Depression, unspecified: Secondary | ICD-10-CM

## 2020-07-26 LAB — CBC
HCT: 24.2 % — ABNORMAL LOW (ref 36.0–46.0)
Hemoglobin: 7.5 g/dL — ABNORMAL LOW (ref 12.0–15.0)
MCH: 27.6 pg (ref 26.0–34.0)
MCHC: 31 g/dL (ref 30.0–36.0)
MCV: 89 fL (ref 80.0–100.0)
Platelets: 275 10*3/uL (ref 150–400)
RBC: 2.72 MIL/uL — ABNORMAL LOW (ref 3.87–5.11)
RDW: 19.9 % — ABNORMAL HIGH (ref 11.5–15.5)
WBC: 6.9 10*3/uL (ref 4.0–10.5)
nRBC: 0 % (ref 0.0–0.2)

## 2020-07-26 LAB — BPAM RBC
Blood Product Expiration Date: 202207222359
ISSUE DATE / TIME: 202207101158
Unit Type and Rh: 600

## 2020-07-26 LAB — KAPPA/LAMBDA LIGHT CHAINS
Kappa free light chain: 119.7 mg/L — ABNORMAL HIGH (ref 3.3–19.4)
Kappa, lambda light chain ratio: 1.46 (ref 0.26–1.65)
Lambda free light chains: 81.9 mg/L — ABNORMAL HIGH (ref 5.7–26.3)

## 2020-07-26 LAB — PROTEIN ELECTROPHORESIS, SERUM
A/G Ratio: 0.9 (ref 0.7–1.7)
Albumin ELP: 2.8 g/dL — ABNORMAL LOW (ref 2.9–4.4)
Alpha-1-Globulin: 0.3 g/dL (ref 0.0–0.4)
Alpha-2-Globulin: 0.7 g/dL (ref 0.4–1.0)
Beta Globulin: 1 g/dL (ref 0.7–1.3)
Gamma Globulin: 1 g/dL (ref 0.4–1.8)
Globulin, Total: 3 g/dL (ref 2.2–3.9)
M-Spike, %: 0.4 g/dL — ABNORMAL HIGH
Total Protein ELP: 5.8 g/dL — ABNORMAL LOW (ref 6.0–8.5)

## 2020-07-26 LAB — TYPE AND SCREEN
ABO/RH(D): A POS
Antibody Screen: NEGATIVE
Unit division: 0

## 2020-07-26 LAB — BASIC METABOLIC PANEL
Anion gap: 5 (ref 5–15)
BUN: 60 mg/dL — ABNORMAL HIGH (ref 8–23)
CO2: 24 mmol/L (ref 22–32)
Calcium: 7.4 mg/dL — ABNORMAL LOW (ref 8.9–10.3)
Chloride: 105 mmol/L (ref 98–111)
Creatinine, Ser: 2.19 mg/dL — ABNORMAL HIGH (ref 0.44–1.00)
GFR, Estimated: 22 mL/min — ABNORMAL LOW (ref >60)
Glucose, Bld: 152 mg/dL — ABNORMAL HIGH (ref 70–99)
Potassium: 3.8 mmol/L (ref 3.5–5.1)
Sodium: 134 mmol/L — ABNORMAL LOW (ref 135–145)

## 2020-07-26 LAB — CULTURE, BLOOD (ROUTINE X 2)
Culture: NO GROWTH
Culture: NO GROWTH
Special Requests: ADEQUATE
Special Requests: ADEQUATE

## 2020-07-26 MED ORDER — HYDRALAZINE HCL 50 MG PO TABS: 50.0000 mg | ORAL_TABLET | Freq: Three times a day (TID) | ORAL | 1 refills | Status: DC

## 2020-07-26 MED ORDER — FUROSEMIDE 20 MG PO TABS: 40.0000 mg | ORAL_TABLET | Freq: Every day | ORAL | Status: DC

## 2020-07-26 NOTE — Plan of Care (Signed)

## 2020-07-26 NOTE — Discharge Summary (Signed)
Physician Discharge Summary  Beth Padilla GQQ:[id number] DOB: [birth year] DOA: 07/21/2020  PCP: Kendrick Ranch, MD  Admit date: 07/21/2020 Discharge date: 07/26/2020  Time spent: 35 minutes  Recommendations for Outpatient Follow-up:  Reassess blood pressure and further adjust antihypertensive regimen as needed Close monitoring of patient volume status and further adjustment to diuretic regimen as required. Continue close monitoring of patient's CBGs Recommending outpatient follow-up with gastroenterology service for further evaluation and management of abnormal liver contour suggesting cirrhosis.   Discharge Diagnoses:  Principal Problem:   Acute on chronic diastolic congestive heart failure (HCC) Active Problems:   HTN (hypertension)   Normocytic anemia   Hypothyroidism   Hyperlipidemia   Acute renal failure superimposed on stage 4 chronic kidney disease (HCC)   CKD (chronic kidney disease), stage III (HCC)   Paroxysmal atrial fibrillation (HCC)   Uncontrolled type 2 diabetes mellitus with hyperglycemia (HCC)   Depression   Acute diastolic CHF (congestive heart failure) ([place])   Discharge Condition: Stable and improved.  Discharged home with instruction to follow-up with PCP in the next 10 days.  Also outpatient follow-up with nephrology in 1 week.  CODE STATUS: Full code  Diet recommendation: Heart healthy modified carbohydrate diet.  Filed Weights   07/23/20 1700 07/24/20 0600 07/26/20 0454  Weight: 74.8 kg 74.2 kg 74.3 kg    History of present illness:  As per H&P written by Dr. Olevia Bowens on 07/22/2020 Beth Padilla is a 80 y.o. female with medical history significant of chronic blood loss anemia, paroxysmal atrial fibrillation, depression, type 2 diabetes, hyperlipidemia, hypertension, hypothyroidism, history of pneumonia, stage IIIb CKD who is coming to the emergency department due to progressively worsening dyspnea associated with bilateral lower extremity  edema, orthopnea and whitish phlegm occasionally productive cough since 2 days ago.  She denies chest pain, dizziness or palpitations.  No fever, rhinorrhea or sore throat, but states he has had some chills.  Denied wheezing or hemoptysis.  No abdominal pain, nausea, emesis, diarrhea, constipation, melena or hematochezia.  No dysuria, frequency or hematuria.  No polyuria, polydipsia, polyphagia or blurred vision.   ED Course: Initial vital signs were temperature 99.2 F, pulse 75, respiration 20, BP 181/81 mmHg and O2 sat 97% on room air.  The patient received 40 mg of furosemide, 20 mg of hydralazine IVP and 500 mg of Levaquin IVPB.   Lab work: CBC showed a white count of 6.2, hemoglobin 7.3 g/dL (was 10.0 g/dL 26 days ago) and platelets 335.  BNP was 1321.0 pg/mL.  CMP showed normal electrolytes when calcium was corrected to an albumin of 3.0 g/dL.  The rest of the hepatic functions are normal.  Glucose 192, BUN 65 and creatinine 1.82 mg/dL (lower than recent baseline).  Hospital Course:  1-acute on chronic diastolic heart failure -Volume status improved/stabilized. -Patient with good response to IV diuretics. -At discharge patient's home dose of Lasix has been resumed by nephrology service recommendation. -Advised to check her weight on daily basis -Continue low-sodium diet and control fluid intake.  2-acute on chronic kidney disease stage IV -In the setting of decreased perfusion from heart failure exacerbation -Creatinine ranges mostly from 1.8-2.2 as an outpatient with a GFR in the 20s -Creatinine at baseline at discharge -Outpatient follow-up with nephrologist recommended.  3-anemia -Hemoglobin typically in the 8-9 range. -No overt bleeding appreciated -Hemoglobin in the dropping in the 6.5 range throughout this hospitalization -1 unit of PRBCs along with IV iron has been provided -Continue outpatient follow-up Epogen and iron  infusion as Per nephrology discretion -Hemoglobin 7.5 and  is stable at discharge.  3-paroxysmal atrial fibrillation -Rate controlled -Continue apixaban for secondary prevention.  4-history of depression/anxiety -Continue buspirone and Lexapro -Mood overall stable.  5-hypothyroidism -Continue Synthroid.  6-cirrhosis noted on prior imaging studies -Will recommend outpatient follow-up with gastroenterology service for further work-up and evaluation -No ascites on physical exam and patient otherwise asymptomatic. Procedures: See below for x-ray reports  Consultations: Nephrology service  Discharge Exam: Vitals:   07/26/20 0442 07/26/20 1434  BP: (!) 154/58 (!) 144/49  Pulse: 78 69  Resp: 18 17  Temp: 99 F (37.2 C) 98.4 F (36.9 C)  SpO2: 99% 100%   General exam: Appears comfortable; no chest pain, no nausea, no vomiting, expressed no shortness of breath or orthopnea.  Feeling good and wanting to go home HEENT: PERRLA, oral mucosa moist, no sclera icterus or thrush Respiratory system: Decreased breath sounds at the bases; no wheezing, no frank crackles on examination.  Good saturation on room air.  No using accessory muscle. Cardiovascular system: S1 and S2 appreciated.  Positive systolic murmur.  No rubs or gallops.  No JVD on exam Gastrointestinal system: Soft, nontender, nondistended, positive bowel sounds. Central nervous system: Alert and oriented.  No focal neurologic deficits appreciated. Extremities: No cyanosis or clubbing; 1 + pitting edema appreciated. Skin: No petechiae. Psychiatry:  Mood & affect stable and appropriate.  Discharge Instructions   Discharge Instructions     (HEART FAILURE PATIENTS) Call MD:  Anytime you have any of the following symptoms: 1) 3 pound weight gain in 24 hours or 5 pounds in 1 week 2) shortness of breath, with or without a dry hacking cough 3) swelling in the hands, feet or stomach 4) if you have to sleep on extra pillows at night in order to breathe.   Complete by: As directed    Diet -  low sodium heart healthy   Complete by: As directed    Discharge instructions   Complete by: As directed    Take medications as prescribed Maintain adequate hydration (aiming for a full restriction of 1.5-1.8 L daily) Follow heart healthy/low-sodium diet Arrange follow-up with PCP in 10 days Follow-up with your nephrologist in 1 week as an outpatient   Increase activity slowly   Complete by: As directed       Allergies as of 07/26/2020       Reactions   Ampicillin Rash        Medication List     STOP taking these medications    benzonatate 100 MG capsule Commonly known as: TESSALON   promethazine-dextromethorphan 6.25-15 MG/5ML syrup Commonly known as: PROMETHAZINE-DM       TAKE these medications    acetaminophen 650 MG CR tablet Commonly known as: TYLENOL Take 650 mg by mouth every 8 (eight) hours as needed for pain.   acetaZOLAMIDE 250 MG tablet Commonly known as: DIAMOX Take 1 tablet by mouth daily.   amLODipine 10 MG tablet Commonly known as: NORVASC Take 5 mg by mouth daily.   apixaban 2.5 MG Tabs tablet Commonly known as: ELIQUIS Take 2.5 mg by mouth 2 (two) times daily.   atorvastatin 80 MG tablet Commonly known as: LIPITOR Take 1 tablet (80 mg total) by mouth every evening.   busPIRone 10 MG tablet Commonly known as: BUSPAR Take 10 mg by mouth 2 (two) times daily.   [employer] Diclofenac Sodium 1 % Gel Generic drug: diclofenac Sodium Apply 2 g topically 4 (four) times  daily as needed (pain).   escitalopram 10 MG tablet Commonly known as: LEXAPRO Take 10 mg by mouth daily.   ferrous sulfate 325 (65 FE) MG tablet Take 1 tablet by mouth 2 (two) times daily.   furosemide 20 MG tablet Commonly known as: LASIX Take 2 tablets (40 mg total) by mouth daily.   gabapentin 300 MG capsule Commonly known as: NEURONTIN Take 1 capsule by mouth daily as needed (nerve pain).   hydrALAZINE 50 MG tablet Commonly known as: APRESOLINE Take 1 tablet (50 mg  total) by mouth 3 (three) times daily. What changed:  medication strength how much to take   isosorbide dinitrate 30 MG tablet Commonly known as: ISORDIL Take 30 mg by mouth 3 (three) times daily.   levothyroxine 100 MCG tablet Commonly known as: SYNTHROID Take 100 mcg by mouth daily before breakfast.   omeprazole 20 MG capsule Commonly known as: PRILOSEC Take 20 mg by mouth daily as needed (reflux).   sodium bicarbonate 650 MG tablet Take 1,300 mg by mouth 2 (two) times daily.   traMADol 50 MG tablet Commonly known as: ULTRAM Take 50 mg by mouth daily as needed for moderate pain.   vitamin A & D ointment Apply 1 application topically as needed for dry skin.       Allergies  Allergen Reactions   Ampicillin Rash    Follow-up Information     Vasireddy, Lanetta Inch, MD. Schedule an appointment as soon as possible for a visit in 10 day(s).   Specialty: Internal Medicine Contact information: [place] [address] [phone]                 The results of significant diagnostics from this hospitalization (including imaging, microbiology, ancillary and laboratory) are listed below for reference.    Significant Diagnostic Studies: DG Chest 2 View  Result Date: 07/24/2020 CLINICAL DATA:  Shortness of breath and coughing EXAM: CHEST - 2 VIEW COMPARISON:  07/21/2020 FINDINGS: Patchy bilateral airspace opacity, again favoring pneumonia. Stable cardiomegaly. No visible effusion or pneumothorax. IMPRESSION: Unchanged bilateral pulmonary infiltrates. Electronically Signed   By: Monte Fantasia M.D.   On: 07/24/2020 11:21   DG Chest 2 View  Result Date: 07/21/2020 CLINICAL DATA:  Shortness of breath, COVID positive 06/14/2020 EXAM: CHEST - 2 VIEW COMPARISON:  06/18/2020 FINDINGS: Patchy right lower lobe opacity, progressive from the prior, suspicious for pneumonia. Mild left basilar opacity, similar to the prior, atelectasis versus pneumonia. No  pleural effusion or pneumothorax. Cardiomegaly.  Thoracic aortic atherosclerosis. IMPRESSION: Progressive right lower lobe opacity, suspicious for pneumonia. Mild left basilar opacity, atelectasis versus pneumonia. Electronically Signed   By: Julian Hy M.D.   On: 07/21/2020 19:55   ECHOCARDIOGRAM COMPLETE  Result Date: 07/22/2020    ECHOCARDIOGRAM REPORT   Patient Name:   LAYLANIE KRUCZEK Date of Exam: 07/22/2020 Medical Rec #:  [id number]          Height:       67.0 in Accession #:    [id number]         Weight:       167.0 lb Date of Birth:  June 18, 1940          BSA:          1.873 m Patient Age:    10 years           BP:           204/68 mmHg Patient Gender: F  HR:           88 bpm. Exam Location:  [facility] Procedure: 2D Echo, Cardiac Doppler and Color Doppler Indications:    CHF Acute Diastolic  History:        Patient has prior history of Echocardiogram examinations, most                 recent 02/14/2018. CHF, Stroke, Arrythmias:Atrial Fibrillation;                 Risk Factors:Hypertension and Diabetes.  Sonographer:    Wenda Low Referring Phys: [id number] [place]  1. Left ventricular ejection fraction, by estimation, is 55 to 60%. The left ventricle has normal function. The left ventricle has no regional wall motion abnormalities. There is mild left ventricular hypertrophy. Left ventricular diastolic parameters are indeterminate.  2. Right ventricular systolic function is mildly reduced. The right ventricular size is mildly enlarged. Mildly increased right ventricular wall thickness.  3. Left atrial size was moderately dilated.  4. Right atrial size was mildly dilated.  5. Trivial mitral valve regurgitation.  6. AV is thickened, calcified with restricted motion. Peak and mean pressures through the valve are 37 and 20 mm Hg resepctively AVA (VTI) is 1.2 cm2. Dimentionless index is 0.39 consistent with moderate aortic stenosis. Since echo from Jan 2020 there  is no significant change.. Aortic valve regurgitation is mild.  7. The inferior vena cava is dilated in size with <50% respiratory variability, suggesting right atrial pressure of 15 mmHg. FINDINGS  Left Ventricle: Left ventricular ejection fraction, by estimation, is 55 to 60%. The left ventricle has normal function. The left ventricle has no regional wall motion abnormalities. The left ventricular internal cavity size was normal in size. There is  mild left ventricular hypertrophy. Left ventricular diastolic parameters are indeterminate. Right Ventricle: The right ventricular size is mildly enlarged. Mildly increased right ventricular wall thickness. Right ventricular systolic function is mildly reduced. Left Atrium: Left atrial size was moderately dilated. Right Atrium: Right atrial size was mildly dilated. Pericardium: Trivial pericardial effusion is present. Mitral Valve: There is mild thickening of the mitral valve leaflet(s). Mild mitral annular calcification. Trivial mitral valve regurgitation. MV peak gradient, 13.5 mmHg. The mean mitral valve gradient is 3.0 mmHg. Tricuspid Valve: The tricuspid valve is normal in structure. Tricuspid valve regurgitation is mild. Aortic Valve: AV is thickened, calcified with restricted motion. Peak and mean pressures through the valve are 37 and 20 mm Hg resepctively AVA (VTI) is 1.2 cm2. Dimentionless index is 0.39 consistent with moderate aortic stenosis. Since echo from Jan 2020 there is no significant change. Aortic valve regurgitation is mild. Aortic regurgitation PHT measures 379 msec. Aortic valve mean gradient measures 19.5 mmHg. Aortic valve peak gradient measures 37.0 mmHg. Aortic valve area, by VTI measures 1.12 cm. Pulmonic Valve: The pulmonic valve was grossly normal. Pulmonic valve regurgitation is trivial. Aorta: The aortic root and ascending aorta are structurally normal, with no evidence of dilitation. Venous: The inferior vena cava is dilated in size with  less than 50% respiratory variability, suggesting right atrial pressure of 15 mmHg. IAS/Shunts: No atrial level shunt detected by color flow Doppler.  LEFT VENTRICLE PLAX 2D LVIDd:         4.26 cm  Diastology LVIDs:         3.07 cm  LV e' medial:    8.19 cm/s LV PW:         1.48 cm  LV E/e' medial:  19.3 LV IVS:        0.98 cm  LV e' lateral:   9.89 cm/s LVOT diam:     1.90 cm  LV E/e' lateral: 16.0 LV SV:         86 LV SV Index:   46 LVOT Area:     2.84 cm  RIGHT VENTRICLE RV Basal diam:  5.21 cm RV Mid diam:    3.21 cm RV S prime:     9.81 cm/s TAPSE (M-mode): 2.8 cm LEFT ATRIUM              Index       RIGHT ATRIUM           Index LA diam:        5.40 cm  2.88 cm/m  RA Area:     20.70 cm LA Vol (A2C):   118.0 ml 63.00 ml/m RA Volume:   66.60 ml  35.56 ml/m LA Vol (A4C):   81.7 ml  43.62 ml/m LA Biplane Vol: 105.0 ml 56.06 ml/m  AORTIC VALVE AV Area (Vmax):    1.18 cm AV Area (Vmean):   1.18 cm AV Area (VTI):     1.12 cm AV Vmax:           304.00 cm/s AV Vmean:          202.500 cm/s AV VTI:            0.765 m AV Peak Grad:      37.0 mmHg AV Mean Grad:      19.5 mmHg LVOT Vmax:         127.00 cm/s LVOT Vmean:        84.400 cm/s LVOT VTI:          0.302 m LVOT/AV VTI ratio: 0.39 AI PHT:            379 msec  AORTA Ao Root diam: 3.30 cm Ao Asc diam:  3.80 cm MITRAL VALVE                TRICUSPID VALVE MV Area (PHT): 3.83 cm     TR Peak grad:   38.4 mmHg MV Area VTI:   1.97 cm     TR Vmax:        310.00 cm/s MV Peak grad:  13.5 mmHg MV Mean grad:  3.0 mmHg     SHUNTS MV Vmax:       1.84 m/s     Systemic VTI:  0.30 m MV Vmean:      71.7 cm/s    Systemic Diam: 1.90 cm MV Decel Time: 198 msec MV E velocity: 158.00 cm/s MV A velocity: 64.00 cm/s MV E/A ratio:  2.47 Dorris Carnes MD Electronically signed by Dorris Carnes MD Signature Date/Time: 07/22/2020/5:33:45 PM    Final     Microbiology: Recent Results (from the past 240 hour(s))  Culture, blood (Routine X 2) w Reflex to ID Panel     Status: None   Collection  Time: 07/21/20 10:58 PM   Specimen: BLOOD  Result Value Ref Range Status   Specimen Description BLOOD RIGHT ANTECUBITAL  Final   Special Requests   Final    BOTTLES DRAWN AEROBIC AND ANAEROBIC Blood Culture adequate volume   Culture   Final    NO GROWTH 5 DAYS Performed at [facility], [address] [place], [place] [address]    Report Status 07/26/2020 FINAL  Final  Culture, blood (Routine X 2) w Reflex  to ID Panel     Status: None   Collection Time: 07/21/20 11:19 PM   Specimen: BLOOD RIGHT FOREARM  Result Value Ref Range Status   Specimen Description BLOOD RIGHT FOREARM  Final   Special Requests   Final    BOTTLES DRAWN AEROBIC AND ANAEROBIC Blood Culture adequate volume   Culture   Final    NO GROWTH 5 DAYS Performed at [facility], [address], [place], [place] [address]    Report Status 07/26/2020 FINAL  Final  SARS CORONAVIRUS 2 (TAT 6-24 HRS) Nasopharyngeal Nasopharyngeal Swab     Status: None   Collection Time: 07/22/20 12:15 AM   Specimen: Nasopharyngeal Swab  Result Value Ref Range Status   SARS Coronavirus 2 NEGATIVE NEGATIVE Final    Comment: (NOTE) SARS-CoV-2 target nucleic acids are NOT DETECTED.  The SARS-CoV-2 RNA is generally detectable in upper and lower respiratory specimens during the acute phase of infection. Negative results do not preclude SARS-CoV-2 infection, do not rule out co-infections with other pathogens, and should not be used as the sole basis for treatment or other patient management decisions. Negative results must be combined with clinical observations, patient history, and epidemiological information. The expected result is Negative.  Fact Sheet for Patients: SugarRoll.be  Fact Sheet for Healthcare Providers: https://www.woods-mathews.com/  This test is not yet approved or cleared by the [place] FDA and  has been authorized for detection and/or diagnosis of SARS-CoV-2 by FDA under  an Emergency Use Authorization (EUA). This EUA will remain  in effect (meaning this test can be used) for the duration of the COVID-19 declaration under Se ction 564(b)(1) of the Act, 21 U.S.C. section 360bbb-3(b)(1), unless the authorization is terminated or revoked sooner.  Performed at [facility], [place] [address] [place], [place] [address]      Labs: Basic Metabolic Panel: Recent Labs  Lab 07/22/20 0455 07/23/20 0459 07/24/20 0405 07/25/20 0412 07/26/20 0339  NA 138 139 138 137 134*  K 4.6 4.8 4.2 4.5 3.8  CL 107 107 106 105 105  CO2 22 24 25 28 24   GLUCOSE 183* 166* 125* 155* 152*  BUN 70* 68* 67* 61* 60*  CREATININE 1.94* 2.26* 2.36* 2.29* 2.19*  CALCIUM 8.3* 8.1* 7.9* 7.6* 7.4*   Liver Function Tests: Recent Labs  Lab 07/21/20 2258  AST 25  ALT 20  ALKPHOS 51  BILITOT 0.1*  PROT 7.2  ALBUMIN 3.0*   CBC: Recent Labs  Lab 07/21/20 2258 07/22/20 0455 07/25/20 0412 07/25/20 0628 07/25/20 0839 07/25/20 1655 07/26/20 0339  WBC 6.2 6.2 5.7 6.0 6.2  --  6.9  NEUTROABS 4.8  --   --   --   --   --   --   HGB 7.3* 7.2* 6.4* 6.6* 6.5* 7.9* 7.5*  HCT 23.8* 23.3* 21.9* 22.6* 22.4* 26.0* 24.2*  MCV 86.9 86.9 89.8 90.4 89.6  --  89.0  PLT 335 309 291 304 289  --  275   BNP (last 3 results) Recent Labs    07/21/20 2258  BNP 1,321.0*   Signed:  Barton Dubois MD.  [facility] 07/26/2020, 3:09 PM

## 2020-07-26 NOTE — Care Management Important Message (Signed)
Important Message  Patient Details  Name: Beth Padilla MRN: [id number] Date of Birth: 25-Dec-1940   Medicare Important Message Given:  Yes     [name] 07/26/2020, 1:36 PM

## 2020-07-26 NOTE — Progress Notes (Signed)
Patient ID: Beth Padilla, female   DOB: February 18, 1940, 80 y.o.   MRN: [id number] S: left arm IV infiltrated yesterday while receiving IV iron.  No new complaints  O:BP (!) 154/58 (BP Location: Right Arm)   Pulse 78   Temp 99 F (37.2 C) (Oral)   Resp 18   Ht 5\' 7"  (1.702 m)   Wt 74.3 kg   SpO2 99%   BMI 25.64 kg/m   Intake/Output Summary (Last 24 hours) at 07/26/2020 0842 Last data filed at 07/26/2020 0454 Gross per 24 hour  Intake 1572 ml  Output 1500 ml  Net 72 ml   Intake/Output: I/O last 3 completed shifts: In: 9563 [P.O.:720; I.V.:250; Blood:332; IV Piggyback:270] Out: 2100 [Urine:2100]  Intake/Output this shift:  No intake/output data recorded. Weight change:  Gen: NAD CVS:IRR, III/VI SEM Resp:cta Abd: +BS, soft, NT/ND Ext: trace-1+ pretibial/pedal edema bilaterally  Recent Labs  Lab 07/21/20 2258 07/22/20 0455 07/23/20 0459 07/24/20 0405 07/25/20 0412 07/26/20 0339  NA 138 138 139 138 137 134*  K 4.5 4.6 4.8 4.2 4.5 3.8  CL 108 107 107 106 105 105  CO2 24 22 24 25 28 24   GLUCOSE 192* 183* 166* 125* 155* 152*  BUN 65* 70* 68* 67* 61* 60*  CREATININE 1.82* 1.94* 2.26* 2.36* 2.29* 2.19*  ALBUMIN 3.0*  --   --   --   --   --   CALCIUM 8.1* 8.3* 8.1* 7.9* 7.6* 7.4*  AST 25  --   --   --   --   --   ALT 20  --   --   --   --   --    Liver Function Tests: Recent Labs  Lab 07/21/20 2258  AST 25  ALT 20  ALKPHOS 51  BILITOT 0.1*  PROT 7.2  ALBUMIN 3.0*   No results for input(s): LIPASE, AMYLASE in the last 168 hours. No results for input(s): AMMONIA in the last 168 hours. CBC: Recent Labs  Lab 07/21/20 2258 07/22/20 0455 07/25/20 0412 07/25/20 0628 07/25/20 0839 07/25/20 1655 07/26/20 0339  WBC 6.2 6.2 5.7 6.0 6.2  --  6.9  NEUTROABS 4.8  --   --   --   --   --   --   HGB 7.3* 7.2* 6.4* 6.6* 6.5* 7.9* 7.5*  HCT 23.8* 23.3* 21.9* 22.6* 22.4* 26.0* 24.2*  MCV 86.9 86.9 89.8 90.4 89.6  --  89.0  PLT 335 309 291 304 289  --  275   Cardiac  Enzymes: No results for input(s): CKTOTAL, CKMB, CKMBINDEX, TROPONINI in the last 168 hours. CBG: No results for input(s): GLUCAP in the last 168 hours.  Iron Studies:  Recent Labs    07/25/20 0628  IRON 12*  TIBC 305  FERRITIN 12   Studies/Results: DG Chest 2 View  Result Date: 07/24/2020 CLINICAL DATA:  Shortness of breath and coughing EXAM: CHEST - 2 VIEW COMPARISON:  07/21/2020 FINDINGS: Patchy bilateral airspace opacity, again favoring pneumonia. Stable cardiomegaly. No visible effusion or pneumothorax. IMPRESSION: Unchanged bilateral pulmonary infiltrates. Electronically Signed   By: Monte Fantasia M.D.   On: 07/24/2020 11:21    acetaZOLAMIDE  250 mg Oral Daily   amLODipine  5 mg Oral Daily   apixaban  2.5 mg Oral BID   atorvastatin  80 mg Oral QPM   busPIRone  10 mg Oral BID   escitalopram  10 mg Oral Daily   furosemide  40 mg Oral Daily   hydrALAZINE  50 mg Oral TID   isosorbide dinitrate  30 mg Oral TID   levothyroxine  100 mcg Oral QAC breakfast   sodium bicarbonate  1,300 mg Oral BID    BMET    Component Value Date/Time   NA 134 (L) 07/26/2020 0339   K 3.8 07/26/2020 0339   CL 105 07/26/2020 0339   CO2 24 07/26/2020 0339   GLUCOSE 152 (H) 07/26/2020 0339   BUN 60 (H) 07/26/2020 0339   CREATININE 2.19 (H) 07/26/2020 0339   CALCIUM 7.4 (L) 07/26/2020 0339   GFRNONAA 22 (L) 07/26/2020 0339   GFRAA 23 (L) 05/04/2019 0701   CBC    Component Value Date/Time   WBC 6.9 07/26/2020 0339   RBC 2.72 (L) 07/26/2020 0339   HGB 7.5 (L) 07/26/2020 0339   HCT 24.2 (L) 07/26/2020 0339   PLT 275 07/26/2020 0339   MCV 89.0 07/26/2020 0339   MCH 27.6 07/26/2020 0339   MCHC 31.0 07/26/2020 0339   RDW 19.9 (H) 07/26/2020 0339   LYMPHSABS 0.9 07/21/2020 2258   MONOABS 0.5 07/21/2020 2258   EOSABS 0.1 07/21/2020 2258   BASOSABS 0.0 07/21/2020 2258     Assessment/Plan:  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  REsume outpatient epo per her primary nephrologist in [place] Chronic diastolic CHF- stable and cont with home lasix dose of 40 mg po daily CKD stage IV - followed by [facility] in [place], [place].  Currently Scr is at baseline.  Nothing further to add.  Will sign off.  Please call with questions or concerns.  HTN - stable Atrial fib per primary  Donetta Potts, MD [facility] [phone]

## 2020-08-03 ENCOUNTER — Observation Stay (HOSPITAL_COMMUNITY): Payer: Medicare Other

## 2020-08-03 ENCOUNTER — Other Ambulatory Visit: Payer: Self-pay

## 2020-08-03 ENCOUNTER — Inpatient Hospital Stay (HOSPITAL_COMMUNITY)
Admission: EM | Admit: 2020-08-03 | Discharge: 2020-08-07 | DRG: 377 | Disposition: A | Discharge status: Home / Self Care | Payer: Medicare Other | Attending: Family Medicine | Admitting: Family Medicine

## 2020-08-03 ENCOUNTER — Encounter (HOSPITAL_COMMUNITY): Payer: Self-pay | Admitting: *Deleted

## 2020-08-03 DIAGNOSIS — E871 Hypo-osmolality and hyponatremia: Secondary | ICD-10-CM | POA: Diagnosis present

## 2020-08-03 DIAGNOSIS — R195 Other fecal abnormalities: Secondary | ICD-10-CM

## 2020-08-03 DIAGNOSIS — N184 Chronic kidney disease, stage 4 (severe): Secondary | ICD-10-CM | POA: Diagnosis present

## 2020-08-03 DIAGNOSIS — I5033 Acute on chronic diastolic (congestive) heart failure: Secondary | ICD-10-CM | POA: Diagnosis present

## 2020-08-03 DIAGNOSIS — I350 Nonrheumatic aortic (valve) stenosis: Secondary | ICD-10-CM | POA: Diagnosis present

## 2020-08-03 DIAGNOSIS — D649 Anemia, unspecified: Secondary | ICD-10-CM

## 2020-08-03 DIAGNOSIS — D631 Anemia in chronic kidney disease: Secondary | ICD-10-CM | POA: Diagnosis present

## 2020-08-03 DIAGNOSIS — K921 Melena: Secondary | ICD-10-CM | POA: Diagnosis present

## 2020-08-03 DIAGNOSIS — K254 Chronic or unspecified gastric ulcer with hemorrhage: Principal | ICD-10-CM | POA: Diagnosis present

## 2020-08-03 DIAGNOSIS — K922 Gastrointestinal hemorrhage, unspecified: Secondary | ICD-10-CM | POA: Diagnosis present

## 2020-08-03 DIAGNOSIS — K259 Gastric ulcer, unspecified as acute or chronic, without hemorrhage or perforation: Secondary | ICD-10-CM | POA: Diagnosis not present

## 2020-08-03 DIAGNOSIS — Z79890 Hormone replacement therapy (postmenopausal): Secondary | ICD-10-CM | POA: Diagnosis not present

## 2020-08-03 DIAGNOSIS — E785 Hyperlipidemia, unspecified: Secondary | ICD-10-CM | POA: Diagnosis present

## 2020-08-03 DIAGNOSIS — E039 Hypothyroidism, unspecified: Secondary | ICD-10-CM | POA: Diagnosis present

## 2020-08-03 DIAGNOSIS — K7460 Unspecified cirrhosis of liver: Secondary | ICD-10-CM | POA: Diagnosis present

## 2020-08-03 DIAGNOSIS — K219 Gastro-esophageal reflux disease without esophagitis: Secondary | ICD-10-CM | POA: Diagnosis present

## 2020-08-03 DIAGNOSIS — I130 Hypertensive heart and chronic kidney disease with heart failure and stage 1 through stage 4 chronic kidney disease, or unspecified chronic kidney disease: Secondary | ICD-10-CM | POA: Diagnosis present

## 2020-08-03 DIAGNOSIS — I5032 Chronic diastolic (congestive) heart failure: Secondary | ICD-10-CM | POA: Diagnosis present

## 2020-08-03 DIAGNOSIS — Z20822 Contact with and (suspected) exposure to covid-19: Secondary | ICD-10-CM | POA: Diagnosis present

## 2020-08-03 DIAGNOSIS — D62 Acute posthemorrhagic anemia: Secondary | ICD-10-CM | POA: Diagnosis present

## 2020-08-03 DIAGNOSIS — Z8616 Personal history of COVID-19: Secondary | ICD-10-CM

## 2020-08-03 DIAGNOSIS — I480 Paroxysmal atrial fibrillation: Secondary | ICD-10-CM | POA: Diagnosis present

## 2020-08-03 DIAGNOSIS — F419 Anxiety disorder, unspecified: Secondary | ICD-10-CM | POA: Diagnosis present

## 2020-08-03 DIAGNOSIS — E1121 Type 2 diabetes mellitus with diabetic nephropathy: Secondary | ICD-10-CM | POA: Diagnosis present

## 2020-08-03 DIAGNOSIS — Z8673 Personal history of transient ischemic attack (TIA), and cerebral infarction without residual deficits: Secondary | ICD-10-CM | POA: Diagnosis not present

## 2020-08-03 DIAGNOSIS — R932 Abnormal findings on diagnostic imaging of liver and biliary tract: Secondary | ICD-10-CM | POA: Diagnosis not present

## 2020-08-03 DIAGNOSIS — K2970 Gastritis, unspecified, without bleeding: Secondary | ICD-10-CM | POA: Diagnosis present

## 2020-08-03 DIAGNOSIS — Z79899 Other long term (current) drug therapy: Secondary | ICD-10-CM

## 2020-08-03 DIAGNOSIS — Z823 Family history of stroke: Secondary | ICD-10-CM

## 2020-08-03 DIAGNOSIS — Z7901 Long term (current) use of anticoagulants: Secondary | ICD-10-CM

## 2020-08-03 DIAGNOSIS — E1165 Type 2 diabetes mellitus with hyperglycemia: Secondary | ICD-10-CM | POA: Diagnosis present

## 2020-08-03 DIAGNOSIS — Z809 Family history of malignant neoplasm, unspecified: Secondary | ICD-10-CM

## 2020-08-03 DIAGNOSIS — I10 Essential (primary) hypertension: Secondary | ICD-10-CM | POA: Diagnosis present

## 2020-08-03 DIAGNOSIS — F32A Depression, unspecified: Secondary | ICD-10-CM | POA: Diagnosis present

## 2020-08-03 DIAGNOSIS — Z880 Allergy status to penicillin: Secondary | ICD-10-CM

## 2020-08-03 LAB — CBC WITH DIFFERENTIAL/PLATELET
Abs Immature Granulocytes: 0.02 10*3/uL (ref 0.00–0.07)
Basophils Absolute: 0 10*3/uL (ref 0.0–0.1)
Basophils Relative: 0 %
Eosinophils Absolute: 0 10*3/uL (ref 0.0–0.5)
Eosinophils Relative: 0 %
HCT: 20.8 % — ABNORMAL LOW (ref 36.0–46.0)
Hemoglobin: 6.1 g/dL — CL (ref 12.0–15.0)
Immature Granulocytes: 0 %
Lymphocytes Relative: 9 %
Lymphs Abs: 0.7 10*3/uL (ref 0.7–4.0)
MCH: 26.9 pg (ref 26.0–34.0)
MCHC: 29.3 g/dL — ABNORMAL LOW (ref 30.0–36.0)
MCV: 91.6 fL (ref 80.0–100.0)
Monocytes Absolute: 0.4 10*3/uL (ref 0.1–1.0)
Monocytes Relative: 5 %
Neutro Abs: 6.9 10*3/uL (ref 1.7–7.7)
Neutrophils Relative %: 86 %
Platelets: 344 10*3/uL (ref 150–400)
RBC: 2.27 MIL/uL — ABNORMAL LOW (ref 3.87–5.11)
RDW: 20.5 % — ABNORMAL HIGH (ref 11.5–15.5)
WBC: 8.1 10*3/uL (ref 4.0–10.5)
nRBC: 0 % (ref 0.0–0.2)

## 2020-08-03 LAB — BASIC METABOLIC PANEL
Anion gap: 9 (ref 5–15)
BUN: 72 mg/dL — ABNORMAL HIGH (ref 8–23)
CO2: 22 mmol/L (ref 22–32)
Calcium: 8.1 mg/dL — ABNORMAL LOW (ref 8.9–10.3)
Chloride: 103 mmol/L (ref 98–111)
Creatinine, Ser: 2 mg/dL — ABNORMAL HIGH (ref 0.44–1.00)
GFR, Estimated: 25 mL/min — ABNORMAL LOW (ref >60)
Glucose, Bld: 195 mg/dL — ABNORMAL HIGH (ref 70–99)
Potassium: 4.7 mmol/L (ref 3.5–5.1)
Sodium: 134 mmol/L — ABNORMAL LOW (ref 135–145)

## 2020-08-03 LAB — HEPATIC FUNCTION PANEL
ALT: 16 U/L (ref 0–44)
AST: 21 U/L (ref 15–41)
Albumin: 2.6 g/dL — ABNORMAL LOW (ref 3.5–5.0)
Alkaline Phosphatase: 44 U/L (ref 38–126)
Bilirubin, Direct: 0.1 mg/dL (ref 0.0–0.2)
Indirect Bilirubin: 0.3 mg/dL (ref 0.3–0.9)
Total Bilirubin: 0.4 mg/dL (ref 0.3–1.2)
Total Protein: 6.5 g/dL (ref 6.5–8.1)

## 2020-08-03 LAB — POC OCCULT BLOOD, ED: Fecal Occult Bld: POSITIVE — AB

## 2020-08-03 LAB — GLUCOSE, CAPILLARY
Glucose-Capillary: 225 mg/dL — ABNORMAL HIGH (ref 70–99)
Glucose-Capillary: 284 mg/dL — ABNORMAL HIGH (ref 70–99)
Glucose-Capillary: 267 mg/dL — ABNORMAL HIGH (ref 70–99)

## 2020-08-03 LAB — PREPARE RBC (CROSSMATCH)

## 2020-08-03 LAB — PROTIME-INR
INR: 1.4 — ABNORMAL HIGH (ref 0.8–1.2)
Prothrombin Time: 16.9 seconds — ABNORMAL HIGH (ref 11.4–15.2)

## 2020-08-03 MED ORDER — FUROSEMIDE 40 MG PO TABS
40.0000 mg | ORAL_TABLET | Freq: Every day | ORAL | Status: DC
  Administered 2020-08-03 – 2020-08-05 (×3): 40 mg via ORAL
  Filled 2020-08-03 (×4): qty 1

## 2020-08-03 MED ORDER — PANTOPRAZOLE SODIUM 40 MG IV SOLR: 40.0000 mg | Freq: Two times a day (BID) | INTRAVENOUS | Status: DC

## 2020-08-03 MED ORDER — FUROSEMIDE 40 MG PO TABS: 40.0000 mg | ORAL_TABLET | Freq: Every day | ORAL | Status: DC

## 2020-08-03 MED ORDER — ONDANSETRON HCL 4 MG PO TABS: 4.0000 mg | ORAL_TABLET | Freq: Four times a day (QID) | ORAL | Status: DC | PRN

## 2020-08-03 MED ORDER — PANTOPRAZOLE SODIUM 40 MG IV SOLR: 40.0000 mg | Freq: Once | INTRAVENOUS | Status: DC

## 2020-08-03 MED ORDER — TRAZODONE HCL 50 MG PO TABS
50.0000 mg | ORAL_TABLET | Freq: Every evening | ORAL | Status: DC | PRN
  Administered 2020-08-04 – 2020-08-05 (×2): 50 mg via ORAL
  Filled 2020-08-03 (×2): qty 1

## 2020-08-03 MED ORDER — ONDANSETRON HCL 4 MG/2ML IJ SOLN
4.0000 mg | Freq: Four times a day (QID) | INTRAMUSCULAR | Status: DC | PRN
  Filled 2020-08-03: qty 2

## 2020-08-03 MED ORDER — SODIUM CHLORIDE 0.9 % IV SOLN
50.0000 ug/h | INTRAVENOUS | Status: DC
  Administered 2020-08-03 – 2020-08-05 (×4): 50 ug/h via INTRAVENOUS
  Filled 2020-08-03 (×8): qty 1

## 2020-08-03 MED ORDER — ACETAZOLAMIDE 250 MG PO TABS
250.0000 mg | ORAL_TABLET | Freq: Every day | ORAL | Status: DC
  Administered 2020-08-04 – 2020-08-07 (×4): 250 mg via ORAL
  Filled 2020-08-03 (×7): qty 1

## 2020-08-03 MED ORDER — ISOSORBIDE DINITRATE 20 MG PO TABS
30.0000 mg | ORAL_TABLET | Freq: Three times a day (TID) | ORAL | Status: DC
  Administered 2020-08-03 – 2020-08-07 (×12): 30 mg via ORAL
  Filled 2020-08-03 (×12): qty 2

## 2020-08-03 MED ORDER — ACETAMINOPHEN 650 MG RE SUPP: 650.0000 mg | Freq: Four times a day (QID) | RECTAL | Status: DC | PRN

## 2020-08-03 MED ORDER — BUSPIRONE HCL 5 MG PO TABS
10.0000 mg | ORAL_TABLET | Freq: Two times a day (BID) | ORAL | Status: DC
  Administered 2020-08-03 – 2020-08-07 (×8): 10 mg via ORAL
  Filled 2020-08-03 (×8): qty 2

## 2020-08-03 MED ORDER — POLYETHYLENE GLYCOL 3350 17 G PO PACK: 17.0000 g | PACK | Freq: Every day | ORAL | Status: DC | PRN

## 2020-08-03 MED ORDER — PANTOPRAZOLE 80MG IVPB - SIMPLE MED: 80.0000 mg | Freq: Once | INTRAVENOUS | Status: DC

## 2020-08-03 MED ORDER — INSULIN ASPART 100 UNIT/ML IJ SOLN
0.0000 [IU] | Freq: Every day | INTRAMUSCULAR | Status: DC
  Administered 2020-08-03: 3 [IU] via SUBCUTANEOUS
  Administered 2020-08-05 – 2020-08-06 (×2): 2 [IU] via SUBCUTANEOUS

## 2020-08-03 MED ORDER — OCTREOTIDE LOAD VIA INFUSION
50.0000 ug | Freq: Once | INTRAVENOUS | Status: AC
  Administered 2020-08-03: 50 ug via INTRAVENOUS
  Filled 2020-08-03: qty 25

## 2020-08-03 MED ORDER — INSULIN ASPART 100 UNIT/ML IJ SOLN
0.0000 [IU] | Freq: Three times a day (TID) | INTRAMUSCULAR | Status: DC
  Administered 2020-08-04: 1 [IU] via SUBCUTANEOUS
  Administered 2020-08-04: 2 [IU] via SUBCUTANEOUS
  Administered 2020-08-04 – 2020-08-06 (×5): 1 [IU] via SUBCUTANEOUS
  Administered 2020-08-06: 2 [IU] via SUBCUTANEOUS
  Administered 2020-08-06 – 2020-08-07 (×3): 1 [IU] via SUBCUTANEOUS

## 2020-08-03 MED ORDER — AMLODIPINE BESYLATE 5 MG PO TABS
5.0000 mg | ORAL_TABLET | Freq: Every day | ORAL | Status: DC
  Administered 2020-08-03 – 2020-08-07 (×5): 5 mg via ORAL
  Filled 2020-08-03 (×5): qty 1

## 2020-08-03 MED ORDER — SODIUM CHLORIDE 0.9 % IV SOLN
2.0000 g | INTRAVENOUS | Status: DC
  Administered 2020-08-03 – 2020-08-06 (×4): 2 g via INTRAVENOUS
  Filled 2020-08-03 (×5): qty 20

## 2020-08-03 MED ORDER — SODIUM CHLORIDE 0.9% FLUSH: 3.0000 mL | INTRAVENOUS | Status: DC | PRN

## 2020-08-03 MED ORDER — CIPROFLOXACIN IN D5W 400 MG/200ML IV SOLN: 400.0000 mg | Freq: Two times a day (BID) | INTRAVENOUS | Status: DC

## 2020-08-03 MED ORDER — LEVOTHYROXINE SODIUM 100 MCG PO TABS
100.0000 ug | ORAL_TABLET | Freq: Every day | ORAL | Status: DC
  Administered 2020-08-04 – 2020-08-07 (×4): 100 ug via ORAL
  Filled 2020-08-03 (×4): qty 1

## 2020-08-03 MED ORDER — PANTOPRAZOLE SODIUM 40 MG IV SOLR
40.0000 mg | Freq: Two times a day (BID) | INTRAVENOUS | Status: DC
  Administered 2020-08-03 – 2020-08-07 (×8): 40 mg via INTRAVENOUS
  Filled 2020-08-03 (×9): qty 40

## 2020-08-03 MED ORDER — SODIUM CHLORIDE 0.9% FLUSH
3.0000 mL | Freq: Two times a day (BID) | INTRAVENOUS | Status: DC
  Administered 2020-08-03 – 2020-08-07 (×7): 3 mL via INTRAVENOUS

## 2020-08-03 MED ORDER — PANTOPRAZOLE INFUSION (NEW) - SIMPLE MED: 8.0000 mg/h | INTRAVENOUS | Status: DC

## 2020-08-03 MED ORDER — GABAPENTIN 300 MG PO CAPS: 300.0000 mg | ORAL_CAPSULE | Freq: Every day | ORAL | Status: DC | PRN

## 2020-08-03 MED ORDER — DARBEPOETIN ALFA 100 MCG/0.5ML IJ SOSY
100.0000 ug | PREFILLED_SYRINGE | Freq: Once | INTRAMUSCULAR | Status: AC
Start: 2020-08-03 — End: 2020-08-03
  Administered 2020-08-03: 100 ug via SUBCUTANEOUS
  Filled 2020-08-03: qty 0.5

## 2020-08-03 MED ORDER — ADULT MULTIVITAMIN W/MINERALS CH
1.0000 | ORAL_TABLET | Freq: Every day | ORAL | Status: DC
  Administered 2020-08-03 – 2020-08-07 (×5): 1 via ORAL
  Filled 2020-08-03 (×5): qty 1

## 2020-08-03 MED ORDER — FUROSEMIDE 10 MG/ML IJ SOLN
40.0000 mg | Freq: Once | INTRAMUSCULAR | Status: AC
  Administered 2020-08-03: 40 mg via INTRAVENOUS
  Filled 2020-08-03: qty 4

## 2020-08-03 MED ORDER — ATORVASTATIN CALCIUM 40 MG PO TABS
80.0000 mg | ORAL_TABLET | Freq: Every evening | ORAL | Status: DC
  Administered 2020-08-03 – 2020-08-06 (×4): 80 mg via ORAL
  Filled 2020-08-03 (×4): qty 2

## 2020-08-03 MED ORDER — SODIUM CHLORIDE 0.9 % IV SOLN: 1.0000 g | INTRAVENOUS | Status: DC

## 2020-08-03 MED ORDER — SODIUM CHLORIDE 0.9% FLUSH
3.0000 mL | Freq: Two times a day (BID) | INTRAVENOUS | Status: DC
  Administered 2020-08-03 – 2020-08-04 (×2): 3 mL via INTRAVENOUS

## 2020-08-03 MED ORDER — HYDRALAZINE HCL 25 MG PO TABS
50.0000 mg | ORAL_TABLET | Freq: Three times a day (TID) | ORAL | Status: DC
  Administered 2020-08-03 – 2020-08-07 (×12): 50 mg via ORAL
  Filled 2020-08-03 (×12): qty 2

## 2020-08-03 MED ORDER — ESCITALOPRAM OXALATE 10 MG PO TABS
10.0000 mg | ORAL_TABLET | Freq: Every day | ORAL | Status: DC
  Administered 2020-08-03 – 2020-08-07 (×5): 10 mg via ORAL
  Filled 2020-08-03 (×5): qty 1

## 2020-08-03 MED ORDER — SODIUM BICARBONATE 650 MG PO TABS
1300.0000 mg | ORAL_TABLET | Freq: Two times a day (BID) | ORAL | Status: DC
  Administered 2020-08-03 – 2020-08-07 (×8): 1300 mg via ORAL
  Filled 2020-08-03 (×8): qty 2

## 2020-08-03 MED ORDER — SODIUM CHLORIDE 0.9 % IV SOLN
10.0000 mL/h | Freq: Once | INTRAVENOUS | Status: AC
  Administered 2020-08-03: 10 mL/h via INTRAVENOUS

## 2020-08-03 MED ORDER — SODIUM CHLORIDE 0.9 % IV SOLN: 250.0000 mL | INTRAVENOUS | Status: DC | PRN

## 2020-08-03 MED ORDER — ACETAMINOPHEN 325 MG PO TABS: 650.0000 mg | ORAL_TABLET | Freq: Four times a day (QID) | ORAL | Status: DC | PRN

## 2020-08-03 MED ORDER — BISACODYL 10 MG RE SUPP: 10.0000 mg | Freq: Every day | RECTAL | Status: DC | PRN

## 2020-08-03 NOTE — ED Triage Notes (Signed)
States she was advise by  her PCP to come in for transfusion due to low hemoglobin

## 2020-08-03 NOTE — H&P (Signed)
Patient Demographics:    Beth Padilla, is a 80 y.o. female  MRN: [id number]   DOB - 1940/12/06  Admit Date - 08/03/2020  Outpatient Primary MD for the patient is Vasireddy, Lanetta Inch, MD   Assessment & Plan:    Principal Problem:   Acute GI bleeding Active Problems:   H/O: CVA (cerebrovascular accident)-- Has Afib   CKD (chronic kidney disease), stage IV (HCC)   Type 2 diabetes with nephropathy (HCC)   HTN (hypertension)   Hypothyroidism   Paroxysmal atrial fibrillation (HCC)   Chronic diastolic HF (heart failure) ([place])   Uncontrolled type 2 diabetes mellitus with hyperglycemia (HCC)   Heme positive stool   Black stool   Abnormal CT of liver    1) acute on chronic anemia most likely due to ABLA--dark stools + heme positive GI consult for possible endoluminal evaluation requested  on 07/26/2020 Hgb was 7.5 , Hgb is now 6.1 -She missed a dose of her Procrit and since discharge from the hospital on 07/26/2020 she has not been taking iron supplementation --GI input appreciated --Last dose of Eliquis was p.m. of 08/02/2020 -INR is 1.4, Hgb 6.1, platelets 344 -GI service would like to wait until the a.m. of Thursday 08/05/2020 to do EGD due to recent Eliquis administration -Transfuse 2 units of PRBC with Lasix -IV Protonix,  IV octreotide added by GI service due to concerns about possible cirrhosis  2)PAFIb--history of prior stroke in 2016- -Very high risk for another stroke  CHA2DS2- VASc score   is =  8 (age x 2, h/o CVA  x2, CHF x1, HTN x 1, gender x 1, DM x 1)   Which is  equal to = 11.2 % annual risk of stroke  -We need to restart Eliquis as soon as it is feasible pending endoscopic findings  3) CKD stage - IV --Creatinine is 2.0 which is close to patient's baseline BUN is 72, GFR is 25 -Renal  function appears to be close to her baseline, anticipate further bump in BUN due to GI bleed  -renally adjust medications, avoid nephrotoxic agents / dehydration  / hypotension  4)DM 2--A1c 6.8 reflecting good diabetic control PTA -Avoid over aggressive control at this time as patient is to be n.p.o. 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 fib, please see #2 above -Continue Lipitor, Eliquis on hold as above #2  Disposition/Need for in-Hospital Stay- patient unable to be discharged at this time due to -acute on chronic anemia-due to acute GI bleed requiring transfusion of PRBC and endoluminal evaluation to determine when Eliquis can be restarted*  Status is: Inpatient  Remains inpatient appropriate because: Please see disposition above  Dispo: The patient is from: Home              Anticipated d/c is to: Home              Anticipated d/c date is: 2 days              Patient currently is not medically stable to d/c. Barriers: Not Clinically Stable-    With History of - Reviewed by me  Past Medical History:  Diagnosis Date   Anemia    Atrial fibrillation  ([place])    Depression    Diabetes mellitus    Hyperlipidemia 12/15/2010   Hypertension    Hypothyroidism 12/15/2010   Pneumonia    Renal disorder       Past Surgical History:  Procedure Laterality Date   ABDOMINAL HYSTERECTOMY     EP IMPLANTABLE DEVICE N/A 07/21/2014   Procedure: Loop Recorder Insertion;  Surgeon: [name], MD;  Location: [facility];  Service: Cardiovascular;  Laterality: N/A;   implantable loop recorder removal  10/14/2018   MDT Reveal LINQ removed in office by Dr [name]   TEE WITHOUT CARDIOVERSION N/A 07/21/2014   Procedure: TRANSESOPHAGEAL ECHOCARDIOGRAM (TEE);  Surgeon: [name], MD;  Location: [facility] ENDOSCOPY;  Service: Cardiovascular;  Laterality: N/A;    CC--- gi bleed, fatigue     HPI:    Beth Padilla  is a 80 y.o. 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 Cirrhosis on CT April 2021, dchf who presents to the ED with a hemoglobin of 6.1 after being told to come here by PCP due to low hemoglobin on CBC that was drawn on her posthospitalization checkup -Patient complains of fatigue malaise poor appetite and occasional dizzy spells No syncope, no chest pains no shortness of breath No fever  Or chills  Patient was recently treated for CHF at this hospital discharge on 07/26/2020 with hemoglobin of 7.5 at the time -She missed a dose of her Procrit and since discharge from the hospital on 07/26/2020 she has not been taking iron supplementation --GI input appreciated -Hemoccult is positive in the ED -Last dose of Eliquis was p.m. of 08/02/2020 -INR is 1.4, Hgb 6.1, platelets 344 -Creatinine is 2.0 which is close to patient's baseline BUN is 72 with a GFR of 25 which is close to patient's baseline, bicarb is 22 and anion gap is not elevated   Review of systems:    In  addition to the HPI above,   A full Review of  Systems was done, all other systems reviewed are negative except as noted above in HPI , .    Social History:  Reviewed by me    Social History   Tobacco Use   Smoking status: Never   Smokeless tobacco: Never  Substance Use Topics   Alcohol use: No  Family History :  Reviewed by me    Family History  Problem Relation Age of Onset   Stroke Mother    Cancer Sister      Home Medications:   Prior to Admission medications   Medication Sig Start Date End Date Taking? Authorizing Provider  acetaminophen (TYLENOL) 650 MG CR tablet Take 650 mg by mouth every 8 (eight) hours as needed for pain.    [provider]  acetaZOLAMIDE (DIAMOX) 250 MG tablet Take 1 tablet by mouth daily. 11/30/17   [provider]  amLODipine (NORVASC) 10 MG tablet Take 5 mg by mouth daily. 06/18/18   [provider]  apixaban (ELIQUIS) 2.5 MG TABS tablet Take 2.5 mg by mouth 2 (two) times daily.    [provider]  atorvastatin (LIPITOR) 80 MG tablet Take 1 tablet (80 mg total) by mouth every evening. 07/22/14   [name], MD  busPIRone (BUSPAR) 10 MG tablet Take 10 mg by mouth 2 (two) times daily. 07/14/20   [provider]  [employer] DICLOFENAC SODIUM 1 % GEL Apply 2 g topically 4 (four) times daily as needed (pain). 07/08/20   [provider]  escitalopram (LEXAPRO) 10 MG tablet Take 10 mg by mouth daily.    [provider]  ferrous sulfate 325 (65 FE) MG tablet Take 1 tablet by mouth 2 (two) times daily. 11/09/17   [provider]  furosemide (LASIX) 20 MG tablet Take 2 tablets (40 mg total) by mouth daily. 07/26/20   Barton Dubois, MD  gabapentin (NEURONTIN) 300 MG capsule Take 1 capsule by mouth daily as needed (nerve pain). 01/03/18   [provider]  hydrALAZINE (APRESOLINE) 50 MG tablet Take 1 tablet (50 mg total) by mouth 3 (three) times daily. 07/26/20   Barton Dubois, MD   isosorbide dinitrate (ISORDIL) 30 MG tablet Take 30 mg by mouth 3 (three) times daily.  09/18/15   [provider]  levothyroxine (SYNTHROID, LEVOTHROID) 100 MCG tablet Take 100 mcg by mouth daily before breakfast.    [provider]  omeprazole (PRILOSEC) 20 MG capsule Take 20 mg by mouth daily as needed (reflux).    [provider]  sodium bicarbonate 650 MG tablet Take 1,300 mg by mouth 2 (two) times daily.    [provider]  traMADol (ULTRAM) 50 MG tablet Take 50 mg by mouth daily as needed for moderate pain. 05/17/20   [provider]  Vitamins A & D (VITAMIN A & D) ointment Apply 1 application topically as needed for dry skin. 11/09/17   [provider]     Allergies:     Allergies  Allergen Reactions   Ampicillin Rash     Physical Exam:   Vitals  Blood pressure (!) 177/57, pulse 72, temperature 98.3 F (36.8 C), temperature source Oral, resp. rate 20, height 5\' 7"  (1.702 m), weight 74.7 kg, SpO2 100 %.  Physical Examination: General appearance - alert and in no distress  Mental status - alert, oriented to person, place, and time,  Mouth--missing teeth, poor dentition Eyes -pale conjunctiva Neck - supple, no JVD elevation , Chest - clear  to auscultation bilaterally, symmetrical air movement,  Heart - S1 and S2 normal, irregular regular, 2/6 systolic murmur  abdomen - soft, nontender, nondistended, no CVA area tenderness  neurological - screening mental status exam normal, neck supple without rigidity, cranial nerves II through XII intact, DTR's normal and symmetric Extremities - no pedal edema noted, intact peripheral pulses  Skin -warm, dry    Data Review:    CBC Recent Labs  Lab 08/03/20 1321  WBC 8.1  HGB 6.1*  HCT 20.8*  PLT 344  MCV 91.6  MCH 26.9  MCHC 29.3*  RDW 20.5*  LYMPHSABS 0.7  MONOABS 0.4  EOSABS 0.0  BASOSABS 0.0    ------------------------------------------------------------------------------------------------------------------  Chemistries  Recent Labs  Lab 08/03/20 1321  NA 134*  K 4.7  CL 103  CO2 22  GLUCOSE 195*  BUN 72*  CREATININE 2.00*  CALCIUM 8.1*   ------------------------------------------------------------------------------------------------------------------ estimated creatinine clearance is 23.3 mL/min (A) (by C-G formula based on SCr of 2 mg/dL (H)). ------------------------------------------------------------------------------------------------------------------ No results for input(s): TSH, T4TOTAL, T3FREE, THYROIDAB in the last 72 hours.  Invalid input(s): FREET3   Coagulation profile Recent Labs  Lab 08/03/20 1321  INR 1.4*   ------------------------------------------------------------------------------------------------------------------- No results for input(s): DDIMER in the last 72 hours. -------------------------------------------------------------------------------------------------------------------  Cardiac Enzymes No results for input(s): CKMB, TROPONINI, MYOGLOBIN in the last 168 hours.  Invalid input(s): CK ------------------------------------------------------------------------------------------------------------------    Component Value Date/Time   BNP 1,321.0 (H) 07/21/2020 2258    Urinalysis    Component Value Date/Time   COLORURINE STRAW (A) 02/13/2018 [place] 02/13/2018 1705   LABSPEC 1.006 02/13/2018 1705   PHURINE 7.0 02/13/2018 1705   GLUCOSEU NEGATIVE 02/13/2018 1705   HGBUR NEGATIVE 02/13/2018 1705   [place] 02/13/2018 [place] 02/13/2018 1705   PROTEINUR 30 (A) 02/13/2018 1705   UROBILINOGEN 0.2 07/18/2014 2120   NITRITE NEGATIVE 02/13/2018 1705   LEUKOCYTESUR SMALL (A) 02/13/2018 1705     Imaging Results:    No results found.  Radiological Exams on Admission: No  results found.  DVT Prophylaxis -SCD/Gi Bleed  AM Labs Ordered, also please review Full Orders  Family Communication: Admission, patients condition and plan of care including tests being ordered have been discussed with the patient  who indicate understanding and agree with the plan   Code Status - Full Code  Likely DC to after endoluminal evaluation  Condition   stable  Roxan Hockey M.D on 08/03/2020 at 5:14 PM Go to [email or web address] -  for contact info  [facility] - Office  [phone]

## 2020-08-03 NOTE — H&P (View-Only) (Signed)
@LOGO @   Referring Provider: [facility] hospital[facility] Primary Care Physician:  Kendrick Ranch, MD Primary Gastroenterologist:  Dr. Jenetta Downer (previously unassigned)  Date of Admission: 08/03/20 Date of Consultation: 08/03/20  Reason for Consultation:  Anemia, heme positive stool  HPI:  Beth Padilla is a 80 y.o. year old female with history of chronic anemia, heme positive stool previously, paroxysmal atrial fibrillation on Eliquis, moderate aortic stenosis, stroke, diabetes, HTN, HLD, CKD, hypothyroidism, noted nodular liver on CT April 2021, recent hospitalization earlier this month, discharged on 7/11 for acute on chronic CHF.  Her hemoglobin did drop to 6.5 range during hospitalization and required 1 unit PRBCs along with IV iron, hemoglobin 7.5 at discharge.    She presented to the emergency room today after having follow-up with PCP and found to have hemoglobin of 6.9 and advised to proceed to the hospital for blood transfusion.  ED course: Hemodynamically stable. Hemoglobin 6.1, fecal occult blood positive. Cr 2.0, BUN 72 Mild hyponatremia at 134, potassium 4.7. INR 1.4  2 units PRBCs has been ordered.  She was started on PPI infusion.  Today:  Had COVID around [facility] day. Hasn't been back to nephrology for iron or Epogen since then. Had been getting this every 2 weeks. Also takes iron BID. But hasn't been since hospital discharge.   Notices stools are black with iron. Since prior hospital discharge, she hasn't had any black stools as she hasn't taken iron. No brbpr.  Noted weakness this morning.  Denies lightheadedness, dizziness.  Denies shortness of breath or cough. Denies abdominal pain, nausea, vomiting. Had history of GERD. Used to take omeprazole 20 mg daily, but hasn't taken this in 2-3 weeks. Had discussed this with PCP. No GERD symptoms. No dysphagia.   Hasn't had a great appetite since COVID. Denies weight loss.   NSAIDs: None   Prior EGD/Colonoscopy:   Think she had an EGD a few years ago in [place] that was normal.  Not sure why she had the EEG done.  Denies history of peptic ulcer disease.   Reports having a colonoscopy years ago in [place] as well and believes this was normal.  Cirrhosis:  Alcohol: None Illicit drug use: None  Family history of liver disease: None.   Last dose of Eliquis: Last dose was last night (7/18).   *Per ED nurse, when she completed fecal occult test, stool was pitch black.  Past Medical History:  Diagnosis Date   Anemia    Atrial fibrillation ([place])    Depression    Diabetes mellitus    Hyperlipidemia 12/15/2010   Hypertension    Hypothyroidism 12/15/2010   Pneumonia    Renal disorder     Past Surgical History:  Procedure Laterality Date   ABDOMINAL HYSTERECTOMY     EP IMPLANTABLE DEVICE N/A 07/21/2014   Procedure: Loop Recorder Insertion;  Surgeon: [name], MD;  Location: [facility];  Service: Cardiovascular;  Laterality: N/A;   implantable loop recorder removal  10/14/2018   MDT Reveal LINQ removed in office by Dr [name]   TEE WITHOUT CARDIOVERSION N/A 07/21/2014   Procedure: TRANSESOPHAGEAL ECHOCARDIOGRAM (TEE);  Surgeon: [name], MD;  Location: [facility] ENDOS[facility];  Service: Cardiovascular;  Laterality: N/A;    Prior to Admission medications   Medication Sig Start Date End Date Taking? Authorizing Provider  acetaminophen (TYLENOL) 650 MG CR tablet Take 650 mg by mouth every 8 (eight) hours as needed for pain.    [provider]  acetaZOLAMIDE (DIAMOX) 250 MG  tablet Take 1 tablet by mouth daily. 11/30/17   [provider]  amLODipine (NORVASC) 10 MG tablet Take 5 mg by mouth daily. 06/18/18   [provider]  apixaban (ELIQUIS) 2.5 MG TABS tablet Take 2.5 mg by mouth 2 (two) times daily.    [provider]  atorvastatin (LIPITOR) 80 MG tablet Take 1 tablet (80 mg total) by mouth every evening. 07/22/14   [name], MD  busPIRone (BUSPAR) 10 MG  tablet Take 10 mg by mouth 2 (two) times daily. 07/14/20   [provider]  [employer] DICLOFENAC SODIUM 1 % GEL Apply 2 g topically 4 (four) times daily as needed (pain). 07/08/20   [provider]  escitalopram (LEXAPRO) 10 MG tablet Take 10 mg by mouth daily.    [provider]  ferrous sulfate 325 (65 FE) MG tablet Take 1 tablet by mouth 2 (two) times daily. 11/09/17   [provider]  furosemide (LASIX) 20 MG tablet Take 2 tablets (40 mg total) by mouth daily. 07/26/20   Barton Dubois, MD  gabapentin (NEURONTIN) 300 MG capsule Take 1 capsule by mouth daily as needed (nerve pain). 01/03/18   [provider]  hydrALAZINE (APRESOLINE) 50 MG tablet Take 1 tablet (50 mg total) by mouth 3 (three) times daily. 07/26/20   Barton Dubois, MD  isosorbide dinitrate (ISORDIL) 30 MG tablet Take 30 mg by mouth 3 (three) times daily.  09/18/15   [provider]  levothyroxine (SYNTHROID, LEVOTHROID) 100 MCG tablet Take 100 mcg by mouth daily before breakfast.    [provider]  omeprazole (PRILOSEC) 20 MG capsule Take 20 mg by mouth daily as needed (reflux).    [provider]  sodium bicarbonate 650 MG tablet Take 1,300 mg by mouth 2 (two) times daily.    [provider]  traMADol (ULTRAM) 50 MG tablet Take 50 mg by mouth daily as needed for moderate pain. 05/17/20   [provider]  Vitamins A & D (VITAMIN A & D) ointment Apply 1 application topically as needed for dry skin. 11/09/17   [provider]    Current Facility-Administered Medications  Medication Dose Route Frequency Provider Last Rate Last Admin   0.9 %  sodium chloride infusion  10 mL/hr Intravenous Once Dorie Rank, MD       Darbepoetin Alfa (ARANESP) injection 100 mcg  100 mcg Subcutaneous Once Emokpae, Courage, MD       pantoprazole (PROTONIX) 80 mg /NS 100 mL IVPB  80 mg Intravenous Once Roxan Hockey, MD       [START ON 08/07/2020] pantoprazole  (PROTONIX) injection 40 mg  40 mg Intravenous Q12H Emokpae, Courage, MD       pantoprozole (PROTONIX) 80 mg /NS 100 mL infusion  8 mg/hr Intravenous Continuous Emokpae, Courage, MD       Current Outpatient Medications  Medication Sig Dispense Refill   acetaminophen (TYLENOL) 650 MG CR tablet Take 650 mg by mouth every 8 (eight) hours as needed for pain.     acetaZOLAMIDE (DIAMOX) 250 MG tablet Take 1 tablet by mouth daily.     amLODipine (NORVASC) 10 MG tablet Take 5 mg by mouth daily.     apixaban (ELIQUIS) 2.5 MG TABS tablet Take 2.5 mg by mouth 2 (two) times daily.     atorvastatin (LIPITOR) 80 MG tablet Take 1 tablet (80 mg total) by mouth every evening. 30 tablet 2   busPIRone (BUSPAR) 10 MG tablet Take 10 mg by mouth  2 (two) times daily.     [employer] DICLOFENAC SODIUM 1 % GEL Apply 2 g topically 4 (four) times daily as needed (pain).     escitalopram (LEXAPRO) 10 MG tablet Take 10 mg by mouth daily.     ferrous sulfate 325 (65 FE) MG tablet Take 1 tablet by mouth 2 (two) times daily.     furosemide (LASIX) 20 MG tablet Take 2 tablets (40 mg total) by mouth daily.     gabapentin (NEURONTIN) 300 MG capsule Take 1 capsule by mouth daily as needed (nerve pain).     hydrALAZINE (APRESOLINE) 50 MG tablet Take 1 tablet (50 mg total) by mouth 3 (three) times daily. 90 tablet 1   isosorbide dinitrate (ISORDIL) 30 MG tablet Take 30 mg by mouth 3 (three) times daily.      levothyroxine (SYNTHROID, LEVOTHROID) 100 MCG tablet Take 100 mcg by mouth daily before breakfast.     omeprazole (PRILOSEC) 20 MG capsule Take 20 mg by mouth daily as needed (reflux).     sodium bicarbonate 650 MG tablet Take 1,300 mg by mouth 2 (two) times daily.     traMADol (ULTRAM) 50 MG tablet Take 50 mg by mouth daily as needed for moderate pain.     Vitamins A & D (VITAMIN A & D) ointment Apply 1 application topically as needed for dry skin.      Allergies as of 08/03/2020 - Review Complete 08/03/2020  Allergen Reaction  Noted   Ampicillin Rash 09/20/2012    Family History  Problem Relation Age of Onset   Stroke Mother    Cancer Sister     Social History   Socioeconomic History   Marital status: Widowed    Spouse name: Not on file   Number of children: 5   Years of education: 45   Highest education level: Not on file  Occupational History   Occupation: retired  Tobacco Use   Smoking status: Never   Smokeless tobacco: Never  Substance and Sexual Activity   Alcohol use: No   Drug use: No   Sexual activity: Yes    Birth control/protection: Surgical  Other Topics Concern   Not on file  Social History Narrative   Patient drinks about 2-3 cups of caffeine daily.   Patient is ambidextrous.   Social Determinants of Health   Financial Resource Strain: Not on file  Food Insecurity: Not on file  Transportation Needs: Not on file  Physical Activity: Not on file  Stress: Not on file  Social Connections: Not on file  Intimate Partner Violence: Not on file    Review of Systems: Gen: Denies fever, chills, cold or flulike symptoms. CV: Denies chest pain or palpitations. Resp: Denies shortness of breath or cough. GI: See HPI GU : See HPI MS: Denies joint pain Derm: Denies rash Psych: Denies depression, anxiety Heme: See HPI  Physical Exam: Vital signs in last 24 hours: Temp:  [98.4 F (36.9 C)] 98.4 F (36.9 C) (07/19 1124) Pulse Rate:  [75-80] 75 (07/19 1500) Resp:  [18-20] 19 (07/19 1315) BP: (154-188)/(52-65) 188/55 (07/19 1500) SpO2:  [96 %-100 %] 100 % (07/19 1500)   General:   Alert,  Well-developed, well-nourished, pleasant and cooperative in NAD Head:  Normocephalic and atraumatic. Eyes:  Sclera clear, no icterus.   Conjunctiva pink. Ears:  Normal auditory acuity. Lungs:  Clear throughout to auscultation.   No wheezes, crackles, or rhonchi. No acute distress. Heart: Irregular irregular rate and rhythm.  Harsh systolic  murmur.  Abdomen:  Soft, nontender and nondistended.  No masses, hepatosplenomegaly or hernias noted. Normal bowel sounds, without guarding, and without rebound.   Rectal:  Deferred   Msk:  Symmetrical without gross deformities. Normal posture. Extremities:  With 1-2+ bilateral lower extremity pitting edema below the knees.   Neurologic:  Alert and  oriented x4;  grossly normal neurologically. Skin:  Intact without significant lesions or rashes. Psych: Normal mood and affect.  Intake/Output from previous day: No intake/output data recorded. Intake/Output this shift: No intake/output data recorded.  Lab Results: Recent Labs    08/03/20 1321  WBC 8.1  HGB 6.1*  HCT 20.8*  PLT 344    PT/INR Recent Labs    08/03/20 1321  LABPROT 16.9*  INR 1.4*    Impression: 80 year old female with history of chronic anemia, heme positive stool previously, paroxysmal atrial fibrillation on Eliquis, moderate aortic stenosis, stroke, diabetes, HTN, HLD, CKD, hypothyroidism, noted nodular liver on CT April 2021, recent hospitalization earlier this month , discharged on 7/11 for acute on chronic CHF.  Her hemoglobin did drop to 6.5 range during hospitalization and required 1 unit PRBCs along with IV iron, hemoglobin 7.5 at discharge. She returned to the emergency room today at the recommendations of PCP due to low hemoglobin with need for blood transfusion.  In the ED, she was hemodynamically stable and found to have a hemoglobin of 6.1 with FOBT positive.  2 units PRBCs ordered and PPI infusion ordered, but not yet started.   Acute on chronic anemia with heme positive stool: Chronic anemia with component of iron deficiency, hemoglobin in the 8-9 range at baseline, likely multifactorial in setting of CKD and chronic blood loss, receiving IV iron and Epogen with nephrology, also on oral iron.  Noted decline in hemoglobin earlier this month and received 1 unit PRBCs during hospitalization for CHF exacerbation with hemoglobin of 7.5 on day of hospital discharge  (7/11), now hemoglobin down to 6.1, stool heme positive, BUN 72, creatinine 2.0. Patient reports history of black stool in the setting of oral iron, but denies black stools over the last week. Per ED nurse, when completing focal occult test, stool was pitch black. Patient denies BRBPR or any significant GI symptoms.  Previously on omeprazole for GERD, but discontinued 3 to 4 weeks ago per PCP and has remained asymptomatic.  Denies NSAIDs or alcohol use.  Reports history of EGD and colonoscopy in [place] that were "normal", but I am not able to view these in our system.   Concern for upper GI bleed with differentials including esophagitis, gastritis, duodenitis, PUD, AVMs.  With question of cirrhosis, cannot rule out oozing from portal hypertensive gastropathy, not consistent with esophageal variceal bleed.  Notably, patient also tells me she has not had Epogen in the last 2 months which may also be contributing to her declining hemoglobin. She will need EGD for further evaluation.  We will start octreotide and ciprofloxacin for SBP prophylaxis in light of possible cirrhosis. No ceftriaxone due to allergy to ampicillin.   Coagulopathy: In the setting of Eliquis and ?underlying cirrhosis.  We will hold Eliquis for now due to GI bleeding and need for endoscopy.  Continue to monitor.  Nodular liver on CT: Nodular liver noted on CT A/P without contrast April 2021.  No follow-up thus far.  Aside from chronic peripheral edema in the setting of CHF, she has no signs or symptoms of decompensated liver disease.  Notably, spleen was normal on CT and platelets remain  normal.  Denies any known history of liver disease.  No history of chronic alcohol use or illicit drug use.  No family history of liver disease. LFTs within normal limits earlier this month.   Plan: Agree with transfusing 2 unit PRBCs.  Start octreotide infusion. Stop PPI infusion. Start IV PPI twice daily. IV Ciprofloxacin 400 mg daily for SBP  prophylaxis due to ampicillin allergy.  Ciprofloxacin dosed renally adjusted. Needs EGD for further evaluation of acute on chronic anemia, heme positive stool, and melena.  Discussed with Dr. Jenetta Downer.  As last dose of Eliquis was the evening of 7/18 and patient has CKD, recommended EGD on Thursday. Monitor H/H closely and for overt GI bleeding. Continue to hold Eliquis. Will obtain HFP and ultrasound with elastography in light of possible cirrhosis on prior CT.   LOS: 0 days    08/03/2020, 3:24 PM   Aliene Altes, [facility]

## 2020-08-03 NOTE — Consult Note (Signed)
@LOGO @   Referring Provider: [facility] hospitalist Primary Care Physician:  Kendrick Ranch, MD Primary Gastroenterologist:  Dr. Jenetta Downer (previously unassigned)  Date of Admission: 08/03/20 Date of Consultation: 08/03/20  Reason for Consultation:  Anemia, heme positive stool  HPI:  Beth Padilla is a 80 y.o. year old female with history of chronic anemia, heme positive stool previously, paroxysmal atrial fibrillation on Eliquis, moderate aortic stenosis, stroke, diabetes, HTN, HLD, CKD, hypothyroidism, noted nodular liver on CT April 2021, recent hospitalization earlier this month, discharged on 7/11 for acute on chronic CHF.  Her hemoglobin did drop to 6.5 range during hospitalization and required 1 unit PRBCs along with IV iron, hemoglobin 7.5 at discharge.    She presented to the emergency room today after having follow-up with PCP and found to have hemoglobin of 6.9 and advised to proceed to the hospital for blood transfusion.  ED course: Hemodynamically stable. Hemoglobin 6.1, fecal occult blood positive. Cr 2.0, BUN 72 Mild hyponatremia at 134, potassium 4.7. INR 1.4  2 units PRBCs has been ordered.  She was started on PPI infusion.  Today:  Had COVID around [place] day. Hasn't been back to nephrology for iron or Epogen since then. Had been getting this every 2 weeks. Also takes iron BID. But hasn't been since hospital discharge.   Notices stools are black with iron. Since prior hospital discharge, she hasn't had any black stools as she hasn't taken iron. No brbpr.  Noted weakness this morning.  Denies lightheadedness, dizziness.  Denies shortness of breath or cough. Denies abdominal pain, nausea, vomiting. Had history of GERD. Used to take omeprazole 20 mg daily, but hasn't taken this in 2-3 weeks. Had discussed this with PCP. No GERD symptoms. No dysphagia.   Hasn't had a great appetite since COVID. Denies weight loss.   NSAIDs: None   Prior EGD/Colonoscopy:   Think she had an EGD a few years ago in [place] that was normal.  Not sure why she had the EEG done.  Denies history of peptic ulcer disease.   Reports having a colonoscopy years ago in [place] as well and believes this was normal.  Cirrhosis:  Alcohol: None Illicit drug use: None  Family history of liver disease: None.   Last dose of Eliquis: Last dose was last night (7/18).   *Per ED nurse, when she completed fecal occult test, stool was pitch black.  Past Medical History:  Diagnosis Date   Anemia    Atrial fibrillation ([place])    Depression    Diabetes mellitus    Hyperlipidemia 12/15/2010   Hypertension    Hypothyroidism 12/15/2010   Pneumonia    Renal disorder     Past Surgical History:  Procedure Laterality Date   ABDOMINAL HYSTERECTOMY     EP IMPLANTABLE DEVICE N/A 07/21/2014   Procedure: Loop Recorder Insertion;  Surgeon: [name], MD;  Location: [facility];  Service: Cardiovascular;  Laterality: N/A;   implantable loop recorder removal  10/14/2018   MDT Reveal LINQ removed in office by Dr [name]   TEE WITHOUT CARDIOVERSION N/A 07/21/2014   Procedure: TRANSESOPHAGEAL ECHOCARDIOGRAM (TEE);  Surgeon: [name], MD;  Location: [facility];  Service: Cardiovascular;  Laterality: N/A;    Prior to Admission medications   Medication Sig Start Date End Date Taking? Authorizing Provider  acetaminophen (TYLENOL) 650 MG CR tablet Take 650 mg by mouth every 8 (eight) hours as needed for pain.    [provider]  acetaZOLAMIDE (DIAMOX) 250 MG  tablet Take 1 tablet by mouth daily. 11/30/17   [provider]  amLODipine (NORVASC) 10 MG tablet Take 5 mg by mouth daily. 06/18/18   [provider]  apixaban (ELIQUIS) 2.5 MG TABS tablet Take 2.5 mg by mouth 2 (two) times daily.    [provider]  atorvastatin (LIPITOR) 80 MG tablet Take 1 tablet (80 mg total) by mouth every evening. 07/22/14   [name], MD  busPIRone (BUSPAR) 10 MG  tablet Take 10 mg by mouth 2 (two) times daily. 07/14/20   [provider]  [employer] DICLOFENAC SODIUM 1 % GEL Apply 2 g topically 4 (four) times daily as needed (pain). 07/08/20   [provider]  escitalopram (LEXAPRO) 10 MG tablet Take 10 mg by mouth daily.    [provider]  ferrous sulfate 325 (65 FE) MG tablet Take 1 tablet by mouth 2 (two) times daily. 11/09/17   [provider]  furosemide (LASIX) 20 MG tablet Take 2 tablets (40 mg total) by mouth daily. 07/26/20   Barton Dubois, MD  gabapentin (NEURONTIN) 300 MG capsule Take 1 capsule by mouth daily as needed (nerve pain). 01/03/18   [provider]  hydrALAZINE (APRESOLINE) 50 MG tablet Take 1 tablet (50 mg total) by mouth 3 (three) times daily. 07/26/20   Barton Dubois, MD  isosorbide dinitrate (ISORDIL) 30 MG tablet Take 30 mg by mouth 3 (three) times daily.  09/18/15   [provider]  levothyroxine (SYNTHROID, LEVOTHROID) 100 MCG tablet Take 100 mcg by mouth daily before breakfast.    [provider]  omeprazole (PRILOSEC) 20 MG capsule Take 20 mg by mouth daily as needed (reflux).    [provider]  sodium bicarbonate 650 MG tablet Take 1,300 mg by mouth 2 (two) times daily.    [provider]  traMADol (ULTRAM) 50 MG tablet Take 50 mg by mouth daily as needed for moderate pain. 05/17/20   [provider]  Vitamins A & D (VITAMIN A & D) ointment Apply 1 application topically as needed for dry skin. 11/09/17   [provider]    Current Facility-Administered Medications  Medication Dose Route Frequency Provider Last Rate Last Admin   0.9 %  sodium chloride infusion  10 mL/hr Intravenous Once Dorie Rank, MD       Darbepoetin Alfa (ARANESP) injection 100 mcg  100 mcg Subcutaneous Once Emokpae, Courage, MD       pantoprazole (PROTONIX) 80 mg /NS 100 mL IVPB  80 mg Intravenous Once Roxan Hockey, MD       [START ON 08/07/2020] pantoprazole  (PROTONIX) injection 40 mg  40 mg Intravenous Q12H Emokpae, Courage, MD       pantoprozole (PROTONIX) 80 mg /NS 100 mL infusion  8 mg/hr Intravenous Continuous Emokpae, Courage, MD       Current Outpatient Medications  Medication Sig Dispense Refill   acetaminophen (TYLENOL) 650 MG CR tablet Take 650 mg by mouth every 8 (eight) hours as needed for pain.     acetaZOLAMIDE (DIAMOX) 250 MG tablet Take 1 tablet by mouth daily.     amLODipine (NORVASC) 10 MG tablet Take 5 mg by mouth daily.     apixaban (ELIQUIS) 2.5 MG TABS tablet Take 2.5 mg by mouth 2 (two) times daily.     atorvastatin (LIPITOR) 80 MG tablet Take 1 tablet (80 mg total) by mouth every evening. 30 tablet 2   busPIRone (BUSPAR) 10 MG tablet Take 10 mg by mouth  2 (two) times daily.     [employer] DICLOFENAC SODIUM 1 % GEL Apply 2 g topically 4 (four) times daily as needed (pain).     escitalopram (LEXAPRO) 10 MG tablet Take 10 mg by mouth daily.     ferrous sulfate 325 (65 FE) MG tablet Take 1 tablet by mouth 2 (two) times daily.     furosemide (LASIX) 20 MG tablet Take 2 tablets (40 mg total) by mouth daily.     gabapentin (NEURONTIN) 300 MG capsule Take 1 capsule by mouth daily as needed (nerve pain).     hydrALAZINE (APRESOLINE) 50 MG tablet Take 1 tablet (50 mg total) by mouth 3 (three) times daily. 90 tablet 1   isosorbide dinitrate (ISORDIL) 30 MG tablet Take 30 mg by mouth 3 (three) times daily.      levothyroxine (SYNTHROID, LEVOTHROID) 100 MCG tablet Take 100 mcg by mouth daily before breakfast.     omeprazole (PRILOSEC) 20 MG capsule Take 20 mg by mouth daily as needed (reflux).     sodium bicarbonate 650 MG tablet Take 1,300 mg by mouth 2 (two) times daily.     traMADol (ULTRAM) 50 MG tablet Take 50 mg by mouth daily as needed for moderate pain.     Vitamins A & D (VITAMIN A & D) ointment Apply 1 application topically as needed for dry skin.      Allergies as of 08/03/2020 - Review Complete 08/03/2020  Allergen Reaction  Noted   Ampicillin Rash 09/20/2012    Family History  Problem Relation Age of Onset   Stroke Mother    Cancer Sister     Social History   Socioeconomic History   Marital status: Widowed    Spouse name: Not on file   Number of children: 5   Years of education: 96   Highest education level: Not on file  Occupational History   Occupation: retired  Tobacco Use   Smoking status: Never   Smokeless tobacco: Never  Substance and Sexual Activity   Alcohol use: No   Drug use: No   Sexual activity: Yes    Birth control/protection: Surgical  Other Topics Concern   Not on file  Social History Narrative   Patient drinks about 2-3 cups of caffeine daily.   Patient is ambidextrous.   Social Determinants of Health   Financial Resource Strain: Not on file  Food Insecurity: Not on file  Transportation Needs: Not on file  Physical Activity: Not on file  Stress: Not on file  Social Connections: Not on file  Intimate Partner Violence: Not on file    Review of Systems: Gen: Denies fever, chills, cold or flulike symptoms. CV: Denies chest pain or palpitations. Resp: Denies shortness of breath or cough. GI: See HPI GU : See HPI MS: Denies joint pain Derm: Denies rash Psych: Denies depression, anxiety Heme: See HPI  Physical Exam: Vital signs in last 24 hours: Temp:  [98.4 F (36.9 C)] 98.4 F (36.9 C) (07/19 1124) Pulse Rate:  [75-80] 75 (07/19 1500) Resp:  [18-20] 19 (07/19 1315) BP: (154-188)/(52-65) 188/55 (07/19 1500) SpO2:  [96 %-100 %] 100 % (07/19 1500)   General:   Alert,  Well-developed, well-nourished, pleasant and cooperative in NAD Head:  Normocephalic and atraumatic. Eyes:  Sclera clear, no icterus.   Conjunctiva pink. Ears:  Normal auditory acuity. Lungs:  Clear throughout to auscultation.   No wheezes, crackles, or rhonchi. No acute distress. Heart: Irregular irregular rate and rhythm.  Harsh systolic  murmur.  Abdomen:  Soft, nontender and nondistended.  No masses, hepatosplenomegaly or hernias noted. Normal bowel sounds, without guarding, and without rebound.   Rectal:  Deferred   Msk:  Symmetrical without gross deformities. Normal posture. Extremities:  With 1-2+ bilateral lower extremity pitting edema below the knees.   Neurologic:  Alert and  oriented x4;  grossly normal neurologically. Skin:  Intact without significant lesions or rashes. Psych: Normal mood and affect.  Intake/Output from previous day: No intake/output data recorded. Intake/Output this shift: No intake/output data recorded.  Lab Results: Recent Labs    08/03/20 1321  WBC 8.1  HGB 6.1*  HCT 20.8*  PLT 344    PT/INR Recent Labs    08/03/20 1321  LABPROT 16.9*  INR 1.4*    Impression: 80 year old female with history of chronic anemia, heme positive stool previously, paroxysmal atrial fibrillation on Eliquis, moderate aortic stenosis, stroke, diabetes, HTN, HLD, CKD, hypothyroidism, noted nodular liver on CT April 2021, recent hospitalization earlier this month , discharged on 7/11 for acute on chronic CHF.  Her hemoglobin did drop to 6.5 range during hospitalization and required 1 unit PRBCs along with IV iron, hemoglobin 7.5 at discharge. She returned to the emergency room today at the recommendations of PCP due to low hemoglobin with need for blood transfusion.  In the ED, she was hemodynamically stable and found to have a hemoglobin of 6.1 with FOBT positive.  2 units PRBCs ordered and PPI infusion ordered, but not yet started.   Acute on chronic anemia with heme positive stool: Chronic anemia with component of iron deficiency, hemoglobin in the 8-9 range at baseline, likely multifactorial in setting of CKD and chronic blood loss, receiving IV iron and Epogen with nephrology, also on oral iron.  Noted decline in hemoglobin earlier this month and received 1 unit PRBCs during hospitalization for CHF exacerbation with hemoglobin of 7.5 on day of hospital discharge  (7/11), now hemoglobin down to 6.1, stool heme positive, BUN 72, creatinine 2.0. Patient reports history of black stool in the setting of oral iron, but denies black stools over the last week. Per ED nurse, when completing focal occult test, stool was pitch black. Patient denies BRBPR or any significant GI symptoms.  Previously on omeprazole for GERD, but discontinued 3 to 4 weeks ago per PCP and has remained asymptomatic.  Denies NSAIDs or alcohol use.  Reports history of EGD and colonoscopy in [place] that were "normal", but I am not able to view these in our system.   Concern for upper GI bleed with differentials including esophagitis, gastritis, duodenitis, PUD, AVMs.  With question of cirrhosis, cannot rule out oozing from portal hypertensive gastropathy, not consistent with esophageal variceal bleed.  Notably, patient also tells me she has not had Epogen in the last 2 months which may also be contributing to her declining hemoglobin. She will need EGD for further evaluation.  We will start octreotide and ciprofloxacin for SBP prophylaxis in light of possible cirrhosis. No ceftriaxone due to allergy to ampicillin.   Coagulopathy: In the setting of Eliquis and ?underlying cirrhosis.  We will hold Eliquis for now due to GI bleeding and need for endoscopy.  Continue to monitor.  Nodular liver on CT: Nodular liver noted on CT A/P without contrast April 2021.  No follow-up thus far.  Aside from chronic peripheral edema in the setting of CHF, she has no signs or symptoms of decompensated liver disease.  Notably, spleen was normal on CT and platelets remain  normal.  Denies any known history of liver disease.  No history of chronic alcohol use or illicit drug use.  No family history of liver disease. LFTs within normal limits earlier this month.   Plan: Agree with transfusing 2 unit PRBCs.  Start octreotide infusion. Stop PPI infusion. Start IV PPI twice daily. IV Ciprofloxacin 400 mg daily for SBP  prophylaxis due to ampicillin allergy.  Ciprofloxacin dosed renally adjusted. Needs EGD for further evaluation of acute on chronic anemia, heme positive stool, and melena.  Discussed with Dr. Jenetta Downer.  As last dose of Eliquis was the evening of 7/18 and patient has CKD, recommended EGD on Thursday. Monitor H/H closely and for overt GI bleeding. Continue to hold Eliquis. Will obtain HFP and ultrasound with elastography in light of possible cirrhosis on prior CT.   LOS: 0 days    08/03/2020, 3:24 PM   Aliene Altes, [facility]

## 2020-08-03 NOTE — ED Notes (Signed)
Date and time results received: 08/03/20 1342  Test: HGB Critical Value: 6.1  Name of Provider Notified: Tomi Bamberger, MD  Orders Received? Or Actions Taken?: acknowledged

## 2020-08-03 NOTE — ED Provider Notes (Signed)
[facility] EMERGENCY DEPARTMENT Provider Note   CSN: [id number] Arrival date & time: 08/03/20  1048     History Chief complaint: Low blood count  Beth Padilla is a 80 y.o. female.  HPI  Patient states she has a history of anemia.,  Congestive heart failure, atrial fibrillation as well as chronic kidney disease.  Patient was in the hospital on July 6 for congestive heart failure.  She was discharged on July 11.  Patient states she had an outpatient follow-up visit with her doctor yesterday.  She had laboratory tests.  She was called today and was told her hemoglobin was 6.9 and she should come to the hospital.  Patient denies any acute blood loss.  She has not noticed blood in her stool.  She has not been vomiting blood.  She states she does have a history of anemia requiring iron infusions in the past Past Medical History:  Diagnosis Date   Anemia    Atrial fibrillation ([place])    Depression    Diabetes mellitus    Hyperlipidemia 12/15/2010   Hypertension    Hypothyroidism 12/15/2010   Pneumonia    Renal disorder     Patient Active Problem List   Diagnosis Date Noted   Acute GI bleeding 08/03/2020   H/O: CVA (cerebrovascular accident)-- Has Afib 08/03/2020   CKD (chronic kidney disease), stage IV ([place]) 08/03/2020   Heme positive stool    Black stool    Abnormal CT of liver    Acute on chronic diastolic congestive heart failure ([place]) 48/18/5631   Acute diastolic CHF (congestive heart failure) ([place]) 07/22/2020   Abdominal pain 05/03/2019   Nausea & vomiting 05/03/2019   Depression 05/03/2019   Lobar pneumonia ([place]) 02/14/2018   Chronic diastolic HF (heart failure) ([place]) 02/13/2018   Anemia of chronic disease 02/13/2018   Uncontrolled type 2 diabetes mellitus with hyperglycemia ([place]) 02/13/2018   Acute respiratory failure with hypoxia ([place]) 02/13/2018   Paroxysmal atrial fibrillation ([place]) 02/19/2015   CVA (cerebral infarction)    Elevated troponin    Stroke ([place])  07/18/2014   Acute renal failure superimposed on stage 4 chronic kidney disease ([place]) 09/16/2013   CHF (congestive heart failure) ([place]) 09/14/2013   Bradycardia 09/14/2013   UTI (urinary tract infection) 09/14/2013   Type 2 diabetes with nephropathy ([place]) 12/15/2010   HTN (hypertension) 12/15/2010   Normocytic anemia 12/15/2010   Hypothyroidism 12/15/2010   Hyperlipidemia 12/15/2010    Past Surgical History:  Procedure Laterality Date   ABDOMINAL HYSTERECTOMY     EP IMPLANTABLE DEVICE N/A 07/21/2014   Procedure: Loop Recorder Insertion;  Surgeon: [name], MD;  Location: [facility];  Service: Cardiovascular;  Laterality: N/A;   implantable loop recorder removal  10/14/2018   MDT Reveal LINQ removed in office by Dr [name]   TEE WITHOUT CARDIOVERSION N/A 07/21/2014   Procedure: TRANSESOPHAGEAL ECHOCARDIOGRAM (TEE);  Surgeon: [name], MD;  Location: [facility] ENDOSCOPY;  Service: Cardiovascular;  Laterality: N/A;     OB History   No obstetric history on file.     Family History  Problem Relation Age of Onset   Stroke Mother    Cancer Sister     Social History   Tobacco Use   Smoking status: Never   Smokeless tobacco: Never  Substance Use Topics   Alcohol use: No   Drug use: No    Home Medications Prior to Admission medications   Medication Sig Start Date End Date Taking? Authorizing Provider  acetaminophen (TYLENOL) 650 MG CR tablet Take 650 mg by mouth every 8 (eight) hours as needed for pain.   Yes [provider]  amLODipine (NORVASC) 5 MG tablet Take 5 mg by mouth daily. 08/02/20  Yes [provider]  apixaban (ELIQUIS) 2.5 MG TABS tablet Take 2.5 mg by mouth 2 (two) times daily.   Yes [provider]  atorvastatin (LIPITOR) 80 MG tablet Take 1 tablet (80 mg total) by mouth every evening. 07/22/14  Yes [name], MD  busPIRone (BUSPAR) 10 MG tablet Take 10 mg by mouth 2 (two) times daily. 07/14/20  Yes [provider]   [employer] DICLOFENAC SODIUM 1 % GEL Apply 2 g topically 4 (four) times daily as needed (pain). 07/08/20  Yes [provider]  escitalopram (LEXAPRO) 10 MG tablet Take 10 mg by mouth daily.   Yes [provider]  ferrous sulfate 325 (65 FE) MG tablet Take 1 tablet by mouth 2 (two) times daily. 11/09/17  Yes [provider]  gabapentin (NEURONTIN) 300 MG capsule Take 1 capsule by mouth daily as needed (nerve pain). 01/03/18  Yes [provider]  hydrALAZINE (APRESOLINE) 50 MG tablet Take 1 tablet (50 mg total) by mouth 3 (three) times daily. 07/26/20  Yes Barton Dubois, MD  isosorbide dinitrate (ISORDIL) 30 MG tablet Take 30 mg by mouth 3 (three) times daily.  09/18/15  Yes [provider]  levothyroxine (SYNTHROID, LEVOTHROID) 100 MCG tablet Take 125 mcg by mouth daily before breakfast.   Yes [provider]  sodium bicarbonate 650 MG tablet Take 1,300 mg by mouth 2 (two) times daily.   Yes [provider]  valsartan (DIOVAN) 80 MG tablet Take 80 mg by mouth daily.   Yes [provider]  Vitamins A & D (VITAMIN A & D) ointment Apply 1 application topically as needed for dry skin. 11/09/17  Yes [provider]  acetaZOLAMIDE (DIAMOX) 250 MG tablet Take 1 tablet by mouth daily. Patient not taking: Reported on 08/03/2020 11/30/17   [provider]  amLODipine (NORVASC) 10 MG tablet Take 5 mg by mouth daily. Patient not taking: Reported on 08/03/2020 06/18/18   [provider]  furosemide (LASIX) 20 MG tablet Take 2 tablets (40 mg total) by mouth daily. Patient not taking: No sig reported 07/26/20   Barton Dubois, MD  omeprazole (PRILOSEC) 20 MG capsule Take 20 mg by mouth daily as needed (reflux). Patient not taking: Reported on 08/03/2020    [provider]  traMADol (ULTRAM) 50 MG tablet Take 50 mg by mouth daily as needed for moderate pain. Patient not taking: Reported on 08/03/2020 05/17/20   [provider]    Allergies    Ampicillin  Review of Systems   Review of Systems  All other systems reviewed and are negative.  Physical Exam Updated Vital Signs BP (!) 157/58 (BP Location: Right Arm)   Pulse 66   Temp 98.9 F (37.2 C) (Oral)   Resp 18   Ht 1.702 m (5\' 7" )   Wt 74.7 kg   SpO2 100%   BMI 25.79 kg/m   Physical Exam Vitals and nursing note reviewed.  Constitutional:      General: She is not in acute distress.    Appearance: She is well-developed.  HENT:     Head: Normocephalic and atraumatic.     Right Ear: External ear normal.     Left Ear: External ear normal.  Eyes:     General:  No scleral icterus.       Right eye: No discharge.        Left eye: No discharge.     Conjunctiva/sclera: Conjunctivae normal.  Neck:     Trachea: No tracheal deviation.  Cardiovascular:     Rate and Rhythm: Normal rate and regular rhythm.  Pulmonary:     Effort: Pulmonary effort is normal. No respiratory distress.     Breath sounds: Normal breath sounds. No stridor. No wheezing or rales.  Abdominal:     General: Bowel sounds are normal. There is no distension.     Palpations: Abdomen is soft.     Tenderness: There is no abdominal tenderness. There is no guarding or rebound.  Musculoskeletal:        General: No tenderness or deformity.     Cervical back: Neck supple.     Right lower leg: Edema present.     Left lower leg: Edema present.  Skin:    General: Skin is warm and dry.     Findings: No rash.  Neurological:     General: No focal deficit present.     Mental Status: She is alert.     Cranial Nerves: No cranial nerve deficit (no facial droop, extraocular movements intact, no slurred speech).     Sensory: No sensory deficit.     Motor: No abnormal muscle tone or seizure activity.     Coordination: Coordination normal.  Psychiatric:        Mood and Affect: Mood normal.    ED Results / Procedures / Treatments   Labs (all labs ordered are listed, but only  abnormal results are displayed) Labs Reviewed  CBC WITH DIFFERENTIAL/PLATELET - Abnormal; Notable for the following components:      Result Value   RBC 2.27 (*)    Hemoglobin 6.1 (*)    HCT 20.8 (*)    MCHC 29.3 (*)    RDW 20.5 (*)    All other components within normal limits  BASIC METABOLIC PANEL - Abnormal; Notable for the following components:   Sodium 134 (*)    Glucose, Bld 195 (*)    BUN 72 (*)    Creatinine, Ser 2.00 (*)    Calcium 8.1 (*)    GFR, Estimated 25 (*)    All other components within normal limits  PROTIME-INR - Abnormal; Notable for the following components:   Prothrombin Time 16.9 (*)    INR 1.4 (*)    All other components within normal limits  GLUCOSE, CAPILLARY - Abnormal; Notable for the following components:   Glucose-Capillary 225 (*)    All other components within normal limits  HEPATIC FUNCTION PANEL - Abnormal; Notable for the following components:   Albumin 2.6 (*)    All other components within normal limits  CBC - Abnormal; Notable for the following components:   RBC 2.52 (*)    Hemoglobin 7.1 (*)    HCT 22.1 (*)    RDW 18.8 (*)    All other components within normal limits  COMPREHENSIVE METABOLIC PANEL - Abnormal; Notable for the following components:   Glucose, Bld 190 (*)    BUN 74 (*)    Creatinine, Ser 1.98 (*)    Calcium 7.2 (*)    Total Protein 5.5 (*)    Albumin 2.3 (*)    GFR, Estimated 25 (*)    All other components within normal limits  GLUCOSE, CAPILLARY - Abnormal; Notable for the following components:   Glucose-Capillary 284 (*)  All other components within normal limits  GLUCOSE, CAPILLARY - Abnormal; Notable for the following components:   Glucose-Capillary 267 (*)    All other components within normal limits  GLUCOSE, CAPILLARY - Abnormal; Notable for the following components:   Glucose-Capillary 185 (*)    All other components within normal limits  GLUCOSE, CAPILLARY - Abnormal; Notable for the following  components:   Glucose-Capillary 170 (*)    All other components within normal limits  GLUCOSE, CAPILLARY - Abnormal; Notable for the following components:   Glucose-Capillary 197 (*)    All other components within normal limits  POC OCCULT BLOOD, ED - Abnormal; Notable for the following components:   Fecal Occult Bld POSITIVE (*)    All other components within normal limits  SARS CORONAVIRUS 2 (TAT 6-24 HRS)  LIPID PANEL  HEMOGLOBIN A1C  TYPE AND SCREEN  PREPARE RBC (CROSSMATCH)  PREPARE RBC (CROSSMATCH)    EKG None  Radiology No results found.  Procedures .Critical Care  Date/Time: 08/04/2020 8:27 AM Performed by: Dorie Rank, MD Authorized by: Dorie Rank, MD   Critical care provider statement:    Critical care time (minutes):  45   Critical care was time spent personally by me on the following activities:  Discussions with consultants, evaluation of patient's response to treatment, examination of patient, ordering and performing treatments and interventions, ordering and review of laboratory studies, ordering and review of radiographic studies, pulse oximetry, re-evaluation of patient's condition, obtaining history from patient or surrogate and review of old charts   Medications Ordered in ED Medications  pantoprazole (PROTONIX) injection 40 mg (40 mg Intravenous Given 08/03/20 1822)  octreotide (SANDOSTATIN) 2 mcg/mL load via infusion 50 mcg (50 mcg Intravenous Bolus from Bag 08/03/20 1722)    And  octreotide (SANDOSTATIN) 500 mcg in sodium chloride 0.9 % 250 mL (2 mcg/mL) infusion (50 mcg/hr Intravenous New Bag/Given 08/04/20 0128)  acetaZOLAMIDE (DIAMOX) tablet 250 mg (250 mg Oral Not Given 08/03/20 1843)  amLODipine (NORVASC) tablet 5 mg (5 mg Oral Given 08/03/20 1821)  atorvastatin (LIPITOR) tablet 80 mg (80 mg Oral Given 08/03/20 1820)  hydrALAZINE (APRESOLINE) tablet 50 mg (50 mg Oral Given 08/03/20 2238)  isosorbide dinitrate (ISORDIL) tablet 30 mg (30 mg Oral Given  08/03/20 2238)  busPIRone (BUSPAR) tablet 10 mg (10 mg Oral Given 08/03/20 2238)  escitalopram (LEXAPRO) tablet 10 mg (10 mg Oral Given 08/03/20 1821)  levothyroxine (SYNTHROID) tablet 100 mcg (100 mcg Oral Given 08/04/20 0531)  sodium bicarbonate tablet 1,300 mg (1,300 mg Oral Given 08/03/20 2238)  gabapentin (NEURONTIN) capsule 300 mg (has no administration in time range)  sodium chloride flush (NS) 0.9 % injection 3 mL (3 mLs Intravenous Given 08/03/20 2310)  sodium chloride flush (NS) 0.9 % injection 3 mL (3 mLs Intravenous Given 08/03/20 2310)  sodium chloride flush (NS) 0.9 % injection 3 mL (has no administration in time range)  0.9 %  sodium chloride infusion (has no administration in time range)  acetaminophen (TYLENOL) tablet 650 mg (has no administration in time range)    Or  acetaminophen (TYLENOL) suppository 650 mg (has no administration in time range)  traZODone (DESYREL) tablet 50 mg (has no administration in time range)  polyethylene glycol (MIRALAX / GLYCOLAX) packet 17 g (has no administration in time range)  bisacodyl (DULCOLAX) suppository 10 mg (has no administration in time range)  ondansetron (ZOFRAN) tablet 4 mg ( Oral See Alternative 08/03/20 2240)    Or  ondansetron (ZOFRAN) injection 4 mg (4 mg  Intravenous Not Given 08/03/20 2240)  multivitamin with minerals tablet 1 tablet (1 tablet Oral Given 08/03/20 1820)  cefTRIAXone (ROCEPHIN) 2 g in sodium chloride 0.9 % 100 mL IVPB (2 g Intravenous New Bag/Given 08/03/20 1820)  furosemide (LASIX) tablet 40 mg (40 mg Oral Given 08/03/20 1821)  insulin aspart (novoLOG) injection 0-6 Units (has no administration in time range)  insulin aspart (novoLOG) injection 0-5 Units (3 Units Subcutaneous Given 08/03/20 2239)  0.9 %  sodium chloride infusion (Manually program via Guardrails IV Fluids) (has no administration in time range)  furosemide (LASIX) injection 20 mg (has no administration in time range)  0.9 %  sodium chloride infusion (10  mL/hr Intravenous New Bag/Given 08/03/20 1817)  Darbepoetin Alfa (ARANESP) injection 100 mcg (100 mcg Subcutaneous Given 08/03/20 1726)  furosemide (LASIX) injection 40 mg (40 mg Intravenous Given 08/03/20 2239)    ED Course  I have reviewed the triage vital signs and the nursing notes.  Pertinent labs & imaging results that were available during my care of the patient were reviewed by me and considered in my medical decision making (see chart for details).  Clinical Course as of 08/04/20 0825  Tue Aug 03, 2020  1359 Patient's labs do show a hemoglobin of 6.1.  Patient is also fecal occult positive [JK]  1359 I have ordered blood transfusions. [JK]    Clinical Course User Index [JK] Dorie Rank, MD   MDM Rules/Calculators/A&P                          Patient presented to the ED for evaluation of low hemoglobin.  Patient had outpatient laboratory tests that showed severe anemia and she was sent to the ED.  Patient initially denied any blood in her stool or vomiting of blood.  Hemoccult was performed and the patient was guaiac positive.  Patient appears to be having a GI bleed, possible upper.  Started on IV Protonix.  GI consult placed.  Blood transfusions ordered.  Admit to the hospital for further treatment.  Patient remained hemodynamically stable. Final Clinical Impression(s) / ED Diagnoses Final diagnoses:  Gastrointestinal hemorrhage, unspecified gastrointestinal hemorrhage type     Dorie Rank, MD 08/04/20 [phone]

## 2020-08-04 ENCOUNTER — Inpatient Hospital Stay (HOSPITAL_COMMUNITY): Payer: Medicare Other

## 2020-08-04 DIAGNOSIS — K922 Gastrointestinal hemorrhage, unspecified: Secondary | ICD-10-CM | POA: Diagnosis not present

## 2020-08-04 DIAGNOSIS — R932 Abnormal findings on diagnostic imaging of liver and biliary tract: Secondary | ICD-10-CM

## 2020-08-04 DIAGNOSIS — K921 Melena: Secondary | ICD-10-CM

## 2020-08-04 LAB — GLUCOSE, CAPILLARY
Glucose-Capillary: 185 mg/dL — ABNORMAL HIGH (ref 70–99)
Glucose-Capillary: 170 mg/dL — ABNORMAL HIGH (ref 70–99)
Glucose-Capillary: 197 mg/dL — ABNORMAL HIGH (ref 70–99)
Glucose-Capillary: 240 mg/dL — ABNORMAL HIGH (ref 70–99)
Glucose-Capillary: 177 mg/dL — ABNORMAL HIGH (ref 70–99)
Glucose-Capillary: 219 mg/dL — ABNORMAL HIGH (ref 70–99)
Glucose-Capillary: 197 mg/dL — ABNORMAL HIGH (ref 70–99)

## 2020-08-04 LAB — CBC
HCT: 22.1 % — ABNORMAL LOW (ref 36.0–46.0)
Hemoglobin: 7.1 g/dL — ABNORMAL LOW (ref 12.0–15.0)
MCH: 28.2 pg (ref 26.0–34.0)
MCHC: 32.1 g/dL (ref 30.0–36.0)
MCV: 87.7 fL (ref 80.0–100.0)
Platelets: 244 10*3/uL (ref 150–400)
RBC: 2.52 MIL/uL — ABNORMAL LOW (ref 3.87–5.11)
RDW: 18.8 % — ABNORMAL HIGH (ref 11.5–15.5)
WBC: 8 10*3/uL (ref 4.0–10.5)
nRBC: 0 % (ref 0.0–0.2)

## 2020-08-04 LAB — COMPREHENSIVE METABOLIC PANEL
ALT: 15 U/L (ref 0–44)
AST: 15 U/L (ref 15–41)
Albumin: 2.3 g/dL — ABNORMAL LOW (ref 3.5–5.0)
Alkaline Phosphatase: 38 U/L (ref 38–126)
Anion gap: 6 (ref 5–15)
BUN: 74 mg/dL — ABNORMAL HIGH (ref 8–23)
CO2: 23 mmol/L (ref 22–32)
Calcium: 7.2 mg/dL — ABNORMAL LOW (ref 8.9–10.3)
Chloride: 106 mmol/L (ref 98–111)
Creatinine, Ser: 1.98 mg/dL — ABNORMAL HIGH (ref 0.44–1.00)
GFR, Estimated: 25 mL/min — ABNORMAL LOW (ref >60)
Glucose, Bld: 190 mg/dL — ABNORMAL HIGH (ref 70–99)
Potassium: 4.7 mmol/L (ref 3.5–5.1)
Sodium: 135 mmol/L (ref 135–145)
Total Bilirubin: 0.7 mg/dL (ref 0.3–1.2)
Total Protein: 5.5 g/dL — ABNORMAL LOW (ref 6.5–8.1)

## 2020-08-04 LAB — LIPID PANEL
Cholesterol: 94 mg/dL (ref 0–200)
HDL: 51 mg/dL (ref >40)
LDL Cholesterol: 38 mg/dL (ref 0–99)
Total CHOL/HDL Ratio: 1.8 RATIO
Triglycerides: 25 mg/dL (ref <150)
VLDL: 5 mg/dL (ref 0–40)

## 2020-08-04 LAB — PREPARE RBC (CROSSMATCH)

## 2020-08-04 LAB — HEMOGLOBIN AND HEMATOCRIT, BLOOD
HCT: 28 % — ABNORMAL LOW (ref 36.0–46.0)
Hemoglobin: 9.3 g/dL — ABNORMAL LOW (ref 12.0–15.0)

## 2020-08-04 LAB — SARS CORONAVIRUS 2 (TAT 6-24 HRS): SARS Coronavirus 2: NEGATIVE

## 2020-08-04 LAB — HEMOGLOBIN A1C
Hgb A1c MFr Bld: 6.3 % — ABNORMAL HIGH (ref 4.8–5.6)
Mean Plasma Glucose: 134.11 mg/dL

## 2020-08-04 MED ORDER — FUROSEMIDE 10 MG/ML IJ SOLN
20.0000 mg | Freq: Once | INTRAMUSCULAR | Status: AC
  Administered 2020-08-04: 20 mg via INTRAVENOUS
  Filled 2020-08-04: qty 2

## 2020-08-04 MED ORDER — SODIUM CHLORIDE 0.9% IV SOLUTION: Freq: Once | INTRAVENOUS | Status: AC

## 2020-08-04 NOTE — Progress Notes (Signed)
Progress note:  Beth Padilla, is a 80 y.o. female   MRN: [id number]    DOB - 1940-01-18  Admit Date - 08/03/2020  Outpatient Primary MD for the patient is Vasireddy, Lanetta Inch, MD    Subjective:   The patient was seen and examined, remained stable this morning. The nursing and the patient reports another episodes of dark bloody stool this morning.    Hospital course:    Beth Padilla  is a 80 y.o. 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 Cirrhosis on CT April 2021, dCHF who presents to the ED with a hemoglobin of 6.1 after being told to come here by PCP due to low hemoglobin on CBC that was drawn on her post-hospitalization checkup -Patient complains of fatigue malaise poor appetite and occasional dizzy spells No syncope, no chest pains no shortness of breath Patient was recently treated for CHF at this hospital discharge on 07/26/2020 with hemoglobin of 7.5 at the time -She missed a dose of her Procrit and since discharge from the hospital on 07/26/2020 she has not been taking iron supplementation -Hemoccult is positive in the ED -Last dose of Eliquis was p.m. of 08/02/2020 -INR is 1.4, Hgb 6.1, platelets 344 -Creatinine is 2.0 which is close to patient's baseline BUN is 72 with a GFR of 25 which is close to patient's baseline, bicarb is 22 and anion gap is not elevated    Assessment & Plan:    Principal Problem:   Acute GI bleeding Active Problems:   Type 2 diabetes with nephropathy (HCC)   HTN (hypertension)   Hypothyroidism   Paroxysmal atrial fibrillation (HCC)   Chronic diastolic HF (heart failure) ([place])    Uncontrolled type 2 diabetes mellitus with hyperglycemia (HCC)   H/O: CVA (cerebrovascular accident)-- Has Afib   CKD (chronic kidney disease), stage IV (HCC)   Heme positive stool   Black stool   Abnormal CT of liver    1) acute on chronic anemia most likely due to ABLA--dark stools + heme positive -Monitoring H&H -on 07/26/2020 Hgb was 7.5 , on admission Hgb 6.1>>> s/p 2U PRBC transfusion,Hgb 7.1, still symptomatic another 2 units will be transfused today 08/04/2020  -She missed a dose of her Procrit and since discharge from the hospital on 07/26/2020 she has not been taking iron supplementation -Holding Eliquis last dose 08/02/2020  -GI service would like to wait until the a.m. of Thursday 08/05/2020 to do EGD due to recent Eliquis administration -We will continue IV Protonix, IV octreotide added by GI ?  Possible liver cirrhosis   2)PAFIb--history of prior stroke in 2016- -Very high risk for another stroke  CHA2DS2- VASc score   is =  8 (age x 2, h/o CVA  x2, CHF x1, HTN x 1, gender x 1, DM x 1)  Which is  equal to = 11.2 % annual risk of stroke  -We need to restart Eliquis as soon as it is feasible pending endoscopic findings -Stable  3) CKD stage - IV --Creatinine is 2.0 which is close to patient's baseline BUN is 72, GFR is 25 -Creatinine 2.0, 1.98, -Renal function appears to be close to her baseline,  -renally adjust medications, avoid nephrotoxic agents / dehydration  / hypotension -Stable  4)DM 2- -A1c 6.8 reflecting good diabetic control PTA -Avoid over aggressive control at this time as patient is to be n.p.o. for EGD -We will check CBG nightly ACH S, SSI coverage  5)HFpEF--history of diastolic CHF  \-appears stable at this time EF on echo from 07/22/2020 was 55 to 60% monitor volume status closely with transfusions of PRBC, recently afterlife for acute on chronic diastolic CHF exacerbation  6) anxiety and depression--- stable, we will, continue Lexapro and  buspirone  7) hypothyroidism--- stable continue levothyroxine 100 mcg daily  8) possible liver cirrhosis--- prior CT abdomen and pelvis suggested cirrhosis of the liver -Abdominal ultrasound with Elastoplasty ordered and pending -Avoid hepatotoxic agent -Rocephin for SBP prophylaxis suggested by GI service -Octreotide as above #1  9)HTN  -Monitoring, remained stable -Resuming home medication of  amlodipine 5 mg daily, hydralazine 50 mg 3 times daily, isosorbide 30 mg daily  10) history of prior stroke--- in the setting of A. fib, please see #2 above -Continue Lipitor, Eliquis on hold as above #2  Disposition/Need for in-Hospital Stay- patient unable to be discharged at this time due to -acute on chronic anemia-due to acute GI bleed requiring transfusion of PRBC and endoluminal evaluation to determine when Eliquis can be restarted*  Status is: Inpatient  Remains inpatient appropriate because: Please see disposition above  Dispo: The patient is from: Home              Anticipated d/c is to: Home              Anticipated d/c date is: 2 days              Patient currently is not medically stable to d/c. Barriers: Not Clinically Stable-     Medications:    acetaZOLAMIDE  250 mg Oral Daily   amLODipine  5 mg Oral Daily   atorvastatin  80 mg Oral QPM   busPIRone  10 mg Oral BID   escitalopram  10 mg Oral Daily   furosemide  20 mg Intravenous Once   furosemide  40 mg Oral Daily   hydrALAZINE  50 mg Oral TID   insulin aspart  0-5 Units Subcutaneous QHS   insulin aspart  0-6 Units Subcutaneous TID WC   isosorbide dinitrate  30 mg Oral TID   levothyroxine  100 mcg Oral QAC breakfast   multivitamin with minerals  1 tablet Oral Daily   pantoprazole (PROTONIX) IV  40 mg Intravenous Q12H   sodium bicarbonate  1,300 mg Oral BID   sodium chloride flush  3 mL Intravenous Q12H   sodium chloride flush  3 mL Intravenous Q12H      Allergies:     Allergies  Allergen Reactions    Ampicillin Rash     Physical Exam:   Vitals  Blood pressure (!) 158/60, pulse 63, temperature 98.6 F (37 C), temperature source Oral, resp. rate 16, height 5\' 7"  (1.702 m), weight 74.7 kg, SpO2 100 %.     Physical Exam:   General:  Alert, oriented, cooperative, no distress;  HEENT:  Normocephalic, PERRL, otherwise with in Normal limits   Neuro:  CNII-XII intact. , normal motor and sensation, reflexes intact   Lungs:   Clear to auscultation BL, Respirations unlabored, no wheezes / crackles  Cardio:    S1/S2, RRR, No murmure, No Rubs or Gallops   Abdomen:   Soft, non-tender, bowel sounds active all four quadrants,  no guarding or peritoneal signs.  Muscular skeletal:  Moderate generalized weaknesses  Limited exam - in bed, able to move all 4 extremities,  2+ pulses,  symmetric, No pitting edema  Skin:  Dry, warm to touch, negative for any Rashes,  Wounds: Please see nursing documentation           Data Review:    CBC Recent Labs  Lab 08/03/20 1321 08/04/20 0647  WBC 8.1 8.0  HGB 6.1* 7.1*  HCT 20.8* 22.1*  PLT 344 244  MCV 91.6 87.7  MCH 26.9 28.2  MCHC 29.3* 32.1  RDW 20.5* 18.8*  LYMPHSABS 0.7  --   MONOABS 0.4  --   EOSABS 0.0  --   BASOSABS 0.0  --    ------------------------------------------------------------------------------------------------------------------  Chemistries  Recent Labs  Lab 08/03/20 1321 08/03/20 1718 08/04/20 0647  NA 134*  --  135  K 4.7  --  4.7  CL 103  --  106  CO2 22  --  23  GLUCOSE 195*  --  190*  BUN 72*  --  74*  CREATININE 2.00*  --  1.98*  CALCIUM 8.1*  --  7.2*  AST  --  21 15  ALT  --  16 15  ALKPHOS  --  44 38  BILITOT  --  0.4 0.7   ------------------------------------------------------------------------------------------------------------------ estimated creatinine clearance is 23.5 mL/min (A) (by C-G formula based on SCr of 1.98 mg/dL  (H)). ------------------------------------------------------------------------------------------------------------------ No results for input(s): TSH, T4TOTAL, T3FREE, THYROIDAB in the last 72 hours.  Invalid input(s): FREET3   Coagulation profile Recent Labs  Lab 08/03/20 1321  INR 1.4*   ------------------------------------------------------------------------------------------------------------------- No results for input(s): DDIMER in the last 72 hours. -------------------------------------------------------------------------------------------------------------------  Cardiac Enzymes No results for input(s): CKMB, TROPONINI, MYOGLOBIN in the last 168 hours.  Invalid input(s): CK ------------------------------------------------------------------------------------------------------------------    Component Value Date/Time   BNP 1,321.0 (H) 07/21/2020 2258    Urinalysis    Component Value Date/Time   COLORURINE STRAW (A) 02/13/2018 1705   APPEARANCEUR CLEAR 02/13/2018 1705   LABSPEC 1.006 02/13/2018 1705   PHURINE 7.0 02/13/2018 1705   GLUCOSEU NEGATIVE 02/13/2018 1705   HGBUR NEGATIVE 02/13/2018 1705   [place] 02/13/2018 [place] 02/13/2018 1705   PROTEINUR 30 (A) 02/13/2018 1705   UROBILINOGEN 0.2 07/18/2014 2120   NITRITE NEGATIVE 02/13/2018 1705   LEUKOCYTESUR SMALL (A) 02/13/2018 1705     Imaging Results:    US ABDOMEN COMPLETE W/ELASTOGRAPHY  Result Date: 08/04/2020 CLINICAL DATA:  Abnormal liver function tests. EXAM: ULTRASOUND ABDOMEN ULTRASOUND HEPATIC ELASTOGRAPHY TECHNIQUE: Sonography of the upper abdomen was performed. In addition, ultrasound elastography evaluation of the liver was performed. A region of interest was placed within the right lobe of the liver. Following application of a compressive sonographic pulse, tissue compressibility was assessed. Multiple assessments were performed at the selected site. Median tissue  compressibility was determined. Previously, hepatic stiffness was assessed by shear wave velocity. Based on recently published Society of Radiologists in Ultrasound consensus article, reporting is now recommended to be performed in the SI units of pressure (kiloPascals) representing hepatic stiffness/elasticity.  The obtained result is compared to the published reference standards. (cACLD = compensated Advanced Chronic Liver Disease) COMPARISON:  CT abdomen 05/03/2019 FINDINGS: ULTRASOUND ABDOMEN Gallbladder: Gallstones measuring up to 1.2 cm in diameter. Sonographic Murphy's sign absent. No significant gallbladder wall thickening. Common bile duct: Diameter: 0.3 cm Liver: Faintly accentuated echogenicity in the liver without definite nodular contours. No focal hepatic lesion identified. Portal vein is patent on color Doppler imaging with normal direction of blood flow towards the liver. IVC: No abnormality visualized. Pancreas: Poor visualization of the pancreatic head and uncinate process, questionable heterogeneity in this region. Spleen: Size and appearance within normal limits. Right Kidney: Length: 9.8 cm. Echogenicity within normal limits. No solid mass or hydronephrosis visualized. Right renal cysts include a 2.0 by 1.8 by 2.0 cm cyst and a 2.0 by 1.7 by 1.9 cm lower pole cyst, both appear simple. Left Kidney: Length: 8.5 cm. Echogenicity within normal limits. No solid mass or hydronephrosis visualized. Left renal cysts include a 1.0 by 1.1 by 0.9 cm cyst and a separate 1.0 by 0.7 by 0.8 cm cyst, both with reasonably simple characteristics. Abdominal aorta: No aneurysm visualized. Other findings: None. ULTRASOUND HEPATIC ELASTOGRAPHY Device: Siemens Helix VTQ Patient position: Supine Transducer 5C1 Number of measurements: 10 Hepatic segment:  8 Median kPa: 7.2 IQR: 0.8 IQR/Median kPa ratio: 0.1 Data quality:  Good Diagnostic category: < or = 9 kPa: in the absence of other known clinical signs, rules out cACLD  The use of hepatic elastography is applicable to patients with viral hepatitis and non-alcoholic fatty liver disease. At this time, there is insufficient data for the referenced cut-off values and use in other causes of liver disease, including alcoholic liver disease. Patients, however, may be assessed by elastography and serve as their own reference standard/baseline. In patients with non-alcoholic liver disease, the values suggesting compensated advanced chronic liver disease (cACLD) may be lower, and patients may need additional testing with elasticity results of 7-9 kPa. Please note that abnormal hepatic elasticity and shear wave velocities may also be identified in clinical settings other than with hepatic fibrosis, such as: acute hepatitis, elevated right heart and central venous pressures including use of beta blockers, veno-occlusive disease (Budd-Chiari), infiltrative processes such as mastocytosis/amyloidosis/infiltrative tumor/lymphoma, extrahepatic cholestasis, with hyperemia in the post-prandial state, and with liver transplantation. Correlation with patient history, laboratory data, and clinical condition recommended. Diagnostic Categories: < or =5 kPa: high probability of being normal < or =9 kPa: in the absence of other known clinical signs, rules out cACLD >9 kPa and ?13 kPa: suggestive of cACLD, but needs further testing >13 kPa: highly suggestive of cACLD > or =17 kPa: highly suggestive of cACLD with an increased probability of clinically significant portal hypertension IMPRESSION: ULTRASOUND ABDOMEN: 1. Cholelithiasis without gallbladder wall thickening or sonographic Murphy's sign. 2. Bilateral renal cysts. 3. Poor visualization of the pancreatic head and uncinate process with questionable heterogeneity in this region. Consider pancreatic protocol CT or MRI for further characterization. ULTRASOUND HEPATIC ELASTOGRAPHY: Median kPa:  7.2 Diagnostic category: < or = 9 kPa: in the absence of other  known clinical signs, rules out cACLD Electronically Signed   By: Van Clines M.D.   On: 08/04/2020 11:24    Radiological Exams on Admission: US ABDOMEN COMPLETE W/ELASTOGRAPHY  Result Date: 08/04/2020 CLINICAL DATA:  Abnormal liver function tests. EXAM: ULTRASOUND ABDOMEN ULTRASOUND HEPATIC ELASTOGRAPHY TECHNIQUE: Sonography of the upper abdomen was performed. In addition, ultrasound elastography evaluation of the liver was performed. A region of interest was placed  within the right lobe of the liver. Following application of a compressive sonographic pulse, tissue compressibility was assessed. Multiple assessments were performed at the selected site. Median tissue compressibility was determined. Previously, hepatic stiffness was assessed by shear wave velocity. Based on recently published Society of Radiologists in Ultrasound consensus article, reporting is now recommended to be performed in the SI units of pressure (kiloPascals) representing hepatic stiffness/elasticity. The obtained result is compared to the published reference standards. (cACLD = compensated Advanced Chronic Liver Disease) COMPARISON:  CT abdomen 05/03/2019 FINDINGS: ULTRASOUND ABDOMEN Gallbladder: Gallstones measuring up to 1.2 cm in diameter. Sonographic Murphy's sign absent. No significant gallbladder wall thickening. Common bile duct: Diameter: 0.3 cm Liver: Faintly accentuated echogenicity in the liver without definite nodular contours. No focal hepatic lesion identified. Portal vein is patent on color Doppler imaging with normal direction of blood flow towards the liver. IVC: No abnormality visualized. Pancreas: Poor visualization of the pancreatic head and uncinate process, questionable heterogeneity in this region. Spleen: Size and appearance within normal limits. Right Kidney: Length: 9.8 cm. Echogenicity within normal limits. No solid mass or hydronephrosis visualized. Right renal cysts include a 2.0 by 1.8 by 2.0 cm cyst  and a 2.0 by 1.7 by 1.9 cm lower pole cyst, both appear simple. Left Kidney: Length: 8.5 cm. Echogenicity within normal limits. No solid mass or hydronephrosis visualized. Left renal cysts include a 1.0 by 1.1 by 0.9 cm cyst and a separate 1.0 by 0.7 by 0.8 cm cyst, both with reasonably simple characteristics. Abdominal aorta: No aneurysm visualized. Other findings: None. ULTRASOUND HEPATIC ELASTOGRAPHY Device: Siemens Helix VTQ Patient position: Supine Transducer 5C1 Number of measurements: 10 Hepatic segment:  8 Median kPa: 7.2 IQR: 0.8 IQR/Median kPa ratio: 0.1 Data quality:  Good Diagnostic category: < or = 9 kPa: in the absence of other known clinical signs, rules out cACLD The use of hepatic elastography is applicable to patients with viral hepatitis and non-alcoholic fatty liver disease. At this time, there is insufficient data for the referenced cut-off values and use in other causes of liver disease, including alcoholic liver disease. Patients, however, may be assessed by elastography and serve as their own reference standard/baseline. In patients with non-alcoholic liver disease, the values suggesting compensated advanced chronic liver disease (cACLD) may be lower, and patients may need additional testing with elasticity results of 7-9 kPa. Please note that abnormal hepatic elasticity and shear wave velocities may also be identified in clinical settings other than with hepatic fibrosis, such as: acute hepatitis, elevated right heart and central venous pressures including use of beta blockers, veno-occlusive disease (Budd-Chiari), infiltrative processes such as mastocytosis/amyloidosis/infiltrative tumor/lymphoma, extrahepatic cholestasis, with hyperemia in the post-prandial state, and with liver transplantation. Correlation with patient history, laboratory data, and clinical condition recommended. Diagnostic Categories: < or =5 kPa: high probability of being normal < or =9 kPa: in the absence of other  known clinical signs, rules out cACLD >9 kPa and ?13 kPa: suggestive of cACLD, but needs further testing >13 kPa: highly suggestive of cACLD > or =17 kPa: highly suggestive of cACLD with an increased probability of clinically significant portal hypertension IMPRESSION: ULTRASOUND ABDOMEN: 1. Cholelithiasis without gallbladder wall thickening or sonographic Murphy's sign. 2. Bilateral renal cysts. 3. Poor visualization of the pancreatic head and uncinate process with questionable heterogeneity in this region. Consider pancreatic protocol CT or MRI for further characterization. ULTRASOUND HEPATIC ELASTOGRAPHY: Median kPa:  7.2 Diagnostic category: < or = 9 kPa: in the absence of other known clinical signs, rules  out cACLD Electronically Signed   By: Van Clines M.D.   On: 08/04/2020 11:24    DVT Prophylaxis -SCD/Gi Bleed  AM Labs Ordered, also please review Full Orders  Family Communication: Admission, patients condition and plan of care including tests being ordered have been discussed with the patient  who indicate understanding and agree with the plan   Code Status - Full Code  Likely DC to after endoluminal evaluation  Condition   stable  Deatra James M.D on 08/04/2020 at 11:56 AM Go to [email or web address] -  for contact info  [facility] - Office  [phone]

## 2020-08-04 NOTE — Progress Notes (Signed)
Subjective:  Had a black/bloody stool this morning. Denies abdominal pain. No N/V.   Objective: Vital signs in last 24 hours: Temp:  [98 F (36.7 C)-99.5 F (37.5 C)] 98.9 F (37.2 C) (07/20 0430) Pulse Rate:  [66-82] 66 (07/20 0430) Resp:  [16-20] 18 (07/20 0430) BP: (150-188)/(52-93) 157/58 (07/20 0430) SpO2:  [96 %-100 %] 100 % (07/20 0430) Weight:  [74.7 kg] 74.7 kg (07/19 1602)   General:   Alert,  Well-developed, well-nourished, pleasant and cooperative in NAD Head:  Normocephalic and atraumatic. Eyes:  Sclera clear, no icterus.  Abdomen:  Soft, nontender and nondistended.    Extremities:  Without clubbing, deformity or edema. Neurologic:  Alert and  oriented x4;  grossly normal neurologically. Psych:  Alert and cooperative. Normal mood and affect.  Intake/Output from previous day: 07/19 0701 - 07/20 0700 In: 1262.4 [P.O.:600; I.V.:222.8; Blood:339.6; IV Piggyback:100] Out: -  Intake/Output this shift: Total I/O In: 600 [P.O.:600] Out: -   Lab Results: CBC Recent Labs    08/03/20 1321 08/04/20 0647  WBC 8.1 8.0  HGB 6.1* 7.1*  HCT 20.8* 22.1*  MCV 91.6 87.7  PLT 344 244   BMET Recent Labs    08/03/20 1321 08/04/20 0647  NA 134* 135  K 4.7 4.7  CL 103 106  CO2 22 23  GLUCOSE 195* 190*  BUN 72* 74*  CREATININE 2.00* 1.98*  CALCIUM 8.1* 7.2*   LFTs Recent Labs    08/03/20 1718 08/04/20 0647  BILITOT 0.4 0.7  BILIDIR 0.1  --   IBILI 0.3  --   ALKPHOS 44 38  AST 21 15  ALT 16 15  PROT 6.5 5.5*  ALBUMIN 2.6* 2.3*   No results for input(s): LIPASE in the last 72 hours. PT/INR Recent Labs    08/03/20 1321  LABPROT 16.9*  INR 1.4*      Imaging Studies: DG Chest 2 View  Result Date: 07/24/2020 CLINICAL DATA:  Shortness of breath and coughing EXAM: CHEST - 2 VIEW COMPARISON:  07/21/2020 FINDINGS: Patchy bilateral airspace opacity, again favoring pneumonia. Stable cardiomegaly. No visible effusion or pneumothorax. IMPRESSION: Unchanged  bilateral pulmonary infiltrates. Electronically Signed   By: Monte Fantasia M.D.   On: 07/24/2020 11:21   DG Chest 2 View  Result Date: 07/21/2020 CLINICAL DATA:  Shortness of breath, COVID positive 06/14/2020 EXAM: CHEST - 2 VIEW COMPARISON:  06/18/2020 FINDINGS: Patchy right lower lobe opacity, progressive from the prior, suspicious for pneumonia. Mild left basilar opacity, similar to the prior, atelectasis versus pneumonia. No pleural effusion or pneumothorax. Cardiomegaly.  Thoracic aortic atherosclerosis. IMPRESSION: Progressive right lower lobe opacity, suspicious for pneumonia. Mild left basilar opacity, atelectasis versus pneumonia. Electronically Signed   By: Julian Hy M.D.   On: 07/21/2020 19:55   ECHOCARDIOGRAM COMPLETE  Result Date: 07/22/2020    ECHOCARDIOGRAM REPORT   Patient Name:   Beth Padilla Date of Exam: 07/22/2020 Medical Rec #:  [id number]          Height:       67.0 in Accession #:    [id number]         Weight:       167.0 lb Date of Birth:  [birth year]          BSA:          1.873 m Patient Age:    31 years           BP:           204/68  mmHg Patient Gender: F                  HR:           88 bpm. Exam Location:  [facility] Procedure: 2D Echo, Cardiac Doppler and Color Doppler Indications:    CHF Acute Diastolic  History:        Patient has prior history of Echocardiogram examinations, most                 recent 02/14/2018. CHF, Stroke, Arrythmias:Atrial Fibrillation;                 Risk Factors:Hypertension and Diabetes.  Sonographer:    Wenda Low Referring Phys: [id number] [place]  1. Left ventricular ejection fraction, by estimation, is 55 to 60%. The left ventricle has normal function. The left ventricle has no regional wall motion abnormalities. There is mild left ventricular hypertrophy. Left ventricular diastolic parameters are indeterminate.  2. Right ventricular systolic function is mildly reduced. The right ventricular size is mildly  enlarged. Mildly increased right ventricular wall thickness.  3. Left atrial size was moderately dilated.  4. Right atrial size was mildly dilated.  5. Trivial mitral valve regurgitation.  6. AV is thickened, calcified with restricted motion. Peak and mean pressures through the valve are 37 and 20 mm Hg resepctively AVA (VTI) is 1.2 cm2. Dimentionless index is 0.39 consistent with moderate aortic stenosis. Since echo from Jan 2020 there is no significant change.. Aortic valve regurgitation is mild.  7. The inferior vena cava is dilated in size with <50% respiratory variability, suggesting right atrial pressure of 15 mmHg. FINDINGS  Left Ventricle: Left ventricular ejection fraction, by estimation, is 55 to 60%. The left ventricle has normal function. The left ventricle has no regional wall motion abnormalities. The left ventricular internal cavity size was normal in size. There is  mild left ventricular hypertrophy. Left ventricular diastolic parameters are indeterminate. Right Ventricle: The right ventricular size is mildly enlarged. Mildly increased right ventricular wall thickness. Right ventricular systolic function is mildly reduced. Left Atrium: Left atrial size was moderately dilated. Right Atrium: Right atrial size was mildly dilated. Pericardium: Trivial pericardial effusion is present. Mitral Valve: There is mild thickening of the mitral valve leaflet(s). Mild mitral annular calcification. Trivial mitral valve regurgitation. MV peak gradient, 13.5 mmHg. The mean mitral valve gradient is 3.0 mmHg. Tricuspid Valve: The tricuspid valve is normal in structure. Tricuspid valve regurgitation is mild. Aortic Valve: AV is thickened, calcified with restricted motion. Peak and mean pressures through the valve are 37 and 20 mm Hg resepctively AVA (VTI) is 1.2 cm2. Dimentionless index is 0.39 consistent with moderate aortic stenosis. Since echo from Jan 2020 there is no significant change. Aortic valve regurgitation  is mild. Aortic regurgitation PHT measures 379 msec. Aortic valve mean gradient measures 19.5 mmHg. Aortic valve peak gradient measures 37.0 mmHg. Aortic valve area, by VTI measures 1.12 cm. Pulmonic Valve: The pulmonic valve was grossly normal. Pulmonic valve regurgitation is trivial. Aorta: The aortic root and ascending aorta are structurally normal, with no evidence of dilitation. Venous: The inferior vena cava is dilated in size with less than 50% respiratory variability, suggesting right atrial pressure of 15 mmHg. IAS/Shunts: No atrial level shunt detected by color flow Doppler.  LEFT VENTRICLE PLAX 2D LVIDd:         4.26 cm  Diastology LVIDs:         3.07 cm  LV e' medial:  8.19 cm/s LV PW:         1.48 cm  LV E/e' medial:  19.3 LV IVS:        0.98 cm  LV e' lateral:   9.89 cm/s LVOT diam:     1.90 cm  LV E/e' lateral: 16.0 LV SV:         86 LV SV Index:   46 LVOT Area:     2.84 cm  RIGHT VENTRICLE RV Basal diam:  5.21 cm RV Mid diam:    3.21 cm RV S prime:     9.81 cm/s TAPSE (M-mode): 2.8 cm LEFT ATRIUM              Index       RIGHT ATRIUM           Index LA diam:        5.40 cm  2.88 cm/m  RA Area:     20.70 cm LA Vol (A2C):   118.0 ml 63.00 ml/m RA Volume:   66.60 ml  35.56 ml/m LA Vol (A4C):   81.7 ml  43.62 ml/m LA Biplane Vol: 105.0 ml 56.06 ml/m  AORTIC VALVE AV Area (Vmax):    1.18 cm AV Area (Vmean):   1.18 cm AV Area (VTI):     1.12 cm AV Vmax:           304.00 cm/s AV Vmean:          202.500 cm/s AV VTI:            0.765 m AV Peak Grad:      37.0 mmHg AV Mean Grad:      19.5 mmHg LVOT Vmax:         127.00 cm/s LVOT Vmean:        84.400 cm/s LVOT VTI:          0.302 m LVOT/AV VTI ratio: 0.39 AI PHT:            379 msec  AORTA Ao Root diam: 3.30 cm Ao Asc diam:  3.80 cm MITRAL VALVE                TRICUSPID VALVE MV Area (PHT): 3.83 cm     TR Peak grad:   38.4 mmHg MV Area VTI:   1.97 cm     TR Vmax:        310.00 cm/s MV Peak grad:  13.5 mmHg MV Mean grad:  3.0 mmHg     SHUNTS MV  Vmax:       1.84 m/s     Systemic VTI:  0.30 m MV Vmean:      71.7 cm/s    Systemic Diam: 1.90 cm MV Decel Time: 198 msec MV E velocity: 158.00 cm/s MV A velocity: 64.00 cm/s MV E/A ratio:  2.47 Dorris Carnes MD Electronically signed by Dorris Carnes MD Signature Date/Time: 07/22/2020/5:33:45 PM    Final   [2 weeks]   Assessment: 80 y/o female with h/o chronic anemia, paroxysmal Afib on Eliquis, moderate aortic stenosis, stroke, DM, HTN, CKD, hypothyroidism, noted nodular liver on CT 04/2019, recent hospitalization D/Cd 7/11 for acute on chronic CHF. Received recent unit of prbcs and IV iron during last hospitalization. Presented to ED yesterday at request of PCP for for low Hgb.  Acute on chronic anemia/melena: Presented with Hgb 6.1 (down from 7.5 on 07/26/20). H/o chronic anemia with Hgb in 8-9 range, in setting of CKD, chronic blood loss, receiving IV iron  and Epogen with nephrology. Likely multifactorial with anemia of chronic disease and element of IDA given low normal ferritin/iron/fe sat. Required prbcs and iron earlier this month when Hgb drop to low 6 range. Patient reports no Epogen in 2 months.  Patient had noted black stool on oral iron but cleared up when stopping. Nursing staff in ED, reported stools black/heme +. Patient has been on PPI for several weeks. Denies NSAIDs or etoh. Prior EGD/colonoscopy in [place] "norrmal" per patient.  Hemoglobin up to 7.1 this morning after receiving 2 units of packed red blood cells.  Third unit ordered.  CT imagine April 2021 with suggestive of nodular liver.  Patient has had no follow-up.  Spleen was normal at that time.  Platelet counts have been normal.  Need to consider possibility of esophageal variceal bleed or oozing from portal hypertensive gastropathy.  Patient is on octreotide and receiving Cipro for SBP prophylaxis given allergy to ampicillin.  Ultrasound with elastography this morning showing cholelithiasis, bilateral renal cyst, poorly visualized  pancreatic head and uncinate process with questionable heterogeneity in this region.  Consider pancreatic protocol CT or MRI for further characterization.  Liver faintly as simulated echogenicity without definite nodular contours.  Spleen normal in size.  Median kPa of 7.2 (between 7-9 needs further evaluation).   Coagulopathy: Last dose of Eliquis evening of July 18.  Plan: Continue octreotide infusion for now. Continue IV Protonix twice daily. Continue IV ciprofloxacin 400 mg daily for SBP prophylaxis due to ampicillin allergy.  Cipro dosed with renal adjustment. Hold Eliquis, last dose was evening of July 18.  Due to chronic kidney disease, EGD on Thursday.  I have discussed the risks, alternatives, benefits with regards to but not limited to the risk of reaction to medication, bleeding, infection, perforation and the patient is agreeable to proceed. Written consent to be obtained. Continue to monitor H&H closely, transfuse as needed. Will need further imaging of pancreas as outpatient, due to CKD imaging options with contrast limited. To discuss with radiology regarding possible CT pancreas with contrast dose adjusted for CKD. If not able, then she may need EUS.   Laureen Ochs. Bernarda Caffey [facility] [phone] 7/20/202211:59 AM    LOS: 1 day

## 2020-08-04 NOTE — TOC Initial Note (Signed)
Transition of Care [facility]) - Initial/Assessment Note    Patient Details  Name: Beth Padilla MRN: [id number] Date of Birth: 29-Jul-1940  Transition of Care [facility]) CM/SW Contact:    Salome Arnt, LCSW Phone Number: 08/04/2020, 11:46 AM  Clinical Narrative:  Pt admitted with acute GI bleed.  LCSW completed assessment due to high risk readmission score. This is pt's 2nd admission this month. Pt reports she lives with her son. She ambulates with a cane and handheld assist at baseline. Pt indicates she recently started home health PT with [facility]. LCSW confirmed HHPT with Caitlyn at [facility]. Pt plans to return home when medically stable. TOC will continue to follow.                 Expected Discharge Plan: [place] Barriers to Discharge: Continued Medical Work up   Patient Goals and CMS Choice Patient states their goals for this hospitalization and ongoing recovery are:: return home   Choice offered to / list presented to : Patient  Expected Discharge Plan and Services Expected Discharge Plan: [place] In-house Referral: Clinical Social Work   Post Acute Care Choice: Resumption of Svcs/PTA Provider Living arrangements for the past 2 months: Single Family Home                 DME Arranged: N/A         HH Arranged: PT [place] Agency: [place] Date [place]: 08/04/20 Time [place]: 1145 Representative spoke with at [place]: Caitlyn  Prior Living Arrangements/Services Living arrangements for the past 2 months: [place] with:: Adult Children Patient language and need for interpreter reviewed:: Yes Do you feel safe going back to the place where you live?: Yes      Need for Family Participation in Patient Care: Yes (Comment) Care giver support system in place?: Yes (comment) Current home services: DME (walker, cane, 3N1) Criminal Activity/Legal Involvement  Pertinent to Current Situation/Hospitalization: No - Comment as needed  Activities of Daily Living Home Assistive Devices/Equipment: Cane (specify quad or straight) ADL Screening (condition at time of admission) Patient's cognitive ability adequate to safely complete daily activities?: Yes Is the patient deaf or have difficulty hearing?: No Does the patient have difficulty seeing, even when wearing glasses/contacts?: No Does the patient have difficulty concentrating, remembering, or making decisions?: No Patient able to express need for assistance with ADLs?: Yes Does the patient have difficulty dressing or bathing?: No Independently performs ADLs?: Yes (appropriate for developmental age) Does the patient have difficulty walking or climbing stairs?: Yes Weakness of Legs: Both Weakness of Arms/Hands: None  Permission Sought/Granted   Permission granted to share information with : Yes, Verbal Permission Granted     Permission granted to share info w AGENCY: [facility]  Permission granted to share info w Relationship: HHPT     Emotional Assessment   Attitude/Demeanor/Rapport: Engaged Affect (typically observed): Accepting Orientation: : Oriented to Self, Oriented to Place, Oriented to  Time, Oriented to Situation   Psych Involvement: No (comment)  Admission diagnosis:  Acute GI bleeding [K92.2] Gastrointestinal hemorrhage, unspecified gastrointestinal hemorrhage type [K92.2] Patient Active Problem List   Diagnosis Date Noted   Acute GI bleeding 08/03/2020   H/O: CVA (cerebrovascular accident)-- Has Afib 08/03/2020   CKD (chronic kidney disease), stage IV ([place]) 08/03/2020   Heme positive stool    Black stool    Abnormal CT of liver    Acute on  chronic diastolic congestive heart failure ([place]) 70/17/7939   Acute diastolic CHF (congestive heart failure) ([place]) 07/22/2020   Abdominal pain 05/03/2019   Nausea & vomiting 05/03/2019   Depression 05/03/2019   Lobar pneumonia ([place])  02/14/2018   Chronic diastolic HF (heart failure) ([place]) 02/13/2018   Anemia of chronic disease 02/13/2018   Uncontrolled type 2 diabetes mellitus with hyperglycemia ([place]) 02/13/2018   Acute respiratory failure with hypoxia ([place]) 02/13/2018   Paroxysmal atrial fibrillation ([place]) 02/19/2015   CVA (cerebral infarction)    Elevated troponin    Stroke ([place]) 07/18/2014   Acute renal failure superimposed on stage 4 chronic kidney disease ([place]) 09/16/2013   CHF (congestive heart failure) ([place]) 09/14/2013   Bradycardia 09/14/2013   UTI (urinary tract infection) 09/14/2013   Type 2 diabetes with nephropathy ([place]) 12/15/2010   HTN (hypertension) 12/15/2010   Normocytic anemia 12/15/2010   Hypothyroidism 12/15/2010   Hyperlipidemia 12/15/2010   PCP:  Kendrick [place], MD Pharmacy:   [id number] [name], [place] - [address] [place] [address] Phone: [phone] Fax: [phone]  [facility]  ([place] - [place], [place] - [address] [place] [address] Phone: [phone] Fax: [phone]     Social Determinants of Health (SDOH) Interventions    Readmission Risk Interventions Readmission Risk Prevention Plan 08/04/2020 07/23/2020  Transportation Screening Complete Complete  HRI or [place] - Complete  Social Work Consult for [place] Planning/Counseling - Complete  Palliative Care Screening - Not Applicable  Medication Review Press photographer) Complete Complete  HRI or Home Care Consult Complete -  SW Recovery Care/Counseling Consult Complete -  Palliative Care Screening Not Applicable -  [place] Not Applicable -  Some recent data might be hidden

## 2020-08-05 ENCOUNTER — Inpatient Hospital Stay (HOSPITAL_COMMUNITY): Payer: Medicare Other | Admitting: Anesthesiology

## 2020-08-05 ENCOUNTER — Encounter (HOSPITAL_COMMUNITY)
Admission: EM | Disposition: A | Discharge status: Home / Self Care | Payer: Self-pay | Source: Home / Self Care | Attending: Family Medicine

## 2020-08-05 ENCOUNTER — Encounter (HOSPITAL_COMMUNITY): Payer: Self-pay | Admitting: Family Medicine

## 2020-08-05 DIAGNOSIS — K259 Gastric ulcer, unspecified as acute or chronic, without hemorrhage or perforation: Secondary | ICD-10-CM

## 2020-08-05 DIAGNOSIS — K2970 Gastritis, unspecified, without bleeding: Secondary | ICD-10-CM

## 2020-08-05 HISTORY — PX: BIOPSY: SHX5522

## 2020-08-05 HISTORY — PX: ESOPHAGOGASTRODUODENOSCOPY (EGD) WITH PROPOFOL: SHX5813

## 2020-08-05 LAB — BPAM RBC
Blood Product Expiration Date: 202207242359
Blood Product Expiration Date: 202208172359
Blood Product Expiration Date: 202208172359
Blood Product Expiration Date: 202208172359
ISSUE DATE / TIME: 202207191930
ISSUE DATE / TIME: 202207200103
ISSUE DATE / TIME: 202207201114
ISSUE DATE / TIME: 202207201505
Unit Type and Rh: 600
Unit Type and Rh: 6200
Unit Type and Rh: 6200
Unit Type and Rh: 6200

## 2020-08-05 LAB — CBC
HCT: 28.7 % — ABNORMAL LOW (ref 36.0–46.0)
Hemoglobin: 9.2 g/dL — ABNORMAL LOW (ref 12.0–15.0)
MCH: 28.5 pg (ref 26.0–34.0)
MCHC: 32.1 g/dL (ref 30.0–36.0)
MCV: 88.9 fL (ref 80.0–100.0)
Platelets: 250 10*3/uL (ref 150–400)
RBC: 3.23 MIL/uL — ABNORMAL LOW (ref 3.87–5.11)
RDW: 18 % — ABNORMAL HIGH (ref 11.5–15.5)
WBC: 8.8 10*3/uL (ref 4.0–10.5)
nRBC: 0.2 % (ref 0.0–0.2)

## 2020-08-05 LAB — GLUCOSE, CAPILLARY
Glucose-Capillary: 189 mg/dL — ABNORMAL HIGH (ref 70–99)
Glucose-Capillary: 161 mg/dL — ABNORMAL HIGH (ref 70–99)
Glucose-Capillary: 166 mg/dL — ABNORMAL HIGH (ref 70–99)
Glucose-Capillary: 231 mg/dL — ABNORMAL HIGH (ref 70–99)

## 2020-08-05 LAB — TYPE AND SCREEN
ABO/RH(D): A POS
Antibody Screen: NEGATIVE
Unit division: 0
Unit division: 0
Unit division: 0
Unit division: 0

## 2020-08-05 SURGERY — ESOPHAGOGASTRODUODENOSCOPY (EGD) WITH PROPOFOL
Anesthesia: General

## 2020-08-05 MED ORDER — PROPOFOL 10 MG/ML IV BOLUS
INTRAVENOUS | Status: DC | PRN
  Administered 2020-08-05: 80 mg via INTRAVENOUS
  Administered 2020-08-05: 20 mg via INTRAVENOUS

## 2020-08-05 MED ORDER — LACTATED RINGERS IV SOLN: INTRAVENOUS | Status: DC

## 2020-08-05 MED ORDER — SODIUM CHLORIDE 0.9 % IV SOLN: INTRAVENOUS | Status: DC

## 2020-08-05 MED ORDER — LIDOCAINE HCL (CARDIAC) PF 100 MG/5ML IV SOSY
PREFILLED_SYRINGE | INTRAVENOUS | Status: DC | PRN
  Administered 2020-08-05: 50 mg via INTRAVENOUS

## 2020-08-05 MED ORDER — STERILE WATER FOR IRRIGATION IR SOLN
Status: DC | PRN
  Administered 2020-08-05: 100 mL

## 2020-08-05 NOTE — Transfer of Care (Signed)
Immediate Anesthesia Transfer of Care Note  Patient: Beth Padilla  Procedure(s) Performed: ESOPHAGOGASTRODUODENOSCOPY (EGD) WITH PROPOFOL BIOPSY  Patient Location: PACU  Anesthesia Type:General  Level of Consciousness: drowsy  Airway & Oxygen Therapy: Patient Spontanous Breathing  Post-op Assessment: Report given to RN and Post -op Vital signs reviewed and stable  Post vital signs: Reviewed and stable  Last Vitals:  Vitals Value Taken Time  BP 169/72   Temp    Pulse 74   Resp 16   SpO2 99     Last Pain:  Vitals:   08/05/20 1405  TempSrc:   PainSc: 0-No pain      Patients Stated Pain Goal: 4 (31/49/70 2637)  Complications: No notable events documented.

## 2020-08-05 NOTE — Progress Notes (Signed)
Progress note:  Beth Padilla, is a 80 y.o. female   MRN: [id number]    DOB - November 05, 1940  Admit Date - 08/03/2020  Outpatient Primary MD for the patient is Vasireddy, Lanetta Inch, MD    Subjective:   The patient was seen and examined this morning, stable laying in bed comfortable, n.p.o. in anticipation of EGD this morning. Reporting of no further rectal bleed, or bloody stool.  Daughter present at bedside... Findings were discussed in detail    Hospital course:    Beth Padilla  is a 80 y.o. 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 Cirrhosis on CT April 2021, dCHF who presents to the ED with a hemoglobin of 6.1 after being told to come here by PCP due to low hemoglobin on CBC that was drawn on her post-hospitalization checkup -Patient complains of fatigue malaise poor appetite and occasional dizzy spells No syncope, no chest pains no shortness of breath Patient was recently treated for CHF at this hospital discharge on 07/26/2020 with hemoglobin of 7.5 at the time -She missed a dose of her Procrit and since discharge from the hospital on 07/26/2020 she has not been taking iron supplementation -Hemoccult is positive in the ED -Last dose of Eliquis was p.m. of 08/02/2020 -INR is 1.4, Hgb 6.1, platelets 344 -Creatinine is 2.0 which is close to patient's baseline BUN is 72 with a GFR of 25 which is close to patient's baseline, bicarb is 22 and anion gap is not elevated    Assessment & Plan:    Principal Problem:   Acute GI bleeding Active Problems:   Type 2 diabetes with nephropathy (HCC)   HTN (hypertension)    Hypothyroidism   Paroxysmal atrial fibrillation (HCC)   Chronic diastolic HF (heart failure) ([place])   Uncontrolled type 2 diabetes mellitus with hyperglycemia (HCC)   H/O: CVA (cerebrovascular accident)-- Has Afib   CKD (chronic kidney disease), stage IV (HCC)   Heme positive stool   Black stool   Abnormal CT of liver    1) acute on chronic anemia most likely due to ABLA--dark stools + heme positive -Monitoring H&H, remained stable -on 07/26/2020 Hgb was 7.5 , on admission Hgb 6.1>>> s/p 2U PRBC transfusion,Hgb 7.1, still symptomatic another 2 units 08/04/2020 >> 9.3, 9.2  -She missed a dose of her Procrit and since discharge from the hospital on 07/26/2020 she has not been taking iron supplementation -Holding Eliquis last dose 08/02/2020  -N.p.o. anticipating EGD today -GI service would like to wait until the a.m. of Thursday 08/05/2020 to do EGD due to recent Eliquis administration -We will continue IV Protonix, IV octreotide added by GI ?  Possible liver cirrhosis   2)PAFIb--history of prior stroke in 2016- -Very high risk for another stroke, stable  CHA2DS2- VASc score   is =  8 (age x 2, h/o CVA  x2, CHF x1, HTN x 1, gender x 1, DM x 1)   Which is  equal to = 11.2 % annual risk of stroke  -We need to restart Eliquis as soon as it is feasible pending endoscopic findings   3) CKD stage - IV --Creatinine is 2.0 which is close to patient's baseline BUN is 72, GFR is 25 -Creatinine 2.0, 1.98, -Renal function appears to be close to her baseline,  -renally adjust medications, avoid nephrotoxic agents / dehydration  / hypotension -Remained stable  4)DM 2- -A1c 6.8 reflecting good diabetic control PTA -Avoid over aggressive control at this time as patient is to be n.p.o. for EGD -We will check CBG nightly ACH S, SSI coverage  5)HFpEF--history of diastolic CHF  -Stable EF on echo from 07/22/2020 was 55 to 60% monitor volume status closely with transfusions of PRBC, recently afterlife  for acute on chronic diastolic CHF exacerbation  6) anxiety and depression--- comfortable, stable we will, continue Lexapro and buspirone  7) hypothyroidism--- stable continue levothyroxine 100 mcg daily  8) possible liver cirrhosis--- prior CT abdomen and pelvis suggested cirrhosis of the liver -Abdominal ultrasound with Elastoplasty ordered and pending -Avoid hepatotoxic agent -Rocephin for SBP prophylaxis suggested by GI service -Octreotide as above #1  9)HTN  -Monitoring, remained stable -Resuming home medication of  amlodipine 5 mg daily, hydralazine 50 mg 3 times daily, isosorbide 30 mg daily  10) history of prior stroke--- in the setting of A. fib, please see #2 above -Continue Lipitor, Eliquis on hold as above #2  Disposition/Need for in-Hospital Stay- patient unable to be discharged at this time due to -acute on chronic anemia-due to acute GI bleed requiring transfusion of PRBC and endoluminal evaluation to determine when Eliquis can be restarted*  Status is: Inpatient  Remains inpatient appropriate because: Please see disposition above  Dispo: The patient is from: Home              Anticipated d/c is to: Home              Anticipated d/c date is: 2 days              Patient currently is not medically stable to d/c. Barriers: Not Clinically Stable-     Medications:    acetaZOLAMIDE  250 mg Oral Daily   amLODipine  5 mg Oral Daily   atorvastatin  80 mg Oral QPM   busPIRone  10 mg Oral BID   escitalopram  10 mg Oral Daily   furosemide  40 mg Oral Daily   hydrALAZINE  50 mg Oral TID   insulin aspart  0-5 Units Subcutaneous QHS   insulin aspart  0-6 Units Subcutaneous TID WC   isosorbide dinitrate  30 mg Oral TID   levothyroxine  100 mcg Oral QAC breakfast   multivitamin with minerals  1 tablet Oral Daily   pantoprazole (PROTONIX) IV  40 mg Intravenous Q12H   sodium bicarbonate  1,300 mg Oral BID   sodium chloride flush  3 mL Intravenous Q12H   sodium chloride  flush  3 mL Intravenous Q12H      Allergies:     Allergies  Allergen Reactions   Ampicillin Rash     Physical Exam:   Vitals  Blood pressure (!) 143/74, pulse 68, temperature (!) 97.5 F (36.4 C), temperature source Oral, resp. rate 17, height 5\' 7"  (1.702 m), weight 74.7 kg, SpO2 100 %.  Physical Exam:   General:  Alert, oriented, cooperative, no distress;   HEENT:  Normocephalic, PERRL, otherwise with in Normal limits   Neuro:  CNII-XII intact. , normal motor and sensation, reflexes intact   Lungs:   Clear to auscultation BL, Respirations unlabored, no wheezes / crackles  Cardio:    S1/S2, RRR, No murmure, No Rubs or Gallops   Abdomen:   Soft, non-tender, bowel sounds active all four quadrants,  no guarding or peritoneal signs.  Muscular skeletal:  Limited exam - in bed, able to move all 4 extremities, Normal strength,  2+ pulses,  symmetric, No pitting edema  Skin:  Dry, warm to touch, negative for any Rashes,  Wounds: Please see nursing documentation           Data Review:    CBC Recent Labs  Lab 08/03/20 1321 08/04/20 0647 08/04/20 2103 08/05/20 0455  WBC 8.1 8.0  --  8.8  HGB 6.1* 7.1* 9.3* 9.2*  HCT 20.8* 22.1* 28.0* 28.7*  PLT 344 244  --  250  MCV 91.6 87.7  --  88.9  MCH 26.9 28.2  --  28.5  MCHC 29.3* 32.1  --  32.1  RDW 20.5* 18.8*  --  18.0*  LYMPHSABS 0.7  --   --   --   MONOABS 0.4  --   --   --   EOSABS 0.0  --   --   --   BASOSABS 0.0  --   --   --    ------------------------------------------------------------------------------------------------------------------  Chemistries  Recent Labs  Lab 08/03/20 1321 08/03/20 1718 08/04/20 0647  NA 134*  --  135  K 4.7  --  4.7  CL 103  --  106  CO2 22  --  23  GLUCOSE 195*  --  190*  BUN 72*  --  74*  CREATININE 2.00*  --  1.98*  CALCIUM 8.1*  --  7.2*  AST  --  21 15  ALT  --  16 15  ALKPHOS  --  44 38  BILITOT  --  0.4 0.7    ------------------------------------------------------------------------------------------------------------------ estimated creatinine clearance is 23.5 mL/min (A) (by C-G formula based on SCr of 1.98 mg/dL (H)). ------------------------------------------------------------------------------------------------------------------ No results for input(s): TSH, T4TOTAL, T3FREE, THYROIDAB in the last 72 hours.  Invalid input(s): FREET3   Coagulation profile Recent Labs  Lab 08/03/20 1321  INR 1.4*   ------------------------------------------------------------------------------------------------------------------- No results for input(s): DDIMER in the last 72 hours. -------------------------------------------------------------------------------------------------------------------  Cardiac Enzymes No results for input(s): CKMB, TROPONINI, MYOGLOBIN in the last 168 hours.  Invalid input(s): CK ------------------------------------------------------------------------------------------------------------------    Component Value Date/Time   BNP 1,321.0 (H) 07/21/2020 2258    Urinalysis    Component Value Date/Time   COLORURINE STRAW (A) 02/13/2018 1705   APPEARANCEUR CLEAR 02/13/2018 1705   LABSPEC 1.006 02/13/2018 1705   PHURINE 7.0 02/13/2018 1705   GLUCOSEU NEGATIVE 02/13/2018 1705   HGBUR NEGATIVE 02/13/2018 1705   [place] 02/13/2018 [place] 02/13/2018 1705   PROTEINUR 30 (A) 02/13/2018 1705   UROBILINOGEN 0.2 07/18/2014 2120   NITRITE NEGATIVE 02/13/2018 1705   LEUKOCYTESUR SMALL (A) 02/13/2018 1705     Imaging Results:    US ABDOMEN COMPLETE W/ELASTOGRAPHY  Result Date: 08/04/2020 CLINICAL DATA:  Abnormal liver function tests. EXAM: ULTRASOUND ABDOMEN ULTRASOUND HEPATIC ELASTOGRAPHY TECHNIQUE: Sonography of the upper abdomen was performed. In addition, ultrasound elastography evaluation of the liver was performed. A region of interest was  placed within  the right lobe of the liver. Following application of a compressive sonographic pulse, tissue compressibility was assessed. Multiple assessments were performed at the selected site. Median tissue compressibility was determined. Previously, hepatic stiffness was assessed by shear wave velocity. Based on recently published Society of Radiologists in Ultrasound consensus article, reporting is now recommended to be performed in the SI units of pressure (kiloPascals) representing hepatic stiffness/elasticity. The obtained result is compared to the published reference standards. (cACLD = compensated Advanced Chronic Liver Disease) COMPARISON:  CT abdomen 05/03/2019 FINDINGS: ULTRASOUND ABDOMEN Gallbladder: Gallstones measuring up to 1.2 cm in diameter. Sonographic Murphy's sign absent. No significant gallbladder wall thickening. Common bile duct: Diameter: 0.3 cm Liver: Faintly accentuated echogenicity in the liver without definite nodular contours. No focal hepatic lesion identified. Portal vein is patent on color Doppler imaging with normal direction of blood flow towards the liver. IVC: No abnormality visualized. Pancreas: Poor visualization of the pancreatic head and uncinate process, questionable heterogeneity in this region. Spleen: Size and appearance within normal limits. Right Kidney: Length: 9.8 cm. Echogenicity within normal limits. No solid mass or hydronephrosis visualized. Right renal cysts include a 2.0 by 1.8 by 2.0 cm cyst and a 2.0 by 1.7 by 1.9 cm lower pole cyst, both appear simple. Left Kidney: Length: 8.5 cm. Echogenicity within normal limits. No solid mass or hydronephrosis visualized. Left renal cysts include a 1.0 by 1.1 by 0.9 cm cyst and a separate 1.0 by 0.7 by 0.8 cm cyst, both with reasonably simple characteristics. Abdominal aorta: No aneurysm visualized. Other findings: None. ULTRASOUND HEPATIC ELASTOGRAPHY Device: Siemens Helix VTQ Patient position: Supine Transducer 5C1  Number of measurements: 10 Hepatic segment:  8 Median kPa: 7.2 IQR: 0.8 IQR/Median kPa ratio: 0.1 Data quality:  Good Diagnostic category: < or = 9 kPa: in the absence of other known clinical signs, rules out cACLD The use of hepatic elastography is applicable to patients with viral hepatitis and non-alcoholic fatty liver disease. At this time, there is insufficient data for the referenced cut-off values and use in other causes of liver disease, including alcoholic liver disease. Patients, however, may be assessed by elastography and serve as their own reference standard/baseline. In patients with non-alcoholic liver disease, the values suggesting compensated advanced chronic liver disease (cACLD) may be lower, and patients may need additional testing with elasticity results of 7-9 kPa. Please note that abnormal hepatic elasticity and shear wave velocities may also be identified in clinical settings other than with hepatic fibrosis, such as: acute hepatitis, elevated right heart and central venous pressures including use of beta blockers, veno-occlusive disease (Budd-Chiari), infiltrative processes such as mastocytosis/amyloidosis/infiltrative tumor/lymphoma, extrahepatic cholestasis, with hyperemia in the post-prandial state, and with liver transplantation. Correlation with patient history, laboratory data, and clinical condition recommended. Diagnostic Categories: < or =5 kPa: high probability of being normal < or =9 kPa: in the absence of other known clinical signs, rules out cACLD >9 kPa and ?13 kPa: suggestive of cACLD, but needs further testing >13 kPa: highly suggestive of cACLD > or =17 kPa: highly suggestive of cACLD with an increased probability of clinically significant portal hypertension IMPRESSION: ULTRASOUND ABDOMEN: 1. Cholelithiasis without gallbladder wall thickening or sonographic Murphy's sign. 2. Bilateral renal cysts. 3. Poor visualization of the pancreatic head and uncinate process with  questionable heterogeneity in this region. Consider pancreatic protocol CT or MRI for further characterization. ULTRASOUND HEPATIC ELASTOGRAPHY: Median kPa:  7.2 Diagnostic category: < or = 9 kPa: in the absence of other known clinical signs, rules out  cACLD Electronically Signed   By: Van Clines M.D.   On: 08/04/2020 11:24    Radiological Exams on Admission: US ABDOMEN COMPLETE W/ELASTOGRAPHY  Result Date: 08/04/2020 CLINICAL DATA:  Abnormal liver function tests. EXAM: ULTRASOUND ABDOMEN ULTRASOUND HEPATIC ELASTOGRAPHY TECHNIQUE: Sonography of the upper abdomen was performed. In addition, ultrasound elastography evaluation of the liver was performed. A region of interest was placed within the right lobe of the liver. Following application of a compressive sonographic pulse, tissue compressibility was assessed. Multiple assessments were performed at the selected site. Median tissue compressibility was determined. Previously, hepatic stiffness was assessed by shear wave velocity. Based on recently published Society of Radiologists in Ultrasound consensus article, reporting is now recommended to be performed in the SI units of pressure (kiloPascals) representing hepatic stiffness/elasticity. The obtained result is compared to the published reference standards. (cACLD = compensated Advanced Chronic Liver Disease) COMPARISON:  CT abdomen 05/03/2019 FINDINGS: ULTRASOUND ABDOMEN Gallbladder: Gallstones measuring up to 1.2 cm in diameter. Sonographic Murphy's sign absent. No significant gallbladder wall thickening. Common bile duct: Diameter: 0.3 cm Liver: Faintly accentuated echogenicity in the liver without definite nodular contours. No focal hepatic lesion identified. Portal vein is patent on color Doppler imaging with normal direction of blood flow towards the liver. IVC: No abnormality visualized. Pancreas: Poor visualization of the pancreatic head and uncinate process, questionable heterogeneity in this  region. Spleen: Size and appearance within normal limits. Right Kidney: Length: 9.8 cm. Echogenicity within normal limits. No solid mass or hydronephrosis visualized. Right renal cysts include a 2.0 by 1.8 by 2.0 cm cyst and a 2.0 by 1.7 by 1.9 cm lower pole cyst, both appear simple. Left Kidney: Length: 8.5 cm. Echogenicity within normal limits. No solid mass or hydronephrosis visualized. Left renal cysts include a 1.0 by 1.1 by 0.9 cm cyst and a separate 1.0 by 0.7 by 0.8 cm cyst, both with reasonably simple characteristics. Abdominal aorta: No aneurysm visualized. Other findings: None. ULTRASOUND HEPATIC ELASTOGRAPHY Device: Siemens Helix VTQ Patient position: Supine Transducer 5C1 Number of measurements: 10 Hepatic segment:  8 Median kPa: 7.2 IQR: 0.8 IQR/Median kPa ratio: 0.1 Data quality:  Good Diagnostic category: < or = 9 kPa: in the absence of other known clinical signs, rules out cACLD The use of hepatic elastography is applicable to patients with viral hepatitis and non-alcoholic fatty liver disease. At this time, there is insufficient data for the referenced cut-off values and use in other causes of liver disease, including alcoholic liver disease. Patients, however, may be assessed by elastography and serve as their own reference standard/baseline. In patients with non-alcoholic liver disease, the values suggesting compensated advanced chronic liver disease (cACLD) may be lower, and patients may need additional testing with elasticity results of 7-9 kPa. Please note that abnormal hepatic elasticity and shear wave velocities may also be identified in clinical settings other than with hepatic fibrosis, such as: acute hepatitis, elevated right heart and central venous pressures including use of beta blockers, veno-occlusive disease (Budd-Chiari), infiltrative processes such as mastocytosis/amyloidosis/infiltrative tumor/lymphoma, extrahepatic cholestasis, with hyperemia in the post-prandial state, and with  liver transplantation. Correlation with patient history, laboratory data, and clinical condition recommended. Diagnostic Categories: < or =5 kPa: high probability of being normal < or =9 kPa: in the absence of other known clinical signs, rules out cACLD >9 kPa and ?13 kPa: suggestive of cACLD, but needs further testing >13 kPa: highly suggestive of cACLD > or =17 kPa: highly suggestive of cACLD with an increased probability of clinically significant  portal hypertension IMPRESSION: ULTRASOUND ABDOMEN: 1. Cholelithiasis without gallbladder wall thickening or sonographic Murphy's sign. 2. Bilateral renal cysts. 3. Poor visualization of the pancreatic head and uncinate process with questionable heterogeneity in this region. Consider pancreatic protocol CT or MRI for further characterization. ULTRASOUND HEPATIC ELASTOGRAPHY: Median kPa:  7.2 Diagnostic category: < or = 9 kPa: in the absence of other known clinical signs, rules out cACLD Electronically Signed   By: Van Clines M.D.   On: 08/04/2020 11:24    DVT Prophylaxis -SCD/Gi Bleed  AM Labs Ordered, also please review Full Orders  Family Communication: Admission, patients condition and plan of care including tests being ordered have been discussed with the patient  who indicate understanding and agree with the plan   Code Status - Full Code  Likely DC to after endoluminal evaluation  Condition   stable  Deatra James M.D on 08/05/2020 at 12:08 PM Go to [email or web address] -  for contact info  [facility] - Office  [phone]

## 2020-08-05 NOTE — Op Note (Signed)
[facility] Patient Name: Beth Padilla Procedure Date: 08/05/2020 2:05 PM MRN: [id number] Date of Birth: September 28, 1940 Attending MD: Elon Alas. Abbey Chatters DO CSN: [id number] Age: 80 Admit Type: Inpatient Procedure:                Upper GI endoscopy Indications:              Acute post hemorrhagic anemia, Melena Providers:                Elon Alas. Abbey Chatters, DO, Crystal Page, Nelma Rothman,                            Technician Referring MD:              Medicines:                See the Anesthesia note for documentation of the                            administered medications Complications:            No immediate complications. Estimated Blood Loss:     Estimated blood loss was minimal. Procedure:                Pre-Anesthesia Assessment:                           - The anesthesia plan was to use monitored                            anesthesia care (MAC).                           After obtaining informed consent, the endoscope was                            passed under direct vision. Throughout the                            procedure, the patient's blood pressure, pulse, and                            oxygen saturations were monitored continuously. The                            [id number]-[id number] ([id number]) scope was introduced through the                            mouth, and advanced to the second part of duodenum.                            The upper GI endoscopy was accomplished without                            difficulty. The patient tolerated the procedure                            well. Scope In: 2:10:33  PM Scope Out: 2:15:09 PM Total Procedure Duration: 0 hours 4 minutes 36 seconds  Findings:      The Z-line was regular.      There is no endoscopic evidence of bleeding, areas of erosion,       esophagitis, ulcerations or varices in the entire esophagus.      Diffuse moderate inflammation characterized by erythema was found in the       entire examined stomach. Biopsies were  taken with a cold forceps for       Helicobacter pylori testing.      One non-bleeding cratered gastric ulcer with a clean ulcer base (Forrest       Class III) was found in the gastric antrum. The lesion was 8 mm in       largest dimension.      Localized congested mucosa without active bleeding and with no stigmata       of bleeding was found in the duodenal bulb and in the first portion of       the duodenum. Impression:               - Z-line regular.                           - Gastritis. Biopsied.                           - Non-bleeding gastric ulcer with a clean ulcer                            base (Forrest Class III).                           - Congested duodenal mucosa. Moderate Sedation:      Per Anesthesia Care Recommendation:           - Return patient to hospital ward for ongoing care.                           - Full liquid diet.                           - Patient with 1 gastric ulcer, cratered,                            clean-based in her antrum. No active or stigmata of                            bleeding. Also with congestion in her small bowel                            consistent with surrounding duodenitis. Recommend                            continued IV Protonix twice daily while inpatient.                            Okay to stop octreotide, avoid NSAIDs. Okay to  start on full liquids today. I did take biopsies to                            rule out H. pylori which we will follow-up on. She                            will need repeat EGD in 10 to 12 weeks which we                            will arrange. GI to continue to follow. Procedure Code(s):        --- Professional ---                           [phone], Esophagogastroduodenoscopy, flexible,                            transoral; with biopsy, single or multiple Diagnosis Code(s):        --- Professional ---                           K29.70, Gastritis, unspecified, without  bleeding                           K25.9, Gastric ulcer, unspecified as acute or                            chronic, without hemorrhage or perforation                           K31.89, Other diseases of stomach and duodenum                           D62, Acute posthemorrhagic anemia                           K92.1, Melena (includes Hematochezia) CPT copyright 2019 American Medical Association. All rights reserved. The codes documented in this report are preliminary and upon coder review may  be revised to meet current compliance requirements. Elon Alas. Abbey Chatters, DO [place] Abbey Chatters, DO 08/05/2020 2:28:09 PM This report has been signed electronically. Number of Addenda: 0

## 2020-08-05 NOTE — Anesthesia Postprocedure Evaluation (Signed)
Anesthesia Post Note  Patient: Beth Padilla  Procedure(s) Performed: ESOPHAGOGASTRODUODENOSCOPY (EGD) WITH PROPOFOL BIOPSY  Patient location during evaluation: PACU Anesthesia Type: General Level of consciousness: awake and alert and oriented Pain management: pain level controlled Vital Signs Assessment: post-procedure vital signs reviewed and stable Respiratory status: spontaneous breathing and respiratory function stable Cardiovascular status: blood pressure returned to baseline and stable Postop Assessment: no apparent nausea or vomiting Anesthetic complications: no   No notable events documented.   Last Vitals:  Vitals:   08/05/20 1248 08/05/20 1419  BP: (!) 156/66 (!) 169/72  Pulse: 75 73  Resp: 17 18  Temp: 36.6 C 36.7 C  SpO2: 100% 100%    Last Pain:  Vitals:   08/05/20 1419  TempSrc:   PainSc: Asleep                 Areli Frary C Syenna Nazir

## 2020-08-05 NOTE — Interval H&P Note (Signed)
History and Physical Interval Note:  08/05/2020 2:04 PM  [place]  has presented today for surgery, with the diagnosis of Melena and anemia.  The various methods of treatment have been discussed with the patient and family. After consideration of risks, benefits and other options for treatment, the patient has consented to  Procedure(s): ESOPHAGOGASTRODUODENOSCOPY (EGD) WITH PROPOFOL (N/A) as a surgical intervention.  The patient's history has been reviewed, patient examined, no change in status, stable for surgery.  I have reviewed the patient's chart and labs.  Questions were answered to the patient's satisfaction.     Beth Padilla

## 2020-08-05 NOTE — Care Management Important Message (Signed)
Important Message  Patient Details  Name: Beth Padilla MRN: [id number] Date of Birth: 20-Jan-1940   Medicare Important Message Given:        [name] 08/05/2020, 4:20 PM

## 2020-08-05 NOTE — Anesthesia Preprocedure Evaluation (Signed)
Anesthesia Evaluation  Patient identified by MRN, date of birth, ID band Patient awake    Reviewed: Allergy & Precautions, NPO status , Patient's Chart, lab work & pertinent test results  History of Anesthesia Complications Negative for: history of anesthetic complications  Airway Mallampati: II  TM Distance: >3 FB Neck ROM: Full    Dental  (+) Dental Advisory Given, Loose, Missing,    Pulmonary pneumonia,    Pulmonary exam normal breath sounds clear to auscultation       Cardiovascular hypertension, Pt. on medications +CHF  + dysrhythmias Atrial Fibrillation  Rhythm:Irregular Rate:Normal     Neuro/Psych PSYCHIATRIC DISORDERS Depression CVA, Residual Symptoms    GI/Hepatic GERD  Medicated,Acute GI bleed   Endo/Other  diabetes, Well Controlled, Type 2Hypothyroidism   Renal/GU Renal InsufficiencyRenal disease (AKI)     Musculoskeletal negative musculoskeletal ROS (+)   Abdominal   Peds  Hematology  (+) anemia ,   Anesthesia Other Findings   Reproductive/Obstetrics negative OB ROS                            Anesthesia Physical Anesthesia Plan  ASA: 4  Anesthesia Plan: General   Post-op Pain Management:    Induction:   PONV Risk Score and Plan: Propofol infusion  Airway Management Planned: Nasal Cannula and Natural Airway  Additional Equipment:   Intra-op Plan:   Post-operative Plan:   Informed Consent: I have reviewed the patients History and Physical, chart, labs and discussed the procedure including the risks, benefits and alternatives for the proposed anesthesia with the patient or authorized representative who has indicated his/her understanding and acceptance.     Dental advisory given  Plan Discussed with: CRNA and Surgeon  Anesthesia Plan Comments:         Anesthesia Quick Evaluation

## 2020-08-06 ENCOUNTER — Encounter (HOSPITAL_COMMUNITY): Payer: Self-pay | Admitting: Internal Medicine

## 2020-08-06 ENCOUNTER — Telehealth: Payer: Self-pay | Admitting: Gastroenterology

## 2020-08-06 DIAGNOSIS — R932 Abnormal findings on diagnostic imaging of liver and biliary tract: Secondary | ICD-10-CM | POA: Diagnosis not present

## 2020-08-06 DIAGNOSIS — K922 Gastrointestinal hemorrhage, unspecified: Secondary | ICD-10-CM | POA: Diagnosis not present

## 2020-08-06 LAB — CBC
HCT: 27.2 % — ABNORMAL LOW (ref 36.0–46.0)
Hemoglobin: 8.7 g/dL — ABNORMAL LOW (ref 12.0–15.0)
MCH: 29.1 pg (ref 26.0–34.0)
MCHC: 32 g/dL (ref 30.0–36.0)
MCV: 91 fL (ref 80.0–100.0)
Platelets: 238 10*3/uL (ref 150–400)
RBC: 2.99 MIL/uL — ABNORMAL LOW (ref 3.87–5.11)
RDW: 18 % — ABNORMAL HIGH (ref 11.5–15.5)
WBC: 7.6 10*3/uL (ref 4.0–10.5)
nRBC: 0.3 % — ABNORMAL HIGH (ref 0.0–0.2)

## 2020-08-06 LAB — GLUCOSE, CAPILLARY
Glucose-Capillary: 145 mg/dL — ABNORMAL HIGH (ref 70–99)
Glucose-Capillary: 165 mg/dL — ABNORMAL HIGH (ref 70–99)
Glucose-Capillary: 168 mg/dL — ABNORMAL HIGH (ref 70–99)
Glucose-Capillary: 194 mg/dL — ABNORMAL HIGH (ref 70–99)
Glucose-Capillary: 216 mg/dL — ABNORMAL HIGH (ref 70–99)
Glucose-Capillary: 227 mg/dL — ABNORMAL HIGH (ref 70–99)

## 2020-08-06 MED ORDER — FUROSEMIDE 40 MG PO TABS
40.0000 mg | ORAL_TABLET | Freq: Every day | ORAL | Status: DC
  Administered 2020-08-07: 40 mg via ORAL
  Filled 2020-08-06: qty 1

## 2020-08-06 NOTE — Evaluation (Signed)
Physical Therapy Evaluation Patient Details Name: Beth Padilla MRN: [id number] DOB: [birth year] Today's Date: 08/06/2020   History of Present Illness  Beth Padilla  is a 80 y.o. 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 Cirrhosis on CT April 2021, dchf who presents to the ED with a hemoglobin of 6.1 after being told to come here by PCP due to low hemoglobin on CBC that was drawn on her posthospitalization checkup   Clinical Impression  Patient limited for functional mobility as stated below secondary to BLE weakness, fatigue and impaired standing balance. Patient does not require assist for bed mobility but does require increased time to complete. She requires mod assist and use of RW to transfer to standing due to bilateral LE weakness. She ambulates with assist and RW to chair at bedside. Patient ends session seated in chair at bedside. Patient discharged to care of nursing for ambulation daily as tolerated for length of stay.      Follow Up Recommendations Home health PT;Supervision for mobility/OOB    Equipment Recommendations  None recommended by PT    Recommendations for Other Services       Precautions / Restrictions Precautions Precautions: Fall Restrictions Weight Bearing Restrictions: No      Mobility  Bed Mobility Overal bed mobility: Modified Independent             General bed mobility comments: increased time    Transfers Overall transfer level: Needs assistance Equipment used: Rolling walker (2 wheeled) Transfers: Sit to/from [facility] Sit to Stand: Mod assist Stand pivot transfers: Mod assist       General transfer comment: slow, labored and requires several attempts to transfer to standing with RW  Ambulation/Gait Ambulation/Gait  assistance: Min assist Gait Distance (Feet): 3 Feet Assistive device: Rolling walker (2 wheeled) Gait Pattern/deviations: Shuffle Gait velocity: decreased   General Gait Details: slow, labored cadence to chair at bedside  Stairs            Wheelchair Mobility    Modified Rankin (Stroke Patients Only)       Balance Overall balance assessment: Needs assistance Sitting-balance support: Feet supported;No upper extremity supported Sitting balance-Leahy Scale: Good Sitting balance - Comments: seated EOB   Standing balance support: Bilateral upper extremity supported Standing balance-Leahy Scale: Fair Standing balance comment: with RW                             Pertinent Vitals/Pain Pain Assessment: No/denies pain    Home Living Family/patient expects to be discharged to:: Private residence   Available Help at Discharge: Family;Available 24 hours/day Type of Home: House Home Access: Stairs to enter Entrance Stairs-Rails: Can reach both;Right;Left Entrance Stairs-Number of Steps: unable to state number of stairs Home Layout: One level Home Equipment: Walker - 2 wheels;Cane - single point;Shower seat      Prior Function Level of Independence: Independent with assistive device(s)         Comments: Patient states independent with basic ADL, ambulates with SPC or RW     Hand Dominance        Extremity/Trunk Assessment                Communication   Communication: No difficulties  Cognition Arousal/Alertness: Awake/alert Behavior During Therapy: WFL for tasks assessed/performed  Overall Cognitive Status: Within Functional Limits for tasks assessed                                        General Comments      Exercises     Assessment/Plan    PT Assessment All further PT needs can be met in the next venue of care  PT Problem List Decreased strength;Decreased mobility;Decreased activity tolerance;Decreased balance        PT Treatment Interventions      PT Goals (Current goals can be found in the Care Plan section)  Acute Rehab PT Goals Patient Stated Goal: return home PT Goal Formulation: With patient Time For Goal Achievement: 08/06/20 Potential to Achieve Goals: Good    Frequency     Barriers to discharge        Co-evaluation               AM-PAC PT "6 Clicks" Mobility  Outcome Measure Help needed turning from your back to your side while in a flat bed without using bedrails?: None Help needed moving from lying on your back to sitting on the side of a flat bed without using bedrails?: A Little Help needed moving to and from a bed to a chair (including a wheelchair)?: A Lot Help needed standing up from a chair using your arms (e.g., wheelchair or bedside chair)?: A Little Help needed to walk in hospital room?: A Little Help needed climbing 3-5 steps with a railing? : A Lot 6 Click Score: 17    End of Session   Activity Tolerance: Patient tolerated treatment well Patient left: in chair;with call bell/phone within reach Nurse Communication: Mobility status PT Visit Diagnosis: Unsteadiness on feet (R26.81);Other abnormalities of gait and mobility (R26.89);Muscle weakness (generalized) (M62.81)    Time: 8022-3361 PT Time Calculation (min) (ACUTE ONLY): 14 min   Charges:   PT Evaluation $PT Eval Low Complexity: 1 Low         10:02 AM, 08/06/20 Mearl Latin PT, DPT Physical Therapist at [facility]

## 2020-08-06 NOTE — Progress Notes (Signed)
Subjective:  Last BM 08/04/20. Feels good. Hungry.   Objective: Vital signs in last 24 hours: Temp:  [97.8 F (36.6 C)-99.1 F (37.3 C)] 99.1 F (37.3 C) (07/22 0550) Pulse Rate:  [73-79] 73 (07/22 0550) Resp:  [17-19] 19 (07/22 0550) BP: (106-169)/(54-93) 106/93 (07/22 0550) SpO2:  [98 %-100 %] 98 % (07/22 0550) FiO2 (%):  [28 %] 28 % (07/21 1423) Last BM Date: 08/04/20 General:   Alert,  Well-developed, well-nourished, pleasant and cooperative in NAD Head:  Normocephalic and atraumatic. Eyes:  Sclera clear, no icterus.  Abdomen:  Soft, nontender and nondistended. N Normal bowel sounds, without guarding, and without rebound.   Extremities:  Without clubbing, deformity or edema. Neurologic:  Alert and  oriented x4;  grossly normal neurologically. Skin:  Intact without significant lesions or rashes. Psych:  Alert and cooperative. Normal mood and affect.  Intake/Output from previous day: 07/21 0701 - 07/22 0700 In: 350 [P.O.:50; I.V.:300] Out: 500 [Urine:500] Intake/Output this shift: No intake/output data recorded.  Lab Results: CBC Recent Labs    08/04/20 0647 08/04/20 2103 08/05/20 0455 08/06/20 0455  WBC 8.0  --  8.8 7.6  HGB 7.1* 9.3* 9.2* 8.7*  HCT 22.1* 28.0* 28.7* 27.2*  MCV 87.7  --  88.9 91.0  PLT 244  --  250 238   BMET Recent Labs    08/03/20 1321 08/04/20 0647  NA 134* 135  K 4.7 4.7  CL 103 106  CO2 22 23  GLUCOSE 195* 190*  BUN 72* 74*  CREATININE 2.00* 1.98*  CALCIUM 8.1* 7.2*   LFTs Recent Labs    08/03/20 1718 08/04/20 0647  BILITOT 0.4 0.7  BILIDIR 0.1  --   IBILI 0.3  --   ALKPHOS 44 38  AST 21 15  ALT 16 15  PROT 6.5 5.5*  ALBUMIN 2.6* 2.3*   No results for input(s): LIPASE in the last 72 hours. PT/INR Recent Labs    08/03/20 1321  LABPROT 16.9*  INR 1.4*      Imaging Studies: DG Chest 2 View  Result Date: 07/24/2020 CLINICAL DATA:  Shortness of breath and coughing EXAM: CHEST - 2 VIEW COMPARISON:  07/21/2020  FINDINGS: Patchy bilateral airspace opacity, again favoring pneumonia. Stable cardiomegaly. No visible effusion or pneumothorax. IMPRESSION: Unchanged bilateral pulmonary infiltrates. Electronically Signed   By: Monte Fantasia M.D.   On: 07/24/2020 11:21   DG Chest 2 View  Result Date: 07/21/2020 CLINICAL DATA:  Shortness of breath, COVID positive 06/14/2020 EXAM: CHEST - 2 VIEW COMPARISON:  06/18/2020 FINDINGS: Patchy right lower lobe opacity, progressive from the prior, suspicious for pneumonia. Mild left basilar opacity, similar to the prior, atelectasis versus pneumonia. No pleural effusion or pneumothorax. Cardiomegaly.  Thoracic aortic atherosclerosis. IMPRESSION: Progressive right lower lobe opacity, suspicious for pneumonia. Mild left basilar opacity, atelectasis versus pneumonia. Electronically Signed   By: Julian Hy M.D.   On: 07/21/2020 19:55   US ABDOMEN COMPLETE W/ELASTOGRAPHY  Result Date: 08/04/2020 CLINICAL DATA:  Abnormal liver function tests. EXAM: ULTRASOUND ABDOMEN ULTRASOUND HEPATIC ELASTOGRAPHY TECHNIQUE: Sonography of the upper abdomen was performed. In addition, ultrasound elastography evaluation of the liver was performed. A region of interest was placed within the right lobe of the liver. Following application of a compressive sonographic pulse, tissue compressibility was assessed. Multiple assessments were performed at the selected site. Median tissue compressibility was determined. Previously, hepatic stiffness was assessed by shear wave velocity. Based on recently published Society of Radiologists in Ultrasound consensus article, reporting is  now recommended to be performed in the SI units of pressure (kiloPascals) representing hepatic stiffness/elasticity. The obtained result is compared to the published reference standards. (cACLD = compensated Advanced Chronic Liver Disease) COMPARISON:  CT abdomen 05/03/2019 FINDINGS: ULTRASOUND ABDOMEN Gallbladder: Gallstones  measuring up to 1.2 cm in diameter. Sonographic Murphy's sign absent. No significant gallbladder wall thickening. Common bile duct: Diameter: 0.3 cm Liver: Faintly accentuated echogenicity in the liver without definite nodular contours. No focal hepatic lesion identified. Portal vein is patent on color Doppler imaging with normal direction of blood flow towards the liver. IVC: No abnormality visualized. Pancreas: Poor visualization of the pancreatic head and uncinate process, questionable heterogeneity in this region. Spleen: Size and appearance within normal limits. Right Kidney: Length: 9.8 cm. Echogenicity within normal limits. No solid mass or hydronephrosis visualized. Right renal cysts include a 2.0 by 1.8 by 2.0 cm cyst and a 2.0 by 1.7 by 1.9 cm lower pole cyst, both appear simple. Left Kidney: Length: 8.5 cm. Echogenicity within normal limits. No solid mass or hydronephrosis visualized. Left renal cysts include a 1.0 by 1.1 by 0.9 cm cyst and a separate 1.0 by 0.7 by 0.8 cm cyst, both with reasonably simple characteristics. Abdominal aorta: No aneurysm visualized. Other findings: None. ULTRASOUND HEPATIC ELASTOGRAPHY Device: Siemens Helix VTQ Patient position: Supine Transducer 5C1 Number of measurements: 10 Hepatic segment:  8 Median kPa: 7.2 IQR: 0.8 IQR/Median kPa ratio: 0.1 Data quality:  Good Diagnostic category: < or = 9 kPa: in the absence of other known clinical signs, rules out cACLD The use of hepatic elastography is applicable to patients with viral hepatitis and non-alcoholic fatty liver disease. At this time, there is insufficient data for the referenced cut-off values and use in other causes of liver disease, including alcoholic liver disease. Patients, however, may be assessed by elastography and serve as their own reference standard/baseline. In patients with non-alcoholic liver disease, the values suggesting compensated advanced chronic liver disease (cACLD) may be lower, and patients may  need additional testing with elasticity results of 7-9 kPa. Please note that abnormal hepatic elasticity and shear wave velocities may also be identified in clinical settings other than with hepatic fibrosis, such as: acute hepatitis, elevated right heart and central venous pressures including use of beta blockers, veno-occlusive disease (Budd-Chiari), infiltrative processes such as mastocytosis/amyloidosis/infiltrative tumor/lymphoma, extrahepatic cholestasis, with hyperemia in the post-prandial state, and with liver transplantation. Correlation with patient history, laboratory data, and clinical condition recommended. Diagnostic Categories: < or =5 kPa: high probability of being normal < or =9 kPa: in the absence of other known clinical signs, rules out cACLD >9 kPa and ?13 kPa: suggestive of cACLD, but needs further testing >13 kPa: highly suggestive of cACLD > or =17 kPa: highly suggestive of cACLD with an increased probability of clinically significant portal hypertension IMPRESSION: ULTRASOUND ABDOMEN: 1. Cholelithiasis without gallbladder wall thickening or sonographic Murphy's sign. 2. Bilateral renal cysts. 3. Poor visualization of the pancreatic head and uncinate process with questionable heterogeneity in this region. Consider pancreatic protocol CT or MRI for further characterization. ULTRASOUND HEPATIC ELASTOGRAPHY: Median kPa:  7.2 Diagnostic category: < or = 9 kPa: in the absence of other known clinical signs, rules out cACLD Electronically Signed   By: Van Clines M.D.   On: 08/04/2020 11:24   ECHOCARDIOGRAM COMPLETE  Result Date: 07/22/2020    ECHOCARDIOGRAM REPORT   Patient Name:   Beth Padilla Date of Exam: 07/22/2020 Medical Rec #:  [id number]  Height:       67.0 in Accession #:    [id number]         Weight:       167.0 lb Date of Birth:  01-Nov-1940          BSA:          1.873 m Patient Age:    12 years           BP:           204/68 mmHg Patient Gender: F                   HR:           88 bpm. Exam Location:  [facility] Procedure: 2D Echo, Cardiac Doppler and Color Doppler Indications:    CHF Acute Diastolic  History:        Patient has prior history of Echocardiogram examinations, most                 recent 02/14/2018. CHF, Stroke, Arrythmias:Atrial Fibrillation;                 Risk Factors:Hypertension and Diabetes.  Sonographer:    Wenda Low Referring Phys: [id number] [place]  1. Left ventricular ejection fraction, by estimation, is 55 to 60%. The left ventricle has normal function. The left ventricle has no regional wall motion abnormalities. There is mild left ventricular hypertrophy. Left ventricular diastolic parameters are indeterminate.  2. Right ventricular systolic function is mildly reduced. The right ventricular size is mildly enlarged. Mildly increased right ventricular wall thickness.  3. Left atrial size was moderately dilated.  4. Right atrial size was mildly dilated.  5. Trivial mitral valve regurgitation.  6. AV is thickened, calcified with restricted motion. Peak and mean pressures through the valve are 37 and 20 mm Hg resepctively AVA (VTI) is 1.2 cm2. Dimentionless index is 0.39 consistent with moderate aortic stenosis. Since echo from Jan 2020 there is no significant change.. Aortic valve regurgitation is mild.  7. The inferior vena cava is dilated in size with <50% respiratory variability, suggesting right atrial pressure of 15 mmHg. FINDINGS  Left Ventricle: Left ventricular ejection fraction, by estimation, is 55 to 60%. The left ventricle has normal function. The left ventricle has no regional wall motion abnormalities. The left ventricular internal cavity size was normal in size. There is  mild left ventricular hypertrophy. Left ventricular diastolic parameters are indeterminate. Right Ventricle: The right ventricular size is mildly enlarged. Mildly increased right ventricular wall thickness. Right ventricular systolic function  is mildly reduced. Left Atrium: Left atrial size was moderately dilated. Right Atrium: Right atrial size was mildly dilated. Pericardium: Trivial pericardial effusion is present. Mitral Valve: There is mild thickening of the mitral valve leaflet(s). Mild mitral annular calcification. Trivial mitral valve regurgitation. MV peak gradient, 13.5 mmHg. The mean mitral valve gradient is 3.0 mmHg. Tricuspid Valve: The tricuspid valve is normal in structure. Tricuspid valve regurgitation is mild. Aortic Valve: AV is thickened, calcified with restricted motion. Peak and mean pressures through the valve are 37 and 20 mm Hg resepctively AVA (VTI) is 1.2 cm2. Dimentionless index is 0.39 consistent with moderate aortic stenosis. Since echo from Jan 2020 there is no significant change. Aortic valve regurgitation is mild. Aortic regurgitation PHT measures 379 msec. Aortic valve mean gradient measures 19.5 mmHg. Aortic valve peak gradient measures 37.0 mmHg. Aortic valve area, by VTI measures 1.12 cm. Pulmonic Valve: The pulmonic valve was  grossly normal. Pulmonic valve regurgitation is trivial. Aorta: The aortic root and ascending aorta are structurally normal, with no evidence of dilitation. Venous: The inferior vena cava is dilated in size with less than 50% respiratory variability, suggesting right atrial pressure of 15 mmHg. IAS/Shunts: No atrial level shunt detected by color flow Doppler.  LEFT VENTRICLE PLAX 2D LVIDd:         4.26 cm  Diastology LVIDs:         3.07 cm  LV e' medial:    8.19 cm/s LV PW:         1.48 cm  LV E/e' medial:  19.3 LV IVS:        0.98 cm  LV e' lateral:   9.89 cm/s LVOT diam:     1.90 cm  LV E/e' lateral: 16.0 LV SV:         86 LV SV Index:   46 LVOT Area:     2.84 cm  RIGHT VENTRICLE RV Basal diam:  5.21 cm RV Mid diam:    3.21 cm RV S prime:     9.81 cm/s TAPSE (M-mode): 2.8 cm LEFT ATRIUM              Index       RIGHT ATRIUM           Index LA diam:        5.40 cm  2.88 cm/m  RA Area:      20.70 cm LA Vol (A2C):   118.0 ml 63.00 ml/m RA Volume:   66.60 ml  35.56 ml/m LA Vol (A4C):   81.7 ml  43.62 ml/m LA Biplane Vol: 105.0 ml 56.06 ml/m  AORTIC VALVE AV Area (Vmax):    1.18 cm AV Area (Vmean):   1.18 cm AV Area (VTI):     1.12 cm AV Vmax:           304.00 cm/s AV Vmean:          202.500 cm/s AV VTI:            0.765 m AV Peak Grad:      37.0 mmHg AV Mean Grad:      19.5 mmHg LVOT Vmax:         127.00 cm/s LVOT Vmean:        84.400 cm/s LVOT VTI:          0.302 m LVOT/AV VTI ratio: 0.39 AI PHT:            379 msec  AORTA Ao Root diam: 3.30 cm Ao Asc diam:  3.80 cm MITRAL VALVE                TRICUSPID VALVE MV Area (PHT): 3.83 cm     TR Peak grad:   38.4 mmHg MV Area VTI:   1.97 cm     TR Vmax:        310.00 cm/s MV Peak grad:  13.5 mmHg MV Mean grad:  3.0 mmHg     SHUNTS MV Vmax:       1.84 m/s     Systemic VTI:  0.30 m MV Vmean:      71.7 cm/s    Systemic Diam: 1.90 cm MV Decel Time: 198 msec MV E velocity: 158.00 cm/s MV A velocity: 64.00 cm/s MV E/A ratio:  2.47 Dorris Carnes MD Electronically signed by Dorris Carnes MD Signature Date/Time: 07/22/2020/5:33:45 PM    Final   [2 weeks]  Assessment: 80 year old female with history of chronic anemia, paroxysmal A. fib on Eliquis, moderate aortic stenosis, stroke, diabetes, hypertension, chronic kidney disease, hypothyroidism, noted nodular liver on CT April 2021, recent hospitalization for acute on chronic CHF (discharged July 11).  Received recent unit of packed red blood cells and IV iron during last hospitalization.  Presented to the ED this admission at the request of PCP for low hemoglobin.  Acute on chronic anemia/melena: Presented with hemoglobin of 6.1 entheses down from 7.5 on July 11).  Appears chronic anemia with hemoglobin 8-9 range likely multifactorial in the setting of anemia of chronic disease, element of IDA given low normal ferritin/iron/iron saturations.  Patient noted black stool on oral iron but cleared up and stopping  iron.  In the ED, nursing staff reported black heme positive stool.  No NSAID or alcohol use.  Has required 4 units of packed red blood cells this admission, hemoglobin this morning 8.7.  EGD August 05, 2020: Gastritis status postbiopsy.  Nonbleeding gastric ulcer with clean ulcer base.  Congested duodenal mucosa.  Repeat EGD in 10 to 12 weeks.  CT imaging April 2021 with suggestion of nodular liver with relative hypertrophy of the caudate and left hepatic lobe consistent with cirrhosis.  Spleen normal at that time.  Platelet counts have been normal.  No significant findings to suggest portal hypertensive gastropathy on current EGD.  Ultrasound with elastography this admission without definite nodular hepatic contours. Median kPa is 7.2.Poor visualization of the pancreatic head and uncinate process with questionable heterogeneity in this region. Consider pancreatic protocol CT or MRI for further characterization  Plan: Continue IV Protonix twice daily while inpatient, transition to oral twice daily at discharge. Consider additional imaging of the pancreas as an outpatient,due to CKD imaging options with contrast limited. To discuss with radiology regarding possible CT pancreas with contrast dose adjusted for CKD. If not able, then she may need EUS. Complete 7 days of antibiotic therapy for SBP prophylaxis. Can restart Eliquis 72 hours.  Advance to soft diet.  Repeat EGD in 10-12 weeks.  GI to sign off. Call with questions.   Laureen Ochs. Bernarda Caffey [facility] [phone] 7/22/202210:47 AM    LOS: 3 days

## 2020-08-06 NOTE — Telephone Encounter (Signed)
Patient seen recently in the hospital, assigned to Dr. Jenetta Downer.  She will need EGD in 10 to 12 weeks for follow-up of gastric ulcer.   Consider additional imaging of the pancreas as an outpatient,due to CKD imaging options with contrast limited.  Per Dr. Jenetta Downer, he will address at office visit. Needs outpatient follow-up pancreatic lesion, gastric ulcer, cirrhosis.

## 2020-08-06 NOTE — Progress Notes (Signed)
Bloody                                                                                                                                                                         Progress note:  Beth Padilla, is a 80 y.o. female   MRN: [id number]    DOB - [birth year]  Admit Date - 08/03/2020  Outpatient Primary MD for the patient is Vasireddy, Lanetta Inch, MD    Subjective:   The patient was seen and examined this morning, sitting up in a chair awake alert no acute distress.  Reporting of no ongoing rectal bleed overnight.  No issues overnight Status post EGD yesterday tolerated well   Hospital course:    Beth Padilla  is a 80 y.o. 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 Cirrhosis on CT April 2021, dCHF who presents to the ED with a hemoglobin of 6.1 after being told to come here by PCP due to low hemoglobin on CBC that was drawn on her post-hospitalization checkup -Patient complains of fatigue malaise poor appetite and occasional dizzy spells No syncope, no chest pains no shortness of breath Patient was recently treated for CHF at this hospital discharge on 07/26/2020 with hemoglobin of 7.5 at the time -She missed a dose of her Procrit and since discharge from the hospital on 07/26/2020 she has not been taking iron supplementation -Hemoccult is positive in the ED -Last dose of Eliquis was p.m. of 08/02/2020 -INR is 1.4, Hgb 6.1, platelets 344 -Creatinine is 2.0 which is close to patient's baseline BUN is 72 with a GFR of 25 which is close to patient's baseline, bicarb is 22 and anion gap is not elevated    Assessment & Plan:    Principal Problem:   Acute GI bleeding Active Problems:   Type 2 diabetes with nephropathy (HCC)   HTN (hypertension)   Hypothyroidism   Paroxysmal atrial  fibrillation (HCC)   Chronic diastolic HF (heart failure) ([place])   Uncontrolled type 2 diabetes mellitus with hyperglycemia (HCC)   H/O: CVA (cerebrovascular accident)-- Has Afib   CKD (chronic kidney disease), stage IV (HCC)   Heme positive stool   Black stool   Abnormal CT of liver    1) acute on chronic anemia most likely due to ABLA--dark stools + heme positive -Monitoring H&H -on 07/26/2020 Hgb was 7.5 , on admission Hgb 6.1>>> s/p 2U PRBC transfusion,Hgb 7.1, still symptomatic another 2 units 08/04/2020 >> 9.3, 9.2 >> 8.7  -She missed a dose of her Procrit and since discharge from the hospital on 07/26/2020 she has  not been taking iron supplementation -Holding Eliquis last dose 08/02/2020  -Status post EGD 08/05/2020: Findings: Nonbleeding gastric ulcer duodenitis, congested duodenal mucosa Gastric biopsy. Gastroenterologist recommended clear liquid diet, IV Protonix twice daily monitoring H&H  -CT on this admission revealed nodular liver possible liver cirrhosis, LFTs within normal limits,, no signs of portal hypertension, -Ultrasound on this admission to find no nodular hepatic -GI considering pancreatic CT with contrast in the future versus EUS (contrast not indicated due to CKD stage IV) -Recommending 7 days of antibiotic for SBP prophylaxis -Resuming Eliquis in 72 hours -Advancing to soft diet -Repeat EGD 10-12 weeks GI has signed off    2)PAFIb--history of prior stroke in 2016- -Very high risk for another stroke, stable  CHA2DS2- VASc score   is =  8 (age x 2, h/o CVA  x2, CHF x1, HTN x 1, gender x 1, DM x 1)   Which is  equal to = 11.2 % annual risk of stroke  -Per GI will resume Eliquis within 72 hours   3) CKD stage - IV --Creatinine is 2.0 which is close to patient's baseline BUN is 72, GFR is 25 -Creatinine 2.0, 1.98, -Renal function appears to be close to her baseline,  -renally adjust medications, avoid nephrotoxic agents / dehydration  / hypotension --Remained  stable  4)DM 2- -A1c 6.8 reflecting good diabetic control PTA -Avoid over aggressive control at this time as patient is to be n.p.o. for EGD -We will check CBG nightly ACH S, SSI coverage  5)HFpEF--history of diastolic CHF  -Stable in no respiratory distress -Monitoring daily weight I's and O's EF on echo from 07/22/2020 was 55 to 60% monitor volume status closely with transfusions of PRBC, recently afterlife for acute on chronic diastolic CHF exacerbation  6) anxiety and depression--- comfortable, stable we will, continue Lexapro and buspirone  7) hypothyroidism--- stable continue levothyroxine 100 mcg daily  8) possible liver cirrhosis--- prior CT abdomen and pelvis suggested cirrhosis of the liver -Abdominal ultrasound with Elastoplasty -revealed no nodularity -LFTs within normal limits -Avoid hepatotoxic agent -Rocephin for SBP prophylaxis suggested by GI service--recommend total of 7 days of p.o. antibiotics -Octreotide was discontinued  9)HTN  -Elevated BP, restarting home medication -Resuming home medication of  amlodipine 5 mg daily, hydralazine 50 mg 3 times daily, isosorbide 30 mg daily  10) history of prior stroke--- in the setting of A. fib, please see #2 above -Continue Lipitor,  -Eliquis on hold as above #2... We will restart in 72 hours  Disposition/Need for in-Hospital Stay- patient unable to be discharged at this time due to -acute on chronic anemia-due to acute GI bleed requiring transfusion of PRBC and endoluminal evaluation to determine when Eliquis can be restarted*  Status is: Inpatient  Remains inpatient appropriate because: Please see disposition above  Dispo: The patient is from: Home              Anticipated d/c is to: Home              Anticipated d/c date is: 2 days              Patient currently is not medically stable to d/c. Barriers: Not Clinically Stable-     Medications:    acetaZOLAMIDE  250 mg Oral Daily   amLODipine  5 mg Oral Daily    atorvastatin  80 mg Oral QPM   busPIRone  10 mg Oral BID   escitalopram  10 mg Oral Daily   [START ON 08/07/2020]  furosemide  40 mg Oral Daily   hydrALAZINE  50 mg Oral TID   insulin aspart  0-5 Units Subcutaneous QHS   insulin aspart  0-6 Units Subcutaneous TID WC   isosorbide dinitrate  30 mg Oral TID   levothyroxine  100 mcg Oral QAC breakfast   multivitamin with minerals  1 tablet Oral Daily   pantoprazole (PROTONIX) IV  40 mg Intravenous Q12H   sodium bicarbonate  1,300 mg Oral BID   sodium chloride flush  3 mL Intravenous Q12H   sodium chloride flush  3 mL Intravenous Q12H      Allergies:     Allergies  Allergen Reactions   Ampicillin Rash     Physical Exam:   Vitals  Blood pressure (!) 160/47, pulse (!) 59, temperature 99.1 F (37.3 C), temperature source Oral, resp. rate 19, height 5\' 7"  (1.702 m), weight 74.7 kg, SpO2 98 %.    Physical Exam:   General:  Alert, oriented, cooperative, no distress;   HEENT:  Normocephalic, PERRL, otherwise with in Normal limits   Neuro:  CNII-XII intact. , normal motor and sensation, reflexes intact   Lungs:   Clear to auscultation BL, Respirations unlabored, no wheezes / crackles  Cardio:    S1/S2, RRR, No murmure, No Rubs or Gallops   Abdomen:   Soft, non-tender, bowel sounds active all four quadrants,  no guarding or peritoneal signs.  Muscular skeletal:  Limited exam - in bed, able to move all 4 extremities, Normal strength,  2+ pulses,  symmetric, No pitting edema  Skin:  Dry, warm to touch, negative for any Rashes,  Wounds: Please see nursing documentation       Data Review:    CBC Recent Labs  Lab 08/03/20 1321 08/04/20 0647 08/04/20 2103 08/05/20 0455 08/06/20 0455  WBC 8.1 8.0  --  8.8 7.6  HGB 6.1* 7.1* 9.3* 9.2* 8.7*  HCT 20.8* 22.1* 28.0* 28.7* 27.2*  PLT 344 244  --  250 238  MCV 91.6 87.7  --  88.9 91.0  MCH 26.9 28.2  --  28.5 29.1  MCHC 29.3* 32.1  --  32.1 32.0  RDW 20.5* 18.8*  --  18.0*  18.0*  LYMPHSABS 0.7  --   --   --   --   MONOABS 0.4  --   --   --   --   EOSABS 0.0  --   --   --   --   BASOSABS 0.0  --   --   --   --    ------------------------------------------------------------------------------------------------------------------  Chemistries  Recent Labs  Lab 08/03/20 1321 08/03/20 1718 08/04/20 0647  NA 134*  --  135  K 4.7  --  4.7  CL 103  --  106  CO2 22  --  23  GLUCOSE 195*  --  190*  BUN 72*  --  74*  CREATININE 2.00*  --  1.98*  CALCIUM 8.1*  --  7.2*  AST  --  21 15  ALT  --  16 15  ALKPHOS  --  44 38  BILITOT  --  0.4 0.7   ------------------------------------------------------------------------------------------------------------------ estimated creatinine clearance is 23.5 mL/min (A) (by C-G formula based on SCr of 1.98 mg/dL (H)). ------------------------------------------------------------------------------------------------------------------ No results for input(s): TSH, T4TOTAL, T3FREE, THYROIDAB in the last 72 hours.  Invalid input(s): FREET3   Coagulation profile Recent Labs  Lab 08/03/20 1321  INR 1.4*   ------------------------------------------------------------------------------------------------------------------- No results for input(s): DDIMER in the  last 72 hours. -------------------------------------------------------------------------------------------------------------------  Cardiac Enzymes No results for input(s): CKMB, TROPONINI, MYOGLOBIN in the last 168 hours.  Invalid input(s): CK ------------------------------------------------------------------------------------------------------------------    Component Value Date/Time   BNP 1,321.0 (H) 07/21/2020 2258    Urinalysis    Component Value Date/Time   COLORURINE STRAW (A) 02/13/2018 1705   APPEARANCEUR CLEAR 02/13/2018 1705   LABSPEC 1.006 02/13/2018 1705   PHURINE 7.0 02/13/2018 1705   GLUCOSEU NEGATIVE 02/13/2018 1705   HGBUR NEGATIVE  02/13/2018 1705   [place] 02/13/2018 [place] 02/13/2018 1705   PROTEINUR 30 (A) 02/13/2018 1705   UROBILINOGEN 0.2 07/18/2014 2120   NITRITE NEGATIVE 02/13/2018 1705   LEUKOCYTESUR SMALL (A) 02/13/2018 1705     Imaging Results:    US ABDOMEN COMPLETE W/ELASTOGRAPHY  Result Date: 08/04/2020 CLINICAL DATA:  Abnormal liver function tests. EXAM: ULTRASOUND ABDOMEN ULTRASOUND HEPATIC ELASTOGRAPHY TECHNIQUE: Sonography of the upper abdomen was performed. In addition, ultrasound elastography evaluation of the liver was performed. A region of interest was placed within the right lobe of the liver. Following application of a compressive sonographic pulse, tissue compressibility was assessed. Multiple assessments were performed at the selected site. Median tissue compressibility was determined. Previously, hepatic stiffness was assessed by shear wave velocity. Based on recently published Society of Radiologists in Ultrasound consensus article, reporting is now recommended to be performed in the SI units of pressure (kiloPascals) representing hepatic stiffness/elasticity. The obtained result is compared to the published reference standards. (cACLD = compensated Advanced Chronic Liver Disease) COMPARISON:  CT abdomen 05/03/2019 FINDINGS: ULTRASOUND ABDOMEN Gallbladder: Gallstones measuring up to 1.2 cm in diameter. Sonographic Murphy's sign absent. No significant gallbladder wall thickening. Common bile duct: Diameter: 0.3 cm Liver: Faintly accentuated echogenicity in the liver without definite nodular contours. No focal hepatic lesion identified. Portal vein is patent on color Doppler imaging with normal direction of blood flow towards the liver. IVC: No abnormality visualized. Pancreas: Poor visualization of the pancreatic head and uncinate process, questionable heterogeneity in this region. Spleen: Size and appearance within normal limits. Right Kidney: Length: 9.8 cm. Echogenicity  within normal limits. No solid mass or hydronephrosis visualized. Right renal cysts include a 2.0 by 1.8 by 2.0 cm cyst and a 2.0 by 1.7 by 1.9 cm lower pole cyst, both appear simple. Left Kidney: Length: 8.5 cm. Echogenicity within normal limits. No solid mass or hydronephrosis visualized. Left renal cysts include a 1.0 by 1.1 by 0.9 cm cyst and a separate 1.0 by 0.7 by 0.8 cm cyst, both with reasonably simple characteristics. Abdominal aorta: No aneurysm visualized. Other findings: None. ULTRASOUND HEPATIC ELASTOGRAPHY Device: Siemens Helix VTQ Patient position: Supine Transducer 5C1 Number of measurements: 10 Hepatic segment:  8 Median kPa: 7.2 IQR: 0.8 IQR/Median kPa ratio: 0.1 Data quality:  Good Diagnostic category: < or = 9 kPa: in the absence of other known clinical signs, rules out cACLD The use of hepatic elastography is applicable to patients with viral hepatitis and non-alcoholic fatty liver disease. At this time, there is insufficient data for the referenced cut-off values and use in other causes of liver disease, including alcoholic liver disease. Patients, however, may be assessed by elastography and serve as their own reference standard/baseline. In patients with non-alcoholic liver disease, the values suggesting compensated advanced chronic liver disease (cACLD) may be lower, and patients may need additional testing with elasticity results of 7-9 kPa. Please note that abnormal hepatic elasticity and shear wave velocities may also be identified in clinical settings other than  with hepatic fibrosis, such as: acute hepatitis, elevated right heart and central venous pressures including use of beta blockers, veno-occlusive disease (Budd-Chiari), infiltrative processes such as mastocytosis/amyloidosis/infiltrative tumor/lymphoma, extrahepatic cholestasis, with hyperemia in the post-prandial state, and with liver transplantation. Correlation with patient history, laboratory data, and clinical condition  recommended. Diagnostic Categories: < or =5 kPa: high probability of being normal < or =9 kPa: in the absence of other known clinical signs, rules out cACLD >9 kPa and ?13 kPa: suggestive of cACLD, but needs further testing >13 kPa: highly suggestive of cACLD > or =17 kPa: highly suggestive of cACLD with an increased probability of clinically significant portal hypertension IMPRESSION: ULTRASOUND ABDOMEN: 1. Cholelithiasis without gallbladder wall thickening or sonographic Murphy's sign. 2. Bilateral renal cysts. 3. Poor visualization of the pancreatic head and uncinate process with questionable heterogeneity in this region. Consider pancreatic protocol CT or MRI for further characterization. ULTRASOUND HEPATIC ELASTOGRAPHY: Median kPa:  7.2 Diagnostic category: < or = 9 kPa: in the absence of other known clinical signs, rules out cACLD Electronically Signed   By: Van Clines M.D.   On: 08/04/2020 11:24    Radiological Exams on Admission: US ABDOMEN COMPLETE W/ELASTOGRAPHY  Result Date: 08/04/2020 CLINICAL DATA:  Abnormal liver function tests. EXAM: ULTRASOUND ABDOMEN ULTRASOUND HEPATIC ELASTOGRAPHY TECHNIQUE: Sonography of the upper abdomen was performed. In addition, ultrasound elastography evaluation of the liver was performed. A region of interest was placed within the right lobe of the liver. Following application of a compressive sonographic pulse, tissue compressibility was assessed. Multiple assessments were performed at the selected site. Median tissue compressibility was determined. Previously, hepatic stiffness was assessed by shear wave velocity. Based on recently published Society of Radiologists in Ultrasound consensus article, reporting is now recommended to be performed in the SI units of pressure (kiloPascals) representing hepatic stiffness/elasticity. The obtained result is compared to the published reference standards. (cACLD = compensated Advanced Chronic Liver Disease) COMPARISON:   CT abdomen 05/03/2019 FINDINGS: ULTRASOUND ABDOMEN Gallbladder: Gallstones measuring up to 1.2 cm in diameter. Sonographic Murphy's sign absent. No significant gallbladder wall thickening. Common bile duct: Diameter: 0.3 cm Liver: Faintly accentuated echogenicity in the liver without definite nodular contours. No focal hepatic lesion identified. Portal vein is patent on color Doppler imaging with normal direction of blood flow towards the liver. IVC: No abnormality visualized. Pancreas: Poor visualization of the pancreatic head and uncinate process, questionable heterogeneity in this region. Spleen: Size and appearance within normal limits. Right Kidney: Length: 9.8 cm. Echogenicity within normal limits. No solid mass or hydronephrosis visualized. Right renal cysts include a 2.0 by 1.8 by 2.0 cm cyst and a 2.0 by 1.7 by 1.9 cm lower pole cyst, both appear simple. Left Kidney: Length: 8.5 cm. Echogenicity within normal limits. No solid mass or hydronephrosis visualized. Left renal cysts include a 1.0 by 1.1 by 0.9 cm cyst and a separate 1.0 by 0.7 by 0.8 cm cyst, both with reasonably simple characteristics. Abdominal aorta: No aneurysm visualized. Other findings: None. ULTRASOUND HEPATIC ELASTOGRAPHY Device: Siemens Helix VTQ Patient position: Supine Transducer 5C1 Number of measurements: 10 Hepatic segment:  8 Median kPa: 7.2 IQR: 0.8 IQR/Median kPa ratio: 0.1 Data quality:  Good Diagnostic category: < or = 9 kPa: in the absence of other known clinical signs, rules out cACLD The use of hepatic elastography is applicable to patients with viral hepatitis and non-alcoholic fatty liver disease. At this time, there is insufficient data for the referenced cut-off values and use in other causes of liver  disease, including alcoholic liver disease. Patients, however, may be assessed by elastography and serve as their own reference standard/baseline. In patients with non-alcoholic liver disease, the values suggesting  compensated advanced chronic liver disease (cACLD) may be lower, and patients may need additional testing with elasticity results of 7-9 kPa. Please note that abnormal hepatic elasticity and shear wave velocities may also be identified in clinical settings other than with hepatic fibrosis, such as: acute hepatitis, elevated right heart and central venous pressures including use of beta blockers, veno-occlusive disease (Budd-Chiari), infiltrative processes such as mastocytosis/amyloidosis/infiltrative tumor/lymphoma, extrahepatic cholestasis, with hyperemia in the post-prandial state, and with liver transplantation. Correlation with patient history, laboratory data, and clinical condition recommended. Diagnostic Categories: < or =5 kPa: high probability of being normal < or =9 kPa: in the absence of other known clinical signs, rules out cACLD >9 kPa and ?13 kPa: suggestive of cACLD, but needs further testing >13 kPa: highly suggestive of cACLD > or =17 kPa: highly suggestive of cACLD with an increased probability of clinically significant portal hypertension IMPRESSION: ULTRASOUND ABDOMEN: 1. Cholelithiasis without gallbladder wall thickening or sonographic Murphy's sign. 2. Bilateral renal cysts. 3. Poor visualization of the pancreatic head and uncinate process with questionable heterogeneity in this region. Consider pancreatic protocol CT or MRI for further characterization. ULTRASOUND HEPATIC ELASTOGRAPHY: Median kPa:  7.2 Diagnostic category: < or = 9 kPa: in the absence of other known clinical signs, rules out cACLD Electronically Signed   By: Van Clines M.D.   On: 08/04/2020 11:24    DVT Prophylaxis -SCD/Gi Bleed  AM Labs Ordered, also please review Full Orders  Family Communication: Admission, patients condition and plan of care including tests being ordered have been discussed with the patient  who indicate understanding and agree with the plan   Code Status - Full Code  Likely DC to after  endoluminal evaluation  Condition   stable  Deatra James M.D on 08/06/2020 at 11:10 AM Go to [email or web address] -  for contact info  [facility] - Office  [phone]

## 2020-08-07 LAB — CBC
HCT: 28.6 % — ABNORMAL LOW (ref 36.0–46.0)
Hemoglobin: 9.1 g/dL — ABNORMAL LOW (ref 12.0–15.0)
MCH: 29.5 pg (ref 26.0–34.0)
MCHC: 31.8 g/dL (ref 30.0–36.0)
MCV: 92.9 fL (ref 80.0–100.0)
Platelets: 266 10*3/uL (ref 150–400)
RBC: 3.08 MIL/uL — ABNORMAL LOW (ref 3.87–5.11)
RDW: 18.4 % — ABNORMAL HIGH (ref 11.5–15.5)
WBC: 7.9 10*3/uL (ref 4.0–10.5)
nRBC: 0 % (ref 0.0–0.2)

## 2020-08-07 LAB — GLUCOSE, CAPILLARY
Glucose-Capillary: 154 mg/dL — ABNORMAL HIGH (ref 70–99)
Glucose-Capillary: 165 mg/dL — ABNORMAL HIGH (ref 70–99)

## 2020-08-07 MED ORDER — PANTOPRAZOLE SODIUM 40 MG PO TBEC: 40.0000 mg | DELAYED_RELEASE_TABLET | Freq: Two times a day (BID) | ORAL | 0 refills | Status: DC

## 2020-08-07 MED ORDER — CIPROFLOXACIN HCL 500 MG PO TABS: 500.0000 mg | ORAL_TABLET | Freq: Two times a day (BID) | ORAL | 0 refills | Status: AC

## 2020-08-07 NOTE — Progress Notes (Signed)
Patient discharged today, transported home by daughter. Discharge paperwork went over with patient and daughter, both verbalized understanding. Belongings sent home with patient. Patient stable upon discharge.

## 2020-08-07 NOTE — Discharge Summary (Addendum)
Physician Discharge Summary [facility]    Patient: Beth Padilla                   Admit date: 08/03/2020   DOB: [birth year]             Discharge date:08/07/2020/12:30 PM YTK:[id number]                          PCP: Kendrick Ranch, MD  Disposition: HOME  Recommendations for Outpatient Follow-up:   Follow up: With gastroenterologist Dr. Maylon Peppers, Consider additional imaging of the pancreas as an outpatient,due to CKD imaging options with contrast limited.  Per Dr. Jenetta Downer, he will address at office visit. Needs outpatient follow-up pancreatic lesion, gastric ulcer, cirrhosis. Continue Protonix 40 mg twice a day, may resume Eliquis Per GI may need repeat EGDin  10 to 12 weeks for gastric ulcers Monitor for tarry black stool, frank bleeding Follow-up with PCP 2-3 weeks     Discharge Condition: Stable   Code Status:   Code Status: Full Code  Diet recommendation: Diabetic diet   Discharge Diagnoses:    Principal Problem:   Acute GI bleeding Active Problems:   Type 2 diabetes with nephropathy (HCC)   HTN (hypertension)   Hypothyroidism   Paroxysmal atrial fibrillation (HCC)   Chronic diastolic HF (heart failure) ([place])   Uncontrolled type 2 diabetes mellitus with hyperglycemia ([place])   H/O: CVA (cerebrovascular accident)-- Has Afib   CKD (chronic kidney disease), stage IV (HCC)   Heme positive stool   Black stool   Abnormal CT of liver   History of Present Illness/ Hospital Course Beth Padilla Summary:   Beth Padilla  is a 80 y.o. 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 Cirrhosis on CT April 2021, dCHF who presents to the ED with a hemoglobin of 6.1 after being told to come here by PCP due to low hemoglobin on CBC that was drawn on her  post-hospitalization checkup -Patient complains of fatigue malaise poor appetite and occasional dizzy spells No syncope, no chest pains no shortness of breath Patient was recently treated for CHF at this hospital discharge on 07/26/2020 with hemoglobin of 7.5 at the time -She missed a dose of her Procrit and since discharge from the hospital on 07/26/2020 she has not been taking iron supplementation -Hemoccult is positive in the ED -Last dose of Eliquis was p.m. of 08/02/2020 -INR is 1.4, Hgb 6.1, platelets 344 -Creatinine is 2.0 which is close to patient's baseline BUN is 72 with a GFR of 25 which is close to patient's baseline, bicarb is 22 and anion gap is not elevated    1) acute on chronic anemia most likely due to ABLA--dark stools + heme positive/gastric ulcers -H&H remained stable -on 07/26/2020 Hgb was 7.5 , on admission Hgb 6.1>>> s/p 2U PRBC transfusion,Hgb 7.1, still symptomatic another 2 units 08/04/2020 >> 9.3, 9.2 >> 8.7 >> 9.1 this morning   -She missed a dose of her Procrit and since discharge from the hospital on 07/26/2020 she has not been taking iron supplementation -Holding Eliquis last dose 08/02/2020   -Status post EGD 08/05/2020: Findings: Nonbleeding gastric ulcer duodenitis, congested duodenal mucosa Gastric biopsy. Gastroenterologist recommended clear liquid diet, IV Protonix twice daily monitoring H&H   -CT on this admission revealed nodular  liver possible liver cirrhosis, LFTs within normal limits,, no signs of portal hypertension, -Ultrasound on this admission to find no nodular hepatic -GI considering pancreatic CT with contrast in the future versus EUS (contrast not indicated due to CKD stage IV) -Recommending 7 days of antibiotic for SBP prophylaxis -Resuming Eliquis in 72 hours -Advancing to soft diet -Repeat EGD 10-12 weeks GI has signed off       2)PAFIb--history of prior stroke in 2016- -Very high risk for another stroke, stable  CHA2DS2- VASc score    is =  8 (age x 2, h/o CVA  x2, CHF x1, HTN x 1, gender x 1, DM x 1)   Which is  equal to = 11.2 % annual risk of stroke -Per GI will resume Eliquis     3) CKD stage - IV --Creatinine is 2.0 which is close to patient's baseline BUN is 72, GFR is 25 -Creatinine 2.0, 1.98, -Renal function appears to be close to her baseline,  -renally adjust medications, avoid nephrotoxic agents / dehydration  / hypotension --Remained stable   4)DM 2- -A1c 6.8 reflecting good diabetic control PTA -Continue strict diabetic diet, currently no medication unless   5)HFpEF--history of diastolic CHF -Stable in no respiratory distress -Continue to monitor daily weight, oral fluid intake Per patient Diovan and acetazolamide and Lasix has been on hold--- not taking EF on echo from 07/22/2020 was 55 to 60% Recommending close follow-up with cardiologist regarding restarting above medications   6) anxiety and depression--- comfortable, stable we will, continue Lexapro and buspirone   7) hypothyroidism--- stable continue levothyroxine 100 mcg daily   8) possible liver cirrhosis--- prior CT abdomen and pelvis suggested cirrhosis of the liver -Abdominal ultrasound with Elastoplasty -revealed no nodularity -LFTs within normal limits -Avoid hepatotoxic agent -Rocephin for SBP prophylaxis suggested by GI service-- -Octreotide was discontinued   9)HTN -Elevated BP, restarting home medication -Resuming home medication of  amlodipine 5 mg daily, hydralazine 50 mg 3 times daily, isosorbide 30 mg daily   10) history of prior stroke--- in the setting of A. fib, please see #2 above -Continue Lipitor,  -Eliquis on hold as above #2... We will restart in 72 hours       Dispo: The patient is from: Home              Anticipated d/c is to: Home    Discharge Instructions:   Discharge Instructions     Activity as tolerated - No restrictions   Complete by: As directed    Call MD for:  difficulty breathing, headache  or visual disturbances   Complete by: As directed    Call MD for:  persistant dizziness or light-headedness   Complete by: As directed    Call MD for:  persistant nausea and vomiting   Complete by: As directed    Call MD for:  severe uncontrolled pain   Complete by: As directed    Call MD for:  temperature >100.4   Complete by: As directed    Diet - low sodium heart healthy   Complete by: As directed    Discharge instructions   Complete by: As directed    F/up with gastroenterologist in 1-2 weeks.... Current medication including Protonix twice a day till seen by gastroenterologist   Increase activity slowly   Complete by: As directed         Medication List     STOP taking these medications    acetaZOLAMIDE 250 MG tablet Commonly known  as: DIAMOX   hydrALAZINE 50 MG tablet Commonly known as: APRESOLINE   omeprazole 20 MG capsule Commonly known as: PRILOSEC   traMADol 50 MG tablet Commonly known as: ULTRAM   valsartan 80 MG tablet Commonly known as: DIOVAN       TAKE these medications    acetaminophen 650 MG CR tablet Commonly known as: TYLENOL Take 650 mg by mouth every 8 (eight) hours as needed for pain.   amLODipine 5 MG tablet Commonly known as: NORVASC Take 5 mg by mouth daily. What changed: Another medication with the same name was removed. Continue taking this medication, and follow the directions you see here.   apixaban 2.5 MG Tabs tablet Commonly known as: ELIQUIS Take 2.5 mg by mouth 2 (two) times daily.   atorvastatin 80 MG tablet Commonly known as: LIPITOR Take 1 tablet (80 mg total) by mouth every evening.   busPIRone 10 MG tablet Commonly known as: BUSPAR Take 10 mg by mouth 2 (two) times daily.   ciprofloxacin 500 MG tablet Commonly known as: Cipro Take 1 tablet (500 mg total) by mouth 2 (two) times daily for 3 days.   [employer] Diclofenac Sodium 1 % Gel Generic drug: diclofenac Sodium Apply 2 g topically 4 (four) times daily as needed  (pain).   escitalopram 10 MG tablet Commonly known as: LEXAPRO Take 10 mg by mouth daily.   ferrous sulfate 325 (65 FE) MG tablet Take 1 tablet by mouth 2 (two) times daily.   furosemide 20 MG tablet Commonly known as: LASIX Take 2 tablets (40 mg total) by mouth daily.   gabapentin 300 MG capsule Commonly known as: NEURONTIN Take 1 capsule by mouth daily as needed (nerve pain).   isosorbide dinitrate 30 MG tablet Commonly known as: ISORDIL Take 30 mg by mouth 3 (three) times daily.   levothyroxine 100 MCG tablet Commonly known as: SYNTHROID Take 125 mcg by mouth daily before breakfast.   pantoprazole 40 MG tablet Commonly known as: Protonix Take 1 tablet (40 mg total) by mouth 2 (two) times daily.   sodium bicarbonate 650 MG tablet Take 1,300 mg by mouth 2 (two) times daily.   vitamin A & D ointment Apply 1 application topically as needed for dry skin.        Allergies  Allergen Reactions   Ampicillin Rash     Procedures /Studies:   DG Chest 2 View  Result Date: 07/24/2020 CLINICAL DATA:  Shortness of breath and coughing EXAM: CHEST - 2 VIEW COMPARISON:  07/21/2020 FINDINGS: Patchy bilateral airspace opacity, again favoring pneumonia. Stable cardiomegaly. No visible effusion or pneumothorax. IMPRESSION: Unchanged bilateral pulmonary infiltrates. Electronically Signed   By: Monte Fantasia M.D.   On: 07/24/2020 11:21   DG Chest 2 View  Result Date: 07/21/2020 CLINICAL DATA:  Shortness of breath, COVID positive 06/14/2020 EXAM: CHEST - 2 VIEW COMPARISON:  06/18/2020 FINDINGS: Patchy right lower lobe opacity, progressive from the prior, suspicious for pneumonia. Mild left basilar opacity, similar to the prior, atelectasis versus pneumonia. No pleural effusion or pneumothorax. Cardiomegaly.  Thoracic aortic atherosclerosis. IMPRESSION: Progressive right lower lobe opacity, suspicious for pneumonia. Mild left basilar opacity, atelectasis versus pneumonia. Electronically  Signed   By: Julian Hy M.D.   On: 07/21/2020 19:55   US ABDOMEN COMPLETE W/ELASTOGRAPHY  Result Date: 08/04/2020 CLINICAL DATA:  Abnormal liver function tests. EXAM: ULTRASOUND ABDOMEN ULTRASOUND HEPATIC ELASTOGRAPHY TECHNIQUE: Sonography of the upper abdomen was performed. In addition, ultrasound elastography evaluation of the liver was performed.  A region of interest was placed within the right lobe of the liver. Following application of a compressive sonographic pulse, tissue compressibility was assessed. Multiple assessments were performed at the selected site. Median tissue compressibility was determined. Previously, hepatic stiffness was assessed by shear wave velocity. Based on recently published Society of Radiologists in Ultrasound consensus article, reporting is now recommended to be performed in the SI units of pressure (kiloPascals) representing hepatic stiffness/elasticity. The obtained result is compared to the published reference standards. (cACLD = compensated Advanced Chronic Liver Disease) COMPARISON:  CT abdomen 05/03/2019 FINDINGS: ULTRASOUND ABDOMEN Gallbladder: Gallstones measuring up to 1.2 cm in diameter. Sonographic Murphy's sign absent. No significant gallbladder wall thickening. Common bile duct: Diameter: 0.3 cm Liver: Faintly accentuated echogenicity in the liver without definite nodular contours. No focal hepatic lesion identified. Portal vein is patent on color Doppler imaging with normal direction of blood flow towards the liver. IVC: No abnormality visualized. Pancreas: Poor visualization of the pancreatic head and uncinate process, questionable heterogeneity in this region. Spleen: Size and appearance within normal limits. Right Kidney: Length: 9.8 cm. Echogenicity within normal limits. No solid mass or hydronephrosis visualized. Right renal cysts include a 2.0 by 1.8 by 2.0 cm cyst and a 2.0 by 1.7 by 1.9 cm lower pole cyst, both appear simple. Left Kidney: Length: 8.5  cm. Echogenicity within normal limits. No solid mass or hydronephrosis visualized. Left renal cysts include a 1.0 by 1.1 by 0.9 cm cyst and a separate 1.0 by 0.7 by 0.8 cm cyst, both with reasonably simple characteristics. Abdominal aorta: No aneurysm visualized. Other findings: None. ULTRASOUND HEPATIC ELASTOGRAPHY Device: Siemens Helix VTQ Patient position: Supine Transducer 5C1 Number of measurements: 10 Hepatic segment:  8 Median kPa: 7.2 IQR: 0.8 IQR/Median kPa ratio: 0.1 Data quality:  Good Diagnostic category: < or = 9 kPa: in the absence of other known clinical signs, rules out cACLD The use of hepatic elastography is applicable to patients with viral hepatitis and non-alcoholic fatty liver disease. At this time, there is insufficient data for the referenced cut-off values and use in other causes of liver disease, including alcoholic liver disease. Patients, however, may be assessed by elastography and serve as their own reference standard/baseline. In patients with non-alcoholic liver disease, the values suggesting compensated advanced chronic liver disease (cACLD) may be lower, and patients may need additional testing with elasticity results of 7-9 kPa. Please note that abnormal hepatic elasticity and shear wave velocities may also be identified in clinical settings other than with hepatic fibrosis, such as: acute hepatitis, elevated right heart and central venous pressures including use of beta blockers, veno-occlusive disease (Budd-Chiari), infiltrative processes such as mastocytosis/amyloidosis/infiltrative tumor/lymphoma, extrahepatic cholestasis, with hyperemia in the post-prandial state, and with liver transplantation. Correlation with patient history, laboratory data, and clinical condition recommended. Diagnostic Categories: < or =5 kPa: high probability of being normal < or =9 kPa: in the absence of other known clinical signs, rules out cACLD >9 kPa and ?13 kPa: suggestive of cACLD, but needs  further testing >13 kPa: highly suggestive of cACLD > or =17 kPa: highly suggestive of cACLD with an increased probability of clinically significant portal hypertension IMPRESSION: ULTRASOUND ABDOMEN: 1. Cholelithiasis without gallbladder wall thickening or sonographic Murphy's sign. 2. Bilateral renal cysts. 3. Poor visualization of the pancreatic head and uncinate process with questionable heterogeneity in this region. Consider pancreatic protocol CT or MRI for further characterization. ULTRASOUND HEPATIC ELASTOGRAPHY: Median kPa:  7.2 Diagnostic category: < or = 9 kPa: in the absence  of other known clinical signs, rules out cACLD Electronically Signed   By: Van Clines M.D.   On: 08/04/2020 11:24   ECHOCARDIOGRAM COMPLETE  Result Date: 07/22/2020    ECHOCARDIOGRAM REPORT   Patient Name:   Beth Padilla Date of Exam: 07/22/2020 Medical Rec #:  [id number]          Height:       67.0 in Accession #:    [id number]         Weight:       167.0 lb Date of Birth:  11-30-1940          BSA:          1.873 m Patient Age:    80 years           BP:           204/68 mmHg Patient Gender: F                  HR:           88 bpm. Exam Location:  [facility] Procedure: 2D Echo, Cardiac Doppler and Color Doppler Indications:    CHF Acute Diastolic  History:        Patient has prior history of Echocardiogram examinations, most                 recent 02/14/2018. CHF, Stroke, Arrythmias:Atrial Fibrillation;                 Risk Factors:Hypertension and Diabetes.  Sonographer:    Wenda Low Referring Phys: [id number] [place]  1. Left ventricular ejection fraction, by estimation, is 55 to 60%. The left ventricle has normal function. The left ventricle has no regional wall motion abnormalities. There is mild left ventricular hypertrophy. Left ventricular diastolic parameters are indeterminate.  2. Right ventricular systolic function is mildly reduced. The right ventricular size is mildly enlarged.  Mildly increased right ventricular wall thickness.  3. Left atrial size was moderately dilated.  4. Right atrial size was mildly dilated.  5. Trivial mitral valve regurgitation.  6. AV is thickened, calcified with restricted motion. Peak and mean pressures through the valve are 37 and 20 mm Hg resepctively AVA (VTI) is 1.2 cm2. Dimentionless index is 0.39 consistent with moderate aortic stenosis. Since echo from Jan 2020 there is no significant change.. Aortic valve regurgitation is mild.  7. The inferior vena cava is dilated in size with <50% respiratory variability, suggesting right atrial pressure of 15 mmHg. FINDINGS  Left Ventricle: Left ventricular ejection fraction, by estimation, is 55 to 60%. The left ventricle has normal function. The left ventricle has no regional wall motion abnormalities. The left ventricular internal cavity size was normal in size. There is  mild left ventricular hypertrophy. Left ventricular diastolic parameters are indeterminate. Right Ventricle: The right ventricular size is mildly enlarged. Mildly increased right ventricular wall thickness. Right ventricular systolic function is mildly reduced. Left Atrium: Left atrial size was moderately dilated. Right Atrium: Right atrial size was mildly dilated. Pericardium: Trivial pericardial effusion is present. Mitral Valve: There is mild thickening of the mitral valve leaflet(s). Mild mitral annular calcification. Trivial mitral valve regurgitation. MV peak gradient, 13.5 mmHg. The mean mitral valve gradient is 3.0 mmHg. Tricuspid Valve: The tricuspid valve is normal in structure. Tricuspid valve regurgitation is mild. Aortic Valve: AV is thickened, calcified with restricted motion. Peak and mean pressures through the valve are 37 and 20 mm Hg resepctively AVA (VTI)  is 1.2 cm2. Dimentionless index is 0.39 consistent with moderate aortic stenosis. Since echo from Jan 2020 there is no significant change. Aortic valve regurgitation is mild.  Aortic regurgitation PHT measures 379 msec. Aortic valve mean gradient measures 19.5 mmHg. Aortic valve peak gradient measures 37.0 mmHg. Aortic valve area, by VTI measures 1.12 cm. Pulmonic Valve: The pulmonic valve was grossly normal. Pulmonic valve regurgitation is trivial. Aorta: The aortic root and ascending aorta are structurally normal, with no evidence of dilitation. Venous: The inferior vena cava is dilated in size with less than 50% respiratory variability, suggesting right atrial pressure of 15 mmHg. IAS/Shunts: No atrial level shunt detected by color flow Doppler.  LEFT VENTRICLE PLAX 2D LVIDd:         4.26 cm  Diastology LVIDs:         3.07 cm  LV e' medial:    8.19 cm/s LV PW:         1.48 cm  LV E/e' medial:  19.3 LV IVS:        0.98 cm  LV e' lateral:   9.89 cm/s LVOT diam:     1.90 cm  LV E/e' lateral: 16.0 LV SV:         86 LV SV Index:   46 LVOT Area:     2.84 cm  RIGHT VENTRICLE RV Basal diam:  5.21 cm RV Mid diam:    3.21 cm RV S prime:     9.81 cm/s TAPSE (M-mode): 2.8 cm LEFT ATRIUM              Index       RIGHT ATRIUM           Index LA diam:        5.40 cm  2.88 cm/m  RA Area:     20.70 cm LA Vol (A2C):   118.0 ml 63.00 ml/m RA Volume:   66.60 ml  35.56 ml/m LA Vol (A4C):   81.7 ml  43.62 ml/m LA Biplane Vol: 105.0 ml 56.06 ml/m  AORTIC VALVE AV Area (Vmax):    1.18 cm AV Area (Vmean):   1.18 cm AV Area (VTI):     1.12 cm AV Vmax:           304.00 cm/s AV Vmean:          202.500 cm/s AV VTI:            0.765 m AV Peak Grad:      37.0 mmHg AV Mean Grad:      19.5 mmHg LVOT Vmax:         127.00 cm/s LVOT Vmean:        84.400 cm/s LVOT VTI:          0.302 m LVOT/AV VTI ratio: 0.39 AI PHT:            379 msec  AORTA Ao Root diam: 3.30 cm Ao Asc diam:  3.80 cm MITRAL VALVE                TRICUSPID VALVE MV Area (PHT): 3.83 cm     TR Peak grad:   38.4 mmHg MV Area VTI:   1.97 cm     TR Vmax:        310.00 cm/s MV Peak grad:  13.5 mmHg MV Mean grad:  3.0 mmHg     SHUNTS MV Vmax:        1.84 m/s     Systemic VTI:  0.30 m MV Vmean:      71.7 cm/s    Systemic Diam: 1.90 cm MV Decel Time: 198 msec MV E velocity: 158.00 cm/s MV A velocity: 64.00 cm/s MV E/A ratio:  2.47 Dorris Carnes MD Electronically signed by Dorris Carnes MD Signature Date/Time: 07/22/2020/5:33:45 PM    Final     Subjective:   Patient was seen and examined 08/07/2020, 12:30 PM Patient stable today. No acute distress.  No issues overnight Stable for discharge.  Discharge Exam:    Vitals:   08/06/20 2036 08/06/20 2131 08/07/20 0625 08/07/20 0912  BP: (!) 131/58 131/62 (!) 146/63 140/64  Pulse: 71  72   Resp: 18  18   Temp: 99.1 F (37.3 C)  98.4 F (36.9 C)   TempSrc: Oral  Oral   SpO2: 100%  100%   Weight:      Height:        General: Pt lying comfortably in bed & appears in no obvious distress. Cardiovascular: S1 & S2 heard, RRR, S1/S2 +. No murmurs, rubs, gallops or clicks. No JVD or pedal edema. Respiratory: Clear to auscultation without wheezing, rhonchi or crackles. No increased work of breathing. Abdominal:  Non-distended, non-tender & soft. No organomegaly or masses appreciated. Normal bowel sounds heard. CNS: Alert and oriented. No focal deficits. Extremities: no edema, no cyanosis      The results of significant diagnostics from this hospitalization (including imaging, microbiology, ancillary and laboratory) are listed below for reference.      Microbiology:   Recent Results (from the past 240 hour(s))  SARS CORONAVIRUS 2 (TAT 6-24 HRS) Nasopharyngeal Nasopharyngeal Swab     Status: None   Collection Time: 08/03/20  8:07 PM   Specimen: Nasopharyngeal Swab  Result Value Ref Range Status   SARS Coronavirus 2 NEGATIVE NEGATIVE Final    Comment: (NOTE) SARS-CoV-2 target nucleic acids are NOT DETECTED.  The SARS-CoV-2 RNA is generally detectable in upper and lower respiratory specimens during the acute phase of infection. Negative results do not preclude SARS-CoV-2 infection, do not rule  out co-infections with other pathogens, and should not be used as the sole basis for treatment or other patient management decisions. Negative results must be combined with clinical observations, patient history, and epidemiological information. The expected result is Negative.  Fact Sheet for Patients: SugarRoll.be  Fact Sheet for Healthcare Providers: https://www.woods-mathews.com/  This test is not yet approved or cleared by the [place] FDA and  has been authorized for detection and/or diagnosis of SARS-CoV-2 by FDA under an Emergency Use Authorization (EUA). This EUA will remain  in effect (meaning this test can be used) for the duration of the COVID-19 declaration under Se ction 564(b)(1) of the Act, 21 U.S.C. section 360bbb-3(b)(1), unless the authorization is terminated or revoked sooner.  Performed at [facility], [place] [address] [place], [place] [address]      Labs:   CBC: Recent Labs  Lab 08/03/20 1321 08/04/20 0647 08/04/20 2103 08/05/20 0455 08/06/20 0455 08/07/20 0435  WBC 8.1 8.0  --  8.8 7.6 7.9  NEUTROABS 6.9  --   --   --   --   --   HGB 6.1* 7.1* 9.3* 9.2* 8.7* 9.1*  HCT 20.8* 22.1* 28.0* 28.7* 27.2* 28.6*  MCV 91.6 87.7  --  88.9 91.0 92.9  PLT 344 244  --  250 238 622   Basic Metabolic Panel: Recent Labs  Lab 08/03/20 1321 08/04/20 0647  NA 134* 135  K 4.7 4.7  CL 103 106  CO2 22 23  GLUCOSE 195* 190*  BUN 72* 74*  CREATININE 2.00* 1.98*  CALCIUM 8.1* 7.2*   Liver Function Tests: Recent Labs  Lab 08/03/20 1718 08/04/20 0647  AST 21 15  ALT 16 15  ALKPHOS 44 38  BILITOT 0.4 0.7  PROT 6.5 5.5*  ALBUMIN 2.6* 2.3*   BNP (last 3 results) Recent Labs    07/21/20 2258  BNP 1,321.0*   Cardiac Enzymes: No results for input(s): CKTOTAL, CKMB, CKMBINDEX, TROPONINI in the last 168 hours. CBG: Recent Labs  Lab 08/06/20 1116 08/06/20 1612 08/06/20 2035 08/07/20 0741  08/07/20 1157  GLUCAP 216* 194* 227* 154* 165*   Hgb A1c No results for input(s): HGBA1C in the last 72 hours. Lipid Profile No results for input(s): CHOL, HDL, LDLCALC, TRIG, CHOLHDL, LDLDIRECT in the last 72 hours. Thyroid function studies No results for input(s): TSH, T4TOTAL, T3FREE, THYROIDAB in the last 72 hours.  Invalid input(s): FREET3 Anemia work up No results for input(s): VITAMINB12, FOLATE, FERRITIN, TIBC, IRON, RETICCTPCT in the last 72 hours. Urinalysis    Component Value Date/Time   COLORURINE STRAW (A) 02/13/2018 [place] 02/13/2018 1705   LABSPEC 1.006 02/13/2018 1705   PHURINE 7.0 02/13/2018 1705   GLUCOSEU NEGATIVE 02/13/2018 1705   HGBUR NEGATIVE 02/13/2018 1705   BILIRUBINUR NEGATIVE 02/13/2018 [place] 02/13/2018 1705   PROTEINUR 30 (A) 02/13/2018 1705   UROBILINOGEN 0.2 07/18/2014 2120   NITRITE NEGATIVE 02/13/2018 1705   LEUKOCYTESUR SMALL (A) 02/13/2018 1705         Time coordinating discharge: Over 45 minutes  SIGNED: Deatra James, MD, FACP, FHM. [facility],  Please use [email or web address] to Page If 7PM-7AM, please contact night-coverage [email or web address] 08/07/2020, 12:30 PM

## 2020-08-07 NOTE — TOC Transition Note (Signed)
Transition of Care [facility]) - CM/SW Discharge Note   Patient Details  Name: Beth Padilla MRN: [id number] Date of Birth: August 30, 1940  Transition of Care [facility]) CM/SW Contact:  Natasha Bence, LCSW Phone Number: 08/07/2020, 1:21 PM   Clinical Narrative:    CSW notified of patient's readiness for discharge. CSW faxed discharge summary and [place] orders to [facility]. TOC signing off.   Final next level of care: Home w Home Health Services Barriers to Discharge: Barriers Resolved   Patient Goals and CMS Choice Patient states their goals for this hospitalization and ongoing recovery are:: return home   Choice offered to / list presented to : Patient  Discharge Placement                    Patient and family notified of of transfer: 08/07/20  Discharge Plan and Services In-house Referral: Clinical Social Work   Post Acute Care Choice: Resumption of Svcs/PTA Provider          DME Arranged: N/A         HH Arranged: RN, PT, Social Work, OT [place] Agency: [place] Date HH Agency Contacted: 08/07/20 Time [place]: 1321 Representative spoke with at [place]: Faxed  Social Determinants of Health ([place]) Interventions     Readmission Risk Interventions Readmission Risk Prevention Plan 08/04/2020 07/23/2020  Transportation Screening Complete Complete  HRI or [place] - Complete  Social Work Consult for [place] Planning/Counseling - Complete  Palliative Care Screening - Not Applicable  Medication Review Press photographer) Complete Complete  HRI or Home Care Consult Complete -  SW Recovery Care/Counseling Consult Complete -  Palliative Care Screening Not Applicable -  [place] Not Applicable -  Some recent data might be hidden

## 2020-08-09 LAB — SURGICAL PATHOLOGY

## 2020-08-18 ENCOUNTER — Telehealth: Payer: Self-pay | Admitting: *Deleted

## 2020-08-18 ENCOUNTER — Ambulatory Visit (INDEPENDENT_AMBULATORY_CARE_PROVIDER_SITE_OTHER): Payer: Medicare Other | Admitting: Gastroenterology

## 2020-08-18 ENCOUNTER — Other Ambulatory Visit (INDEPENDENT_AMBULATORY_CARE_PROVIDER_SITE_OTHER): Payer: Self-pay

## 2020-08-18 ENCOUNTER — Telehealth (INDEPENDENT_AMBULATORY_CARE_PROVIDER_SITE_OTHER): Payer: Self-pay

## 2020-08-18 ENCOUNTER — Encounter (INDEPENDENT_AMBULATORY_CARE_PROVIDER_SITE_OTHER): Payer: Self-pay

## 2020-08-18 ENCOUNTER — Encounter (INDEPENDENT_AMBULATORY_CARE_PROVIDER_SITE_OTHER): Payer: Self-pay | Admitting: Gastroenterology

## 2020-08-18 ENCOUNTER — Other Ambulatory Visit: Payer: Self-pay

## 2020-08-18 VITALS — BP 176/71 | HR 86 | Temp 99.0°F | Ht 67.0 in | Wt 170.0 lb

## 2020-08-18 DIAGNOSIS — D638 Anemia in other chronic diseases classified elsewhere: Secondary | ICD-10-CM

## 2020-08-18 DIAGNOSIS — K922 Gastrointestinal hemorrhage, unspecified: Secondary | ICD-10-CM | POA: Diagnosis not present

## 2020-08-18 DIAGNOSIS — R1013 Epigastric pain: Secondary | ICD-10-CM | POA: Diagnosis not present

## 2020-08-18 DIAGNOSIS — Q453 Other congenital malformations of pancreas and pancreatic duct: Secondary | ICD-10-CM

## 2020-08-18 DIAGNOSIS — K862 Cyst of pancreas: Secondary | ICD-10-CM

## 2020-08-18 DIAGNOSIS — K5904 Chronic idiopathic constipation: Secondary | ICD-10-CM

## 2020-08-18 DIAGNOSIS — K5900 Constipation, unspecified: Secondary | ICD-10-CM | POA: Insufficient documentation

## 2020-08-18 DIAGNOSIS — K254 Chronic or unspecified gastric ulcer with hemorrhage: Secondary | ICD-10-CM

## 2020-08-18 MED ORDER — DICYCLOMINE HCL 10 MG PO CAPS: 10.0000 mg | ORAL_CAPSULE | Freq: Two times a day (BID) | ORAL | 2 refills | Status: DC | PRN

## 2020-08-18 MED ORDER — PANTOPRAZOLE SODIUM 40 MG PO TBEC: 40.0000 mg | DELAYED_RELEASE_TABLET | Freq: Two times a day (BID) | ORAL | 0 refills | Status: DC

## 2020-08-18 NOTE — Telephone Encounter (Signed)
   [facility] Pre-operative Risk Assessment    Patient Name: Beth Padilla  DOB: 1940-06-16 MRN: [id number]  HEARTCARE STAFF:  - IMPORTANT!!!!!! Under Visit Info/Reason for Call, type in Other and utilize the format Clearance MM/DD/YY or Clearance TBD. Do not use dashes or single digits. - Please review there is not already an duplicate clearance open for this procedure. - If request is for dental extraction, please clarify the # of teeth to be extracted. - If the patient is currently at the dentist's office, call Pre-Op Callback Staff (MA/nurse) to input urgent request.  - If the patient is not currently in the dentist office, please route to the Pre-Op pool.  Request for surgical clearance:  What type of surgery is being performed? EGD/UPPER ENDOSCOPY  When is this surgery scheduled? 10/19/20  What type of clearance is required (medical clearance vs. Pharmacy clearance to hold med vs. Both)? MEDICAL  Are there any medications that need to be held prior to surgery and how long? PLAVIX x 5 DAYS PRIOR TO PROCEDURE  Practice name and name of physician performing surgery? [facility]; DR. Maylon Peppers  What is the office phone number? [phone]   7.   What is the office fax number? [phone]  8.   Anesthesia type (None, local, MAC, general) ? MAC   Julaine Hua 08/18/2020, 5:25 PM  _________________________________________________________________   (provider comments below)

## 2020-08-18 NOTE — Patient Instructions (Addendum)
Perform blood workup Schedule MRCP Schedule EGD in 8-10 weeks Start Bentyl 1 tablet q12h as needed for abdominal pain Start taking Miralax 1 capful every day for one week. If bowel movements do not improve, increase to 1 capful every 12 hours. If after two weeks there is no improvement, increase to 1 capful every 8 hours

## 2020-08-18 NOTE — Progress Notes (Signed)
Maylon Peppers, M.D. Gastroenterology & Hepatology [facility] [address] [place], [place] [address]  Primary Care Physician: Kendrick Ranch, MD [address] [place] [address]  I will communicate my assessment and recommendations to the referring MD via EMR.  Problems: Gastric ulcer Symptomatic anemia secondary to ulcer Abdominal pain Pancreatic head abnormality  History of Present Illness: Beth Padilla is a 80 year old female with past medical history of CKD, A. fib on Eliquis, moderate aortic stenosis, History stroke, diabetes, hypertension, hyperlipidemia, hypothyroidism and heart failure, who comes to the office for follow-up of anemia and after hospitalization for upper gastrointestinal bleed due to gastric ulcer.  The patient was admitted to the hospital on 08/03/2020.  She presented worsening anemia as outpatient and was advised to come to the hospital for further evaluation as she had presented persistent anemia despite taking oral iron.  She was found to have hemoglobin of 6.1 and had positive FOBT.  Notably, a CT of the abdomen in the past had shown presence of liver nodularity.  Upper gastrointestinal investigation showed presence of a normal esophagus, there was diffuse erythema in the stomach with presence of a nonbleeding cratered ulcer in the stomach with clean ulcer base in the antrum.  Biopsies were negative for H. pylori or dysplasia.  There was also presence of duodenitis. Notably, the patient had a liver elastography that showed a low KPa of 7.2 (ratio 0.1) which was inconsistent with advanced liver disease -this test was ordered and the patient had nodularity in liver imaging.  This imaging method also showed presence of heterogeneity in the pancreatic head and uncinate process with recommendation of repeat imaging methods to evaluate the pancreatic head.  Patient left the hospital and reports  feeling well.  She states that she was discharged on ciprofloxacin and after taking the medication she has felt some episodes of abdominal cramping in her epigastric area. She states that she has presented some hard stools on a daily basis which has bothered her up to some point. Denies any nausea or vomiting, no melena or hematochezia.  Denies having any lightheadedness or dizziness, weight changes, abdominal distention or any other complaints.  Denies NSAID intake.  Last EGD: As above Last Colonoscopy: reported normal colonoscopy in [place], unclear when  Past Medical History: Past Medical History:  Diagnosis Date   Anemia    Atrial fibrillation ([place])    Depression    Diabetes mellitus    Hyperlipidemia 12/15/2010   Hypertension    Hypothyroidism 12/15/2010   Pneumonia    Renal disorder     Past Surgical History: Past Surgical History:  Procedure Laterality Date   ABDOMINAL HYSTERECTOMY     BIOPSY  08/05/2020   Procedure: BIOPSY;  Surgeon: [name], DO;  [facility];  Service: Endoscopy;;   EP IMPLANTABLE DEVICE N/A 07/21/2014   Procedure: Loop Recorder Insertion;  Surgeon: [name], MD;  Location: [facility];  Service: Cardiovascular;  Laterality: N/A;   ESOPHAGOGASTRODUODENOSCOPY (EGD) WITH PROPOFOL N/A 08/05/2020   Procedure: ESOPHAGOGASTRODUODENOSCOPY (EGD) WITH PROPOFOL;  Surgeon: [name], DO;  Location[facility];  Service: Endoscopy;  Laterality: N/A;   implantable loop recorder removal  10/14/2018   MDT Reveal LINQ removed in office by Dr [name]   TEE WITHOUT CARDIOVERSION N/A 07/21/2014   Procedure: TRANSESOPHAGEAL ECHOCARDIOGRAM (TEE);  Surgeon: [name], MD;  Location: [facility];  Service: Cardiovascular;  Laterality: N/A;    Family  History: Family History  Problem Relation Age of Onset   Stroke Mother    Cancer Sister     Social History: Social History   Tobacco Use  Smoking Status Never  Smokeless  Tobacco Never   Social History   Substance and Sexual Activity  Alcohol Use No   Social History   Substance and Sexual Activity  Drug Use No    Allergies: Allergies  Allergen Reactions   Ampicillin Rash    Medications: Current Outpatient Medications  Medication Sig Dispense Refill   acetaminophen (TYLENOL) 650 MG CR tablet Take 650 mg by mouth every 8 (eight) hours as needed for pain.     amLODipine (NORVASC) 5 MG tablet Take 5 mg by mouth daily.     apixaban (ELIQUIS) 2.5 MG TABS tablet Take 2.5 mg by mouth 2 (two) times daily.     atorvastatin (LIPITOR) 80 MG tablet Take 1 tablet (80 mg total) by mouth every evening. 30 tablet 2   busPIRone (BUSPAR) 10 MG tablet Take 10 mg by mouth 2 (two) times daily.     [employer] DICLOFENAC SODIUM 1 % GEL Apply 2 g topically 4 (four) times daily as needed (pain).     dicyclomine (BENTYL) 10 MG capsule Take 1 capsule (10 mg total) by mouth every 12 (twelve) hours as needed (abdominal pain). 120 capsule 2   escitalopram (LEXAPRO) 10 MG tablet Take 10 mg by mouth daily.     ferrous sulfate 325 (65 FE) MG tablet Take 1 tablet by mouth 2 (two) times daily.     furosemide (LASIX) 20 MG tablet Take 2 tablets (40 mg total) by mouth daily.     gabapentin (NEURONTIN) 300 MG capsule Take 1 capsule by mouth daily as needed (nerve pain).     isosorbide dinitrate (ISORDIL) 30 MG tablet Take 30 mg by mouth 3 (three) times daily.      levothyroxine (SYNTHROID, LEVOTHROID) 100 MCG tablet Take 125 mcg by mouth daily before breakfast.     pantoprazole (PROTONIX) 40 MG tablet Take 1 tablet (40 mg total) by mouth 2 (two) times daily. 30 tablet 0   Vitamins A & D (VITAMIN A & D) ointment Apply 1 application topically as needed for dry skin.     sodium bicarbonate 650 MG tablet Take 1,300 mg by mouth 2 (two) times daily.     No current facility-administered medications for this visit.    Review of Systems: GENERAL: negative for malaise, night sweats HEENT: No  changes in hearing or vision, no nose bleeds or other nasal problems. NECK: Negative for lumps, goiter, pain and significant neck swelling RESPIRATORY: Negative for cough, wheezing CARDIOVASCULAR: Negative for chest pain, leg swelling, palpitations, orthopnea GI: SEE HPI MUSCULOSKELETAL: Negative for joint pain or swelling, back pain, and muscle pain. SKIN: Negative for lesions, rash PSYCH: Negative for sleep disturbance, mood disorder and recent psychosocial stressors. HEMATOLOGY Negative for prolonged bleeding, bruising easily, and swollen nodes. ENDOCRINE: Negative for cold or heat intolerance, polyuria, polydipsia and goiter. NEURO: negative for tremor, gait imbalance, syncope and seizures. The remainder of the review of systems is noncontributory.   Physical Exam: BP (!) 176/71 (BP Location: Left Arm, Patient Position: Sitting, Cuff Size: Large)   Pulse 86   Temp 99 F (37.2 C) (Oral)   Ht 5\' 7"  (1.702 m)   Wt 170 lb (77.1 kg)   BMI 26.63 kg/m  GENERAL: The patient is AO x3, in no acute distress. Sitting in wheelchair. Looks frail. HEENT:  Head is normocephalic and atraumatic. EOMI are intact. Mouth is well hydrated and without lesions. NECK: Supple. No masses LUNGS: Clear to auscultation. No presence of rhonchi/wheezing/rales. Adequate chest expansion HEART: RRR, normal s1 and s2. ABDOMEN: epigastric tenderness, no guarding, no peritoneal signs, and nondistended. BS +. No masses. EXTREMITIES: Without any cyanosis, clubbing, rash, lesions or edema. NEUROLOGIC: AOx3, no focal motor deficit. SKIN: no jaundice, no rashes  Imaging/Labs: as above  I personally reviewed and interpreted the available labs, imaging and endoscopic files.  Impression and Plan: DORALYN KIRKES is a 80 year old female with past medical history of CKD, A. fib on Eliquis, moderate aortic stenosis, History stroke, diabetes, hypertension, hyperlipidemia, hypothyroidism and heart failure, who comes to  the office for follow-up of anemia and after hospitalization for upper gastrointestinal bleed due to gastric ulcer.  The patient has presented hemodynamic instability after recent admission to the hospital.  It is unclear why she developed a gastric ulcer, as biopsies were negative for H. pylori.  I will check H. pylori serology as she was on PPI when these samples were checked and will treat if she is positive.  I reinforced the importance of avoiding NSAIDs but she denies taking these type of medications.  She will need to have a repeat EGD in 8 to 10 weeks to evaluate if the ulcer has completely healed.  We will need to continue on PPI twice daily until her next endoscopy but will definitely need longstanding pantoprazole once a day after the endoscopy is performed.  It is unclear why she has presented with epigastric abdominal pain but no upper sources were found during the most recent endoscopic evaluation, will manage symptomatically with Bentyl as needed.  In terms of her anemia, I will check her iron stores and a repeat CBC, will consider performing a colonoscopy if this is stores are still low and her hemoglobin has declined. In terms of her constipation, she has presented decreased evacuation which may improve with the use of MiraLAX. Finally, she had unclear findings on her most recent elastography with presence of changes in her pancreas that will need to be further evaluated with cross-sectional abdominal imaging.  Given her kidney disease we will proceed with an MRCP with IV contrast to evaluate this further.  -Check CBC, iron studies, and H. Pylori serology -Schedule MRCP -Schedule EGD in 8-10 weeks -Start Bentyl 1 tablet q12h as needed for abdominal pain - Continue pantoprazole 40 mg twice a day for 2 more months -Start taking Miralax 1 capful every day for one week. If bowel movements do not improve, increase to 1 capful every 12 hours. If after two weeks there is no improvement, increase  to 1 capful every 8 hours - Will consider performing colonoscopy if persistent anemia  All questions were answered.      Beth Quale, MD Gastroenterology and Hepatology [facility]

## 2020-08-18 NOTE — Telephone Encounter (Signed)
Beth Padilla, CMA  

## 2020-08-19 ENCOUNTER — Encounter (INDEPENDENT_AMBULATORY_CARE_PROVIDER_SITE_OTHER): Payer: Self-pay

## 2020-08-19 LAB — IRON, TOTAL/TOTAL IRON BINDING CAP
%SAT: 2 % (calc) — ABNORMAL LOW (ref 16–45)
Iron: <10 ug/dL — ABNORMAL LOW (ref 45–160)
TIBC: 227 mcg/dL (calc) — ABNORMAL LOW (ref 250–450)

## 2020-08-19 LAB — CBC WITH DIFFERENTIAL/PLATELET
Absolute Monocytes: 664 cells/uL (ref 200–950)
Basophils Absolute: 25 cells/uL (ref 0–200)
Basophils Relative: 0.2 %
Eosinophils Absolute: 25 cells/uL (ref 15–500)
Eosinophils Relative: 0.2 %
HCT: 27.9 % — ABNORMAL LOW (ref 35.0–45.0)
Hemoglobin: 8.7 g/dL — ABNORMAL LOW (ref 11.7–15.5)
Lymphs Abs: 677 cells/uL — ABNORMAL LOW (ref 850–3900)
MCH: 28.2 pg (ref 27.0–33.0)
MCHC: 31.2 g/dL — ABNORMAL LOW (ref 32.0–36.0)
MCV: 90.6 fL (ref 80.0–100.0)
MPV: 11.7 fL (ref 7.5–12.5)
Monocytes Relative: 5.4 %
Neutro Abs: 10910 cells/uL — ABNORMAL HIGH (ref 1500–7800)
Neutrophils Relative %: 88.7 %
Platelets: 261 10*3/uL (ref 140–400)
RBC: 3.08 10*6/uL — ABNORMAL LOW (ref 3.80–5.10)
RDW: 17.1 % — ABNORMAL HIGH (ref 11.0–15.0)
Total Lymphocyte: 5.5 %
WBC: 12.3 10*3/uL — ABNORMAL HIGH (ref 3.8–10.8)

## 2020-08-19 LAB — FERRITIN: Ferritin: 156 ng/mL (ref 16–288)

## 2020-08-19 NOTE — Telephone Encounter (Signed)
   Name: Beth Padilla  DOB: 12/24/1940  MRN: [id number]  Primary Cardiologist: None  Chart reviewed as part of pre-operative protocol coverage. Because of Beth Padilla's past medical history and time since last visit, she will require a follow-up visit in order to better assess preoperative cardiovascular risk. Also noted, this patient is not on Plavix as the request is to hold Plavix 5 days prior to procedure.   Pre-op covering staff: -Can we clarify what exactly needs to be held for this patient? It does not appear that the patient is on Plavix at this time.  -She has not been seen in follow up since 2020 therefore she will need a follow up appointment prior to clearance. -Please contact requesting surgeon's office via preferred method (i.e, phone, fax) to inform them of need for appointment prior to surgery.  If applicable, this message will also be routed to pharmacy pool and/or primary cardiologist for input on holding anticoagulant/antiplatelet agent as requested below so that this information is available to the clearing provider at time of patient's appointment.   Kathyrn Drown, NP  08/19/2020, 7:55 AM

## 2020-08-19 NOTE — Telephone Encounter (Signed)
Spoke with Darius Bump from [place], she confirms that pt will need to hold Eliquis X's 2 days prior to her procedure.

## 2020-08-19 NOTE — Telephone Encounter (Signed)
Have sent to [place], [place] scheduler for an appointment.  Will route back to the requesting surgeon's office to make them aware that pt will need to be seen before clearance can be given.

## 2020-08-20 NOTE — Telephone Encounter (Signed)
Pt is scheduled to see Oda Kilts, NP, for surgical clearance, 09/07/2020.  Will route back to the requesting surgeon's office to make them aware.

## 2020-08-20 NOTE — Telephone Encounter (Signed)
Patient with diagnosis of atrial fibrillation on Eliquis for anticoagulation.    Procedure: EGD, upper endoscopy Date of procedure: 10/19/20   CHA2DS2-VASc Score = 8  This indicates a 10.8% annual risk of stroke. The patient's score is based upon: CHF History: Yes HTN History: Yes Diabetes History: Yes Stroke History: Yes Vascular Disease History: No Age Score: 2 Gender Score: 1   Per chart notes, stroke occurred 07/2014 with no residual numbness, weakness or changes in activities of daily living  CrCl 24 (using adjusted body weight) Platelet count 261  GI is asking for 2 day hold on Eliquis prior to procedure.  Would recommend that this is acceptable, and would not recommend a Lovenox bridge despite high CHADS2-VASc score, as patient has recent GI bleed.  Will have primary cardiologist review for final decision.

## 2020-09-01 ENCOUNTER — Ambulatory Visit (HOSPITAL_COMMUNITY): Payer: Medicare Other

## 2020-09-02 ENCOUNTER — Other Ambulatory Visit: Payer: Self-pay

## 2020-09-02 ENCOUNTER — Emergency Department (HOSPITAL_COMMUNITY): Payer: Medicare Other

## 2020-09-02 ENCOUNTER — Inpatient Hospital Stay (HOSPITAL_COMMUNITY)
Admission: EM | Admit: 2020-09-02 | Discharge: 2020-09-05 | DRG: 811 | Disposition: A | Discharge status: Home Health | Payer: Medicare Other | Attending: Family Medicine | Admitting: Family Medicine

## 2020-09-02 ENCOUNTER — Encounter (HOSPITAL_COMMUNITY): Payer: Self-pay

## 2020-09-02 DIAGNOSIS — R0602 Shortness of breath: Secondary | ICD-10-CM | POA: Diagnosis present

## 2020-09-02 DIAGNOSIS — Z515 Encounter for palliative care: Secondary | ICD-10-CM

## 2020-09-02 DIAGNOSIS — E785 Hyperlipidemia, unspecified: Secondary | ICD-10-CM | POA: Diagnosis present

## 2020-09-02 DIAGNOSIS — D500 Iron deficiency anemia secondary to blood loss (chronic): Secondary | ICD-10-CM | POA: Diagnosis present

## 2020-09-02 DIAGNOSIS — K921 Melena: Secondary | ICD-10-CM | POA: Diagnosis present

## 2020-09-02 DIAGNOSIS — I5033 Acute on chronic diastolic (congestive) heart failure: Secondary | ICD-10-CM | POA: Diagnosis present

## 2020-09-02 DIAGNOSIS — D62 Acute posthemorrhagic anemia: Principal | ICD-10-CM | POA: Diagnosis present

## 2020-09-02 DIAGNOSIS — Z8673 Personal history of transient ischemic attack (TIA), and cerebral infarction without residual deficits: Secondary | ICD-10-CM

## 2020-09-02 DIAGNOSIS — Z79899 Other long term (current) drug therapy: Secondary | ICD-10-CM | POA: Diagnosis not present

## 2020-09-02 DIAGNOSIS — K254 Chronic or unspecified gastric ulcer with hemorrhage: Secondary | ICD-10-CM

## 2020-09-02 DIAGNOSIS — D649 Anemia, unspecified: Secondary | ICD-10-CM | POA: Diagnosis not present

## 2020-09-02 DIAGNOSIS — I5032 Chronic diastolic (congestive) heart failure: Secondary | ICD-10-CM | POA: Diagnosis not present

## 2020-09-02 DIAGNOSIS — Z809 Family history of malignant neoplasm, unspecified: Secondary | ICD-10-CM

## 2020-09-02 DIAGNOSIS — E1122 Type 2 diabetes mellitus with diabetic chronic kidney disease: Secondary | ICD-10-CM | POA: Diagnosis present

## 2020-09-02 DIAGNOSIS — Z90710 Acquired absence of both cervix and uterus: Secondary | ICD-10-CM

## 2020-09-02 DIAGNOSIS — Z7189 Other specified counseling: Secondary | ICD-10-CM

## 2020-09-02 DIAGNOSIS — I130 Hypertensive heart and chronic kidney disease with heart failure and stage 1 through stage 4 chronic kidney disease, or unspecified chronic kidney disease: Secondary | ICD-10-CM | POA: Diagnosis present

## 2020-09-02 DIAGNOSIS — J9601 Acute respiratory failure with hypoxia: Secondary | ICD-10-CM | POA: Diagnosis present

## 2020-09-02 DIAGNOSIS — E1121 Type 2 diabetes mellitus with diabetic nephropathy: Secondary | ICD-10-CM | POA: Diagnosis present

## 2020-09-02 DIAGNOSIS — I509 Heart failure, unspecified: Secondary | ICD-10-CM

## 2020-09-02 DIAGNOSIS — I480 Paroxysmal atrial fibrillation: Secondary | ICD-10-CM | POA: Diagnosis present

## 2020-09-02 DIAGNOSIS — Z8616 Personal history of COVID-19: Secondary | ICD-10-CM

## 2020-09-02 DIAGNOSIS — Z7901 Long term (current) use of anticoagulants: Secondary | ICD-10-CM

## 2020-09-02 DIAGNOSIS — K2970 Gastritis, unspecified, without bleeding: Secondary | ICD-10-CM | POA: Diagnosis present

## 2020-09-02 DIAGNOSIS — D509 Iron deficiency anemia, unspecified: Secondary | ICD-10-CM | POA: Diagnosis not present

## 2020-09-02 DIAGNOSIS — F419 Anxiety disorder, unspecified: Secondary | ICD-10-CM | POA: Diagnosis present

## 2020-09-02 DIAGNOSIS — E039 Hypothyroidism, unspecified: Secondary | ICD-10-CM | POA: Diagnosis present

## 2020-09-02 DIAGNOSIS — I639 Cerebral infarction, unspecified: Secondary | ICD-10-CM | POA: Diagnosis present

## 2020-09-02 DIAGNOSIS — I350 Nonrheumatic aortic (valve) stenosis: Secondary | ICD-10-CM | POA: Diagnosis present

## 2020-09-02 DIAGNOSIS — N179 Acute kidney failure, unspecified: Secondary | ICD-10-CM | POA: Diagnosis present

## 2020-09-02 DIAGNOSIS — Z79890 Hormone replacement therapy (postmenopausal): Secondary | ICD-10-CM

## 2020-09-02 DIAGNOSIS — K7460 Unspecified cirrhosis of liver: Secondary | ICD-10-CM | POA: Diagnosis present

## 2020-09-02 DIAGNOSIS — D631 Anemia in chronic kidney disease: Secondary | ICD-10-CM | POA: Diagnosis present

## 2020-09-02 DIAGNOSIS — N184 Chronic kidney disease, stage 4 (severe): Secondary | ICD-10-CM | POA: Diagnosis present

## 2020-09-02 DIAGNOSIS — F32A Depression, unspecified: Secondary | ICD-10-CM | POA: Diagnosis present

## 2020-09-02 DIAGNOSIS — I10 Essential (primary) hypertension: Secondary | ICD-10-CM | POA: Diagnosis present

## 2020-09-02 DIAGNOSIS — R600 Localized edema: Secondary | ICD-10-CM | POA: Diagnosis not present

## 2020-09-02 DIAGNOSIS — Z823 Family history of stroke: Secondary | ICD-10-CM

## 2020-09-02 DIAGNOSIS — Z880 Allergy status to penicillin: Secondary | ICD-10-CM

## 2020-09-02 HISTORY — DX: Cerebral infarction, unspecified: I63.9

## 2020-09-02 HISTORY — DX: Atherosclerotic heart disease of native coronary artery without angina pectoris: I25.10

## 2020-09-02 HISTORY — DX: Gastro-esophageal reflux disease without esophagitis: K21.9

## 2020-09-02 HISTORY — DX: Heart failure, unspecified: I50.9

## 2020-09-02 LAB — CBC WITH DIFFERENTIAL/PLATELET
Abs Immature Granulocytes: 0.05 10*3/uL (ref 0.00–0.07)
Basophils Absolute: 0 10*3/uL (ref 0.0–0.1)
Basophils Relative: 0 %
Eosinophils Absolute: 0 10*3/uL (ref 0.0–0.5)
Eosinophils Relative: 0 %
HCT: 22.2 % — ABNORMAL LOW (ref 36.0–46.0)
Hemoglobin: 6.8 g/dL — CL (ref 12.0–15.0)
Immature Granulocytes: 1 %
Lymphocytes Relative: 6 %
Lymphs Abs: 0.7 10*3/uL (ref 0.7–4.0)
MCH: 28.3 pg (ref 26.0–34.0)
MCHC: 30.6 g/dL (ref 30.0–36.0)
MCV: 92.5 fL (ref 80.0–100.0)
Monocytes Absolute: 0.4 10*3/uL (ref 0.1–1.0)
Monocytes Relative: 4 %
Neutro Abs: 9.2 10*3/uL — ABNORMAL HIGH (ref 1.7–7.7)
Neutrophils Relative %: 89 %
Platelets: 571 10*3/uL — ABNORMAL HIGH (ref 150–400)
RBC: 2.4 MIL/uL — ABNORMAL LOW (ref 3.87–5.11)
RDW: 19.9 % — ABNORMAL HIGH (ref 11.5–15.5)
WBC: 10.4 10*3/uL (ref 4.0–10.5)
nRBC: 0 % (ref 0.0–0.2)

## 2020-09-02 LAB — COMPREHENSIVE METABOLIC PANEL
ALT: 24 U/L (ref 0–44)
AST: 21 U/L (ref 15–41)
Albumin: 2.7 g/dL — ABNORMAL LOW (ref 3.5–5.0)
Alkaline Phosphatase: 107 U/L (ref 38–126)
Anion gap: 7 (ref 5–15)
BUN: 60 mg/dL — ABNORMAL HIGH (ref 8–23)
CO2: 24 mmol/L (ref 22–32)
Calcium: 8 mg/dL — ABNORMAL LOW (ref 8.9–10.3)
Chloride: 106 mmol/L (ref 98–111)
Creatinine, Ser: 2 mg/dL — ABNORMAL HIGH (ref 0.44–1.00)
GFR, Estimated: 25 mL/min — ABNORMAL LOW (ref >60)
Glucose, Bld: 267 mg/dL — ABNORMAL HIGH (ref 70–99)
Potassium: 5.8 mmol/L — ABNORMAL HIGH (ref 3.5–5.1)
Sodium: 137 mmol/L (ref 135–145)
Total Bilirubin: 0.7 mg/dL (ref 0.3–1.2)
Total Protein: 7.2 g/dL (ref 6.5–8.1)

## 2020-09-02 LAB — RESP PANEL BY RT-PCR (FLU A&B, COVID) ARPGX2
Influenza A by PCR: NEGATIVE
Influenza B by PCR: NEGATIVE
SARS Coronavirus 2 by RT PCR: NEGATIVE

## 2020-09-02 LAB — TROPONIN I (HIGH SENSITIVITY)
Troponin I (High Sensitivity): 5 ng/L (ref <18)
Troponin I (High Sensitivity): 5 ng/L (ref <18)

## 2020-09-02 LAB — PROTIME-INR
INR: 1.5 — ABNORMAL HIGH (ref 0.8–1.2)
Prothrombin Time: 18.1 seconds — ABNORMAL HIGH (ref 11.4–15.2)

## 2020-09-02 LAB — CBG MONITORING, ED
Glucose-Capillary: 227 mg/dL — ABNORMAL HIGH (ref 70–99)
Glucose-Capillary: 201 mg/dL — ABNORMAL HIGH (ref 70–99)
Glucose-Capillary: 181 mg/dL — ABNORMAL HIGH (ref 70–99)

## 2020-09-02 LAB — BRAIN NATRIURETIC PEPTIDE
B Natriuretic Peptide: 1368 pg/mL — ABNORMAL HIGH (ref 0.0–100.0)
B Natriuretic Peptide: 1311 pg/mL — ABNORMAL HIGH (ref 0.0–100.0)

## 2020-09-02 LAB — PREPARE RBC (CROSSMATCH)

## 2020-09-02 LAB — GLUCOSE, CAPILLARY: Glucose-Capillary: 202 mg/dL — ABNORMAL HIGH (ref 70–99)

## 2020-09-02 MED ORDER — LEVALBUTEROL HCL 0.63 MG/3ML IN NEBU: 0.6300 mg | INHALATION_SOLUTION | Freq: Four times a day (QID) | RESPIRATORY_TRACT | Status: DC | PRN

## 2020-09-02 MED ORDER — SODIUM CHLORIDE 0.9% FLUSH
3.0000 mL | INTRAVENOUS | Status: DC | PRN
  Administered 2020-09-02 – 2020-09-04 (×2): 3 mL via INTRAVENOUS

## 2020-09-02 MED ORDER — FUROSEMIDE 10 MG/ML IJ SOLN: 20.0000 mg | Freq: Once | INTRAMUSCULAR | Status: DC

## 2020-09-02 MED ORDER — DICYCLOMINE HCL 10 MG PO CAPS: 10.0000 mg | ORAL_CAPSULE | Freq: Two times a day (BID) | ORAL | Status: DC | PRN

## 2020-09-02 MED ORDER — FUROSEMIDE 10 MG/ML IJ SOLN
80.0000 mg | Freq: Once | INTRAMUSCULAR | Status: AC
  Administered 2020-09-02: 80 mg via INTRAVENOUS
  Filled 2020-09-02: qty 8

## 2020-09-02 MED ORDER — SODIUM BICARBONATE 650 MG PO TABS
1300.0000 mg | ORAL_TABLET | Freq: Two times a day (BID) | ORAL | Status: DC
  Administered 2020-09-02 – 2020-09-05 (×7): 1300 mg via ORAL
  Filled 2020-09-02 (×7): qty 2

## 2020-09-02 MED ORDER — ONDANSETRON HCL 4 MG/2ML IJ SOLN
4.0000 mg | Freq: Four times a day (QID) | INTRAMUSCULAR | Status: DC | PRN
  Filled 2020-09-02: qty 2

## 2020-09-02 MED ORDER — LABETALOL HCL 200 MG PO TABS
200.0000 mg | ORAL_TABLET | Freq: Two times a day (BID) | ORAL | Status: DC
  Administered 2020-09-02 – 2020-09-05 (×7): 200 mg via ORAL
  Filled 2020-09-02 (×7): qty 1

## 2020-09-02 MED ORDER — ISOSORBIDE DINITRATE 20 MG PO TABS
30.0000 mg | ORAL_TABLET | Freq: Three times a day (TID) | ORAL | Status: DC
  Administered 2020-09-02 – 2020-09-05 (×10): 30 mg via ORAL
  Filled 2020-09-02: qty 1
  Filled 2020-09-02: qty 2
  Filled 2020-09-02 (×2): qty 1
  Filled 2020-09-02 (×3): qty 2
  Filled 2020-09-02: qty 1
  Filled 2020-09-02 (×4): qty 2

## 2020-09-02 MED ORDER — FERROUS SULFATE 325 (65 FE) MG PO TABS
325.0000 mg | ORAL_TABLET | Freq: Two times a day (BID) | ORAL | Status: DC
  Administered 2020-09-02 – 2020-09-05 (×7): 325 mg via ORAL
  Filled 2020-09-02 (×7): qty 1

## 2020-09-02 MED ORDER — ALBUTEROL SULFATE HFA 108 (90 BASE) MCG/ACT IN AERS: 2.0000 | INHALATION_SPRAY | RESPIRATORY_TRACT | Status: DC | PRN

## 2020-09-02 MED ORDER — ONDANSETRON HCL 4 MG PO TABS: 4.0000 mg | ORAL_TABLET | Freq: Four times a day (QID) | ORAL | Status: DC | PRN

## 2020-09-02 MED ORDER — GABAPENTIN 300 MG PO CAPS: 300.0000 mg | ORAL_CAPSULE | Freq: Every day | ORAL | Status: DC | PRN

## 2020-09-02 MED ORDER — OXYCODONE HCL 5 MG PO TABS
5.0000 mg | ORAL_TABLET | ORAL | Status: DC | PRN
  Administered 2020-09-03 (×2): 5 mg via ORAL
  Filled 2020-09-02 (×2): qty 1

## 2020-09-02 MED ORDER — LABETALOL HCL 5 MG/ML IV SOLN
5.0000 mg | Freq: Once | INTRAVENOUS | Status: AC
  Administered 2020-09-02: 5 mg via INTRAVENOUS
  Filled 2020-09-02: qty 4

## 2020-09-02 MED ORDER — AMLODIPINE BESYLATE 5 MG PO TABS
5.0000 mg | ORAL_TABLET | Freq: Every day | ORAL | Status: DC
  Administered 2020-09-02: 5 mg via ORAL
  Filled 2020-09-02: qty 1

## 2020-09-02 MED ORDER — SENNOSIDES-DOCUSATE SODIUM 8.6-50 MG PO TABS: 1.0000 | ORAL_TABLET | Freq: Every evening | ORAL | Status: DC | PRN

## 2020-09-02 MED ORDER — IPRATROPIUM BROMIDE 0.02 % IN SOLN
0.5000 mg | Freq: Four times a day (QID) | RESPIRATORY_TRACT | Status: DC | PRN
  Administered 2020-09-04: 0.5 mg via RESPIRATORY_TRACT
  Filled 2020-09-02: qty 2.5

## 2020-09-02 MED ORDER — SODIUM CHLORIDE 0.9% FLUSH
3.0000 mL | Freq: Two times a day (BID) | INTRAVENOUS | Status: DC
  Administered 2020-09-02 (×2): 3 mL via INTRAVENOUS

## 2020-09-02 MED ORDER — BUSPIRONE HCL 5 MG PO TABS
10.0000 mg | ORAL_TABLET | Freq: Two times a day (BID) | ORAL | Status: DC
  Administered 2020-09-02 – 2020-09-05 (×7): 10 mg via ORAL
  Filled 2020-09-02 (×7): qty 2

## 2020-09-02 MED ORDER — PANTOPRAZOLE SODIUM 40 MG IV SOLR
40.0000 mg | Freq: Two times a day (BID) | INTRAVENOUS | Status: DC
  Administered 2020-09-02 – 2020-09-05 (×7): 40 mg via INTRAVENOUS
  Filled 2020-09-02 (×7): qty 40

## 2020-09-02 MED ORDER — SODIUM CHLORIDE 0.9 % IV SOLN
300.0000 mg | INTRAVENOUS | Status: DC
  Filled 2020-09-02 (×2): qty 15

## 2020-09-02 MED ORDER — FUROSEMIDE 40 MG PO TABS
40.0000 mg | ORAL_TABLET | Freq: Every day | ORAL | Status: DC
  Administered 2020-09-03 – 2020-09-05 (×3): 40 mg via ORAL
  Filled 2020-09-02 (×3): qty 1

## 2020-09-02 MED ORDER — INSULIN ASPART 100 UNIT/ML IJ SOLN
0.0000 [IU] | Freq: Three times a day (TID) | INTRAMUSCULAR | Status: DC
  Administered 2020-09-02 (×2): 2 [IU] via SUBCUTANEOUS
  Administered 2020-09-02 – 2020-09-04 (×4): 1 [IU] via SUBCUTANEOUS
  Filled 2020-09-02 (×2): qty 1

## 2020-09-02 MED ORDER — ACETAMINOPHEN 325 MG PO TABS: 650.0000 mg | ORAL_TABLET | Freq: Four times a day (QID) | ORAL | Status: DC | PRN

## 2020-09-02 MED ORDER — SODIUM CHLORIDE 0.9% IV SOLUTION: Freq: Once | INTRAVENOUS | Status: AC

## 2020-09-02 MED ORDER — ALBUTEROL SULFATE (2.5 MG/3ML) 0.083% IN NEBU
2.5000 mg | INHALATION_SOLUTION | RESPIRATORY_TRACT | Status: DC | PRN
  Administered 2020-09-04: 2.5 mg via RESPIRATORY_TRACT
  Filled 2020-09-02: qty 3

## 2020-09-02 MED ORDER — SODIUM CHLORIDE 0.9 % IV SOLN: 250.0000 mL | INTRAVENOUS | Status: DC | PRN

## 2020-09-02 MED ORDER — FUROSEMIDE 10 MG/ML IJ SOLN
20.0000 mg | Freq: Once | INTRAMUSCULAR | Status: AC
  Administered 2020-09-02: 20 mg via INTRAVENOUS
  Filled 2020-09-02: qty 2

## 2020-09-02 MED ORDER — ATORVASTATIN CALCIUM 40 MG PO TABS
80.0000 mg | ORAL_TABLET | Freq: Every evening | ORAL | Status: DC
  Administered 2020-09-02 – 2020-09-04 (×3): 80 mg via ORAL
  Filled 2020-09-02 (×3): qty 2

## 2020-09-02 MED ORDER — TRAZODONE HCL 50 MG PO TABS
25.0000 mg | ORAL_TABLET | Freq: Every evening | ORAL | Status: DC | PRN
  Filled 2020-09-02: qty 1

## 2020-09-02 MED ORDER — SODIUM CHLORIDE 0.9% FLUSH
3.0000 mL | Freq: Two times a day (BID) | INTRAVENOUS | Status: DC
  Administered 2020-09-02 – 2020-09-05 (×5): 3 mL via INTRAVENOUS

## 2020-09-02 MED ORDER — BISACODYL 5 MG PO TBEC: 5.0000 mg | DELAYED_RELEASE_TABLET | Freq: Every day | ORAL | Status: DC | PRN

## 2020-09-02 MED ORDER — LEVOTHYROXINE SODIUM 25 MCG PO TABS
125.0000 ug | ORAL_TABLET | Freq: Every day | ORAL | Status: DC
  Administered 2020-09-03 – 2020-09-05 (×3): 125 ug via ORAL
  Filled 2020-09-02 (×3): qty 1

## 2020-09-02 MED ORDER — HYDRALAZINE HCL 20 MG/ML IJ SOLN
10.0000 mg | INTRAMUSCULAR | Status: DC | PRN
  Administered 2020-09-02: 10 mg via INTRAVENOUS
  Filled 2020-09-02: qty 1

## 2020-09-02 MED ORDER — SODIUM CHLORIDE 0.9 % IV SOLN: INTRAVENOUS | Status: DC

## 2020-09-02 MED ORDER — HYDROMORPHONE HCL 1 MG/ML IJ SOLN: 0.5000 mg | INTRAMUSCULAR | Status: DC | PRN

## 2020-09-02 MED ORDER — SODIUM CHLORIDE 0.9 % IV SOLN
300.0000 mg | INTRAVENOUS | Status: AC
  Administered 2020-09-02 – 2020-09-04 (×3): 300 mg via INTRAVENOUS
  Filled 2020-09-02 (×3): qty 15

## 2020-09-02 MED ORDER — ESCITALOPRAM OXALATE 10 MG PO TABS
10.0000 mg | ORAL_TABLET | Freq: Every day | ORAL | Status: DC
  Administered 2020-09-02 – 2020-09-05 (×4): 10 mg via ORAL
  Filled 2020-09-02 (×4): qty 1

## 2020-09-02 MED ORDER — ACETAMINOPHEN 650 MG RE SUPP: 650.0000 mg | Freq: Four times a day (QID) | RECTAL | Status: DC | PRN

## 2020-09-02 NOTE — H&P (Signed)
History and Physical   Patient: Beth Padilla                            PCP: Kendrick Ranch, MD                    DOB: 22-Jun-1940            DOA: 09/02/2020 GBT:[id number]             DOS: 09/02/2020, 8:03 AM  Vasireddy, Lanetta Inch, MD  Patient coming from:   HOME  I have personally reviewed patient's medical records, in electronic medical records, including:  [facility], and care everywhere.    Chief Complaint:   Chief Complaint  Patient presents with   Shortness of Breath    History of present illness:    Beth Padilla is a 80 y.o. female with medical history significant of pA. 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  moderate aortic stenosis,  noted nodular liver/?? Cirrhosis on CT April 2021 Presenting with progressive shortness of breath, gaining 3-4 pounds in last few days.  Patient reports that her shortness of breath is persistently getting worse since she had COVID but has been getting worse in the past couple of days.  States she recently was at med and was told to stop her Lasix at that time.    Chronically anticoagulated on Eliquis due to her atrial fibrillation, reporting a dark stool again.  Patient had a GI work-up EGD July 2022 discovering gastric ulcers..  Supposed to follow-up with gastroenterology as an outpatient and was planned for repeat EGD with in 10 to 12 weeks.   Patient Denies having: Fever, Chills, Cough, SOB, Chest Pain, Abd pain, N/V/D, headache, dizziness, lightheadedness,  Dysuria, Joint pain, rash, open wounds  ED Course:   Vitals:   09/02/20 0630 09/02/20 0700  BP: (!) 173/96 (!) 185/67  Pulse: 77 64  Resp: (!) 25 (!) 23  Temp:    SpO2: 93% 95%   Abnormal labs; hemoglobin 6.8, hematocrit 22.5, platelets 571, potassium 5.8, BUN 60, creatinine 2.0  calcium 8.0  Review of Systems: As per HPI, otherwise 10 point review of systems were negative.   ----------------------------------------------------------------------------------------------------------------------  Allergies  Allergen Reactions   Ampicillin Rash    Home MEDs:  Prior to Admission medications   Medication Sig Start Date End Date Taking? Authorizing Provider  acetaminophen (TYLENOL) 650 MG CR tablet Take 650 mg by mouth every 8 (eight) hours as needed for pain.    [provider]  amLODipine (NORVASC) 5 MG tablet Take 5 mg by mouth daily. 08/02/20   [provider]  apixaban (ELIQUIS) 2.5 MG TABS tablet Take 2.5 mg by mouth 2 (two) times daily.    [provider]  atorvastatin (LIPITOR) 80 MG tablet Take 1 tablet (80 mg total) by mouth every evening. 07/22/14   [name], MD  busPIRone (BUSPAR) 10 MG tablet Take 10 mg by mouth 2 (two) times daily. 07/14/20   [provider]  [facility] DICLOFENAC SODIUM 1 % GEL Apply 2 g topically 4 (four) times daily as needed (pain). 07/08/20   [provider]  dicyclomine (BENTYL) 10 MG capsule Take 1 capsule (10 mg total) by mouth every 12 (twelve) hours as needed (abdominal pain). 08/18/20  Montez Morita, Quillian Quince, MD  escitalopram (LEXAPRO) 10 MG tablet Take 10 mg by mouth daily.    [provider]  ferrous sulfate 325 (65 FE) MG tablet Take 1 tablet by mouth 2 (two) times daily. 11/09/17   [provider]  furosemide (LASIX) 20 MG tablet Take 2 tablets (40 mg total) by mouth daily. 07/26/20   Barton Dubois, MD  gabapentin (NEURONTIN) 300 MG capsule Take 1 capsule by mouth daily as needed (nerve pain). 01/03/18   [provider]  isosorbide dinitrate (ISORDIL) 30 MG tablet Take 30 mg by mouth 3 (three) times daily.  09/18/15   [provider]  levothyroxine (SYNTHROID, LEVOTHROID) 100 MCG tablet Take 125 mcg by mouth daily before breakfast.    [provider]  pantoprazole (PROTONIX) 40 MG tablet Take 1 tablet (40 mg total) by mouth 2 (two) times daily. 08/18/20 09/17/20  [name], MD  sodium bicarbonate 650 MG tablet Take 1,300 mg by mouth 2 (two) times daily.    [provider]  Vitamins A & D (VITAMIN A & D) ointment Apply 1 application topically as needed for dry skin. 11/09/17   [provider]    PRN MEDs: sodium chloride, acetaminophen **OR** acetaminophen, albuterol, bisacodyl, hydrALAZINE, HYDROmorphone (DILAUDID) injection, ipratropium, levalbuterol, ondansetron **OR** ondansetron (ZOFRAN) IV, oxyCODONE, senna-docusate, sodium chloride flush, traZODone  Past Medical History:  Diagnosis Date   Anemia    Atrial fibrillation ([place])    Depression    Diabetes mellitus    Hyperlipidemia 12/15/2010   Hypertension    Hypothyroidism 12/15/2010   Pneumonia    Renal disorder     Past Surgical History:  Procedure Laterality Date   ABDOMINAL HYSTERECTOMY     BIOPSY  08/05/2020   Procedure: BIOPSY;  Surgeon: [name], DO;  Location: [facility];  Service: Endoscopy;;   EP IMPLANTABLE DEVICE N/A 07/21/2014   Procedure: Loop Recorder Insertion;  Surgeon: [name], MD;  Location: [facility];  Service: Cardiovascular;  Laterality: N/A;   ESOPHAGOGASTRODUODENOSCOPY (EGD) WITH PROPOFOL N/A 08/05/2020   Procedure: ESOPHAGOGASTRODUODENOSCOPY (EGD) WITH PROPOFOL;  Surgeon: [name], DO;  Location: [facility];  Service: Endoscopy;  Laterality: N/A;   implantable loop recorder removal  10/14/2018   MDT Reveal LINQ removed in office by Dr [name]   TEE WITHOUT CARDIOVERSION N/A 07/21/2014   Procedure: TRANSESOPHAGEAL ECHOCARDIOGRAM (TEE);  Surgeon: [name], MD;  Location: [facility];  Service: Cardiovascular;  Laterality: N/A;     reports that she has never smoked. She has never used smokeless tobacco. She reports that she does not drink alcohol and does not use  drugs.   Family History  Problem Relation Age of Onset   Stroke Mother    Cancer Sister     Physical Exam:   Vitals:   09/02/20 0530 09/02/20 0602 09/02/20 0630 09/02/20 0700  BP: (!) 169/89 (!) 172/92 (!) 173/96 (!) 185/67  Pulse: (!) 46 64 77 64  Resp: (!) 23 (!) 26 (!) 25 (!) 23  Temp:      SpO2: 96% 94% 93% 95%  Weight:      Height:       Constitutional: NAD, calm, comfortable Eyes: PERRL, lids and conjunctivae normal ENMT: Mucous membranes are moist. Posterior pharynx clear of any exudate or lesions.Normal dentition.  Neck: normal, supple, no masses, no thyromegaly Respiratory: clear to auscultation bilaterally, no wheezing, no crackles. Normal respiratory effort. No accessory muscle use.  Cardiovascular:  Regular rate and rhythm, no murmurs / rubs / gallops. No extremity edema. 2+ pedal pulses. No carotid bruits.  Abdomen: no tenderness, no masses palpated. No hepatosplenomegaly. Bowel sounds positive.  Musculoskeletal: no clubbing / cyanosis. No joint deformity upper and lower extremities. Good ROM, no contractures. Normal muscle tone.  Neurologic: CN II-XII grossly intact. Sensation intact, DTR normal. Strength 5/5 in all 4.  Psychiatric: Normal judgment and insight. Alert and oriented x 3. Normal mood.  Skin: no rashes, lesions, ulcers. No induration Decubitus/ulcers:  Wounds: per nursing documentation         Labs on admission:    I have personally reviewed following labs and imaging studies  CBC: Recent Labs  Lab 09/02/20 0433  WBC 10.4  NEUTROABS 9.2*  HGB 6.8*  HCT 22.2*  MCV 92.5  PLT 505*   Basic Metabolic Panel: Recent Labs  Lab 09/02/20 0433  NA 137  K 5.8*  CL 106  CO2 24  GLUCOSE 267*  BUN 60*  CREATININE 2.00*  CALCIUM 8.0*   GFR: Estimated Creatinine Clearance: 23.6 mL/min (A) (by C-G formula based on SCr of 2 mg/dL (H)). Liver Function Tests: Recent Labs  Lab 09/02/20 0433  AST 21  ALT 24  ALKPHOS 107  BILITOT 0.7   PROT 7.2  ALBUMIN 2.7*   No results for input(s): LIPASE, AMYLASE in the last 168 hours. No results for input(s): AMMONIA in the last 168 hours. Coagulation Profile: Recent Labs  Lab 09/02/20 0636  INR 1.5*   Urine analysis:    Component Value Date/Time   COLORURINE STRAW (A) 02/13/2018 [place] 02/13/2018 1705   LABSPEC 1.006 02/13/2018 1705   PHURINE 7.0 02/13/2018 [place] 02/13/2018 1705   HGBUR NEGATIVE 02/13/2018 [place] 02/13/2018 [place] 02/13/2018 1705   PROTEINUR 30 (A) 02/13/2018 1705   UROBILINOGEN 0.2 07/18/2014 2120   NITRITE NEGATIVE 02/13/2018 1705   LEUKOCYTESUR SMALL (A) 02/13/2018 1705     Radiologic Exams on Admission:   DG Chest 2 View  Result Date: 09/02/2020 CLINICAL DATA:  Shortness of breath. EXAM: CHEST - 2 VIEW COMPARISON:  Chest radiograph dated 07/24/2020. FINDINGS: Bilateral pulmonary opacities predominantly involving the mid to lower lobes most concerning for multilobar pneumonia. Overall there has been interval progression of the opacities compared to prior radiograph. Edema is not excluded. There is no pleural effusion pneumothorax. Able cardiac silhouette. Atherosclerotic calcification of the aorta. No acute osseous pathology. Degenerative changes of the shoulders and spine. IMPRESSION: Interval progression of bilateral pulmonary opacities most concerning for multilobar pneumonia. Electronically Signed   By: Anner Crete M.D.   On: 09/02/2020 03:04    EKG:   Independently reviewed.  Orders placed or performed during the hospital encounter of 09/02/20   ED EKG   ED EKG   EKG 12-Lead   ---------------------------------------------------------------------------------------------------------------------------------------    Assessment / Plan:   Principal Problem:   Symptomatic anemia Active Problems:   Acute respiratory failure with hypoxia (HCC)   Severe  anemia   Type 2 diabetes with nephropathy (HCC)   HTN (hypertension)   Hypothyroidism   Hyperlipidemia   Acute renal failure superimposed on stage 4 chronic kidney disease (HCC)   Stroke (HCC)   Chronic diastolic HF (heart failure) (HCC)   Depression   H/O: CVA (cerebrovascular accident)-- Has Afib      Acute on chronic anemia most likely due to ABLA--dark stools + heme positive/ h/o gastric ulcers -  H&H remained stable -Hemoglobin 6.8 >>> transfusing 2U PRBC -Pros and cons of blood transfusion discussed with patient detail -Holding Eliquis  -On her previous admission and July 2022... She received total of 4 units of PRBC was discharged with a hemoglobin of 9.1.  -Continue IV Protonix  -Consulting GI for further evaluation--get input   -Nonbleeding gastric ulcer and duodenitis ulcer-EGD 08/05/2020: Gastric biopsy. -Continue IV Protonix, appreciate GI input  (On last admission patient was treated for possible liver cirrhosis, likely treated for SBP   -CT on this admission revealed nodular liver possible liver cirrhosis, LFTs within normal limits,, no signs of portal hypertension, -Ultrasound found no nodular hepatic -GI considering pancreatic CT with contrast in the future versus EUS (contrast not indicated due to CKD stage IV)      Dyspnea  -Likely CHF exacerbation, profound anemia -Continue supplemental oxygen -as needed -Currently maintaining O2 sat 97% on room air -We will monitor closely -We will continue to treat underlying causes of anemia and CHF   PAFIb--history of prior stroke in 2016- -Rate well controlled -On Eliquis, with holding due to profound anemia, possible GI bleed -Very high risk for another stroke, stable - 1.2 % annual risk of stroke    CKD stage - IV --Creatinine is 2.0 which is close to patient's baseline BUN is 72, GFR is 25 -Creatinine 2.0, 1 -Renal function appears to be close to her baseline,   -We will avoid nephrotoxins,  DM  II -last A1c 6.8 reflecting good diabetic control PTA -Continue strict diabetic diet, currently no medication unless   HFpEF--history of diastolic CHF -Elevated BNP 1368 -Monitoring I's and O's, daily weight -BNP 1368, -Apparently patient's Lasix was held, she was supposed to be on Diovan and acetazolamide It appears that she has not been taking them EF on echo from 07/22/2020 was 55 to 60% Recommending close follow-up with cardiologist regarding restarting above medications -Patient has received 80 mg of Lasix, with transfusion we will continue 20 mg of Lasix before in between after PRBC transfusions total of 60 mg  more, and 40 mg  IV daily     Anxiety and depression--- comfortable, stable we will, continue Lexapro and buspirone   Hypothyroidism--- stable continue levothyroxine 100 mcg daily     HTN -Elevated BP, restarting home medication -Resuming home medication of  amlodipine 5 mg daily, hydralazine 50 mg 3 times daily, isosorbide 30 mg daily   History of prior stroke--- in the setting of A. fib, please see #2 above -Continue Lipitor,  -Eliquis on hold as above         Cultures:  -none  Antimicrobial: -none   Consults called:  GI -------------------------------------------------------------------------------------------------------------------------------------------- DVT prophylaxis: SCD/Compression stockings Code Status:   Code Status: Full Code   Admission status: Patient will be admitted as Inpatient, with a greater than 2 midnight length of stay. Level of care: Telemetry   Family Communication:  none at bedside  (The above findings and plan of care has been discussed with patient in detail, the patient expressed understanding and agreement of above plan)  --------------------------------------------------------------------------------------------------------------------------------------------------  Disposition Plan: >3 days Status is:  Inpatient  Remains inpatient appropriate because:Hemodynamically unstable, IV treatments appropriate due to intensity of illness or inability to take PO, and Inpatient level of care appropriate due to severity of illness  Dispo: The patient is from: Home              Anticipated d/c is to: Home  Patient currently is not medically stable to d/c.   Difficult to place patient No     ----------------------------------------------------------------------------------------------------------------------------------------------------  Time spent: > than  55  Min.   SIGNED: Deatra James, MD, FHM. [facility],  Pager (Please use [email or web address] to page to text)  If 7PM-7AM, please contact night-coverage [email or web address],  09/02/2020, 8:03 AM

## 2020-09-02 NOTE — Consult Note (Signed)
Palliative Care Consult Note                                  Date: 09/02/2020   Patient Name: Beth Padilla  DOB: 1940-04-29  MRN: [id number]  Age / Sex: 80 y.o., female  PCP: Vasireddy, Lanetta Inch, MD Referring Physician: Deatra James, MD  Reason for Consultation: Establishing goals of care  HPI/Patient Profile: 80 y.o. female  with past medical history of pA. Fib on Eliquis, chronic anemia, depression, DM II, HTN, HLD, hyperlipidemia, hypertension, hypothyroidism, history of CVA-2016, CKD stage IV, diastolic CHF, GI bleed with gastric ulcers in July 2022, moderate aortic stenosis, noted nodular liver/possible Cirrhosis on CT April 2021 admitted on 09/02/2020 with dyspnea and 3-4 lb weight gain in last few days. She noted dyspnea with progressive worsening since COVID diagnosis but significantly worse last few days.  Recently at another facility (?[facility]?) where she was told to stop her Lasix. ER work-up found significant anemia (hgb 6.8, elevated BUN) likely known gastric ulcers, cHF exacerbation with BNP 1368, chronic CKD IV (at baseline Cr 2.0).  Palliative Medicine was consulted for [place] discussion and CODE STATUS.  Past Medical History:  Diagnosis Date  . Anemia   . Atrial fibrillation ([place])   . Depression   . Diabetes mellitus   . Hyperlipidemia 12/15/2010  . Hypertension   . Hypothyroidism 12/15/2010  . Pneumonia   . Renal disorder     Social History   Socioeconomic History  . Marital status: Widowed    Spouse name: Not on file  . Number of children: 5  . Years of education: 70  . Highest education level: Not on file  Occupational History  . Occupation: retired  Tobacco Use  . Smoking status: Never  . Smokeless tobacco: Never  Substance and Sexual Activity  . Alcohol use: No  . Drug use: No  . Sexual activity: Yes    Birth control/protection: Surgical  Other Topics Concern  . Not on file  Social  History Narrative   Patient drinks about 2-3 cups of caffeine daily.   Patient is ambidextrous.   Social Determinants of Health   Financial Resource Strain: Not on file  Food Insecurity: Not on file  Transportation Needs: Not on file  Physical Activity: Not on file  Stress: Not on file  Social Connections: Not on file    Family History  Problem Relation Age of Onset  . Stroke Mother   . Cancer Sister     Subjective:   This NP Walden Field reviewed medical records, received report from team, assessed the patient and then meet at the patient's bedside  to discuss diagnosis, prognosis, GOC, EOL wishes disposition and options.   Concept of Palliative Care was introduced as specialized medical care for people and their families living with serious illness.  If focuses on providing relief from the symptoms and stress of a serious illness.  The goal is to improve quality of life for both the patient and the family. Values and goals of care important to patient and family were attempted to be elicited.  Created space and opportunity for patient  and family to explore thoughts and feelings regarding current medical situation   Natural trajectory and expectations at EOL were discussed. Questions and concerns addressed. Patient  encouraged to call with questions or concerns.    Patient/Family Understanding of Illness: The patient notes she has chronic  illnesses.  She states her heart issue is "murmurs".  She states she has not been doing well since she had COVID she was diagnosed they in the emergency department and had to go back weeks later for pneumonia but then developed swelling which was felt to be an autoimmune reaction to.  She has been back and forth to the ER and hospitalizations for swelling/edema, dyspnea.  At some point she was told to stop her fluid pill, unsure why.  She knows she has lost some blood and they are giving her blood.  We had a discussion on multiple chronic illnesses  acute on chronic stage IV kidney disease, CHF (mostly diastolic, preserved EF), history of CVA, A. fib on anticoagulation which likely resulted in her previous CVA but not able to be on anticoagulation at this time due to GI bleed.  Life Review: The patient worked at [employer] for about 25 years.  She has a total of 5 children, 2 of which are toxic.  She also has 3 additional periods when in [place], chronic prediabetes, [place].  She has multiple grandkids.  Her hobbies include "schooling her grandkids", growing flowers, and her faith.  She is anxious denomination and enjoys gospel music.  Patient Values: Things that are important to the patient with family, faith, and independence  Today's Discussion: We discussed aggressive care versus more for comfort focused.  We discussed that in the light of her multiple chronic illnesses the likelihood that CPR/full code would be more traumatic than helpful.  We discussed that they are doing her best to manage her fluid overload and help her breathing.  Indicated she would likely feel better when her blood transfusion was given.  After this discussion on goals, different pathways of care,, this was required for her she made some initial decisions.  We discussed the importance of ongoing discussions about these entities to keep her goals in mind as she progresses.  If she should unfortunately have some clinical decline we can further discuss other goals.  She verbalized understanding and agreement.  Review of Systems  Constitutional:  Positive for activity change and fatigue.  Respiratory:  Positive for cough and shortness of breath.   Cardiovascular:  Positive for leg swelling.  Gastrointestinal:  Positive for blood in stool (dark stools). Negative for abdominal pain.   Objective:   Primary Diagnoses: Present on Admission: . Symptomatic anemia . Severe anemia . Hyperlipidemia . Depression . Type 2 diabetes with nephropathy ([place]) .  HTN (hypertension) . Hypothyroidism . Acute renal failure superimposed on stage 4 chronic kidney disease ([place]) . Stroke ([place]) . Chronic diastolic HF (heart failure) ([place]) . Acute respiratory failure with hypoxia (H[facility])   Scheduled Meds: . sodium chloride   Intravenous Once  . amLODipine  5 mg Oral Daily  . atorvastatin  80 mg Oral QPM  . busPIRone  10 mg Oral BID  . escitalopram  10 mg Oral Daily  . ferrous sulfate  325 mg Oral BID  . furosemide  20 mg Intravenous Once  . [START ON 09/03/2020] furosemide  40 mg Oral Daily  . insulin aspart  0-6 Units Subcutaneous TID WC  . isosorbide dinitrate  30 mg Oral TID  . [START ON 09/03/2020] levothyroxine  125 mcg Oral QAC breakfast  . pantoprazole (PROTONIX) IV  40 mg Intravenous Q12H  . sodium bicarbonate  1,300 mg Oral BID  . sodium chloride flush  3 mL Intravenous Q12H  . sodium chloride flush  3 mL Intravenous  Q12H    Continuous Infusions: . sodium chloride    . sodium chloride    . iron sucrose      PRN Meds: sodium chloride, acetaminophen **OR** acetaminophen, albuterol, bisacodyl, dicyclomine, gabapentin, hydrALAZINE, HYDROmorphone (DILAUDID) injection, ipratropium, levalbuterol, ondansetron **OR** ondansetron (ZOFRAN) IV, oxyCODONE, senna-docusate, sodium chloride flush, traZODone  Allergies  Allergen Reactions  . Ampicillin Rash    Physical Exam Vitals and nursing note reviewed.  Constitutional:      General: She is not in acute distress.    Appearance: She is ill-appearing.  HENT:     Head: Normocephalic and atraumatic.  Cardiovascular:     Rate and Rhythm: Normal rate and regular rhythm.  Pulmonary:     Effort: Respiratory distress (milk, unable to speak in full sentences) present.     Breath sounds: Rales present.  Abdominal:     General: Abdomen is flat.     Palpations: Abdomen is soft.     Tenderness: There is no abdominal tenderness.  Musculoskeletal:     Right lower leg: 3+ Pitting Edema present.      Left lower leg: 3+ Pitting Edema present.  Skin:    General: Skin is warm and dry.  Neurological:     Mental Status: She is alert.    Vital Signs:  BP (!) 185/67   Pulse 64   Temp 98.1 F (36.7 C)   Resp (!) 23   Ht 5\' 7"  (1.702 m)   Wt 77.1 kg   SpO2 95%   BMI 26.63 kg/m  Pain Scale: 0-10   Pain Score: 0-No pain  SpO2: SpO2: 95 % O2 Device:SpO2: 95 % O2 Flow Rate: .   IO: Intake/output summary: No intake or output data in the 24 hours ending 09/02/20 0838  LBM:   Baseline Weight: Weight: 77.1 kg Most recent weight: Weight: 77.1 kg      Palliative Assessment/Data: 50%    Advanced Care Planning:   Primary Decision Maker: PATIENT  Code Status/Advance Care Planning: Full code  A discussion was had today regarding advanced directives. Concepts specific to code status, artifical feeding and hydration, continued IV antibiotics and rehospitalization was had.  The difference between a aggressive medical intervention path and a palliative comfort care path for this patient at this time was had.   After explaining the risks versus benefits of full code, including the low likelyhood of survival to discharge given multiple chronic conditions and the trauma/discomfort often associated with CPR/intubation/defib, the patient states she wants to remain a full code stating "I'd like them to at least try." I reminded her of the need to keep communication and discussion open and ongoing based on how she does in the hospital. She verbalized understanding.   Decisions/Changes to ACP: Remain full code  Assessment & Plan:   I have reviewed the medical record, interviewed the patient and family, and examined the patient. The following aspects are pertinent.  Impression: Symptomatic anemia  Acute respiratory failure with hypoxia (HCC)  Severe anemia  Type 2 diabetes with nephropathy (HCC)  HTN (hypertension)  Hypothyroidism  Hyperlipidemia  Acute renal failure superimposed on  stage 4 chronic kidney disease (HCC)  Stroke (HCC)  Chronic diastolic HF (heart failure) (HCC)  Depression  H/O: CVA (cerebrovascular accident)-- Has Afib  Very pleasant 80 year old female with multiple chronic illnesses and significant recurrent admissions to the ER and to the hospital with dyspnea, heart failure especially since diagnosis with COVID around [facility].  She seems to be struggling with  edema, CHF exacerbations, acute blood loss anemia due to GI bleed, also complicated by her chronic kidney disease history of CVA, diabetes.  Overall, her long-term prognosis seems to be poor.  However, she was like continued aggressive care to try to get home, and feel better.  SUMMARY OF RECOMMENDATIONS   Continue full code Treat the treatable Full scope of care Continued discussions as she clinically progresses Palliative medicine will continue to support holistically  Symptom Management:  Per primary team Palliative medicine is available to assis as needed with symptom management  Palliative Prophylaxis:  Bowel Regimen, Frequent Pain Assessment, and frequent symptoms assessment (resp)  Additional Recommendations (Limitations, Scope, Preferences): Full Scope Treatment  Psycho-social/Spiritual:  Desire for further Chaplaincy support: yes Additional Recommendations: Caregiving  Support/Resources  Prognosis:  Unable to determine  Discharge Planning:  To Be Determined   Discussed with: Patient, nursing staff, medical team.  Thank you for allowing [place] to participate in the care of [place] PM[facility] will continue to support holistically.  Time In: 10:00 Time Out: 11:15 Time Total: 75 min  Greater than 50%  of this time was spent counseling and coordinating care related to the above assessment and plan.  Signed by: Walden Field, NP Palliative Medicine Team  Team Phone # [phone] (Nights/Weekends)  09/02/2020, 8:38 AM

## 2020-09-02 NOTE — ED Triage Notes (Signed)
SOB since tonight. 94% on room air in triage. Says this happens from time to time.

## 2020-09-02 NOTE — Consult Note (Signed)
Referring Provider: Deatra James, MD Primary Care Physician:  Kendrick Ranch, MD Primary Gastroenterologist:  Maylon Peppers MD  Reason for Consultation:  anemia  HPI: Beth Padilla is a 80 y.o. female with past medical history CKD, A. fib on Eliquis, moderate aortic stenosis, stroke, diabetes, hypertension, hyperlipidemia, hypothyroidism, heart failure, history of gastric ulcer presenting to the emergency department with complaints of worsening dyspnea, cough.  Patient presented to the ED earlier today with hemoglobin of 6.8, hematocrit 22.2, platelets 571,000, white blood cell count 10,400.  Potassium 5.8, BUN 60, creatinine 2.0, LFTs normal except albumin of 2.7.  BNP 1368.  SARS coronavirus 2 negative.  She had COVID 2 months prior.  INR 1.5.  Chest x-ray with interval progression of bilateral pulmonary opacities most concerning for multilobular pneumonia.  Patient recently seen by Dr. Jenetta Downer for hospital follow-up when she presented with upper GI bleed due to gastric ulcer on July 19.  Found to have a hemoglobin of 6.1 at that time.  She was heme positive.  Required 4 units of packed red blood cells with hemoglobin of 9.1 at time of discharge.  CT of the abdomen in 2021 showed liver nodularity.  EGD showed normal esophagus, diffuse erythema in the stomach with presence of nonbleeding cratered ulcer in the stomach with clean ulcer base in the antrum.  Biopsies negative for H. pylori or dysplasia.  She also had duodenitis.  Liver elastography was inconsistent with advanced liver disease but patient noted to have nodularity on liver imaging 2021.  Also noted to have heterogeneity in the pancreatic head and uncinate process with recommendations for repeat imaging of the pancreatic head at a later date.  She is currently scheduled for follow-up EGD in October to verify ulcer healing.  MRCP planned for further liver/pancreatic imaging. Plans for checking H.pylori serologies due  to unclear reason for PUD.  Labs from August 18, 2020 with hemoglobin of 8.7, hematocrit 27.9 white blood cell count 12,300, iron less than 10, TIBC 227, iron saturation is 2%, ferritin 156 (had been 12 one month prior).   Prior to Admission medications   Medication Sig Start Date End Date Taking? Authorizing Provider  acetaminophen (TYLENOL) 650 MG CR tablet Take 650 mg by mouth every 8 (eight) hours as needed for pain.   Yes [provider]  amLODipine (NORVASC) 5 MG tablet Take 5 mg by mouth daily. 08/02/20  Yes [provider]  apixaban (ELIQUIS) 2.5 MG TABS tablet Take 2.5 mg by mouth 2 (two) times daily.   Yes [provider]  atorvastatin (LIPITOR) 80 MG tablet Take 1 tablet (80 mg total) by mouth every evening. 07/22/14  Yes [name], MD  busPIRone (BUSPAR) 10 MG tablet Take 10 mg by mouth 2 (two) times daily. 07/14/20  Yes [provider]  [employer] DICLOFENAC SODIUM 1 % GEL Apply 2 g topically 4 (four) times daily as needed (pain). 07/08/20  Yes [provider]  dicyclomine (BENTYL) 10 MG capsule Take 1 capsule (10 mg total) by mouth every 12 (twelve) hours as needed (abdominal pain). 08/18/20  Yes [name], MD  escitalopram (LEXAPRO) 10 MG tablet Take 10 mg by mouth daily.   Yes [provider]  ferrous sulfate 325 (65 FE) MG tablet Take 1 tablet by mouth 2 (two) times daily. 11/09/17  Yes [provider]  furosemide (LASIX) 20 MG tablet Take 2 tablets (40 mg total) by mouth daily. 07/26/20  Yes Barton Dubois, MD  gabapentin (NEURONTIN)  300 MG capsule Take 1 capsule by mouth daily as needed (nerve pain). 01/03/18  Yes [provider]  isosorbide dinitrate (ISORDIL) 30 MG tablet Take 30 mg by mouth 3 (three) times daily.  09/18/15  Yes [provider]  levothyroxine (SYNTHROID, LEVOTHROID) 100 MCG tablet Take 125 mcg by mouth daily before breakfast.   Yes [provider]  pantoprazole  (PROTONIX) 40 MG tablet Take 1 tablet (40 mg total) by mouth 2 (two) times daily. 08/18/20 09/17/20 Yes [name], MD  sodium bicarbonate 650 MG tablet Take 1,300 mg by mouth 2 (two) times daily.   Yes [provider]  Vitamins A & D (VITAMIN A & D) ointment Apply 1 application topically as needed for dry skin. 11/09/17  Yes [provider]    Current Facility-Administered Medications  Medication Dose Route Frequency Provider Last Rate Last Admin   0.9 %  sodium chloride infusion   Intravenous Continuous Shahmehdi, Seyed A, MD       0.9 %  sodium chloride infusion  250 mL Intravenous PRN Shahmehdi, Seyed A, MD       acetaminophen (TYLENOL) tablet 650 mg  650 mg Oral Q6H PRN Shahmehdi, Seyed A, MD       Or   acetaminophen (TYLENOL) suppository 650 mg  650 mg Rectal Q6H PRN Shahmehdi, Seyed A, MD       albuterol (VENTOLIN HFA) 108 (90 Base) MCG/ACT inhaler 2 puff  2 puff Inhalation Q2H PRN Mesner, Corene Cornea, MD       amLODipine (NORVASC) tablet 5 mg  5 mg Oral Daily Shahmehdi, Seyed A, MD   5 mg at 09/02/20 0953   atorvastatin (LIPITOR) tablet 80 mg  80 mg Oral QPM Shahmehdi, Seyed A, MD       bisacodyl (DULCOLAX) EC tablet 5 mg  5 mg Oral Daily PRN Shahmehdi, Seyed A, MD       busPIRone (BUSPAR) tablet 10 mg  10 mg Oral BID Skipper Cliche A, MD   10 mg at 09/02/20 7829   dicyclomine (BENTYL) capsule 10 mg  10 mg Oral Q12H PRN Shahmehdi, Erling Conte A, MD       escitalopram (LEXAPRO) tablet 10 mg  10 mg Oral Daily Shahmehdi, Seyed A, MD   10 mg at 09/02/20 5621   ferrous sulfate tablet 325 mg  325 mg Oral BID Skipper Cliche A, MD   325 mg at 09/02/20 0953   [START ON 09/03/2020] furosemide (LASIX) tablet 40 mg  40 mg Oral Daily Shahmehdi, Seyed A, MD       gabapentin (NEURONTIN) capsule 300 mg  300 mg Oral Daily PRN Shahmehdi, Seyed A, MD       hydrALAZINE (APRESOLINE) injection 10 mg  10 mg Intravenous Q4H PRN Shahmehdi, Seyed A, MD       HYDROmorphone (DILAUDID)  injection 0.5-1 mg  0.5-1 mg Intravenous Q2H PRN Shahmehdi, Seyed A, MD       insulin aspart (novoLOG) injection 0-6 Units  0-6 Units Subcutaneous TID WC Shahmehdi, Seyed A, MD   2 Units at 09/02/20 0954   ipratropium (ATROVENT) nebulizer solution 0.5 mg  0.5 mg Nebulization Q6H PRN Shahmehdi, Seyed A, MD       iron sucrose (VENOFER) 300 mg in sodium chloride 0.9 % 250 mL IVPB  300 mg Intravenous Q24H Shahmehdi, Seyed A, MD       isosorbide dinitrate (ISORDIL) tablet 30 mg  30 mg Oral TID Deatra James, MD   30 mg  at 09/02/20 1004   levalbuterol (XOPENEX) nebulizer solution 0.63 mg  0.63 mg Nebulization Q6H PRN Deatra James, MD       [START ON 09/03/2020] levothyroxine (SYNTHROID) tablet 125 mcg  125 mcg Oral QAC breakfast Shahmehdi, Seyed A, MD       ondansetron (ZOFRAN) tablet 4 mg  4 mg Oral Q6H PRN Shahmehdi, Seyed A, MD       Or   ondansetron (ZOFRAN) injection 4 mg  4 mg Intravenous Q6H PRN Shahmehdi, Seyed A, MD       oxyCODONE (Oxy IR/ROXICODONE) immediate release tablet 5 mg  5 mg Oral Q4H PRN Shahmehdi, Seyed A, MD       pantoprazole (PROTONIX) injection 40 mg  40 mg Intravenous Q12H Shahmehdi, Seyed A, MD   40 mg at 09/02/20 0954   senna-docusate (Senokot-S) tablet 1 tablet  1 tablet Oral QHS PRN Shahmehdi, Seyed A, MD       sodium bicarbonate tablet 1,300 mg  1,300 mg Oral BID Shahmehdi, Seyed A, MD   1,300 mg at 09/02/20 0959   sodium chloride flush (NS) 0.9 % injection 3 mL  3 mL Intravenous Q12H Shahmehdi, Seyed A, MD   3 mL at 09/02/20 0956   sodium chloride flush (NS) 0.9 % injection 3 mL  3 mL Intravenous Q12H Shahmehdi, Seyed A, MD   3 mL at 09/02/20 0956   sodium chloride flush (NS) 0.9 % injection 3 mL  3 mL Intravenous PRN Skipper Cliche A, MD   3 mL at 09/02/20 0956   traZODone (DESYREL) tablet 25 mg  25 mg Oral QHS PRN Deatra James, MD       Current Outpatient Medications  Medication Sig Dispense Refill   acetaminophen (TYLENOL) 650 MG CR tablet Take 650  mg by mouth every 8 (eight) hours as needed for pain.     amLODipine (NORVASC) 5 MG tablet Take 5 mg by mouth daily.     apixaban (ELIQUIS) 2.5 MG TABS tablet Take 2.5 mg by mouth 2 (two) times daily.     atorvastatin (LIPITOR) 80 MG tablet Take 1 tablet (80 mg total) by mouth every evening. 30 tablet 2   busPIRone (BUSPAR) 10 MG tablet Take 10 mg by mouth 2 (two) times daily.     [employer] DICLOFENAC SODIUM 1 % GEL Apply 2 g topically 4 (four) times daily as needed (pain).     dicyclomine (BENTYL) 10 MG capsule Take 1 capsule (10 mg total) by mouth every 12 (twelve) hours as needed (abdominal pain). 120 capsule 2   escitalopram (LEXAPRO) 10 MG tablet Take 10 mg by mouth daily.     ferrous sulfate 325 (65 FE) MG tablet Take 1 tablet by mouth 2 (two) times daily.     furosemide (LASIX) 20 MG tablet Take 2 tablets (40 mg total) by mouth daily.     gabapentin (NEURONTIN) 300 MG capsule Take 1 capsule by mouth daily as needed (nerve pain).     isosorbide dinitrate (ISORDIL) 30 MG tablet Take 30 mg by mouth 3 (three) times daily.      levothyroxine (SYNTHROID, LEVOTHROID) 100 MCG tablet Take 125 mcg by mouth daily before breakfast.     pantoprazole (PROTONIX) 40 MG tablet Take 1 tablet (40 mg total) by mouth 2 (two) times daily. 120 tablet 0   sodium bicarbonate 650 MG tablet Take 1,300 mg by mouth 2 (two) times daily.     Vitamins A & D (VITAMIN A & D)  ointment Apply 1 application topically as needed for dry skin.      Allergies as of 09/02/2020 - Review Complete 09/02/2020  Allergen Reaction Noted   Ampicillin Rash 09/20/2012    Past Medical History:  Diagnosis Date   Anemia    Atrial fibrillation ([place])    Depression    Diabetes mellitus    Hyperlipidemia 12/15/2010   Hypertension    Hypothyroidism 12/15/2010   Pneumonia    Renal disorder     Past Surgical History:  Procedure Laterality Date   ABDOMINAL HYSTERECTOMY     BIOPSY  08/05/2020   Procedure: BIOPSY;  Surgeon: [name], DO;  Location: [facility];  Service: Endoscopy;;   EP IMPLANTABLE DEVICE N/A 07/21/2014   Procedure: Loop Recorder Insertion;  Surgeon: [name], MD;  Location: [place] [facility];  Service: Cardiovascular;  Laterality: N/A;   ESOPHAGOGASTRODUODENOSCOPY (EGD) WITH PROPOFOL N/A 08/05/2020   Procedure: ESOPHAGOGASTRODUODENOSCOPY (EGD) WITH PROPOFOL;  Surgeon: [name], DO;  Location: [facility];  Service: Endoscopy;  Laterality: N/A;   implantable loop recorder removal  10/14/2018   MDT Reveal LINQ removed in office by Dr [name]   TEE WITHOUT CARDIOVERSION N/A 07/21/2014   Procedure: TRANSESOPHAGEAL ECHOCARDIOGRAM (TEE);  Surgeon: [name], MD;  Location: [facility];  Service: Cardiovascular;  Laterality: N/A;    Family History  Problem Relation Age of Onset   Stroke Mother    Cancer Sister     Social History   Socioeconomic History   Marital status: Widowed    Spouse name: Not on file   Number of children: 5   Years of education: 50   Highest education level: Not on file  Occupational History   Occupation: retired  Tobacco Use   Smoking status: Never   Smokeless tobacco: Never  Substance and Sexual Activity   Alcohol use: No   Drug use: No   Sexual activity: Yes    Birth control/protection: Surgical  Other Topics Concern   Not on file  Social History Narrative   Patient drinks about 2-3 cups of caffeine daily.   Patient is ambidextrous.   Social Determinants of Health   Financial Resource Strain: Not on file  Food Insecurity: Not on file  Transportation Needs: Not on file  Physical Activity: Not on file  Stress: Not on file  Social Connections: Not on file  Intimate Partner Violence: Not on file     ROS:  General: Negative for anorexia, weight loss, fever, chills, fatigue, +weakness. Eyes: Negative for vision changes.  ENT: Negative for hoarseness, difficulty swallowing , nasal congestion. CV: Negative for chest pain, angina,  palpitations, +dyspnea on exertion, +peripheral edema.  Respiratory: Negative for dyspnea at rest.  +dyspnea on exertion, cough, clear sputum.  GI: See history of present illness. GU:  Negative for dysuria, hematuria, urinary incontinence, urinary frequency, nocturnal urination.  MS: Negative for joint pain, low back pain.  Derm: Negative for rash or itching.  Neuro: Negative for weakness, abnormal sensation, seizure, frequent headaches, memory loss, confusion.  Psych: Negative for anxiety, depression, suicidal ideation, hallucinations.  Endo: Negative for unusual weight change.  Heme: Negative for bruising or bleeding. Allergy: Negative for rash or hives.       Physical Examination: Vital signs in last 24 hours: Temp:  [98 F (36.7 C)-98.1 F (36.7 C)] 98 F (36.7 C) (08/18 0944) Pulse Rate:  [46-77] 67 (08/18 0944) Resp:  [19-26] 21 (08/18 0944) BP: (169-199)/(62-96) 185/67 (08/18 0700)  SpO2:  [93 %-96 %] 94 % (08/18 0944) Weight:  [77.1 kg] 77.1 kg (08/18 0240)    General: Well-nourished, well-developed in no acute distress.  Head: Normocephalic, atraumatic.   Eyes: Conjunctiva pink, no icterus. Mouth: Oropharyngeal mucosa moist and pink , no lesions erythema or exudate. Neck: Supple without thyromegaly, masses, or lymphadenopathy.  Lungs: Clear to auscultation bilaterally.  Heart: Regular rate and rhythm, no murmurs rubs or gallops.  Abdomen: Bowel sounds are normal, nontender, nondistended, no hepatosplenomegaly or masses, no abdominal bruits or    hernia , no rebound or guarding.   Rectal: not performed Extremities: 2+ pitting edema to upper thighs bilaterally. No edema, clubbing, deformity.  Neuro: Alert and oriented x 4 , grossly normal neurologically.  Skin: Warm and dry, no rash or jaundice.   Psych: Alert and cooperative, normal mood and affect.        Intake/Output from previous day: No intake/output data recorded. Intake/Output this shift: Total I/O In: 9  [I.V.:9] Out: -   Lab Results: CBC Recent Labs    09/02/20 0433  WBC 10.4  HGB 6.8*  HCT 22.2*  MCV 92.5  PLT 571*   BMET Recent Labs    09/02/20 0433  NA 137  K 5.8*  CL 106  CO2 24  GLUCOSE 267*  BUN 60*  CREATININE 2.00*  CALCIUM 8.0*   LFT Recent Labs    09/02/20 0433  BILITOT 0.7  ALKPHOS 107  AST 21  ALT 24  PROT 7.2  ALBUMIN 2.7*    Lipase No results for input(s): LIPASE in the last 72 hours.  PT/INR Recent Labs    09/02/20 0636  LABPROT 18.1*  INR 1.5*     Lab Results  Component Value Date   IRON <10 (L) 08/18/2020   TIBC 227 (L) 08/18/2020   FERRITIN 156 08/18/2020   Lab Results  Component Value Date   FERRITIN 156 08/18/2020   Lab Results  Component Value Date   VITAMINB12 1,034 (H) 07/25/2020   Lab Results  Component Value Date   FOLATE 16.2 07/25/2020     Imaging Studies: DG Chest 2 View  Result Date: 09/02/2020 CLINICAL DATA:  Shortness of breath. EXAM: CHEST - 2 VIEW COMPARISON:  Chest radiograph dated 07/24/2020. FINDINGS: Bilateral pulmonary opacities predominantly involving the mid to lower lobes most concerning for multilobar pneumonia. Overall there has been interval progression of the opacities compared to prior radiograph. Edema is not excluded. There is no pleural effusion pneumothorax. Able cardiac silhouette. Atherosclerotic calcification of the aorta. No acute osseous pathology. Degenerative changes of the shoulders and spine. IMPRESSION: Interval progression of bilateral pulmonary opacities most concerning for multilobar pneumonia. Electronically Signed   By: Anner Crete M.D.   On: 09/02/2020 03:04   US ABDOMEN COMPLETE W/ELASTOGRAPHY  Result Date: 08/04/2020 CLINICAL DATA:  Abnormal liver function tests. EXAM: ULTRASOUND ABDOMEN ULTRASOUND HEPATIC ELASTOGRAPHY TECHNIQUE: Sonography of the upper abdomen was performed. In addition, ultrasound elastography evaluation of the liver was performed. A region of  interest was placed within the right lobe of the liver. Following application of a compressive sonographic pulse, tissue compressibility was assessed. Multiple assessments were performed at the selected site. Median tissue compressibility was determined. Previously, hepatic stiffness was assessed by shear wave velocity. Based on recently published Society of Radiologists in Ultrasound consensus article, reporting is now recommended to be performed in the SI units of pressure (kiloPascals) representing hepatic stiffness/elasticity. The obtained result is compared to the published reference standards. (cACLD =  compensated Advanced Chronic Liver Disease) COMPARISON:  CT abdomen 05/03/2019 FINDINGS: ULTRASOUND ABDOMEN Gallbladder: Gallstones measuring up to 1.2 cm in diameter. Sonographic Murphy's sign absent. No significant gallbladder wall thickening. Common bile duct: Diameter: 0.3 cm Liver: Faintly accentuated echogenicity in the liver without definite nodular contours. No focal hepatic lesion identified. Portal vein is patent on color Doppler imaging with normal direction of blood flow towards the liver. IVC: No abnormality visualized. Pancreas: Poor visualization of the pancreatic head and uncinate process, questionable heterogeneity in this region. Spleen: Size and appearance within normal limits. Right Kidney: Length: 9.8 cm. Echogenicity within normal limits. No solid mass or hydronephrosis visualized. Right renal cysts include a 2.0 by 1.8 by 2.0 cm cyst and a 2.0 by 1.7 by 1.9 cm lower pole cyst, both appear simple. Left Kidney: Length: 8.5 cm. Echogenicity within normal limits. No solid mass or hydronephrosis visualized. Left renal cysts include a 1.0 by 1.1 by 0.9 cm cyst and a separate 1.0 by 0.7 by 0.8 cm cyst, both with reasonably simple characteristics. Abdominal aorta: No aneurysm visualized. Other findings: None. ULTRASOUND HEPATIC ELASTOGRAPHY Device: Siemens Helix VTQ Patient position: Supine  Transducer 5C1 Number of measurements: 10 Hepatic segment:  8 Median kPa: 7.2 IQR: 0.8 IQR/Median kPa ratio: 0.1 Data quality:  Good Diagnostic category: < or = 9 kPa: in the absence of other known clinical signs, rules out cACLD The use of hepatic elastography is applicable to patients with viral hepatitis and non-alcoholic fatty liver disease. At this time, there is insufficient data for the referenced cut-off values and use in other causes of liver disease, including alcoholic liver disease. Patients, however, may be assessed by elastography and serve as their own reference standard/baseline. In patients with non-alcoholic liver disease, the values suggesting compensated advanced chronic liver disease (cACLD) may be lower, and patients may need additional testing with elasticity results of 7-9 kPa. Please note that abnormal hepatic elasticity and shear wave velocities may also be identified in clinical settings other than with hepatic fibrosis, such as: acute hepatitis, elevated right heart and central venous pressures including use of beta blockers, veno-occlusive disease (Budd-Chiari), infiltrative processes such as mastocytosis/amyloidosis/infiltrative tumor/lymphoma, extrahepatic cholestasis, with hyperemia in the post-prandial state, and with liver transplantation. Correlation with patient history, laboratory data, and clinical condition recommended. Diagnostic Categories: < or =5 kPa: high probability of being normal < or =9 kPa: in the absence of other known clinical signs, rules out cACLD >9 kPa and ?13 kPa: suggestive of cACLD, but needs further testing >13 kPa: highly suggestive of cACLD > or =17 kPa: highly suggestive of cACLD with an increased probability of clinically significant portal hypertension IMPRESSION: ULTRASOUND ABDOMEN: 1. Cholelithiasis without gallbladder wall thickening or sonographic Murphy's sign. 2. Bilateral renal cysts. 3. Poor visualization of the pancreatic head and uncinate  process with questionable heterogeneity in this region. Consider pancreatic protocol CT or MRI for further characterization. ULTRASOUND HEPATIC ELASTOGRAPHY: Median kPa:  7.2 Diagnostic category: < or = 9 kPa: in the absence of other known clinical signs, rules out cACLD Electronically Signed   By: Van Clines M.D.   On: 08/04/2020 11:24  [4 week]   Impression: 80 y/o female with multiple medical problems including anemia, Afib on Eliquis, moderate aortic stenosis, stroke, diabetes mellitus, hypertension, chronic kidney disease, hypothyroidism, nodular liver border on CT 04/2019, congestive heart failure presenting with increased weakness/dizziness, shortness of breath, cough.  GI consulted for anemia.  Recent admission for acute on chronic anemia, heme positive stools, suspicion for  melena ultimately found to have nonbleeding cratered ulcer in the stomach, biopsies benign with no evidence of H. pylori or dysplasia.  She also had duodenitis.  Reported no NSAIDs or aspirin use.  H. pylori serologies planned but not yet carried out.  Plans for follow-up EGD in 3 months to assess healing.  Prior remote colonoscopy reportedly normal in [place], reports not viewable.  Patient presenting now with acute on chronic anemia.  Hemoglobin 9.1 at time of discharge on July 23.  Received 4 units of packed red blood cells that admission.  Hemoglobin 8.7 August 3.  Presented today with hemoglobin of 6.8.  Previous anemia work-up suggestive of anemia of chronic disease with a likely element of iron deficiency based on low serum iron, low iron saturations.  Ferritin low normal at twelve 1 month ago.  Patient reports stools were dark on iron.  Small-volume tissue paper hematochezia x1 on Tuesday. Rectal bleeding likely benign anorectal source and not cause of decline in her Hgb. Suspect ongoing bleeding from gastric ulcer in setting of Eliquis or potentially from other proximal GI source.   Bilateral pulmonary  opacities with interval progression since 07/24/20 CXR. Edema vs multilobar pneumonia.  Patient with evidence of lower extremity edema/edema to bilateral thighs.  Nodular liver border 04/2019 CT but no evidence of varices or portal hypertensive gastropathy on recent EGD.  Discordant results with recent elastography.  Outpatient MRCP pending.  Questionable heterogeneity in the region of the pancreatic head, poorly visualized on ultrasound, MRCP pending for further evaluation as an outpatient.  Plan: Transfuse packed red blood cells as needed. Continue pantoprazole 40 mg IV every 12 hours. Without overt GI bleeding, hold off on SBP prophylaxis. Patient will need EGD to verify ulcer healing, currently planned for October.  Repeat EGD at this time without overt bleeding would likely not add any additional benefit.  Previous history of stroke, A. fib with significant risk of clot/stroke off Eliquis. Will need to weigh risks vs benefits of ongoing anticoagulation. Would hope with ulcer healing, her risk of ongoing bleeding could be minimized. If further overt GI bleeding noted, may require repeat EGD +/- capsule study.   We would like to thank you for the opportunity to participate in the care of Beth Padilla.  Laureen Ochs. Bernarda Caffey [facility] [phone] 8/18/202212:34 PM    LOS: 0 days

## 2020-09-02 NOTE — ED Notes (Signed)
Date and time results received: 09/02/20 0526  Test: Hgb Critical Value: 6.8  Name of Provider Notified: Mesner, MD  Orders Received? Or Actions Taken?: acknowledged

## 2020-09-02 NOTE — ED Provider Notes (Signed)
[facility] EMERGENCY DEPARTMENT Provider Note   CSN: [id number] Arrival date & time: 09/02/20  0221     History Chief Complaint  Patient presents with   Shortness of Breath    Beth Padilla is a 80 y.o. female.  Patient here for dyspnea.  Patient states that she has had this ever since she had COVID but seems to have worsened in the last couple days.  She states that she was recently admitted and was told to stop her Lasix at that time.  She states that she gained about 3 or 4 pounds.  Has had some dark stools.  No fever but does have a cough.   Shortness of Breath     Past Medical History:  Diagnosis Date   Anemia    Atrial fibrillation ([name])    Depression    Diabetes mellitus    Hyperlipidemia 12/15/2010   Hypertension    Hypothyroidism 12/15/2010   Pneumonia    Renal disorder     Patient Active Problem List   Diagnosis Date Noted   Symptomatic anemia 09/02/2020   Epigastric discomfort 08/18/2020   Pancreatic abnormality 08/18/2020   Constipation 08/18/2020   Acute GI bleeding 08/03/2020   H/O: CVA (cerebrovascular accident)-- Has Afib 08/03/2020   CKD (chronic kidney disease), stage IV ([place]) 08/03/2020   Heme positive stool    Black stool    Acute on chronic diastolic congestive heart failure ([place]) 07/12/9483   Acute diastolic CHF (congestive heart failure) ([name]) 07/22/2020   Abdominal pain 05/03/2019   Nausea & vomiting 05/03/2019   Depression 05/03/2019   Lobar pneumonia ([name]) 02/14/2018   Chronic diastolic HF (heart failure) ([name]) 02/13/2018   Anemia of chronic disease 02/13/2018   Uncontrolled type 2 diabetes mellitus with hyperglycemia ([name]) 02/13/2018   Acute respiratory failure with hypoxia ([place]) 02/13/2018   Paroxysmal atrial fibrillation ([name]) 02/19/2015   CVA (cerebral infarction)    Elevated troponin    Stroke ([name]) 07/18/2014   Acute renal failure superimposed on stage 4 chronic kidney disease ([name]) 09/16/2013   CHF (congestive heart  failure) ([name]) 09/14/2013   Bradycardia 09/14/2013   UTI (urinary tract infection) 09/14/2013   Type 2 diabetes with nephropathy ([place]) 12/15/2010   HTN (hypertension) 12/15/2010   Normocytic anemia 12/15/2010   Hypothyroidism 12/15/2010   Hyperlipidemia 12/15/2010    Past Surgical History:  Procedure Laterality Date   ABDOMINAL HYSTERECTOMY     BIOPSY  08/05/2020   Procedure: BIOPSY;  Surgeon: [name], DO;  Location: [facility] ENDO [facility];  Service: Endoscopy;;   EP IMPLANTABLE DEVICE N/A 07/21/2014   Procedure: Loop Recorder Insertion;  Surgeon: [name], MD;  Location: [place] CV LAB;  Service: Cardiovascular;  Laterality: N/A;   ESOPHAGOGASTRODUODENOSCOPY (EGD) WITH PROPOFOL N/A 08/05/2020   Procedure: ESOPHAGOGASTRODUODENOSCOPY (EGD) WITH PROPOFOL;  Surgeon: [name], DO;  Location: [facility] ENDO SUITE;  Service: Endoscopy;  Laterality: N/A;   implantable loop recorder removal  10/14/2018   MDT Reveal LINQ removed in office by Dr [name]   TEE WITHOUT CARDIOVERSION N/A 07/21/2014   Procedure: TRANSESOPHAGEAL ECHOCARDIOGRAM (TEE);  Surgeon: [name], MD;  Location: [facility] ENDOSCOPY;  Service: Cardiovascular;  Laterality: N/A;     OB History   No obstetric history on file.     Family History  Problem Relation Age of Onset   Stroke Mother    Cancer Sister     Social History   Tobacco Use   Smoking status: Never   Smokeless  tobacco: Never  Substance Use Topics   Alcohol use: No   Drug use: No    Home Medications Prior to Admission medications   Medication Sig Start Date End Date Taking? Authorizing Provider  acetaminophen (TYLENOL) 650 MG CR tablet Take 650 mg by mouth every 8 (eight) hours as needed for pain.    [provider]  amLODipine (NORVASC) 5 MG tablet Take 5 mg by mouth daily. 08/02/20   [provider]  apixaban (ELIQUIS) 2.5 MG TABS tablet Take 2.5 mg by mouth 2 (two) times daily.    [provider]  atorvastatin  (LIPITOR) 80 MG tablet Take 1 tablet (80 mg total) by mouth every evening. 07/22/14   [name], MD  busPIRone (BUSPAR) 10 MG tablet Take 10 mg by mouth 2 (two) times daily. 07/14/20   [provider]  [employer] DICLOFENAC SODIUM 1 % GEL Apply 2 g topically 4 (four) times daily as needed (pain). 07/08/20   [provider]  dicyclomine (BENTYL) 10 MG capsule Take 1 capsule (10 mg total) by mouth every 12 (twelve) hours as needed (abdominal pain). 08/18/20   [name], MD  escitalopram (LEXAPRO) 10 MG tablet Take 10 mg by mouth daily.    [provider]  ferrous sulfate 325 (65 FE) MG tablet Take 1 tablet by mouth 2 (two) times daily. 11/09/17   [provider]  furosemide (LASIX) 20 MG tablet Take 2 tablets (40 mg total) by mouth daily. 07/26/20   Barton Dubois, MD  gabapentin (NEURONTIN) 300 MG capsule Take 1 capsule by mouth daily as needed (nerve pain). 01/03/18   [provider]  isosorbide dinitrate (ISORDIL) 30 MG tablet Take 30 mg by mouth 3 (three) times daily.  09/18/15   [provider]  levothyroxine (SYNTHROID, LEVOTHROID) 100 MCG tablet Take 125 mcg by mouth daily before breakfast.    [provider]  pantoprazole (PROTONIX) 40 MG tablet Take 1 tablet (40 mg total) by mouth 2 (two) times daily. 08/18/20 09/17/20  [name], MD  sodium bicarbonate 650 MG tablet Take 1,300 mg by mouth 2 (two) times daily.    [provider]  Vitamins A & D (VITAMIN A & D) ointment Apply 1 application topically as needed for dry skin. 11/09/17   [provider]    Allergies    Ampicillin  Review of Systems   Review of Systems  Respiratory:  Positive for shortness of breath.   All other systems reviewed and are negative.  Physical Exam Updated Vital Signs BP (!) 172/92   Pulse 64   Temp 98.1 F (36.7 C)   Resp (!) 26   Ht 5\' 7"  (1.702 m)   Wt 77.1 kg   SpO2 94%   BMI 26.63 kg/m   Physical  Exam Vitals and nursing note reviewed.  Constitutional:      Appearance: She is well-developed.  HENT:     Head: Normocephalic and atraumatic.  Cardiovascular:     Rate and Rhythm: Normal rate and regular rhythm.  Pulmonary:     Effort: Tachypnea present. No respiratory distress.     Breath sounds: No stridor. Rales present.  Abdominal:     General: There is no distension.     Palpations: There is no hepatomegaly or splenomegaly.     Tenderness: There is no abdominal tenderness.  Musculoskeletal:        General: Normal range of motion.     Cervical back: Normal range of  motion.     Right lower leg: Edema present.     Left lower leg: Edema present.  Neurological:     General: No focal deficit present.     Mental Status: She is alert.    ED Results / Procedures / Treatments   Labs (all labs ordered are listed, but only abnormal results are displayed) Labs Reviewed  CBC WITH DIFFERENTIAL/PLATELET - Abnormal; Notable for the following components:      Result Value   RBC 2.40 (*)    Hemoglobin 6.8 (*)    HCT 22.2 (*)    RDW 19.9 (*)    Platelets 571 (*)    Neutro Abs 9.2 (*)    All other components within normal limits  COMPREHENSIVE METABOLIC PANEL - Abnormal; Notable for the following components:   Potassium 5.8 (*)    Glucose, Bld 267 (*)    BUN 60 (*)    Creatinine, Ser 2.00 (*)    Calcium 8.0 (*)    Albumin 2.7 (*)    GFR, Estimated 25 (*)    All other components within normal limits  BRAIN NATRIURETIC PEPTIDE - Abnormal; Notable for the following components:   B Natriuretic Peptide 1,368.0 (*)    All other components within normal limits  RESP PANEL BY RT-PCR (FLU A&B, COVID) ARPGX2  PROTIME-INR  POC OCCULT BLOOD, ED  TYPE AND SCREEN  TROPONIN I (HIGH SENSITIVITY)  TROPONIN I (HIGH SENSITIVITY)    EKG None  Radiology DG Chest 2 View  Result Date: 09/02/2020 CLINICAL DATA:  Shortness of breath. EXAM: CHEST - 2 VIEW COMPARISON:  Chest radiograph dated  07/24/2020. FINDINGS: Bilateral pulmonary opacities predominantly involving the mid to lower lobes most concerning for multilobar pneumonia. Overall there has been interval progression of the opacities compared to prior radiograph. Edema is not excluded. There is no pleural effusion pneumothorax. Able cardiac silhouette. Atherosclerotic calcification of the aorta. No acute osseous pathology. Degenerative changes of the shoulders and spine. IMPRESSION: Interval progression of bilateral pulmonary opacities most concerning for multilobar pneumonia. Electronically Signed   By: Anner Crete M.D.   On: 09/02/2020 03:04    Procedures .Critical Care  Date/Time: 09/02/2020 6:42 AM Performed by: Merrily Pew, MD Authorized by: Merrily Pew, MD   Critical care provider statement:    Critical care time (minutes):  45   Critical care was necessary to treat or prevent imminent or life-threatening deterioration of the following conditions:  Respiratory failure and circulatory failure   Critical care was time spent personally by me on the following activities:  Discussions with consultants, evaluation of patient's response to treatment, examination of patient, ordering and performing treatments and interventions, ordering and review of laboratory studies, ordering and review of radiographic studies, pulse oximetry, re-evaluation of patient's condition, obtaining history from patient or surrogate and review of old charts   Medications Ordered in ED Medications  albuterol (VENTOLIN HFA) 108 (90 Base) MCG/ACT inhaler 2 puff (has no administration in time range)  furosemide (LASIX) injection 80 mg (80 mg Intravenous Given 09/02/20 0638)  labetalol (NORMODYNE) injection 5 mg (5 mg Intravenous Given 09/02/20 2778)    ED Course  I have reviewed the triage vital signs and the nursing notes.  Pertinent labs & imaging results that were available during my care of the patient were reviewed by me and considered in my  medical decision making (see chart for details).    MDM Rules/Calculators/A&P  Patient has anemia likely related to a slow GI bleed which she had in the past.  She also has pulmonary edema on the chest x-ray and significant lower extremity edema exacerbating her shortness of breath.  She is pretty tachypneic with oxygen's in the low 90s.  We will hold on giving her any blood for now just diurese her today does not remain cause for her issues but may need blood while she is hospitalized.  Discussed with hospitalist for admission.  . Final Clinical Impression(s) / ED Diagnoses Final diagnoses:  Anemia, unspecified type  Acute on chronic congestive heart failure, unspecified heart failure type [facility])    Rx / DC Orders ED Discharge Orders     None        Lillith Mcneff, Corene Cornea, MD 09/02/20 [phone]

## 2020-09-03 ENCOUNTER — Inpatient Hospital Stay (HOSPITAL_COMMUNITY): Payer: Medicare Other | Admitting: Anesthesiology

## 2020-09-03 ENCOUNTER — Encounter (HOSPITAL_COMMUNITY)
Admission: EM | Disposition: A | Discharge status: Home Health | Payer: Self-pay | Source: Home / Self Care | Attending: Family Medicine

## 2020-09-03 ENCOUNTER — Other Ambulatory Visit: Payer: Self-pay

## 2020-09-03 ENCOUNTER — Encounter (HOSPITAL_COMMUNITY): Payer: Self-pay | Admitting: Family Medicine

## 2020-09-03 DIAGNOSIS — R0602 Shortness of breath: Secondary | ICD-10-CM

## 2020-09-03 DIAGNOSIS — K254 Chronic or unspecified gastric ulcer with hemorrhage: Secondary | ICD-10-CM

## 2020-09-03 DIAGNOSIS — K921 Melena: Secondary | ICD-10-CM

## 2020-09-03 DIAGNOSIS — K2970 Gastritis, unspecified, without bleeding: Secondary | ICD-10-CM

## 2020-09-03 DIAGNOSIS — D509 Iron deficiency anemia, unspecified: Secondary | ICD-10-CM

## 2020-09-03 DIAGNOSIS — R600 Localized edema: Secondary | ICD-10-CM

## 2020-09-03 HISTORY — PX: ESOPHAGOGASTRODUODENOSCOPY (EGD) WITH PROPOFOL: SHX5813

## 2020-09-03 HISTORY — PX: GIVENS CAPSULE STUDY: SHX5432

## 2020-09-03 LAB — TYPE AND SCREEN
ABO/RH(D): A POS
Antibody Screen: NEGATIVE
Unit division: 0
Unit division: 0

## 2020-09-03 LAB — BASIC METABOLIC PANEL
Anion gap: 6 (ref 5–15)
BUN: 59 mg/dL — ABNORMAL HIGH (ref 8–23)
CO2: 27 mmol/L (ref 22–32)
Calcium: 8.1 mg/dL — ABNORMAL LOW (ref 8.9–10.3)
Chloride: 107 mmol/L (ref 98–111)
Creatinine, Ser: 1.91 mg/dL — ABNORMAL HIGH (ref 0.44–1.00)
GFR, Estimated: 26 mL/min — ABNORMAL LOW (ref >60)
Glucose, Bld: 174 mg/dL — ABNORMAL HIGH (ref 70–99)
Potassium: 4.9 mmol/L (ref 3.5–5.1)
Sodium: 140 mmol/L (ref 135–145)

## 2020-09-03 LAB — BPAM RBC
Blood Product Expiration Date: 202209182359
Blood Product Expiration Date: 202209212359
ISSUE DATE / TIME: 202208181032
ISSUE DATE / TIME: 202208181500
Unit Type and Rh: 6200
Unit Type and Rh: 6200

## 2020-09-03 LAB — GLUCOSE, CAPILLARY
Glucose-Capillary: 167 mg/dL — ABNORMAL HIGH (ref 70–99)
Glucose-Capillary: 141 mg/dL — ABNORMAL HIGH (ref 70–99)
Glucose-Capillary: 137 mg/dL — ABNORMAL HIGH (ref 70–99)
Glucose-Capillary: 182 mg/dL — ABNORMAL HIGH (ref 70–99)
Glucose-Capillary: 222 mg/dL — ABNORMAL HIGH (ref 70–99)

## 2020-09-03 LAB — CBC
HCT: 28.3 % — ABNORMAL LOW (ref 36.0–46.0)
Hemoglobin: 8.9 g/dL — ABNORMAL LOW (ref 12.0–15.0)
MCH: 29.1 pg (ref 26.0–34.0)
MCHC: 31.4 g/dL (ref 30.0–36.0)
MCV: 92.5 fL (ref 80.0–100.0)
Platelets: 446 10*3/uL — ABNORMAL HIGH (ref 150–400)
RBC: 3.06 MIL/uL — ABNORMAL LOW (ref 3.87–5.11)
RDW: 17.6 % — ABNORMAL HIGH (ref 11.5–15.5)
WBC: 7.5 10*3/uL (ref 4.0–10.5)
nRBC: 0 % (ref 0.0–0.2)

## 2020-09-03 LAB — PROTIME-INR
INR: 1.4 — ABNORMAL HIGH (ref 0.8–1.2)
Prothrombin Time: 17.5 seconds — ABNORMAL HIGH (ref 11.4–15.2)

## 2020-09-03 SURGERY — ESOPHAGOGASTRODUODENOSCOPY (EGD) WITH PROPOFOL
Anesthesia: General

## 2020-09-03 MED ORDER — LACTATED RINGERS IV SOLN: INTRAVENOUS | Status: DC

## 2020-09-03 MED ORDER — STERILE WATER FOR IRRIGATION IR SOLN
Status: DC | PRN
  Administered 2020-09-03: 1.5 mL

## 2020-09-03 MED ORDER — PROPOFOL 10 MG/ML IV BOLUS
INTRAVENOUS | Status: DC | PRN
  Administered 2020-09-03: 20 mg via INTRAVENOUS
  Administered 2020-09-03: 80 mg via INTRAVENOUS
  Administered 2020-09-03: 20 mg via INTRAVENOUS
  Administered 2020-09-03: 30 mg via INTRAVENOUS

## 2020-09-03 MED ORDER — SODIUM CHLORIDE 0.9 % IV SOLN: INTRAVENOUS | Status: DC

## 2020-09-03 MED ORDER — LIDOCAINE HCL (CARDIAC) PF 100 MG/5ML IV SOSY
PREFILLED_SYRINGE | INTRAVENOUS | Status: DC | PRN
  Administered 2020-09-03: 50 mg via INTRAVENOUS

## 2020-09-03 NOTE — Progress Notes (Signed)
Daily Progress Note   Patient Name: Beth Padilla       Date: 09/03/2020 DOB: 08-22-1940  Age: 80 y.o. MRN#: [id number] Attending Physician: Deatra James, MD Primary Care Physician: Kendrick Ranch, MD Admit Date: 09/02/2020 Length of Stay: 1 day  Reason for Consultation/Follow-up: Establishing goals of care  HPI/Patient Profile:  80 y.o. female  with past medical history of pA. Fib on Eliquis, chronic anemia, depression, DM II, HTN, HLD, hyperlipidemia, hypertension, hypothyroidism, history of CVA-2016, CKD stage IV, diastolic CHF, GI bleed with gastric ulcers in July 2022, moderate aortic stenosis, noted nodular liver/possible Cirrhosis on CT April 2021 admitted on 09/02/2020 with dyspnea and 3-4 lb weight gain in last few days. She noted dyspnea with progressive worsening since COVID diagnosis but significantly worse last few days.   Recently at another facility (?[facility]?) where she was told to stop her Lasix. ER work-up found significant anemia (hgb 6.8, elevated BUN) likely known gastric ulcers, cHF exacerbation with BNP 1368, chronic CKD IV (at baseline Cr 2.0).   Palliative Medicine was consulted for [place] discussion and CODE STATUS.  Current Medications: Scheduled Meds:  . atorvastatin  80 mg Oral QPM  . busPIRone  10 mg Oral BID  . escitalopram  10 mg Oral Daily  . ferrous sulfate  325 mg Oral BID  . furosemide  40 mg Oral Daily  . insulin aspart  0-6 Units Subcutaneous TID WC  . isosorbide dinitrate  30 mg Oral TID  . labetalol  200 mg Oral BID  . levothyroxine  125 mcg Oral QAC breakfast  . pantoprazole (PROTONIX) IV  40 mg Intravenous Q12H  . sodium bicarbonate  1,300 mg Oral BID  . sodium chloride flush  3 mL Intravenous Q12H  . sodium chloride flush  3 mL Intravenous Q12H    Continuous Infusions: . sodium chloride    . iron sucrose Stopped (09/02/20 1834)    PRN Meds: sodium chloride, acetaminophen **OR** acetaminophen, albuterol, bisacodyl,  dicyclomine, gabapentin, hydrALAZINE, HYDROmorphone (DILAUDID) injection, ipratropium, levalbuterol, ondansetron **OR** ondansetron (ZOFRAN) IV, oxyCODONE, senna-docusate, sodium chloride flush, traZODone  Subjective:   Subjective: Chart Reviewed. Updates received. Patient Assessed. Created space and opportunity for patient  and family to explore thoughts and feelings regarding current medical situation.  Today's Discussion: I met today with the patient and her daughter at the bedside.  The patient states she is feeling much better compared to yesterday after her blood transfusion.  She has a good appetite, although she was not able to eat this morning because of upper endoscopy.  Her plan to give her clear liquids for lunch and then advance as tolerated.  She is not having any pain.  Her breathing is much better and so is her swallowing.  We reviewed the results of her upper endoscopy which showed no further gastric ulcer and subsequently a Givens capsule was deployed for small bowel etiologies.  She states she was told by the doctor that she should have a colonoscopy after she gets out of the hospital.  She does not have any questions or concerns at this time.  Review of Systems  Constitutional:  Negative for appetite change and fatigue.  Respiratory:  Negative for cough and shortness of breath.   Cardiovascular:  Positive for leg swelling (improved). Negative for chest pain.  Gastrointestinal:  Negative for abdominal pain, blood in stool, nausea and vomiting.   Objective:   Vital Signs: BP (!) 163/66 (BP Location: Left Arm)   Pulse 61  Temp 97.6 F (36.4 C) (Oral)   Resp 16   Ht _0  (1.702 m)   Wt 84.7 kg   SpO2 100%   BMI 29.25 kg/m  SpO2: SpO2: 100 % O2 Device: O2 Device: Room Air O2 Flow Rate:    Physical Exam: Physical Exam Vitals and nursing note reviewed.  Constitutional:      General: She is not in acute distress.    Appearance: Normal appearance. She is not  ill-appearing.  HENT:     Head: Normocephalic and atraumatic.  Cardiovascular:     Rate and Rhythm: Normal rate and regular rhythm.  Pulmonary:     Effort: Pulmonary effort is normal. No respiratory distress.     Breath sounds: Normal breath sounds. No wheezing or rhonchi.  Abdominal:     General: Abdomen is flat.     Palpations: Abdomen is soft.     Tenderness: There is no abdominal tenderness.  Skin:    General: Skin is warm and dry.  Neurological:     General: No focal deficit present.     Mental Status: She is alert.  Psychiatric:        Mood and Affect: Mood normal.        Behavior: Behavior normal.    SpO2: SpO2: 100 % O2 Device:SpO2: 100 % O2 Flow Rate: .   IO: Intake/output summary:  Intake/Output Summary (Last 24 hours) at 09/03/2020 1304 Last data filed at 09/03/2020 1118 Gross per 24 hour  Intake 1313 ml  Output 150 ml  Net 1163 ml    LBM: Last BM Date: 09/02/20 Baseline Weight: Weight: 77.1 kg Most recent weight: Weight: 84.7 kg   Palliative Assessment/Data: 50%   Assessment & Plan:   Impression:   Symptomatic anemia  Acute respiratory failure with hypoxia (HCC)  Severe anemia  Type 2 diabetes with nephropathy (HCC)  HTN (hypertension)  Hypothyroidism  Hyperlipidemia  Acute renal failure superimposed on stage 4 chronic kidney disease (HCC)  Stroke (HCC)  Chronic diastolic HF (heart failure) (HCC)  Depression  H/O: CVA (cerebrovascular accident)-- Has Afib   Very pleasant 80 year old female with multiple chronic illnesses and significant recurrent admissions to the ER and to the hospital with dyspnea, heart failure especially since diagnosis with COVID around [facility] Day.  She seems to be struggling with edema, CHF exacerbations, acute blood loss anemia due to GI bleed, also complicated by her chronic kidney disease history of CVA, diabetes.  Overall, her long-term prognosis seems to be poor.  However, she was like continued aggressive care to try  to get home, and feel better.   Today she looks much improved, is laughing and telling jokes. No obvious discomfort or distress.  SUMMARY OF RECOMMENDATIONS   Full code Treat the treatable Full scope of care Further discussions as needed based on clinical progress  Code Status: Full code  Symptom Management: Per primary team Palliative medicine is available to assis as needed with symptom management  Prognosis: Unable to determine  Discharge Planning: To Be Determined  Discussed with: Patient, patient's daughter, nursing staff, medical staff  Thank you for allowing [place] to participate in the care of [place] PMT will continue to support holistically.  Time Total: 35 min  Visit consisted of counseling and education dealing with the complex and emotionally intense issues of symptom management and palliative care in the setting of serious and potentially life-threatening illness. Greater than 50%  of this time was spent counseling and coordinating care related to  the above assessment and plan.  Walden Field, NP Palliative Medicine Team  Team Phone # [phone] (Nights/Weekends)  09/03/2020, 1:04 PM

## 2020-09-03 NOTE — Anesthesia Postprocedure Evaluation (Signed)
Anesthesia Post Note  Patient: Beth Padilla  Procedure(s) Performed: ESOPHAGOGASTRODUODENOSCOPY (EGD) WITH PROPOFOL GIVENS CAPSULE STUDY  Patient location during evaluation: PACU Anesthesia Type: General Level of consciousness: awake and alert and oriented Pain management: pain level controlled Vital Signs Assessment: post-procedure vital signs reviewed and stable Respiratory status: spontaneous breathing and respiratory function stable Cardiovascular status: blood pressure returned to baseline and stable Postop Assessment: no apparent nausea or vomiting Anesthetic complications: no   No notable events documented.   Last Vitals:  Vitals:   09/03/20 1123 09/03/20 1130  BP: 134/81 (!) 125/97  Pulse: 60 63  Resp: 14 18  Temp: 36.6 C   SpO2:  96%    Last Pain:  Vitals:   09/03/20 1123  TempSrc:   PainSc: 0-No pain                 Hagen Bohorquez C Moncia Annas

## 2020-09-03 NOTE — Progress Notes (Signed)
Pt had givens study, pill ingested at 1119, will leave on belt for 8hrs (1911). Pt may have clear liquids in 2 hours  (1311) and 4 hours (1511) light snack (half sandwich or bowl of soup)

## 2020-09-03 NOTE — Plan of Care (Signed)
  Problem: Acute Rehab PT Goals(only PT should resolve) Goal: Patient Will Transfer Sit To/From Stand Outcome: Progressing Flowsheets (Taken 09/03/2020 0933) Patient will transfer sit to/from stand: with minimal assist Goal: Pt Will Transfer Bed To Chair/Chair To Bed Outcome: Progressing Flowsheets (Taken 09/03/2020 0933) Pt will Transfer Bed to Chair/Chair to Bed: with min assist Goal: Pt Will Ambulate Outcome: Progressing Flowsheets (Taken 09/03/2020 0933) Pt will Ambulate:  25 feet  with min guard assist  with minimal assist  with rolling walker Goal: Pt/caregiver will Perform Home Exercise Program Outcome: Progressing Flowsheets (Taken 09/03/2020 0933) Pt/caregiver will Perform Home Exercise Program:  For increased strengthening  For improved balance  Independently  9:34 AM, 09/03/20 Mearl Latin PT, DPT Physical Therapist at [facility]

## 2020-09-03 NOTE — Anesthesia Preprocedure Evaluation (Addendum)
Anesthesia Evaluation  Patient identified by MRN, date of birth, ID band Patient awake    Reviewed: Allergy & Precautions, NPO status , Patient's Chart, lab work & pertinent test results, reviewed documented beta blocker date and time   History of Anesthesia Complications Negative for: history of anesthetic complications  Airway Mallampati: II  TM Distance: >3 FB Neck ROM: Full    Dental  (+) Dental Advisory Given, Loose, Missing,    Pulmonary pneumonia, resolved,    Pulmonary exam normal breath sounds clear to auscultation       Cardiovascular Exercise Tolerance: Poor hypertension, Pt. on medications + CAD and +CHF  + dysrhythmias Atrial Fibrillation + Valvular Problems/Murmurs AS  Rhythm:Irregular Rate:Normal + Systolic murmurs [id number] 02:46:53 [place] [facility]-ED ROUTINE RECORD [id number] (78 yr) Female Black Room:APWPT Loc:906 Technician: Test [id number] of breath Vent. rate 79 BPM PR interval * ms QRS duration 74 ms QT/QTcB 384/440 ms P-R-T axes * 91 255 Atrial fibrillation Rightward axis Nonspecific ST and T wave abnormality Abnormal ECG   Neuro/Psych PSYCHIATRIC DISORDERS Depression CVA, Residual Symptoms    GI/Hepatic GERD  Medicated,Acute GI bleed   Endo/Other  diabetes, Well Controlled, Type 2Hypothyroidism   Renal/GU Renal InsufficiencyRenal disease (AKI)     Musculoskeletal negative musculoskeletal ROS (+)   Abdominal   Peds  Hematology  (+) anemia ,   Anesthesia Other Findings 1. Left ventricular ejection fraction, by estimation, is 55 to 60%. The  left ventricle has normal function. The left ventricle has no regional  wall motion abnormalities. There is mild left ventricular hypertrophy.  Left ventricular diastolic parameters  are indeterminate.  2. Right ventricular systolic function is mildly reduced. The right  ventricular size is mildly enlarged. Mildly increased  right ventricular  wall thickness.  3. Left atrial size was moderately dilated.  4. Right atrial size was mildly dilated.  5. Trivial mitral valve regurgitation.  6. AV is thickened, calcified with restricted motion. Peak and mean  pressures through the valve are 37 and 20 mm Hg resepctively AVA (VTI) is  1.2 cm2. Dimentionless index is 0.39 consistent with moderate aortic  stenosis. Since echo from Jan 2020 there is no significant change.. Aortic valve regurgitation is mild.  7. The inferior vena cava is dilated in size with <50% respiratory  variability, suggesting right atrial pressure of 15 mmHg.   Reproductive/Obstetrics negative OB ROS                           Anesthesia Physical  Anesthesia Plan  ASA: 4  Anesthesia Plan: General   Post-op Pain Management:    Induction:   PONV Risk Score and Plan: Propofol infusion  Airway Management Planned: Nasal Cannula and Natural Airway  Additional Equipment:   Intra-op Plan:   Post-operative Plan:   Informed Consent: I have reviewed the patients History and Physical, chart, labs and discussed the procedure including the risks, benefits and alternatives for the proposed anesthesia with the patient or authorized representative who has indicated his/her understanding and acceptance.     Dental advisory given  Plan Discussed with: CRNA and Surgeon  Anesthesia Plan Comments:         Anesthesia Quick Evaluation

## 2020-09-03 NOTE — Progress Notes (Signed)
PROGRESS NOTE    Patient: Beth Padilla                            PCP: Kendrick Ranch, MD                    DOB: 1940/08/29            DOA: 09/02/2020 SKA:[id number]             DOS: 09/03/2020, 12:08 PM   LOS: 1 day   Date of Service: The patient was seen and examined on 09/03/2020  Subjective:   The patient was seen and examined this morning. Stable at this time. Complaining of mild abdominal discomfort, generalized weaknesses, denies any overt rectal bleed overnight. Otherwise no issues overnight . N.p.o. Anticipating EGD today  Brief Narrative:  SHAMBRIA CAMERER is a 80 y.o. female with medical history significant of pA. 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  moderate aortic stenosis,  noted nodular liver/?? Cirrhosis on CT April 2021 Presenting with progressive shortness of breath, gaining 3-4 pounds in last few days.   Patient reports that her shortness of breath is persistently getting worse since she had COVID but has been getting worse in the past couple of days.  States she recently was at med and was told to stop her Lasix at that time.     Chronically anticoagulated on Eliquis due to her atrial fibrillation, reporting a dark stool again.  Patient had a GI work-up EGD July 2022 discovering gastric ulcers..  Supposed to follow-up with gastroenterology as an outpatient and was planned for repeat EGD with in 10 to 12 weeks.   09/02/2020 status post 2 units PRBC blood transfusion  09/03/2020 EGD  Assessment & Plan:   Principal Problem:   Symptomatic anemia Active Problems:   Acute respiratory failure with hypoxia (HCC)   Severe anemia   Type 2 diabetes with nephropathy (HCC)   HTN (hypertension)   Hypothyroidism   Hyperlipidemia   Acute renal failure superimposed on stage 4 chronic kidney  disease (HCC)   Stroke (HCC)   Chronic diastolic HF (heart failure) (HCC)   Depression   H/O: CVA (cerebrovascular accident)-- Has Afib      Acute on chronic anemia most likely due to ABLA--dark stools + heme positive/ h/o gastric ulcers -Monitor H&H, improved -Hemoglobin 6.8 >>> transfusing 2U PRBC 09/02/2020 >> 8.9 -Pros and cons of blood transfusion discussed with patient detail -Holding Eliquis   -On her previous admission and July 2022... She received total of 4 units of PRBC was discharged with a hemoglobin of 9.1.   -Continue IV Protonix   -Consulting GI for further evaluation--get input   N.p.o.  -09/03/2020 EGD >>>    -Nonbleeding gastric ulcer and duodenitis ulcer-EGD 08/05/2020: -Continue IV Protonix, appreciate GI input  (On last admission patient was treated for possible liver cirrhosis, likely treated for SBP   -CT on this admission revealed nodular liver possible liver cirrhosis, LFTs within normal limits,, no signs of portal hypertension, -Ultrasound found no nodular hepatic -GI considering pancreatic CT with contrast in the future versus EUS (contrast not indicated due to CKD stage IV)     Dyspnea  -Much improved -Likely CHF exacerbation, profound anemia -As needed supplemental oxygen, -Currently maintaining O2 sat 97% on room  air -We will monitor closely -We will continue to treat underlying causes of anemia and CHF -Continue Lasix IV .Marland KitchenMarland Kitchen We will switch to p.o. in a.m.   PAFIb--history of prior stroke in 2016- -Rate well controlled -On Eliquis, with holding due to profound anemia, possible GI bleed -Very high risk for another stroke, stable - 1.2 % annual risk of stroke     CKD stage - IV --Creatinine is 2.0 which is close to patient's baseline BUN is 72, GFR is 25 -Creatinine 2.0, >>> 1.99 today -Renal function appears to be close to her baseline,   -We will avoid nephrotoxins,   DM II -last A1c 6.8 reflecting good diabetic control  PTA -Continue strict diabetic diet, currently no medication unless   HFpEF--history of diastolic CHF -Elevated BNP 1368 -Monitoring I's and O's, daily weight -BNP 1368, -Apparently patient's Lasix was held, she was supposed to be on Diovan and acetazolamide It appears that she has not been taking them EF on echo from 07/22/2020 was 55 to 60% Recommending close follow-up with cardiologist regarding restarting above medications -Patient has received 80 mg of Lasix, with transfusion we will continue 20 mg of Lasix before in between after PRBC transfusions total of 60 mg  more, and 40 mg  IV daily  -Continue Lasix IV anticipating switch to p.o. in a.m.   Anxiety and depression--- comfortable, stable we will, continue Lexapro and buspirone   Hypothyroidism--- stable continue levothyroxine 100 mcg daily      HTN -Elevated BP, restarting home medication -Home medications were amlodipine 5 mg daily, hydralazine 50 mg 3 times daily, isosorbide 30 mg  -Resuming isosorbide, added labetalol (anticipating discontinuing amlodipine and hydralazine) If BP remains stable   History of prior stroke- -- in the setting of A. fib, please see #2 above -Continue Lipitor,  -Eliquis on hold as above            Cultures:  -none  Antimicrobial: -none    Consults called:  GI -------------------------------------------------------------------------------------------------------------------------------------------- DVT prophylaxis: SCD/Compression stockings Code Status:   Code Status: Full Code     Admission status: Patient will be admitted as Inpatient, with a greater than 2 midnight length of stay. Level of care: Telemetry     Family Communication:  none at bedside  (The above findings and plan of care has been discussed with patient in detail, the patient expressed understanding and agreement of above plan)   --------------------------------------------------------------------------------------------------------------------------------------------------   Disposition Plan: >3 days Status is: Inpatient   Remains inpatient appropriate because:Hemodynamically unstable, IV treatments appropriate due to intensity of illness or inability to take PO, and Inpatient level of care appropriate due to severity of illness   Dispo: The patient is from: Home              Anticipated d/c is to: Home with home health in 1-2 days              Patient currently is not medically stable to d/c.              Difficult to place patient No       ----------------------------------------------------------------------------------------------------------------------------------------------- Nutritional status:  The patient's BMI is: Body mass index is 29.25 kg/m. I agree with the assessment and plan as outlined below: Nutrition Status:           Level of care: Telemetry   Procedures:     EGD 09/03/2020  Antimicrobials:  Anti-infectives (From admission, onward)    None  Medication:   atorvastatin  80 mg Oral QPM   busPIRone  10 mg Oral BID   escitalopram  10 mg Oral Daily   ferrous sulfate  325 mg Oral BID   furosemide  40 mg Oral Daily   insulin aspart  0-6 Units Subcutaneous TID WC   isosorbide dinitrate  30 mg Oral TID   labetalol  200 mg Oral BID   levothyroxine  125 mcg Oral QAC breakfast   pantoprazole (PROTONIX) IV  40 mg Intravenous Q12H   sodium bicarbonate  1,300 mg Oral BID   sodium chloride flush  3 mL Intravenous Q12H   sodium chloride flush  3 mL Intravenous Q12H    sodium chloride, acetaminophen **OR** acetaminophen, albuterol, bisacodyl, dicyclomine, gabapentin, hydrALAZINE, HYDROmorphone (DILAUDID) injection, ipratropium, levalbuterol, ondansetron **OR** ondansetron (ZOFRAN) IV, oxyCODONE, senna-docusate, sodium chloride flush, traZODone   Objective:    Vitals:   09/03/20 1016 09/03/20 1123 09/03/20 1130 09/03/20 1207  BP: (!) 148/65 134/81 (!) 125/97 (!) 163/66  Pulse: 71 60 63 61  Resp: (!) 24 14 18 16   Temp: 98.3 F (36.8 C) 97.9 F (36.6 C)  97.6 F (36.4 C)  TempSrc: Oral   Oral  SpO2: 93%  96% 100%  Weight: 84.7 kg     Height: 5\' 7"  (1.702 m)       Intake/Output Summary (Last 24 hours) at 09/03/2020 1208 Last data filed at 09/03/2020 1118 Gross per 24 hour  Intake 1313 ml  Output 150 ml  Net 1163 ml   Filed Weights   09/02/20 1952 09/03/20 0500 09/03/20 1016  Weight: 83.3 kg 84.7 kg 84.7 kg     Examination:   Physical Exam  Constitution:  Alert, cooperative, no distress,  Appears calm and comfortable  Psychiatric: Normal and stable mood and affect, cognition intact,   HEENT: Normocephalic, PERRL, otherwise with in Normal limits  Chest:Chest symmetric Cardio vascular:  S1/S2, RRR, No murmure, No Rubs or Gallops  pulmonary: Clear to auscultation bilaterally, respirations unlabored, negative wheezes / crackles Abdomen: Soft, non-tender, non-distended, bowel sounds,no masses, no organomegaly Muscular skeletal: Limited exam - in bed, able to move all 4 extremities, Normal strength,  Neuro: CNII-XII intact. , normal motor and sensation, reflexes intact  Extremities: No pitting edema lower extremities, +2 pulses  Skin: Dry, warm to touch, negative for any Rashes, No open wounds Wounds: per nursing documentation    ------------------------------------------------------------------------------------------------------------------------------------------    LABs:  CBC Latest Ref Rng & Units 09/03/2020 09/02/2020 08/18/2020  WBC 4.0 - 10.5 K/uL 7.5 10.4 12.3(H)  Hemoglobin 12.0 - 15.0 g/dL 8.9(L) 6.8(LL) 8.7(L)  Hematocrit 36.0 - 46.0 % 28.3(L) 22.2(L) 27.9(L)  Platelets 150 - 400 K/uL 446(H) 571(H) 261   CMP Latest Ref Rng & Units 09/03/2020 09/02/2020 08/04/2020  Glucose 70 - 99 mg/dL 174(H) 267(H) 190(H)  BUN 8 - 23  mg/dL 59(H) 60(H) 74(H)  Creatinine 0.44 - 1.00 mg/dL 1.91(H) 2.00(H) 1.98(H)  Sodium 135 - 145 mmol/L 140 137 135  Potassium 3.5 - 5.1 mmol/L 4.9 5.8(H) 4.7  Chloride 98 - 111 mmol/L 107 106 106  CO2 22 - 32 mmol/L 27 24 23   Calcium 8.9 - 10.3 mg/dL 8.1(L) 8.0(L) 7.2(L)  Total Protein 6.5 - 8.1 g/dL - 7.2 5.5(L)  Total Bilirubin 0.3 - 1.2 mg/dL - 0.7 0.7  Alkaline Phos 38 - 126 U/L - 107 38  AST 15 - 41 U/L - 21 15  ALT 0 - 44 U/L - 24 15  Micro Results Recent Results (from the past 240 hour(s))  Resp Panel by RT-PCR (Flu A&B, Covid) Nasopharyngeal Swab     Status: None   Collection Time: 09/02/20  6:28 AM   Specimen: Nasopharyngeal Swab; Nasopharyngeal(NP) swabs in vial transport medium  Result Value Ref Range Status   SARS Coronavirus 2 by RT PCR NEGATIVE NEGATIVE Final    Comment: (NOTE) SARS-CoV-2 target nucleic acids are NOT DETECTED.  The SARS-CoV-2 RNA is generally detectable in upper respiratory specimens during the acute phase of infection. The lowest concentration of SARS-CoV-2 viral copies this assay can detect is 138 copies/mL. A negative result does not preclude SARS-Cov-2 infection and should not be used as the sole basis for treatment or other patient management decisions. A negative result may occur with  improper specimen collection/handling, submission of specimen other than nasopharyngeal swab, presence of viral mutation(s) within the areas targeted by this assay, and inadequate number of viral copies(<138 copies/mL). A negative result must be combined with clinical observations, patient history, and epidemiological information. The expected result is Negative.  Fact Sheet for Patients:  EntrepreneurPulse.com.au  Fact Sheet for Healthcare Providers:  IncredibleEmployment.be  This test is no t yet approved or cleared by the [place] FDA and  has been authorized for detection and/or diagnosis of SARS-CoV-2  by FDA under an Emergency Use Authorization (EUA). This EUA will remain  in effect (meaning this test can be used) for the duration of the COVID-19 declaration under Section 564(b)(1) of the Act, 21 U.S.C.section 360bbb-3(b)(1), unless the authorization is terminated  or revoked sooner.       Influenza A by PCR NEGATIVE NEGATIVE Final   Influenza B by PCR NEGATIVE NEGATIVE Final    Comment: (NOTE) The Xpert Xpress SARS-CoV-2/FLU/RSV plus assay is intended as an aid in the diagnosis of influenza from Nasopharyngeal swab specimens and should not be used as a sole basis for treatment. Nasal washings and aspirates are unacceptable for Xpert Xpress SARS-CoV-2/FLU/RSV testing.  Fact Sheet for Patients: EntrepreneurPulse.com.au  Fact Sheet for Healthcare Providers: IncredibleEmployment.be  This test is not yet approved or cleared by the [place] FDA and has been authorized for detection and/or diagnosis of SARS-CoV-2 by FDA under an Emergency Use Authorization (EUA). This EUA will remain in effect (meaning this test can be used) for the duration of the COVID-19 declaration under Section 564(b)(1) of the Act, 21 U.S.C. section 360bbb-3(b)(1), unless the authorization is terminated or revoked.  Performed at [facility], [address] [place], [place] [address]     Radiology Reports DG Chest 2 View  Result Date: 09/02/2020 CLINICAL DATA:  Shortness of breath. EXAM: CHEST - 2 VIEW COMPARISON:  Chest radiograph dated 07/24/2020. FINDINGS: Bilateral pulmonary opacities predominantly involving the mid to lower lobes most concerning for multilobar pneumonia. Overall there has been interval progression of the opacities compared to prior radiograph. Edema is not excluded. There is no pleural effusion pneumothorax. Able cardiac silhouette. Atherosclerotic calcification of the aorta. No acute osseous pathology. Degenerative changes of the shoulders and  spine. IMPRESSION: Interval progression of bilateral pulmonary opacities most concerning for multilobar pneumonia. Electronically Signed   By: Anner Crete M.D.   On: 09/02/2020 03:04    SIGNED: Deatra James, MD, FHM. [facility],  Pager (please use [email or web address] to page/text) Please use Epic Secure Chat for non-urgent communication (7AM-7PM)  If 7PM-7AM, please contact night-coverage [email or web address], 09/03/2020, 12:08 PM

## 2020-09-03 NOTE — Plan of Care (Signed)
  Problem: Education: Goal: Knowledge of General Education information will improve Description Including pain rating scale, medication(s)/side effects and non-pharmacologic comfort measures Outcome: Progressing   Problem: Activity: Goal: Risk for activity intolerance will decrease Outcome: Progressing   Problem: Safety: Goal: Ability to remain free from injury will improve Outcome: Progressing   

## 2020-09-03 NOTE — Transfer of Care (Signed)
Immediate Anesthesia Transfer of Care Note  Patient: Beth Padilla  Procedure(s) Performed: ESOPHAGOGASTRODUODENOSCOPY (EGD) WITH PROPOFOL GIVENS CAPSULE STUDY  Patient Location: PACU  Anesthesia Type:General  Level of Consciousness: drowsy  Airway & Oxygen Therapy: Patient Spontanous Breathing  Post-op Assessment: Report given to RN and Post -op Vital signs reviewed and stable  Post vital signs: Reviewed and stable  Last Vitals:  Vitals Value Taken Time  BP 134/81   Temp    Pulse 65   Resp 12   SpO2 96%     Last Pain:  Vitals:   09/03/20 1059  TempSrc:   PainSc: 0-No pain         Complications: No notable events documented.

## 2020-09-03 NOTE — Op Note (Addendum)
[facility] Patient Name: Beth Padilla Procedure Date: 09/03/2020 10:44 AM MRN: [id number] Date of Birth: 1940-07-15 Attending MD: Maylon Peppers ,  CSN: [id number] Age: 80 Admit Type: Outpatient Procedure:                Upper GI endoscopy Indications:              Iron deficiency anemia, Melena Providers:                Maylon Peppers, Charlsie Quest. Theda Sers RN, RN, Randa Spike, Technician Referring MD:              Medicines:                Monitored Anesthesia Care Complications:            No immediate complications. Estimated Blood Loss:     Estimated blood loss: none. Procedure:                Pre-Anesthesia Assessment:                           - Prior to the procedure, a History and Physical                            was performed, and patient medications, allergies                            and sensitivities were reviewed. The patient's                            tolerance of previous anesthesia was reviewed.                           - The risks and benefits of the procedure and the                            sedation options and risks were discussed with the                            patient. All questions were answered and informed                            consent was obtained.                           - ASA Grade Assessment: III - A patient with severe                            systemic disease.                           - Adequate visualization was aided with the use of                            a transparent cap attached to  the distal part of                            the endoscope.                           After obtaining informed consent, the endoscope was                            passed under direct vision. Throughout the                            procedure, the patient's blood pressure, pulse, and                            oxygen saturations were monitored continuously. The                            [id number]-[id number]  ([id number]) scope was introduced through the                            mouth, and advanced to the second part of duodenum.                            The upper GI endoscopy was accomplished without                            difficulty. The patient tolerated the procedure                            well. Scope In: 11:02:30 AM Scope Out: 11:18:01 AM Total Procedure Duration: 0 hours 15 minutes 31 seconds  Findings:      The examined esophagus was normal.      Diffuse moderate inflammation characterized by congestion (edema) was       found in the gastric antrum. No presence of ulcerations or hematin was       observed.      The examined duodenum was normal. Endoscopic capsule was successfully       deployed in the duodenal bulb. Impression:               - Normal esophagus.                           - Gastritis.                           - Normal examined duodenum. Capsule deployed                           - No specimens collected. Moderate Sedation:      Per Anesthesia Care Recommendation:           - Return patient to hospital ward for ongoing care.                           - Resume previous diet.                           -  Continue PPI BID for now.                           - Trend H/H daily                           - Follow capsule endoscopy report. Procedure Code(s):        --- Professional ---                           [phone], Esophagogastroduodenoscopy, flexible,                            transoral; diagnostic, including collection of                            specimen(s) by brushing or washing, when performed                            (separate procedure) Diagnosis Code(s):        --- Professional ---                           K29.70, Gastritis, unspecified, without bleeding                           D50.9, Iron deficiency anemia, unspecified                           K92.1, Melena (includes Hematochezia) CPT copyright 2019 [employer]. All rights  reserved. The codes documented in this report are preliminary and upon coder review may  be revised to meet current compliance requirements. Maylon Peppers, MD Maylon Peppers,  09/03/2020 11:27:52 AM This report has been signed electronically. Number of Addenda: 0

## 2020-09-03 NOTE — Evaluation (Signed)
Physical Therapy Evaluation Patient Details Name: Beth Padilla MRN: [id number] DOB: 12/05/1940 Today's Date: 09/03/2020   History of Present Illness  Beth Padilla is a 80 y.o. female with medical history significant of pA. 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  moderate aortic stenosis,  noted nodular liver/?? Cirrhosis on CT April 2021  Presenting with progressive shortness of breath, gaining 3-4 pounds in last few days.   Clinical Impression  Patient limited for functional mobility as stated below secondary to BLE weakness, fatigue and poor standing balance. Patient able to transition to EOB without assist. She demonstrates good sitting tolerance and sitting balance while seated EOB. Patient apprehensive today with upcoming procedure this morning and due to fear of falling. Patient requires frequent cueing for sequencing and use of RW to transfer to standing along with mod assist secondary to weakness. Patient limited to several slow, labored steps at bedside to chair. Patient will benefit from continued physical therapy in hospital and recommended venue below to increase strength, balance, endurance for safe ADLs and gait.     Follow Up Recommendations Home health PT;Supervision/Assistance - 24 hour;Supervision for mobility/OOB    Equipment Recommendations  None recommended by PT    Recommendations for Other Services       Precautions / Restrictions Precautions Precautions: Fall Restrictions Weight Bearing Restrictions: No      Mobility  Bed Mobility Overal bed mobility: Modified Independent             General bed mobility comments: slow, labored transition to seated EOB    Transfers Overall transfer level: Needs assistance Equipment used: Rolling walker (2 wheeled) Transfers: Sit to/from  [facility] Sit to Stand: Mod assist Stand pivot transfers: Mod assist       General transfer comment: transfer to standing with RW, cueing for RW use and sequencing  Ambulation/Gait Ambulation/Gait assistance: Min assist Gait Distance (Feet): 3 Feet Assistive device: Rolling walker (2 wheeled) Gait Pattern/deviations: Shuffle Gait velocity: decreased   General Gait Details: slow, labored shuffled cadence  Stairs            Wheelchair Mobility    Modified Rankin (Stroke Patients Only)       Balance Overall balance assessment: Needs assistance Sitting-balance support: Feet supported;No upper extremity supported Sitting balance-Leahy Scale: Normal Sitting balance - Comments: seated EOB   Standing balance support: Bilateral upper extremity supported Standing balance-Leahy Scale: Fair Standing balance comment: fair/poor with RW                             Pertinent Vitals/Pain Pain Assessment: No/denies pain    Home Living Family/patient expects to be discharged to:: Private residence Living Arrangements: Children Available Help at Discharge: Family;Available 24 hours/day Type of Home: House Home Access: Stairs to enter Entrance Stairs-Rails: Can reach both;Right;Left Entrance Stairs-Number of Steps: unable to state number of stairs Home Layout: One level Home Equipment: Walker - 2 wheels;Cane - single point;Shower seat;Wheelchair - manual      Prior Function Level of Independence: Independent with assistive device(s)         Comments: Patient states independent with basic ADL, household ambulator with SPC or RW     Hand Dominance        Extremity/Trunk Assessment   Upper Extremity Assessment Upper Extremity Assessment: Defer to  OT evaluation    Lower Extremity Assessment Lower Extremity Assessment: Generalized weakness    Cervical / Trunk Assessment Cervical / Trunk Assessment: Normal  Communication    Communication: No difficulties  Cognition Arousal/Alertness: Awake/alert Behavior During Therapy: WFL for tasks assessed/performed Overall Cognitive Status: Within Functional Limits for tasks assessed                                        General Comments      Exercises     Assessment/Plan    PT Assessment Patient needs continued PT services  PT Problem List Decreased strength;Decreased mobility;Decreased activity tolerance;Decreased balance       PT Treatment Interventions DME instruction;Therapeutic exercise;Gait training;Stair training;Neuromuscular re-education;Balance training;Functional mobility training;Therapeutic activities;Patient/family education    PT Goals (Current goals can be found in the Care Plan section)  Acute Rehab PT Goals Patient Stated Goal: return home PT Goal Formulation: With patient Time For Goal Achievement: 09/17/20 Potential to Achieve Goals: Good    Frequency Min 3X/week   Barriers to discharge        Co-evaluation               AM-PAC PT "6 Clicks" Mobility  Outcome Measure Help needed turning from your back to your side while in a flat bed without using bedrails?: None Help needed moving from lying on your back to sitting on the side of a flat bed without using bedrails?: None Help needed moving to and from a bed to a chair (including a wheelchair)?: A Lot Help needed standing up from a chair using your arms (e.g., wheelchair or bedside chair)?: A Lot Help needed to walk in hospital room?: A Lot Help needed climbing 3-5 steps with a railing? : A Lot 6 Click Score: 16    End of Session Equipment Utilized During Treatment: Gait belt Activity Tolerance: Patient tolerated treatment well Patient left: in chair;with call bell/phone within reach Nurse Communication: Mobility status PT Visit Diagnosis: Unsteadiness on feet (R26.81);Other abnormalities of gait and mobility (R26.89);Muscle weakness (generalized)  (M62.81)    Time: 4174-0814 PT Time Calculation (min) (ACUTE ONLY): 30 min   Charges:   PT Evaluation $PT Eval Low Complexity: 1 Low PT Treatments $Therapeutic Activity: 23-37 mins       9:31 AM, 09/03/20 Mearl Latin PT, DPT Physical Therapist at [facility]

## 2020-09-03 NOTE — Progress Notes (Signed)
We will proceed with EGD with capsule deployment as scheduled.  I thoroughly discussed with the patient his procedure, including the risks involved. Patient understands what the procedure involves including the benefits and any risks. Patient understands alternatives to the proposed procedure. Risks including (but not limited to) bleeding, tearing of the lining (perforation), rupture of adjacent organs, problems with heart and lung function, infection, and medication reactions. A small percentage of complications may require surgery, hospitalization, repeat endoscopic procedure, and/or transfusion.  Patient understood and agreed.  Maylon Peppers, MD Gastroenterology and Hepatology [facility]

## 2020-09-03 NOTE — Brief Op Note (Signed)
09/02/2020 - 09/03/2020  11:30 AM  PATIENT:  Beth Padilla  80 y.o. female  PRE-OPERATIVE DIAGNOSIS:  acute on chronic anemia (transfusion dependent), h/o melena. h/o gastric ulcer  POST-OPERATIVE DIAGNOSIS:  antrum congestion   PROCEDURE:  Procedure(s) with comments: ESOPHAGOGASTRODUODENOSCOPY (EGD) WITH PROPOFOL (N/A) GIVENS CAPSULE STUDY (N/A) - possible capsule deployment if egd unremarkable  SURGEON:  Surgeon(s) and Role: Panel 1:    * [name], MD - Primary  Patient underwent EGD under propofol sedation.  Tolerated the procedure adequately.  Esophagus was within normal limits.  Diffuse moderate inflammation characterized by congestion (edema) was found in the gastric antrum. No presence of ulcerations or hematin was observed in the rest of the gastric examination. The examined duodenum was normal.  Endoscopic capsule was successfully deployed in the duodenal bulb.  RECOMMENDATIONS: - Return patient to hospital ward for ongoing care.  - Resume previous diet.  - Continue PPI BID for now. - Trend H/H daily - Follow capsule endoscopy report.  Maylon Peppers, MD Gastroenterology and Hepatology [facility]

## 2020-09-03 NOTE — Progress Notes (Signed)
Patient in with symptomatic anemia.  However, BNP 1311 and while patient received 2 units of PRBCs, she also received 100 mg of furosemide.  Patient has NS ordered at 100 ml/hr.  Secure chat message sent to Dr. Clearence Ped.  Okay to hold fluids at this time.

## 2020-09-04 LAB — CBC
HCT: 28.3 % — ABNORMAL LOW (ref 36.0–46.0)
Hemoglobin: 8.7 g/dL — ABNORMAL LOW (ref 12.0–15.0)
MCH: 29.2 pg (ref 26.0–34.0)
MCHC: 30.7 g/dL (ref 30.0–36.0)
MCV: 95 fL (ref 80.0–100.0)
Platelets: 429 10*3/uL — ABNORMAL HIGH (ref 150–400)
RBC: 2.98 MIL/uL — ABNORMAL LOW (ref 3.87–5.11)
RDW: 18.2 % — ABNORMAL HIGH (ref 11.5–15.5)
WBC: 8.2 10*3/uL (ref 4.0–10.5)
nRBC: 0.4 % — ABNORMAL HIGH (ref 0.0–0.2)

## 2020-09-04 LAB — BASIC METABOLIC PANEL
Anion gap: 7 (ref 5–15)
BUN: 57 mg/dL — ABNORMAL HIGH (ref 8–23)
CO2: 26 mmol/L (ref 22–32)
Calcium: 8.1 mg/dL — ABNORMAL LOW (ref 8.9–10.3)
Chloride: 108 mmol/L (ref 98–111)
Creatinine, Ser: 1.98 mg/dL — ABNORMAL HIGH (ref 0.44–1.00)
GFR, Estimated: 25 mL/min — ABNORMAL LOW (ref >60)
Glucose, Bld: 124 mg/dL — ABNORMAL HIGH (ref 70–99)
Potassium: 5.2 mmol/L — ABNORMAL HIGH (ref 3.5–5.1)
Sodium: 141 mmol/L (ref 135–145)

## 2020-09-04 LAB — GLUCOSE, CAPILLARY
Glucose-Capillary: 115 mg/dL — ABNORMAL HIGH (ref 70–99)
Glucose-Capillary: 117 mg/dL — ABNORMAL HIGH (ref 70–99)
Glucose-Capillary: 163 mg/dL — ABNORMAL HIGH (ref 70–99)
Glucose-Capillary: 186 mg/dL — ABNORMAL HIGH (ref 70–99)

## 2020-09-04 NOTE — Procedures (Signed)
Small Bowel G[place] Capsule Study Procedure date:  09/03/2020  Referring Provider:  Skipper Cliche, MD PCP:  Dr. Kendrick Ranch, MD  Indication for procedure:  possible melena, iron deficiency anemia  Findings: Patient underwent endoscopic deployment of capsule endoscopy.  There was evidence of some mild trauma in the pharynx due to placement Initially.  The capsule reached the cecum.  Small bowel preparation was adequate.  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  No presence of any other alterations in the lining explain questionable melena and iron deficiency anemia.  - Start ferrous sulfate 325 mg BID - Outpatient colonoscopy -Can start Eliquis today and assess if there is any recurrent bleeding while hospitalized. -If recurrent bleeding, obtain CTA abdomen stat.  I personally communicated these recommendations to the patient  Maylon Peppers, MD Gastroenterology and Hepatology [facility]

## 2020-09-04 NOTE — Discharge Summary (Signed)
Physician Discharge Summary [facility]    Patient: Beth Padilla                   Admit date: 09/02/2020   DOB: [birth year]             Discharge date:09/04/2020/1:14 PM [id number]                          PCP: Kendrick Ranch, MD  Disposition: HOME with Home Health   Recommendations for Outpatient Follow-up:   Follow up: With gastroenterologist in 1 week, CBC and BMP within 1 week to follow-up with a gastroenterologist and PCP -Close monitoring of H&H recommended Status post EGD and capsule endoscopy, no active signs of bleeding Per GI okay to resume Eliquis  Discharge Condition: Stable   Code Status:   Code Status: Full Code  Diet recommendation: Regular healthy diet   Discharge Diagnoses:    Principal Problem:   Symptomatic anemia Active Problems:   Acute respiratory failure with hypoxia (HCC)   Severe anemia   Type 2 diabetes with nephropathy (HCC)   HTN (hypertension)   Hypothyroidism   Hyperlipidemia   Acute renal failure superimposed on stage 4 chronic kidney disease (HCC)   Stroke ([place])   Chronic diastolic HF (heart failure) ([place])   Depression   H/O: CVA (cerebrovascular accident)-- Has Afib   History of Present Illness/ Hospital Course Beth Padilla Summary:   CARNELLA FRYMAN is a 80 y.o. female with medical history significant of pA. 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  moderate aortic stenosis,  noted nodular liver/?? Cirrhosis on CT April 2021 Presenting with progressive shortness of breath, gaining 3-4 pounds in last few days.   Patient reports that her shortness of breath is persistently getting worse since she had COVID but has been getting worse in the past couple of days.  States she recently was at med and was told to stop her Lasix at that time.      Chronically anticoagulated on Eliquis due to her atrial fibrillation, reporting a dark stool again.  Patient had a GI work-up EGD July 2022 discovering gastric ulcers..  Supposed to follow-up with gastroenterology as an outpatient and was planned for repeat EGD with in 10 to 12 weeks.   09/02/2020 status post 2 units PRBC blood transfusion  09/03/2020 EGD        Acute on chronic anemia most likely due to ABLA--dark stools + heme positive/ h/o gastric ulcers -Monitor H&H, improved -Hemoglobin 6.8 >>> transfusing 2U PRBC 09/02/2020 >> 8.9, stable at 8.7 -09/03/2020 Status post EGD, and capsule endoscopy-GI reporting no active site of bleeding    -was Holding Eliquis... Will be resumed with close monitoring of H&H   -On her previous admission and July 2022... She received total of 4 units of PRBC was discharged with a hemoglobin of 9.1.   -Per GI recommendation continue PPI twice daily -Resuming Eliquis  09/04/2020        -Nonbleeding gastric ulcer and duodenitis ulcer-EGD 08/05/2020: (On last admission patient was treated for possible liver cirrhosis, likely treated for SBP)  -CT on this admission revealed nodular liver possible liver cirrhosis, LFTs within normal limits,, no signs of portal hypertension, -Ultrasound found no nodular hepatic -GI considering pancreatic CT with contrast in the future versus EUS (contrast not  indicated due to CKD stage IV)   Status post repeat EGD 09/03/2020/capsule endoscopy overnight -no active signs of bleeding, negative for any ulcers or duodenitis   Dyspnea  -Much improved -Likely CHF exacerbation, profound anemia -As needed supplemental oxygen, -Currently maintaining O2 sat 97% on room air -We will monitor closely -We will continue to treat underlying causes of anemia and CHF -Continue Lasix IV .Marland KitchenMarland Kitchen Switch to p.o. 40 mg daily   PAFIb--history of prior stroke in 2016- -Rate well controlled -On Eliquis, with holding due to profound anemia,  possible GI bleed -Very high risk for another stroke, stable - 1.2 % annual risk of stroke     CKD stage - IV --Creatinine is 2.0 which is close to patient's baseline BUN is 72, GFR is 25 -Creatinine 2.0, >>> 1.99 today -Renal function appears to be close to her baseline,   -We will avoid nephrotoxins,   DM II -last A1c 6.8 reflecting good diabetic control PTA -Continue strict diabetic diet, currently no medication unless   HFpEF--history of diastolic CHF -Elevated BNP 1368 -Monitoring I's and O's, daily weight -BNP 1368, -Apparently patient's Lasix was held, she was supposed to be on Diovan and acetazolamide It appears that she has not been taking them EF on echo from 07/22/2020 was 55 to 60% Recommending close follow-up with cardiologist regarding restarting above medications -Patient has received 80 mg of Lasix, with transfusion we will continue 20 mg of Lasix before in between after PRBC transfusions total of 60 mg  more, and 40 mg  IV daily -Switch IV Lasix to p.o. 40 twice daily   Anxiety and depression--- comfortable, stable we will, continue Lexapro and buspirone   Hypothyroidism--- stable continue levothyroxine 100 mcg daily      HTN -Elevated BP, restarting home medication -Home medications were amlodipine 5 mg daily, hydralazine 50 mg 3 times daily, isosorbide 30 mg  -Resuming isosorbide, added labetalol (anticipating discontinuing amlodipine and hydralazine) If BP remains stable   History of prior stroke- -- in the setting of A. fib, please see #2 above -Continue Lipitor,  -Eliquis on hold as above            Cultures:  -none  Antimicrobial: -none    Consults called:  GI --------------------------------------------------------------------------------------------------------------------------------------------  Code Status:   Code Status: Full Code      Disposition Plan: Dispo: The patient is from: Home              Anticipated d/c is to: Home with  home health     Discharge Instructions:     Allergies  Allergen Reactions   Ampicillin Rash     Procedures /Studies:   DG Chest 2 View  Result Date: 09/02/2020 CLINICAL DATA:  Shortness of breath. EXAM: CHEST - 2 VIEW COMPARISON:  Chest radiograph dated 07/24/2020. FINDINGS: Bilateral pulmonary opacities predominantly involving the mid to lower lobes most concerning for multilobar pneumonia. Overall there has been interval progression of the opacities compared to prior radiograph. Edema is not excluded. There is no pleural effusion pneumothorax. Able cardiac silhouette. Atherosclerotic calcification of the aorta. No acute osseous pathology. Degenerative changes of the shoulders and spine. IMPRESSION: Interval progression of bilateral pulmonary opacities most concerning for multilobar pneumonia. Electronically Signed   By: Anner Crete M.D.   On: 09/02/2020 03:04    Subjective:   Patient was seen and examined 09/04/2020, 1:14 PM Patient stable today. No acute distress.  No issues overnight Stable for discharge.  Discharge Exam:    Vitals:  09/03/20 2101 09/04/20 0550 09/04/20 1000 09/04/20 1035  BP: (!) 163/69 (!) 159/60    Pulse: 70 72    Resp: 20 18    Temp: 98.1 F (36.7 C) 98.3 F (36.8 C)    TempSrc: Oral Oral    SpO2: 100% 96% 97% 99%  Weight:  84.5 kg    Height:        General: Pt lying comfortably in bed & appears in no obvious distress. Cardiovascular: S1 & S2 heard, RRR, S1/S2 +. No murmurs, rubs, gallops or clicks. No JVD or pedal edema. Respiratory: Clear to auscultation without wheezing, rhonchi or crackles. No increased work of breathing. Abdominal:  Non-distended, non-tender & soft. No organomegaly or masses appreciated. Normal bowel sounds heard. CNS: Alert and oriented. No focal deficits. Extremities: no edema, no cyanosis      The results of significant diagnostics from this hospitalization (including imaging, microbiology, ancillary and  laboratory) are listed below for reference.      Microbiology:   Recent Results (from the past 240 hour(s))  Resp Panel by RT-PCR (Flu A&B, Covid) Nasopharyngeal Swab     Status: None   Collection Time: 09/02/20  6:28 AM   Specimen: Nasopharyngeal Swab; Nasopharyngeal(NP) swabs in vial transport medium  Result Value Ref Range Status   SARS Coronavirus 2 by RT PCR NEGATIVE NEGATIVE Final    Comment: (NOTE) SARS-CoV-2 target nucleic acids are NOT DETECTED.  The SARS-CoV-2 RNA is generally detectable in upper respiratory specimens during the acute phase of infection. The lowest concentration of SARS-CoV-2 viral copies this assay can detect is 138 copies/mL. A negative result does not preclude SARS-Cov-2 infection and should not be used as the sole basis for treatment or other patient management decisions. A negative result may occur with  improper specimen collection/handling, submission of specimen other than nasopharyngeal swab, presence of viral mutation(s) within the areas targeted by this assay, and inadequate number of viral copies(<138 copies/mL). A negative result must be combined with clinical observations, patient history, and epidemiological information. The expected result is Negative.  Fact Sheet for Patients:  EntrepreneurPulse.com.au  Fact Sheet for Healthcare Providers:  IncredibleEmployment.be  This test is no t yet approved or cleared by the [place] FDA and  has been authorized for detection and/or diagnosis of SARS-CoV-2 by FDA under an Emergency Use Authorization (EUA). This EUA will remain  in effect (meaning this test can be used) for the duration of the COVID-19 declaration under Section 564(b)(1) of the Act, 21 U.S.C.section 360bbb-3(b)(1), unless the authorization is terminated  or revoked sooner.       Influenza A by PCR NEGATIVE NEGATIVE Final   Influenza B by PCR NEGATIVE NEGATIVE Final    Comment:  (NOTE) The Xpert Xpress SARS-CoV-2/FLU/RSV plus assay is intended as an aid in the diagnosis of influenza from Nasopharyngeal swab specimens and should not be used as a sole basis for treatment. Nasal washings and aspirates are unacceptable for Xpert Xpress SARS-CoV-2/FLU/RSV testing.  Fact Sheet for Patients: EntrepreneurPulse.com.au  Fact Sheet for Healthcare Providers: IncredibleEmployment.be  This test is not yet approved or cleared by the [place] FDA and has been authorized for detection and/or diagnosis of SARS-CoV-2 by FDA under an Emergency Use Authorization (EUA). This EUA will remain in effect (meaning this test can be used) for the duration of the COVID-19 declaration under Section 564(b)(1) of the Act, 21 U.S.C. section 360bbb-3(b)(1), unless the authorization is terminated or revoked.  Performed at [facility], [address],  [place], [place] [address]      Labs:   CBC: Recent Labs  Lab 09/02/20 0433 09/03/20 0505 09/04/20 0658  WBC 10.4 7.5 8.2  NEUTROABS 9.2*  --   --   HGB 6.8* 8.9* 8.7*  HCT 22.2* 28.3* 28.3*  MCV 92.5 92.5 95.0  PLT 571* 446* 579*   Basic Metabolic Panel: Recent Labs  Lab 09/02/20 0433 09/03/20 0505 09/04/20 0658  NA 137 140 141  K 5.8* 4.9 5.2*  CL 106 107 108  CO2 24 27 26   GLUCOSE 267* 174* 124*  BUN 60* 59* 57*  CREATININE 2.00* 1.91* 1.98*  CALCIUM 8.0* 8.1* 8.1*   Liver Function Tests: Recent Labs  Lab 09/02/20 0433  AST 21  ALT 24  ALKPHOS 107  BILITOT 0.7  PROT 7.2  ALBUMIN 2.7*   BNP (last 3 results) Recent Labs    07/21/20 2258 09/02/20 0433 09/02/20 0636  BNP 1,321.0* 1,368.0* 1,311.0*   Cardiac Enzymes: No results for input(s): CKTOTAL, CKMB, CKMBINDEX, TROPONINI in the last 168 hours. CBG: Recent Labs  Lab 09/03/20 1210 09/03/20 1640 09/03/20 2102 09/04/20 0742 09/04/20 1114  GLUCAP 137* 182* 222* 115* 117*   Urinalysis    Component Value  Date/Time   COLORURINE STRAW (A) 02/13/2018 1705   APPEARANCEUR CLEAR 02/13/2018 1705   LABSPEC 1.006 02/13/2018 1705   PHURINE 7.0 02/13/2018 1705   GLUCOSEU NEGATIVE 02/13/2018 1705   HGBUR NEGATIVE 02/13/2018 1705   BILIRUBINUR NEGATIVE 02/13/2018 [place] 02/13/2018 1705   PROTEINUR 30 (A) 02/13/2018 1705   UROBILINOGEN 0.2 07/18/2014 2120   NITRITE NEGATIVE 02/13/2018 1705   LEUKOCYTESUR SMALL (A) 02/13/2018 1705         Time coordinating discharge: Over 45 minutes  SIGNED: Deatra James, MD, FACP, FHM. [facility],  Please use [email or web address] to Page If 7PM-7AM, please contact night-coverage [email or web address] 09/04/2020, 1:14 PM

## 2020-09-04 NOTE — Progress Notes (Signed)
Beth Padilla, M.D. Gastroenterology & Hepatology   Interval History:  No acute events overnight. Patient has felt well, denies any complaints.  Was able to tolerate diet.  States her last bowel movement was dark yesterday but no presence of melena or hematochezia. Her hemoglobin today has remained stable with most recent value of 8.7.  BUN unchanged compared to prior.  Inpatient Medications:  Current Facility-Administered Medications:    0.9 %  sodium chloride infusion, 250 mL, Intravenous, PRN, Shahmehdi, Seyed A, MD   acetaminophen (TYLENOL) tablet 650 mg, 650 mg, Oral, Q6H PRN **OR** acetaminophen (TYLENOL) suppository 650 mg, 650 mg, Rectal, Q6H PRN, Shahmehdi, Seyed A, MD   albuterol (PROVENTIL) (2.5 MG/3ML) 0.083% nebulizer solution 2.5 mg, 2.5 mg, Nebulization, Q2H PRN, Shahmehdi, Seyed A, MD, 2.5 mg at 09/04/20 1035   atorvastatin (LIPITOR) tablet 80 mg, 80 mg, Oral, QPM, Shahmehdi, Seyed A, MD, 80 mg at 09/03/20 1702   bisacodyl (DULCOLAX) EC tablet 5 mg, 5 mg, Oral, Daily PRN, Shahmehdi, Seyed A, MD   busPIRone (BUSPAR) tablet 10 mg, 10 mg, Oral, BID, Shahmehdi, Seyed A, MD, 10 mg at 09/04/20 4010   dicyclomine (BENTYL) capsule 10 mg, 10 mg, Oral, Q12H PRN, Shahmehdi, Seyed A, MD   escitalopram (LEXAPRO) tablet 10 mg, 10 mg, Oral, Daily, Shahmehdi, Seyed A, MD, 10 mg at 09/04/20 2725   ferrous sulfate tablet 325 mg, 325 mg, Oral, BID, Shahmehdi, Seyed A, MD, 325 mg at 09/04/20 3664   furosemide (LASIX) tablet 40 mg, 40 mg, Oral, Daily, Shahmehdi, Seyed A, MD, 40 mg at 09/04/20 0805   gabapentin (NEURONTIN) capsule 300 mg, 300 mg, Oral, Daily PRN, Shahmehdi, Seyed A, MD   hydrALAZINE (APRESOLINE) injection 10 mg, 10 mg, Intravenous, Q4H PRN, Shahmehdi, Seyed A, MD, 10 mg at 09/02/20 1458   HYDROmorphone (DILAUDID) injection 0.5-1 mg, 0.5-1 mg, Intravenous, Q2H PRN, Shahmehdi, Seyed A, MD   insulin aspart (novoLOG) injection 0-6 Units, 0-6 Units, Subcutaneous, TID WC, Shahmehdi,  Seyed A, MD, 1 Units at 09/03/20 1702   ipratropium (ATROVENT) nebulizer solution 0.5 mg, 0.5 mg, Nebulization, Q6H PRN, Shahmehdi, Seyed A, MD, 0.5 mg at 09/04/20 1035   iron sucrose (VENOFER) 300 mg in sodium chloride 0.9 % 250 mL IVPB, 300 mg, Intravenous, Q24H, Shahmehdi, Seyed A, MD, Last Rate: 176.7 mL/hr at 09/03/20 1511, 300 mg at 09/03/20 1511   isosorbide dinitrate (ISORDIL) tablet 30 mg, 30 mg, Oral, TID, Shahmehdi, Seyed A, MD, 30 mg at 09/04/20 0804   labetalol (NORMODYNE) tablet 200 mg, 200 mg, Oral, BID, Shahmehdi, Seyed A, MD, 200 mg at 09/04/20 0805   levalbuterol (XOPENEX) nebulizer solution 0.63 mg, 0.63 mg, Nebulization, Q6H PRN, Shahmehdi, Seyed A, MD   levothyroxine (SYNTHROID) tablet 125 mcg, 125 mcg, Oral, QAC breakfast, Shahmehdi, Seyed A, MD, 125 mcg at 09/04/20 0641   ondansetron (ZOFRAN) tablet 4 mg, 4 mg, Oral, Q6H PRN **OR** ondansetron (ZOFRAN) injection 4 mg, 4 mg, Intravenous, Q6H PRN, Shahmehdi, Seyed A, MD   oxyCODONE (Oxy IR/ROXICODONE) immediate release tablet 5 mg, 5 mg, Oral, Q4H PRN, Shahmehdi, Seyed A, MD, 5 mg at 09/03/20 2239   pantoprazole (PROTONIX) injection 40 mg, 40 mg, Intravenous, Q12H, Shahmehdi, Seyed A, MD, 40 mg at 09/04/20 0806   senna-docusate (Senokot-S) tablet 1 tablet, 1 tablet, Oral, QHS PRN, Shahmehdi, Seyed A, MD   sodium bicarbonate tablet 1,300 mg, 1,300 mg, Oral, BID, Shahmehdi, Seyed A, MD, 1,300 mg at 09/04/20 0805   sodium chloride flush (NS) 0.9 % injection 3  mL, 3 mL, Intravenous, Q12H, Shahmehdi, Seyed A, MD, 3 mL at 09/02/20 2329   sodium chloride flush (NS) 0.9 % injection 3 mL, 3 mL, Intravenous, Q12H, Shahmehdi, Seyed A, MD, 3 mL at 09/03/20 2242   sodium chloride flush (NS) 0.9 % injection 3 mL, 3 mL, Intravenous, PRN, Roger Shelter, Seyed A, MD, 3 mL at 09/04/20 0807   traZODone (DESYREL) tablet 25 mg, 25 mg, Oral, QHS PRN, Deatra James, MD   I/O    Intake/Output Summary (Last 24 hours) at 09/04/2020 1340 Last data  filed at 09/03/2020 1700 Gross per 24 hour  Intake 240 ml  Output 1 ml  Net 239 ml     Physical Exam: Temp:  [98.1 F (36.7 C)-98.3 F (36.8 C)] 98.3 F (36.8 C) (08/20 0550) Pulse Rate:  [70-72] 72 (08/20 0550) Resp:  [18-20] 18 (08/20 0550) BP: (159-163)/(60-69) 159/60 (08/20 0550) SpO2:  [96 %-100 %] 99 % (08/20 1035) Weight:  [84.5 kg] 84.5 kg (08/20 0550)  Temp (24hrs), Avg:98.2 F (36.8 C), Min:98.1 F (36.7 C), Max:98.3 F (36.8 C) GENERAL: The patient is AO x3, in no acute distress. Elder. HEENT: Head is normocephalic and atraumatic. EOMI are intact. Mouth is well hydrated and without lesions. NECK: Supple. No masses LUNGS: Clear to auscultation. No presence of rhonchi/wheezing/rales. Adequate chest expansion HEART: RRR, normal s1 and s2. ABDOMEN: Soft, nontender, no guarding, no peritoneal signs, and nondistended. BS +. No masses. EXTREMITIES: Without any cyanosis, clubbing, rash, lesions or edema. NEUROLOGIC: AOx3, no focal motor deficit. SKIN: no jaundice, no rashes  Laboratory Data: CBC:     Component Value Date/Time   WBC 8.2 09/04/2020 0658   RBC 2.98 (L) 09/04/2020 0658   HGB 8.7 (L) 09/04/2020 0658   HCT 28.3 (L) 09/04/2020 0658   PLT 429 (H) 09/04/2020 0658   MCV 95.0 09/04/2020 0658   MCH 29.2 09/04/2020 0658   MCHC 30.7 09/04/2020 0658   RDW 18.2 (H) 09/04/2020 0658   LYMPHSABS 0.7 09/02/2020 0433   MONOABS 0.4 09/02/2020 0433   EOSABS 0.0 09/02/2020 0433   BASOSABS 0.0 09/02/2020 0433   COAG:  Lab Results  Component Value Date   INR 1.4 (H) 09/03/2020   INR 1.5 (H) 09/02/2020   INR 1.4 (H) 08/03/2020    BMP:  BMP Latest Ref Rng & Units 09/04/2020 09/03/2020 09/02/2020  Glucose 70 - 99 mg/dL 124(H) 174(H) 267(H)  BUN 8 - 23 mg/dL 57(H) 59(H) 60(H)  Creatinine 0.44 - 1.00 mg/dL 1.98(H) 1.91(H) 2.00(H)  Sodium 135 - 145 mmol/L 141 140 137  Potassium 3.5 - 5.1 mmol/L 5.2(H) 4.9 5.8(H)  Chloride 98 - 111 mmol/L 108 107 106  CO2 22 - 32 mmol/L  _0 Calcium 8.9 - 10.3 mg/dL 8.1(L) 8.1(L) 8.0(L)    HEPATIC:  Hepatic Function Latest Ref Rng & Units 09/02/2020 08/04/2020 08/03/2020  Total Protein 6.5 - 8.1 g/dL 7.2 5.5(L) 6.5  Albumin 3.5 - 5.0 g/dL 2.7(L) 2.3(L) 2.6(L)  AST 15 - 41 U/L _1 ALT 0 - 44 U/L _2 Alk Phosphatase 38 - 126 U/L 107 38 44  Total Bilirubin 0.3 - 1.2 mg/dL 0.7 0.7 0.4  Bilirubin, Direct 0.0 - 0.2 mg/dL - - 0.1    CARDIAC:  Lab Results  Component Value Date   TROPONINI 0.13 ([place]) 02/14/2018      Imaging: I personally reviewed and interpreted the available labs, imaging and endoscopic files.   Assessment/Plan: Randie Heinz  is a 80 year old female with past medical history of CKD, A. fib on Eliquis, moderate aortic stenosis, History stroke, diabetes, hypertension, hyperlipidemia, hypothyroidism and heart failure, who was admitted to the hospital after presenting with symptomatic anemia and possible episode of melena.  The patient required transfusion of PRBC during her current hospitalization with adequate response.  Underwent EGD yesterday that showed presence of mild gastric congestion but no presence of any other abnormalities or ulcers.  Due to this, capsule endoscopy was performed but no presence of any active bleeding or stigmata of bleeding was found, was only presence of some lymphangiectasia's but no other lesions in the small bowel.  I had a discussion with the patient regarding next steps in treatment.  She will like to pursue a colonoscopy as outpatient.  We will set up this, she will need to take iron supplementation twice a day.  May need to monitor her for 1 day once she is restarted on her Eliquis.  - Ferrous sulfate 325 mg BID - Outpatient colonoscopy in next few weeks - Can start Eliquis today and assess if there is any recurrent bleeding while hospitalized. - If recurrent bleeding, obtain CTA abdomen stat.  GI service will sign-off, please call us back if you have any  more questions.  Beth Peppers, MD Gastroenterology and Hepatology [facility]

## 2020-09-05 LAB — BASIC METABOLIC PANEL
Anion gap: 7 (ref 5–15)
BUN: 50 mg/dL — ABNORMAL HIGH (ref 8–23)
CO2: 26 mmol/L (ref 22–32)
Calcium: 7.7 mg/dL — ABNORMAL LOW (ref 8.9–10.3)
Chloride: 108 mmol/L (ref 98–111)
Creatinine, Ser: 1.98 mg/dL — ABNORMAL HIGH (ref 0.44–1.00)
GFR, Estimated: 25 mL/min — ABNORMAL LOW (ref >60)
Glucose, Bld: 136 mg/dL — ABNORMAL HIGH (ref 70–99)
Potassium: 4.4 mmol/L (ref 3.5–5.1)
Sodium: 141 mmol/L (ref 135–145)

## 2020-09-05 LAB — GLUCOSE, CAPILLARY: Glucose-Capillary: 120 mg/dL — ABNORMAL HIGH (ref 70–99)

## 2020-09-05 LAB — CBC
HCT: 28.7 % — ABNORMAL LOW (ref 36.0–46.0)
Hemoglobin: 8.6 g/dL — ABNORMAL LOW (ref 12.0–15.0)
MCH: 28.5 pg (ref 26.0–34.0)
MCHC: 30 g/dL (ref 30.0–36.0)
MCV: 95 fL (ref 80.0–100.0)
Platelets: 405 10*3/uL — ABNORMAL HIGH (ref 150–400)
RBC: 3.02 MIL/uL — ABNORMAL LOW (ref 3.87–5.11)
RDW: 18.4 % — ABNORMAL HIGH (ref 11.5–15.5)
WBC: 8.2 10*3/uL (ref 4.0–10.5)
nRBC: 1.2 % — ABNORMAL HIGH (ref 0.0–0.2)

## 2020-09-05 MED ORDER — ALUM & MAG HYDROXIDE-SIMETH 200-200-20 MG/5ML PO SUSP: 30.0000 mL | ORAL | 0 refills | Status: DC | PRN

## 2020-09-05 MED ORDER — ALUM & MAG HYDROXIDE-SIMETH 200-200-20 MG/5ML PO SUSP
30.0000 mL | ORAL | Status: DC | PRN
  Administered 2020-09-05: 30 mL via ORAL
  Filled 2020-09-05: qty 30

## 2020-09-05 NOTE — Plan of Care (Signed)

## 2020-09-05 NOTE — Progress Notes (Signed)
Patient discharged home today, transported by daughter. Discharge packet given and went over with patient and daughter, both verbalized understanding. Belongings sent home with patient. Patient refused to allow staff to check blood glucose due to being discharged, patient stated she would eat and check it at home. Patient refused lunch tray. Made daughter aware. Patient stable upon discharge.

## 2020-09-05 NOTE — Discharge Summary (Signed)
Physician Discharge Summary [facility]    Patient: Beth Padilla                   Admit date: 09/02/2020   DOB: 10-Nov-1940             Discharge date:09/05/2020/7:49 AM POE:[id number]                          PCP: Kendrick Ranch, MD  Disposition: HOME with Home Health   Recommendations for Outpatient Follow-up:   Follow up: With gastroenterologist in 1 week, CBC and BMP within 1 week to follow-up with a gastroenterologist and PCP -Close monitoring of H&H recommended Status post EGD and capsule endoscopy, no active signs of bleeding Per GI okay to resume Eliquis  Discharge Condition: Stable   Code Status:   Code Status: Full Code  Diet recommendation: Regular healthy diet   Discharge Diagnoses:    Principal Problem:   Symptomatic anemia Active Problems:   Acute respiratory failure with hypoxia (HCC)   Severe anemia   Type 2 diabetes with nephropathy (HCC)   HTN (hypertension)   Hypothyroidism   Hyperlipidemia   Acute renal failure superimposed on stage 4 chronic kidney disease (HCC)   Stroke ([place])   Chronic diastolic HF (heart failure) ([place])   Depression   H/O: CVA (cerebrovascular accident)-- Has Afib   History of Present Illness/ Hospital Course Beth Padilla Summary:   REET SCHARRER is a 80 y.o. female with medical history significant of pA. 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  moderate aortic stenosis,  noted nodular liver/?? Cirrhosis on CT April 2021 Presenting with progressive shortness of breath, gaining 3-4 pounds in last few days.   Patient reports that her shortness of breath is persistently getting worse since she had COVID but has been getting worse in the past couple of days.  States she recently was at med and was told to stop her Lasix at that time.      Chronically anticoagulated on Eliquis due to her atrial fibrillation, reporting a dark stool again.  Patient had a GI work-up EGD July 2022 discovering gastric ulcers..  Supposed to follow-up with gastroenterology as an outpatient and was planned for repeat EGD with in 10 to 12 weeks.   09/02/2020 status post 2 units PRBC blood transfusion  09/03/2020 EGD    H&H remained stable, safe to be discharged   Acute on chronic anemia most likely due to ABLA--dark stools + heme positive/ h/o gastric ulcers -Monitor H&H, improved -Hemoglobin 6.8 >>> transfusing 2U PRBC 09/02/2020 >> 8.9, stable at 8.7, 8.6 -09/03/2020 Status post EGD, and capsule endoscopy-GI reporting no active site of bleeding    -was Holding Eliquis... Will be resumed with close monitoring of H&H as outpatient   -On her previous admission and July 2022... She received total of 4 units of PRBC was discharged with a hemoglobin of 9.1.   -Per GI recommendation continue PPI twice daily -Resuming Eliquis  09/04/2020        -Nonbleeding gastric ulcer and duodenitis ulcer-EGD 08/05/2020: (On last admission patient was treated for possible liver cirrhosis, likely treated for SBP)  -CT on this admission revealed nodular liver possible liver cirrhosis, LFTs within normal limits,, no signs of portal hypertension, -Ultrasound found no nodular hepatic -GI considering pancreatic CT with  contrast in the future versus EUS (contrast not indicated due to CKD stage IV)   Status post repeat EGD 09/03/2020/capsule endoscopy overnight -no active signs of bleeding, negative for any ulcers or duodenitis -H&H stable 9.  Dyspnea  -Improved, remains on room air -Likely CHF exacerbation, profound anemia -As needed supplemental oxygen, -Currently maintaining O2 sat 97% on room air -We will monitor closely -We will continue to treat underlying causes of anemia and CHF -Continue Lasix IV .Marland KitchenMarland Kitchen Switch to p.o. 40 mg daily   PAFIb--history of prior stroke  in 2016- -Rate well controlled -On Eliquis, with holding due to profound anemia, possible GI bleed -Very high risk for another stroke, stable      CKD stage - IV --Creatinine is 2.0 which is close to patient's baseline BUN is 72, GFR is 25 -Creatinine 2.0, >>> 1.99 -Renal function appears to be close to her baseline,   -We will avoid nephrotoxins,   DM II -last A1c 6.8 reflecting good diabetic control PTA -Continue strict diabetic diet, currently no medication unless   HFpEF--history of diastolic CHF -Elevated BNP 1368 -Monitoring I's and O's, daily weight -BNP 1368, -Apparently patient's Lasix was held, she was supposed to be on Diovan and acetazolamide It appears that she has not been taking them EF on echo from 07/22/2020 was 55 to 60% Recommending close follow-up with cardiologist regarding restarting above medications -Patient has received 80 mg of Lasix, with transfusion we will continue 20 mg of Lasix before in between after PRBC transfusions total of 60 mg  more, and 40 mg  IV daily -Switch IV Lasix to p.o. 40 twice daily   Anxiety and depression--- comfortable, stable we will, continue Lexapro and buspirone   Hypothyroidism--- stable continue levothyroxine 100 mcg daily      HTN -Elevated BP, restarting home medication -Home medications were amlodipine 5 mg daily, hydralazine 50 mg 3 times daily, isosorbide 30 mg  -Resuming isosorbide, added labetalol (anticipating discontinuing amlodipine and hydralazine) If BP remains stable   History of prior stroke- -- in the setting of A. fib, please see #2 above -Continue Lipitor,  -Eliquis -to be resumed now     Consults called:  GI --------------------------------------------------------------------------------------------------------------------------------------------  Code Status:   Code Status: Full Code      Disposition Plan: Dispo: The patient is from: Home              Anticipated d/c is to: Home with home  health     Discharge Instructions:   Discharge Instructions     (HEART FAILURE PATIENTS) Call MD:  Anytime you have any of the following symptoms: 1) 3 pound weight gain in 24 hours or 5 pounds in 1 week 2) shortness of breath, with or without a dry hacking cough 3) swelling in the hands, feet or stomach 4) if you have to sleep on extra pillows at night in order to breathe.   Complete by: As directed    Activity as tolerated - No restrictions   Complete by: As directed    Call MD for:  difficulty breathing, headache or visual disturbances   Complete by: As directed    Call MD for:  persistant dizziness or light-headedness   Complete by: As directed    Call MD for:  persistant nausea and vomiting   Complete by: As directed    Call MD for:  redness, tenderness, or signs of infection (pain, swelling, redness, odor or green/yellow discharge around incision site)   Complete by: As directed  Call MD for:  temperature >100.4   Complete by: As directed    Diet - low sodium heart healthy   Complete by: As directed    Discharge instructions   Complete by: As directed    Low hemoglobin, CBC within 1 week, For bloody stool.  Notified the primary care doctor if noticing any blood in stool Follow-up with a gastroenterologist in 2 days.   Increase activity slowly   Complete by: As directed        Allergies  Allergen Reactions   Ampicillin Rash     Procedures /Studies:   DG Chest 2 View  Result Date: 09/02/2020 CLINICAL DATA:  Shortness of breath. EXAM: CHEST - 2 VIEW COMPARISON:  Chest radiograph dated 07/24/2020. FINDINGS: Bilateral pulmonary opacities predominantly involving the mid to lower lobes most concerning for multilobar pneumonia. Overall there has been interval progression of the opacities compared to prior radiograph. Edema is not excluded. There is no pleural effusion pneumothorax. Able cardiac silhouette. Atherosclerotic calcification of the aorta. No acute osseous  pathology. Degenerative changes of the shoulders and spine. IMPRESSION: Interval progression of bilateral pulmonary opacities most concerning for multilobar pneumonia. Electronically Signed   By: Anner Crete M.D.   On: 09/02/2020 03:04    Subjective:   Patient was seen and examined 09/05/2020, 7:49 AM Patient stable today. No acute distress.  No issues overnight Stable for discharge.  Discharge Exam:    Vitals:   09/04/20 1526 09/04/20 2141 09/05/20 0430 09/05/20 0500  BP: (!) 146/69 (!) 160/61 (!) 165/66   Pulse: 79 74 77   Resp: 18 18 18    Temp: 98.2 F (36.8 C) 98.7 F (37.1 C) 98.7 F (37.1 C)   TempSrc: Oral Oral Oral   SpO2: 98% 96% 96%   Weight:    86.3 kg  Height:          Physical Exam:   General:  Alert, oriented, cooperative, no distress;   HEENT:  Normocephalic, PERRL, otherwise with in Normal limits   Neuro:  CNII-XII intact. , normal motor and sensation, reflexes intact   Lungs:   Clear to auscultation BL, Respirations unlabored, no wheezes / crackles  Cardio:    S1/S2, RRR, No murmure, No Rubs or Gallops   Abdomen:   Soft, non-tender, bowel sounds active all four quadrants,  no guarding or peritoneal signs.  Muscular skeletal:  Global generalized Limited exam - in bed, able to move all 4 extremities, Normal strength,  2+ pulses,  symmetric, No pitting edema  Skin:  Dry, warm to touch, negative for any Rashes,  Wounds: Please see nursing documentation          The results of significant diagnostics from this hospitalization (including imaging, microbiology, ancillary and laboratory) are listed below for reference.      Microbiology:   Recent Results (from the past 240 hour(s))  Resp Panel by RT-PCR (Flu A&B, Covid) Nasopharyngeal Swab     Status: None   Collection Time: 09/02/20  6:28 AM   Specimen: Nasopharyngeal Swab; Nasopharyngeal(NP) swabs in vial transport medium  Result Value Ref Range Status   SARS Coronavirus 2 by RT PCR NEGATIVE  NEGATIVE Final    Comment: (NOTE) SARS-CoV-2 target nucleic acids are NOT DETECTED.  The SARS-CoV-2 RNA is generally detectable in upper respiratory specimens during the acute phase of infection. The lowest concentration of SARS-CoV-2 viral copies this assay can detect is 138 copies/mL. A negative result does not preclude SARS-Cov-2 infection and should not be  used as the sole basis for treatment or other patient management decisions. A negative result may occur with  improper specimen collection/handling, submission of specimen other than nasopharyngeal swab, presence of viral mutation(s) within the areas targeted by this assay, and inadequate number of viral copies(<138 copies/mL). A negative result must be combined with clinical observations, patient history, and epidemiological information. The expected result is Negative.  Fact Sheet for Patients:  EntrepreneurPulse.com.au  Fact Sheet for Healthcare Providers:  IncredibleEmployment.be  This test is no t yet approved or cleared by the [place] FDA and  has been authorized for detection and/or diagnosis of SARS-CoV-2 by FDA under an Emergency Use Authorization (EUA). This EUA will remain  in effect (meaning this test can be used) for the duration of the COVID-19 declaration under Section 564(b)(1) of the Act, 21 U.S.C.section 360bbb-3(b)(1), unless the authorization is terminated  or revoked sooner.       Influenza A by PCR NEGATIVE NEGATIVE Final   Influenza B by PCR NEGATIVE NEGATIVE Final    Comment: (NOTE) The Xpert Xpress SARS-CoV-2/FLU/RSV plus assay is intended as an aid in the diagnosis of influenza from Nasopharyngeal swab specimens and should not be used as a sole basis for treatment. Nasal washings and aspirates are unacceptable for Xpert Xpress SARS-CoV-2/FLU/RSV testing.  Fact Sheet for Patients: EntrepreneurPulse.com.au  Fact Sheet for Healthcare  Providers: IncredibleEmployment.be  This test is not yet approved or cleared by the [place] FDA and has been authorized for detection and/or diagnosis of SARS-CoV-2 by FDA under an Emergency Use Authorization (EUA). This EUA will remain in effect (meaning this test can be used) for the duration of the COVID-19 declaration under Section 564(b)(1) of the Act, 21 U.S.C. section 360bbb-3(b)(1), unless the authorization is terminated or revoked.  Performed at [facility], [address], [place],  [address]      Labs:   CBC: Recent Labs  Lab 09/02/20 [id number] 09/03/20 0505 09/04/20 0658 09/05/20 0621  WBC 10.4 7.5 8.2 8.2  NEUTROABS 9.2*  --   --   --   HGB 6.8* 8.9* 8.7* 8.6*  HCT 22.2* 28.3* 28.3* 28.7*  MCV 92.5 92.5 95.0 95.0  PLT 571* 446* 429* 314*   Basic Metabolic Panel: Recent Labs  Lab 09/02/20 0433 09/03/20 0505 09/04/20 0658  NA 137 140 141  K 5.8* 4.9 5.2*  CL 106 107 108  CO2 24 27 26   GLUCOSE 267* 174* 124*  BUN 60* 59* 57*  CREATININE 2.00* 1.91* 1.98*  CALCIUM 8.0* 8.1* 8.1*   Liver Function Tests: Recent Labs  Lab 09/02/20 0433  AST 21  ALT 24  ALKPHOS 107  BILITOT 0.7  PROT 7.2  ALBUMIN 2.7*   BNP (last 3 results) Recent Labs    07/21/20 2258 09/02/20 0433 09/02/20 0636  BNP 1,321.0* 1,368.0* 1,311.0*   Cardiac Enzymes: No results for input(s): CKTOTAL, CKMB, CKMBINDEX, TROPONINI in the last 168 hours. CBG: Recent Labs  Lab 09/04/20 0742 09/04/20 1114 09/04/20 1624 09/04/20 2150 09/05/20 0747  GLUCAP 115* 117* 163* 186* 120*   Urinalysis    Component Value Date/Time   COLORURINE STRAW (A) 02/13/2018 1705   APPEARANCEUR CLEAR 02/13/2018 1705   LABSPEC 1.006 02/13/2018 1705   PHURINE 7.0 02/13/2018 1705   GLUCOSEU NEGATIVE 02/13/2018 1705   HGBUR NEGATIVE 02/13/2018 [place] 02/13/2018 [place] 02/13/2018 1705   PROTEINUR 30 (A) 02/13/2018 1705    UROBILINOGEN 0.2 07/18/2014 2120   NITRITE NEGATIVE  02/13/2018 1705   LEUKOCYTESUR SMALL (A) 02/13/2018 1705         Time coordinating discharge: Over 45 minutes  SIGNED: Deatra James, MD, FACP, [facility]. [facility],  Please use [email or web address] to Page If 7PM-7AM, please contact night-coverage [email or web address] 09/05/2020, 7:49 AM

## 2020-09-05 NOTE — TOC Transition Note (Signed)
Transition of Care [facility]) - CM/SW Discharge Note   Patient Details  Name: Beth Padilla MRN: [id number] Date of Birth: 10-13-1940  Transition of Care [facility]) CM/SW Contact:  Natasha Bence, LCSW Phone Number: 09/05/2020, 2:10 PM   Clinical Narrative:    CSW notified of patient's readiness for discharge. Patient's family agreeable to [facility] and expressed preference for [facility]. CSW faxed referral for [facility] to [facility]. TOC signing off.    Final next level of care: [place] Barriers to Discharge: Barriers Resolved   Patient Goals and CMS Choice Patient states their goals for this hospitalization and ongoing recovery are:: Return hone with [facility] CMS Medicare.gov Compare Post Acute Care list provided to:: Patient Choice offered to / list presented to : Patient  Discharge Placement                    Patient and family notified of of transfer: 09/05/20  Discharge Plan and Services                          HH Arranged: RN, PT [facility] Agency: [place] Date [place]: 09/05/20 Time [place]: 1410 Representative spoke with at [place]: Fax referral  Social Determinants of Health (SDOH) Interventions     Readmission Risk Interventions Readmission Risk Prevention Plan 08/04/2020 07/23/2020  Transportation Screening Complete Complete  HRI or [place] - Complete  Social Work Consult for [facility] Planning/Counseling - Complete  Palliative Care Screening - Not Applicable  Medication Review Press photographer) Complete Complete  HRI or Home Care Consult Complete -  SW Recovery Care/Counseling Consult Complete -  Palliative Care Screening Not Applicable -  [place] Not Applicable -  Some recent data might be hidden

## 2020-09-06 ENCOUNTER — Encounter (HOSPITAL_COMMUNITY): Payer: Self-pay | Admitting: Gastroenterology

## 2020-09-06 LAB — H. PYLORI ANTIBODY, IGG: H Pylori IgG: 0.28 Index Value (ref 0.00–0.79)

## 2020-09-07 ENCOUNTER — Telehealth (INDEPENDENT_AMBULATORY_CARE_PROVIDER_SITE_OTHER): Payer: Self-pay

## 2020-09-07 ENCOUNTER — Encounter (INDEPENDENT_AMBULATORY_CARE_PROVIDER_SITE_OTHER): Payer: Self-pay

## 2020-09-07 ENCOUNTER — Ambulatory Visit: Payer: Medicare Other | Admitting: Student

## 2020-09-07 ENCOUNTER — Other Ambulatory Visit (INDEPENDENT_AMBULATORY_CARE_PROVIDER_SITE_OTHER): Payer: Self-pay

## 2020-09-07 DIAGNOSIS — D638 Anemia in other chronic diseases classified elsewhere: Secondary | ICD-10-CM

## 2020-09-07 MED ORDER — CLENPIQ 10-3.5-12 MG-GM -GM/160ML PO SOLN: 1.0000 | Freq: Once | ORAL | 0 refills | Status: AC

## 2020-09-07 NOTE — Telephone Encounter (Signed)
Beth Padilla, CMA  

## 2020-09-08 ENCOUNTER — Encounter (INDEPENDENT_AMBULATORY_CARE_PROVIDER_SITE_OTHER): Payer: Self-pay

## 2020-09-10 NOTE — Patient Instructions (Signed)
SHEILIA REZNICK  09/10/2020     @PREFPERIOPPHARMACY @   Your procedure is scheduled on  09/21/2020.   Report to [facility] at  0900  A.M.   Call this number if you have problems the morning of surgery:  [phone]   Remember:  Follow the diet and prep instructions given to you by the office.  Your last dose of iron should be on 09/10/2020.  Your last dose of eliquis should be on 09/18/2020.     Take these medicines the morning of surgery with A SIP OF WATER        amlodipine, buspar, lexapro, isosorbide, levothyroxine, protonix.     Do not wear jewelry, make-up or nail polish.  Do not wear lotions, powders, or perfumes, or deodorant.  Do not shave 48 hours prior to surgery.  Men may shave face and neck.  Do not bring valuables to the hospital.  [facility] is not responsible for any belongings or valuables.  Contacts, dentures or bridgework may not be worn into surgery.  Leave your suitcase in the car.  After surgery it may be brought to your room.  For patients admitted to the hospital, discharge time will be determined by your treatment team.  Patients discharged the day of surgery will not be allowed to drive home and must have someone with them for 24 hours.    Special instructions:  DO NOT smoke tobacco or vape for 24 hours before your procedure.  Please read over the following fact sheets that you were given. Anesthesia Post-op Instructions and Care and Recovery After Surgery      Colonoscopy, Adult, Care After This sheet gives you information about how to care for yourself after your procedure. Your health care provider may also give you more specific instructions. If you have problems or questions, contact your health careprovider. What can I expect after the procedure? After the procedure, it is common to have: A small amount of blood in your stool for 24 hours after the procedure. Some gas. Mild cramping or bloating of your abdomen. Follow  these instructions at home: Eating and drinking  Drink enough fluid to keep your urine pale yellow. Follow instructions from your health care provider about eating or drinking restrictions. Resume your normal diet as instructed by your health care provider. Avoid heavy or fried foods that are hard to digest.  Activity Rest as told by your health care provider. Avoid sitting for a long time without moving. Get up to take short walks every 1-2 hours. This is important to improve blood flow and breathing. Ask for help if you feel weak or unsteady. Return to your normal activities as told by your health care provider. Ask your health care provider what activities are safe for you. Managing cramping and bloating  Try walking around when you have cramps or feel bloated. Apply heat to your abdomen as told by your health care provider. Use the heat source that your health care provider recommends, such as a moist heat pack or a heating pad. Place a towel between your skin and the heat source. Leave the heat on for 20-30 minutes. Remove the heat if your skin turns bright red. This is especially important if you are unable to feel pain, heat, or cold. You may have a greater risk of getting burned.  General instructions If you were given a sedative during the procedure, it can affect you for several hours. Do not drive or  operate machinery until your health care provider says that it is safe. For the first 24 hours after the procedure: Do not sign important documents. Do not drink alcohol. Do your regular daily activities at a slower pace than normal. Eat soft foods that are easy to digest. Take over-the-counter and prescription medicines only as told by your health care provider. Keep all follow-up visits as told by your health care provider. This is important. Contact a health care provider if: You have blood in your stool 2-3 days after the procedure. Get help right away if you have: More than  a small spotting of blood in your stool. Large blood clots in your stool. Swelling of your abdomen. Nausea or vomiting. A fever. Increasing pain in your abdomen that is not relieved with medicine. Summary After the procedure, it is common to have a small amount of blood in your stool. You may also have mild cramping and bloating of your abdomen. If you were given a sedative during the procedure, it can affect you for several hours. Do not drive or operate machinery until your health care provider says that it is safe. Get help right away if you have a lot of blood in your stool, nausea or vomiting, a fever, or increased pain in your abdomen. This information is not intended to replace advice given to you by your health care provider. Make sure you discuss any questions you have with your healthcare provider. Document Revised: 12/27/2018 Document Reviewed: 07/29/2018 Elsevier Patient Education  [place] After This sheet gives you information about how to care for yourself after your procedure. Your health care provider may also give you more specific instructions. If you have problems or questions, contact your health careprovider. What can I expect after the procedure? After the procedure, it is common to have: Tiredness. Forgetfulness about what happened after the procedure. Impaired judgment for important decisions. Nausea or vomiting. Some difficulty with balance. Follow these instructions at home: For the time period you were told by your health care provider:     Rest as needed. Do not participate in activities where you could fall or become injured. Do not drive or use machinery. Do not drink alcohol. Do not take sleeping pills or medicines that cause drowsiness. Do not make important decisions or sign legal documents. Do not take care of children on your own. Eating and drinking Follow the diet that is recommended by your health  care provider. Drink enough fluid to keep your urine pale yellow. If you vomit: Drink water, juice, or soup when you can drink without vomiting. Make sure you have little or no nausea before eating solid foods. General instructions Have a responsible adult stay with you for the time you are told. It is important to have someone help care for you until you are awake and alert. Take over-the-counter and prescription medicines only as told by your health care provider. If you have sleep apnea, surgery and certain medicines can increase your risk for breathing problems. Follow instructions from your health care provider about wearing your sleep device: Anytime you are sleeping, including during daytime naps. While taking prescription pain medicines, sleeping medicines, or medicines that make you drowsy. Avoid smoking. Keep all follow-up visits as told by your health care provider. This is important. Contact a health care provider if: You keep feeling nauseous or you keep vomiting. You feel light-headed. You are still sleepy or having trouble with balance after 24 hours. You  develop a rash. You have a fever. You have redness or swelling around the IV site. Get help right away if: You have trouble breathing. You have new-onset confusion at home. Summary For several hours after your procedure, you may feel tired. You may also be forgetful and have poor judgment. Have a responsible adult stay with you for the time you are told. It is important to have someone help care for you until you are awake and alert. Rest as told. Do not drive or operate machinery. Do not drink alcohol or take sleeping pills. Get help right away if you have trouble breathing, or if you suddenly become confused. This information is not intended to replace advice given to you by your health care provider. Make sure you discuss any questions you have with your healthcare provider. Document Revised: 09/18/2019 Document  Reviewed: 12/05/2018 Elsevier Patient Education  2022 [employer].

## 2020-09-16 ENCOUNTER — Encounter (HOSPITAL_COMMUNITY)
Admission: RE | Admit: 2020-09-16 | Discharge: 2020-09-16 | Disposition: A | Discharge status: Home / Self Care | Payer: Medicare Other | Source: Ambulatory Visit | Attending: Gastroenterology | Admitting: Gastroenterology

## 2020-09-16 ENCOUNTER — Encounter (HOSPITAL_COMMUNITY): Payer: Self-pay

## 2020-09-21 ENCOUNTER — Encounter (HOSPITAL_COMMUNITY): Admission: RE | Payer: Self-pay | Source: Home / Self Care

## 2020-09-21 ENCOUNTER — Ambulatory Visit (HOSPITAL_COMMUNITY): Admission: RE | Admit: 2020-09-21 | Payer: Medicare Other | Source: Home / Self Care | Admitting: Gastroenterology

## 2020-09-21 SURGERY — COLONOSCOPY WITH PROPOFOL
Anesthesia: Monitor Anesthesia Care

## 2020-10-14 ENCOUNTER — Other Ambulatory Visit (INDEPENDENT_AMBULATORY_CARE_PROVIDER_SITE_OTHER): Payer: Self-pay | Admitting: Gastroenterology

## 2020-10-14 ENCOUNTER — Encounter (HOSPITAL_COMMUNITY): Admission: RE | Admit: 2020-10-14 | Payer: Medicare Other | Source: Ambulatory Visit

## 2020-10-14 DIAGNOSIS — K922 Gastrointestinal hemorrhage, unspecified: Secondary | ICD-10-CM

## 2020-10-19 ENCOUNTER — Ambulatory Visit (HOSPITAL_COMMUNITY): Admit: 2020-10-19 | Payer: Medicare Other | Admitting: Gastroenterology

## 2020-10-19 ENCOUNTER — Encounter (HOSPITAL_COMMUNITY): Payer: Self-pay

## 2020-10-19 SURGERY — ESOPHAGOGASTRODUODENOSCOPY (EGD) WITH PROPOFOL
Anesthesia: Monitor Anesthesia Care

## 2020-12-24 ENCOUNTER — Other Ambulatory Visit: Payer: Self-pay

## 2020-12-24 ENCOUNTER — Encounter (HOSPITAL_COMMUNITY): Payer: Self-pay | Admitting: *Deleted

## 2020-12-24 DIAGNOSIS — F32A Depression, unspecified: Secondary | ICD-10-CM | POA: Diagnosis present

## 2020-12-24 DIAGNOSIS — Z8673 Personal history of transient ischemic attack (TIA), and cerebral infarction without residual deficits: Secondary | ICD-10-CM

## 2020-12-24 DIAGNOSIS — K648 Other hemorrhoids: Secondary | ICD-10-CM | POA: Diagnosis present

## 2020-12-24 DIAGNOSIS — Z6825 Body mass index (BMI) 25.0-25.9, adult: Secondary | ICD-10-CM

## 2020-12-24 DIAGNOSIS — E43 Unspecified severe protein-calorie malnutrition: Secondary | ICD-10-CM | POA: Diagnosis present

## 2020-12-24 DIAGNOSIS — Z823 Family history of stroke: Secondary | ICD-10-CM

## 2020-12-24 DIAGNOSIS — J189 Pneumonia, unspecified organism: Secondary | ICD-10-CM | POA: Diagnosis present

## 2020-12-24 DIAGNOSIS — Z79890 Hormone replacement therapy (postmenopausal): Secondary | ICD-10-CM

## 2020-12-24 DIAGNOSIS — E039 Hypothyroidism, unspecified: Secondary | ICD-10-CM | POA: Diagnosis present

## 2020-12-24 DIAGNOSIS — K921 Melena: Secondary | ICD-10-CM | POA: Diagnosis not present

## 2020-12-24 DIAGNOSIS — Z79899 Other long term (current) drug therapy: Secondary | ICD-10-CM

## 2020-12-24 DIAGNOSIS — E1121 Type 2 diabetes mellitus with diabetic nephropathy: Secondary | ICD-10-CM | POA: Diagnosis present

## 2020-12-24 DIAGNOSIS — E785 Hyperlipidemia, unspecified: Secondary | ICD-10-CM | POA: Diagnosis present

## 2020-12-24 DIAGNOSIS — K5730 Diverticulosis of large intestine without perforation or abscess without bleeding: Secondary | ICD-10-CM | POA: Diagnosis present

## 2020-12-24 DIAGNOSIS — Z66 Do not resuscitate: Secondary | ICD-10-CM | POA: Diagnosis not present

## 2020-12-24 DIAGNOSIS — E872 Acidosis, unspecified: Secondary | ICD-10-CM | POA: Diagnosis present

## 2020-12-24 DIAGNOSIS — C182 Malignant neoplasm of ascending colon: Principal | ICD-10-CM | POA: Diagnosis present

## 2020-12-24 DIAGNOSIS — I2510 Atherosclerotic heart disease of native coronary artery without angina pectoris: Secondary | ICD-10-CM | POA: Diagnosis present

## 2020-12-24 DIAGNOSIS — Z90710 Acquired absence of both cervix and uterus: Secondary | ICD-10-CM

## 2020-12-24 DIAGNOSIS — N179 Acute kidney failure, unspecified: Secondary | ICD-10-CM | POA: Diagnosis present

## 2020-12-24 DIAGNOSIS — Z20822 Contact with and (suspected) exposure to covid-19: Secondary | ICD-10-CM | POA: Diagnosis present

## 2020-12-24 DIAGNOSIS — I5031 Acute diastolic (congestive) heart failure: Secondary | ICD-10-CM | POA: Diagnosis present

## 2020-12-24 DIAGNOSIS — D62 Acute posthemorrhagic anemia: Secondary | ICD-10-CM | POA: Diagnosis present

## 2020-12-24 DIAGNOSIS — N184 Chronic kidney disease, stage 4 (severe): Secondary | ICD-10-CM | POA: Diagnosis present

## 2020-12-24 DIAGNOSIS — J9601 Acute respiratory failure with hypoxia: Secondary | ICD-10-CM | POA: Diagnosis present

## 2020-12-24 DIAGNOSIS — E1122 Type 2 diabetes mellitus with diabetic chronic kidney disease: Secondary | ICD-10-CM | POA: Diagnosis present

## 2020-12-24 DIAGNOSIS — Z7901 Long term (current) use of anticoagulants: Secondary | ICD-10-CM

## 2020-12-24 DIAGNOSIS — K219 Gastro-esophageal reflux disease without esophagitis: Secondary | ICD-10-CM | POA: Diagnosis present

## 2020-12-24 DIAGNOSIS — E875 Hyperkalemia: Secondary | ICD-10-CM | POA: Diagnosis present

## 2020-12-24 DIAGNOSIS — I130 Hypertensive heart and chronic kidney disease with heart failure and stage 1 through stage 4 chronic kidney disease, or unspecified chronic kidney disease: Secondary | ICD-10-CM | POA: Diagnosis present

## 2020-12-24 DIAGNOSIS — K769 Liver disease, unspecified: Secondary | ICD-10-CM | POA: Diagnosis present

## 2020-12-24 DIAGNOSIS — Z809 Family history of malignant neoplasm, unspecified: Secondary | ICD-10-CM

## 2020-12-24 DIAGNOSIS — I480 Paroxysmal atrial fibrillation: Secondary | ICD-10-CM | POA: Diagnosis present

## 2020-12-24 LAB — COMPREHENSIVE METABOLIC PANEL
ALT: 20 U/L (ref 0–44)
AST: 22 U/L (ref 15–41)
Albumin: 2.2 g/dL — ABNORMAL LOW (ref 3.5–5.0)
Alkaline Phosphatase: 68 U/L (ref 38–126)
Anion gap: 10 (ref 5–15)
BUN: 143 mg/dL — ABNORMAL HIGH (ref 8–23)
CO2: 19 mmol/L — ABNORMAL LOW (ref 22–32)
Calcium: 7.4 mg/dL — ABNORMAL LOW (ref 8.9–10.3)
Chloride: 107 mmol/L (ref 98–111)
Creatinine, Ser: 4.05 mg/dL — ABNORMAL HIGH (ref 0.44–1.00)
GFR, Estimated: 11 mL/min — ABNORMAL LOW (ref >60)
Glucose, Bld: 219 mg/dL — ABNORMAL HIGH (ref 70–99)
Potassium: 5.7 mmol/L — ABNORMAL HIGH (ref 3.5–5.1)
Sodium: 136 mmol/L (ref 135–145)
Total Bilirubin: 0.5 mg/dL (ref 0.3–1.2)
Total Protein: 6 g/dL — ABNORMAL LOW (ref 6.5–8.1)

## 2020-12-24 LAB — CBC
HCT: 14.7 % — ABNORMAL LOW (ref 36.0–46.0)
Hemoglobin: 4.4 g/dL — CL (ref 12.0–15.0)
MCH: 23.5 pg — ABNORMAL LOW (ref 26.0–34.0)
MCHC: 29.9 g/dL — ABNORMAL LOW (ref 30.0–36.0)
MCV: 78.6 fL — ABNORMAL LOW (ref 80.0–100.0)
Platelets: 225 10*3/uL (ref 150–400)
RBC: 1.87 MIL/uL — ABNORMAL LOW (ref 3.87–5.11)
RDW: 19.2 % — ABNORMAL HIGH (ref 11.5–15.5)
WBC: 5.8 10*3/uL (ref 4.0–10.5)
nRBC: 0.9 % — ABNORMAL HIGH (ref 0.0–0.2)

## 2020-12-24 NOTE — ED Triage Notes (Signed)
Pt having dark brown stools that started today; pt having increased swelling to bilateral hands and arms x 3 weeks

## 2020-12-25 ENCOUNTER — Emergency Department (HOSPITAL_COMMUNITY): Payer: Medicare Other

## 2020-12-25 ENCOUNTER — Inpatient Hospital Stay (HOSPITAL_COMMUNITY)
Admission: EM | Admit: 2020-12-25 | Discharge: 2020-12-30 | DRG: 374 | Disposition: A | Discharge status: Hospice | Payer: Medicare Other | Attending: Family Medicine | Admitting: Family Medicine

## 2020-12-25 ENCOUNTER — Other Ambulatory Visit (HOSPITAL_COMMUNITY): Payer: Medicare Other

## 2020-12-25 DIAGNOSIS — E785 Hyperlipidemia, unspecified: Secondary | ICD-10-CM | POA: Diagnosis present

## 2020-12-25 DIAGNOSIS — Z90710 Acquired absence of both cervix and uterus: Secondary | ICD-10-CM | POA: Diagnosis not present

## 2020-12-25 DIAGNOSIS — J9601 Acute respiratory failure with hypoxia: Secondary | ICD-10-CM | POA: Diagnosis present

## 2020-12-25 DIAGNOSIS — Z7901 Long term (current) use of anticoagulants: Secondary | ICD-10-CM | POA: Diagnosis not present

## 2020-12-25 DIAGNOSIS — K921 Melena: Secondary | ICD-10-CM | POA: Diagnosis present

## 2020-12-25 DIAGNOSIS — K219 Gastro-esophageal reflux disease without esophagitis: Secondary | ICD-10-CM | POA: Diagnosis present

## 2020-12-25 DIAGNOSIS — E875 Hyperkalemia: Secondary | ICD-10-CM | POA: Diagnosis present

## 2020-12-25 DIAGNOSIS — E872 Acidosis, unspecified: Secondary | ICD-10-CM | POA: Diagnosis present

## 2020-12-25 DIAGNOSIS — C182 Malignant neoplasm of ascending colon: Secondary | ICD-10-CM | POA: Diagnosis present

## 2020-12-25 DIAGNOSIS — I480 Paroxysmal atrial fibrillation: Secondary | ICD-10-CM | POA: Diagnosis present

## 2020-12-25 DIAGNOSIS — E43 Unspecified severe protein-calorie malnutrition: Secondary | ICD-10-CM

## 2020-12-25 DIAGNOSIS — Z66 Do not resuscitate: Secondary | ICD-10-CM | POA: Diagnosis not present

## 2020-12-25 DIAGNOSIS — C189 Malignant neoplasm of colon, unspecified: Secondary | ICD-10-CM | POA: Diagnosis not present

## 2020-12-25 DIAGNOSIS — D62 Acute posthemorrhagic anemia: Secondary | ICD-10-CM | POA: Diagnosis present

## 2020-12-25 DIAGNOSIS — K769 Liver disease, unspecified: Secondary | ICD-10-CM

## 2020-12-25 DIAGNOSIS — K922 Gastrointestinal hemorrhage, unspecified: Secondary | ICD-10-CM | POA: Diagnosis present

## 2020-12-25 DIAGNOSIS — I5032 Chronic diastolic (congestive) heart failure: Secondary | ICD-10-CM | POA: Diagnosis not present

## 2020-12-25 DIAGNOSIS — I130 Hypertensive heart and chronic kidney disease with heart failure and stage 1 through stage 4 chronic kidney disease, or unspecified chronic kidney disease: Secondary | ICD-10-CM | POA: Diagnosis present

## 2020-12-25 DIAGNOSIS — E039 Hypothyroidism, unspecified: Secondary | ICD-10-CM | POA: Diagnosis present

## 2020-12-25 DIAGNOSIS — F32A Depression, unspecified: Secondary | ICD-10-CM | POA: Diagnosis present

## 2020-12-25 DIAGNOSIS — N184 Chronic kidney disease, stage 4 (severe): Secondary | ICD-10-CM | POA: Diagnosis present

## 2020-12-25 DIAGNOSIS — J189 Pneumonia, unspecified organism: Secondary | ICD-10-CM | POA: Diagnosis present

## 2020-12-25 DIAGNOSIS — I10 Essential (primary) hypertension: Secondary | ICD-10-CM | POA: Diagnosis not present

## 2020-12-25 DIAGNOSIS — N179 Acute kidney failure, unspecified: Secondary | ICD-10-CM | POA: Diagnosis present

## 2020-12-25 DIAGNOSIS — J101 Influenza due to other identified influenza virus with other respiratory manifestations: Secondary | ICD-10-CM | POA: Clinically undetermined

## 2020-12-25 DIAGNOSIS — J181 Lobar pneumonia, unspecified organism: Secondary | ICD-10-CM

## 2020-12-25 DIAGNOSIS — Z20822 Contact with and (suspected) exposure to covid-19: Secondary | ICD-10-CM | POA: Diagnosis present

## 2020-12-25 DIAGNOSIS — D500 Iron deficiency anemia secondary to blood loss (chronic): Secondary | ICD-10-CM | POA: Diagnosis present

## 2020-12-25 DIAGNOSIS — K6389 Other specified diseases of intestine: Secondary | ICD-10-CM | POA: Diagnosis not present

## 2020-12-25 DIAGNOSIS — M7989 Other specified soft tissue disorders: Secondary | ICD-10-CM

## 2020-12-25 DIAGNOSIS — I5031 Acute diastolic (congestive) heart failure: Secondary | ICD-10-CM | POA: Diagnosis present

## 2020-12-25 DIAGNOSIS — E1121 Type 2 diabetes mellitus with diabetic nephropathy: Secondary | ICD-10-CM | POA: Diagnosis present

## 2020-12-25 DIAGNOSIS — E1122 Type 2 diabetes mellitus with diabetic chronic kidney disease: Secondary | ICD-10-CM | POA: Diagnosis present

## 2020-12-25 LAB — CBC WITH DIFFERENTIAL/PLATELET
Abs Immature Granulocytes: 0.02 10*3/uL (ref 0.00–0.07)
Basophils Absolute: 0 10*3/uL (ref 0.0–0.1)
Basophils Relative: 0 %
Eosinophils Absolute: 0 10*3/uL (ref 0.0–0.5)
Eosinophils Relative: 0 %
HCT: 22.9 % — ABNORMAL LOW (ref 36.0–46.0)
Hemoglobin: 7.4 g/dL — ABNORMAL LOW (ref 12.0–15.0)
Immature Granulocytes: 0 %
Lymphocytes Relative: 8 %
Lymphs Abs: 0.6 10*3/uL — ABNORMAL LOW (ref 0.7–4.0)
MCH: 26.9 pg (ref 26.0–34.0)
MCHC: 32.3 g/dL (ref 30.0–36.0)
MCV: 83.3 fL (ref 80.0–100.0)
Monocytes Absolute: 0.5 10*3/uL (ref 0.1–1.0)
Monocytes Relative: 6 %
Neutro Abs: 6.7 10*3/uL (ref 1.7–7.7)
Neutrophils Relative %: 86 %
Platelets: 180 10*3/uL (ref 150–400)
RBC: 2.75 MIL/uL — ABNORMAL LOW (ref 3.87–5.11)
RDW: 18.1 % — ABNORMAL HIGH (ref 11.5–15.5)
WBC: 7.9 10*3/uL (ref 4.0–10.5)
nRBC: 0.9 % — ABNORMAL HIGH (ref 0.0–0.2)

## 2020-12-25 LAB — BRAIN NATRIURETIC PEPTIDE: B Natriuretic Peptide: 695 pg/mL — ABNORMAL HIGH (ref 0.0–100.0)

## 2020-12-25 LAB — MAGNESIUM: Magnesium: 2 mg/dL (ref 1.7–2.4)

## 2020-12-25 LAB — RESP PANEL BY RT-PCR (FLU A&B, COVID) ARPGX2
Influenza A by PCR: NEGATIVE
Influenza B by PCR: NEGATIVE
SARS Coronavirus 2 by RT PCR: NEGATIVE

## 2020-12-25 LAB — COMPREHENSIVE METABOLIC PANEL
ALT: 17 U/L (ref 0–44)
AST: 22 U/L (ref 15–41)
Albumin: 2.1 g/dL — ABNORMAL LOW (ref 3.5–5.0)
Alkaline Phosphatase: 62 U/L (ref 38–126)
Anion gap: 9 (ref 5–15)
BUN: 146 mg/dL — ABNORMAL HIGH (ref 8–23)
CO2: 20 mmol/L — ABNORMAL LOW (ref 22–32)
Calcium: 7.4 mg/dL — ABNORMAL LOW (ref 8.9–10.3)
Chloride: 110 mmol/L (ref 98–111)
Creatinine, Ser: 3.89 mg/dL — ABNORMAL HIGH (ref 0.44–1.00)
GFR, Estimated: 11 mL/min — ABNORMAL LOW (ref >60)
Glucose, Bld: 174 mg/dL — ABNORMAL HIGH (ref 70–99)
Potassium: 5.4 mmol/L — ABNORMAL HIGH (ref 3.5–5.1)
Sodium: 139 mmol/L (ref 135–145)
Total Bilirubin: 0.9 mg/dL (ref 0.3–1.2)
Total Protein: 5.7 g/dL — ABNORMAL LOW (ref 6.5–8.1)

## 2020-12-25 LAB — HEMOGLOBIN AND HEMATOCRIT, BLOOD
HCT: 20.5 % — ABNORMAL LOW (ref 36.0–46.0)
Hemoglobin: 6.7 g/dL — CL (ref 12.0–15.0)

## 2020-12-25 LAB — TROPONIN I (HIGH SENSITIVITY)
Troponin I (High Sensitivity): 4 ng/L (ref <18)
Troponin I (High Sensitivity): 4 ng/L (ref <18)

## 2020-12-25 LAB — HEMOGLOBIN A1C
Hgb A1c MFr Bld: 6.9 % — ABNORMAL HIGH (ref 4.8–5.6)
Mean Plasma Glucose: 151.33 mg/dL

## 2020-12-25 LAB — POTASSIUM: Potassium: 5.6 mmol/L — ABNORMAL HIGH (ref 3.5–5.1)

## 2020-12-25 LAB — APTT: aPTT: 35 seconds (ref 24–36)

## 2020-12-25 LAB — PROTIME-INR
INR: 1.6 — ABNORMAL HIGH (ref 0.8–1.2)
Prothrombin Time: 18.6 seconds — ABNORMAL HIGH (ref 11.4–15.2)

## 2020-12-25 MED ORDER — FERROUS SULFATE 325 (65 FE) MG PO TABS
325.0000 mg | ORAL_TABLET | Freq: Two times a day (BID) | ORAL | Status: DC
  Administered 2020-12-25 – 2020-12-30 (×11): 325 mg via ORAL
  Filled 2020-12-25 (×10): qty 1

## 2020-12-25 MED ORDER — ONDANSETRON HCL 4 MG/2ML IJ SOLN: 4.0000 mg | Freq: Four times a day (QID) | INTRAMUSCULAR | Status: DC | PRN

## 2020-12-25 MED ORDER — ESCITALOPRAM OXALATE 10 MG PO TABS
10.0000 mg | ORAL_TABLET | Freq: Every day | ORAL | Status: DC
  Administered 2020-12-25 – 2020-12-30 (×6): 10 mg via ORAL
  Filled 2020-12-25 (×6): qty 1

## 2020-12-25 MED ORDER — PANTOPRAZOLE 80MG IVPB - SIMPLE MED
80.0000 mg | Freq: Once | INTRAVENOUS | Status: AC
  Administered 2020-12-25: 80 mg via INTRAVENOUS
  Filled 2020-12-25: qty 100

## 2020-12-25 MED ORDER — OXYCODONE HCL 5 MG PO TABS
5.0000 mg | ORAL_TABLET | ORAL | Status: DC | PRN
  Administered 2020-12-28 – 2020-12-30 (×3): 5 mg via ORAL
  Filled 2020-12-25 (×3): qty 1

## 2020-12-25 MED ORDER — LEVOTHYROXINE SODIUM 100 MCG PO TABS
125.0000 ug | ORAL_TABLET | Freq: Every day | ORAL | Status: DC
  Administered 2020-12-25 – 2020-12-30 (×5): 125 ug via ORAL
  Filled 2020-12-25 (×2): qty 1
  Filled 2020-12-25: qty 3
  Filled 2020-12-25 (×2): qty 1

## 2020-12-25 MED ORDER — SODIUM ZIRCONIUM CYCLOSILICATE 5 G PO PACK
10.0000 g | PACK | ORAL | Status: AC
  Administered 2020-12-25: 10 g via ORAL
  Filled 2020-12-25: qty 2

## 2020-12-25 MED ORDER — MORPHINE SULFATE (PF) 2 MG/ML IV SOLN
2.0000 mg | INTRAVENOUS | Status: DC | PRN
  Administered 2020-12-26: 2 mg via INTRAVENOUS
  Filled 2020-12-25: qty 1

## 2020-12-25 MED ORDER — ISOSORBIDE DINITRATE 20 MG PO TABS
30.0000 mg | ORAL_TABLET | Freq: Three times a day (TID) | ORAL | Status: DC
  Administered 2020-12-25 – 2020-12-30 (×16): 30 mg via ORAL
  Filled 2020-12-25: qty 1
  Filled 2020-12-25 (×3): qty 2
  Filled 2020-12-25 (×3): qty 1
  Filled 2020-12-25 (×3): qty 2
  Filled 2020-12-25: qty 1
  Filled 2020-12-25 (×4): qty 2
  Filled 2020-12-25: qty 1
  Filled 2020-12-25 (×2): qty 2
  Filled 2020-12-25: qty 1
  Filled 2020-12-25 (×3): qty 2

## 2020-12-25 MED ORDER — PANTOPRAZOLE INFUSION (NEW) - SIMPLE MED
8.0000 mg/h | INTRAVENOUS | Status: AC
  Administered 2020-12-25 – 2020-12-27 (×7): 8 mg/h via INTRAVENOUS
  Filled 2020-12-25: qty 100
  Filled 2020-12-25: qty 80
  Filled 2020-12-25 (×2): qty 100
  Filled 2020-12-25: qty 80
  Filled 2020-12-25: qty 100
  Filled 2020-12-25: qty 80

## 2020-12-25 MED ORDER — ATORVASTATIN CALCIUM 40 MG PO TABS
80.0000 mg | ORAL_TABLET | Freq: Every evening | ORAL | Status: DC
  Administered 2020-12-25 – 2020-12-29 (×5): 80 mg via ORAL
  Filled 2020-12-25 (×5): qty 2

## 2020-12-25 MED ORDER — INSULIN ASPART 100 UNIT/ML IJ SOLN
0.0000 [IU] | Freq: Three times a day (TID) | INTRAMUSCULAR | Status: DC
  Administered 2020-12-26: 2 [IU] via SUBCUTANEOUS
  Administered 2020-12-26 – 2020-12-28 (×4): 1 [IU] via SUBCUTANEOUS

## 2020-12-25 MED ORDER — ACETAMINOPHEN 650 MG RE SUPP: 650.0000 mg | Freq: Four times a day (QID) | RECTAL | Status: DC | PRN

## 2020-12-25 MED ORDER — PANTOPRAZOLE SODIUM 40 MG IV SOLR
40.0000 mg | Freq: Two times a day (BID) | INTRAVENOUS | Status: DC
  Administered 2020-12-28 – 2020-12-30 (×5): 40 mg via INTRAVENOUS
  Filled 2020-12-25 (×5): qty 40

## 2020-12-25 MED ORDER — ONDANSETRON HCL 4 MG PO TABS: 4.0000 mg | ORAL_TABLET | Freq: Four times a day (QID) | ORAL | Status: DC | PRN

## 2020-12-25 MED ORDER — ACETAMINOPHEN 325 MG PO TABS
650.0000 mg | ORAL_TABLET | Freq: Four times a day (QID) | ORAL | Status: DC | PRN
  Administered 2020-12-28: 650 mg via ORAL
  Filled 2020-12-25: qty 2

## 2020-12-25 MED ORDER — GABAPENTIN 300 MG PO CAPS: 300.0000 mg | ORAL_CAPSULE | Freq: Every day | ORAL | Status: DC | PRN

## 2020-12-25 MED ORDER — BUSPIRONE HCL 5 MG PO TABS
10.0000 mg | ORAL_TABLET | Freq: Two times a day (BID) | ORAL | Status: DC
  Administered 2020-12-25 – 2020-12-30 (×11): 10 mg via ORAL
  Filled 2020-12-25 (×11): qty 2

## 2020-12-25 MED ORDER — SODIUM BICARBONATE 650 MG PO TABS
1300.0000 mg | ORAL_TABLET | Freq: Two times a day (BID) | ORAL | Status: DC
  Administered 2020-12-25 – 2020-12-30 (×11): 1300 mg via ORAL
  Filled 2020-12-25 (×11): qty 2

## 2020-12-25 MED ORDER — SODIUM CHLORIDE 0.9% IV SOLUTION: Freq: Once | INTRAVENOUS | Status: AC

## 2020-12-25 MED ORDER — SODIUM CHLORIDE 0.9 % IV SOLN
10.0000 mL/h | Freq: Once | INTRAVENOUS | Status: AC
  Administered 2020-12-25: 10 mL/h via INTRAVENOUS

## 2020-12-25 NOTE — ED Notes (Signed)
This nurse called lab x2 to get update on morning labs. Lab stated that the morning labs were canceled and scheduled for 1200. Hospitalist aware.

## 2020-12-25 NOTE — Progress Notes (Signed)
Lab called critical lab hemoglobin 6.9 MD notified.

## 2020-12-25 NOTE — ED Notes (Signed)
Upon assessment protonix infusing at 82ml/hr. Protonix is ordered 29ml/hr. This nurse changed rate to 82ml/hr to match orders.

## 2020-12-25 NOTE — ED Notes (Signed)
Pt comfortable in bed. Denies pain or shortness of breath.

## 2020-12-25 NOTE — ED Notes (Signed)
This nurse called lab to see if morning labs had been drawn.

## 2020-12-25 NOTE — Progress Notes (Addendum)
Writer still awaiting response from MD in regards to hemoglobin 6.7.

## 2020-12-25 NOTE — H&P (Signed)
TRH H&P    Patient Demographics:    Beth Padilla, is a 80 y.o. female  MRN: [id number]  DOB - [birth year]  Admit Date - 12/25/2020  Referring MD/NP/PA: Betsey Holiday  Outpatient Primary MD for the patient is Vasireddy, Lanetta Inch, MD  Patient coming from: Home  Chief complaint- blood per rectum   HPI:    Beth Padilla  is a 80 y.o. female, with history of atrial fibrillation, CHF, coronary artery disease, diabetes mellitus type 2, GERD, hyperlipidemia, hypertension, hypothyroidism, and more presents the ED with a chief complaint of blood per rectum.  Patient is alert and oriented x3, but she is a poor historian when it comes to the details.  Patient reports that she had blood in her stool today.  She reports that she had not seen red blood in her stool prior to today.  She reports it was 1 time.  She does report melena but its been going on for an unknown period of time.  Patient reports that she is on iron pills that she can pain attention to the melena.  She denies stomach pain, nausea, vomiting, decrease in appetite, shortness of breath.  Patient does report an increase in peripheral edema.  She reports that that started today as well.  She has no chest pain.  Patient reports that she has had of dry occasional cough and congestion has been ongoing for an unknown period of time.  She thought that it was just a head cold.  She has not had a fever.  She is compliant with all of her medications.  Patient reports that she does feel fatigued and feels some generalized weakness.  She uses a wheelchair or cane to ambulate at home since her stroke that she had several years ago.  Patient has no pain at this time.  Patient denies any decrease in urine output or change in urine habits.  Patient has no other complaints at this time.  Patient does not smoke, does not drink alcohol.  She is vaccinated for COVID.  Patient is  full code.  In the ED Temp 97.1-97.6, heart rate 74-100, respiratory rate 13-19, blood pressure 130/67, satting at 96% No leukocytosis with a white blood cell count of 5.8, hemoglobin 4.4 Potassium slightly elevated at 5.7, decreased bicarb at 19, elevated creatinine at 4.05, elevated BUN at 143, elevated BNP at 695, normal troponin 4, repeat pending Negative COVID and flu Chest x-ray shows cardiomegaly with mild interstitial edema and small layering bilateral pleural effusions Nephro was consulted and advised to give 120 mg of IV Lasix if patient develops dyspnea with blood transfusions   Chart review Reveals them to series was done in August 2022.  They found gastritis on the scope.  They recommended continuing PPI twice daily.  There was normal duodenum exam, no specimens were taken.  This was apparently done for similar presentation. Patient is on Eliquis for atrial fibrillation.    Review of systems:    In addition to the HPI above,  No Fever-chills, No Headache, No changes with Vision  or hearing, No problems swallowing food or Liquids, No Chest pain,  Shortness of Breath, No Abdominal pain, No Nausea or Vomiting, bowel movements are regular, No dysuria, No new skin rashes or bruises, No new joints pains-aches,  No new asymmetric weakness, tingling, numbness in any extremity, No recent weight gain or loss, No polyuria, polydypsia or polyphagia, No significant Mental Stressors.  All other systems reviewed and are negative.    Past History of the following :    Past Medical History:  Diagnosis Date   Anemia    Atrial fibrillation ([place])    CHF (congestive heart failure) ([place])    Coronary artery disease    Depression    Diabetes mellitus    GERD (gastroesophageal reflux disease)    Hyperlipidemia 12/15/2010   Hypertension    Hypothyroidism 12/15/2010   Pneumonia    Renal disorder    Stroke [facility])       Past Surgical History:  Procedure Laterality Date    ABDOMINAL HYSTERECTOMY     BIOPSY  08/05/2020   Procedure: BIOPSY;  Surgeon: [name], DO;  [facility];  Service: Endoscopy;;   EP IMPLANTABLE DEVICE N/A 07/21/2014   Procedure: Loop Recorder Insertion;  Surgeon: [name], MD;  Location: [place] [facility] LAB;  Service: Cardiovascular;  Laterality: N/A;   ESOPHAGOGASTRODUODENOSCOPY (EGD) WITH PROPOFOL N/A 08/05/2020   Procedure: ESOPHAGOGASTRODUODENOSCOPY (EGD) WITH PROPOFOL;  Surgeon: [name], DO;  [facility];  Service: Endoscopy;  Laterality: N/A;   ESOPHAGOGASTRODUODENOSCOPY (EGD) WITH PROPOFOL N/A 09/03/2020   Procedure: ESOPHAGOGASTRODUODENOSCOPY (EGD) WITH PROPOFOL;  Surgeon: [name], MD;  [facility];  Service: Gastroenterology;  Laterality: N/A;   GIVENS CAPSULE STUDY N/A 09/03/2020   Procedure: GIVENS CAPSULE STUDY;  Surgeon: [name], MD;  [facility];  Service: Gastroenterology;  Laterality: N/A;  possible capsule deployment if egd unremarkable   implantable loop recorder removal  10/14/2018   MDT Reveal LINQ removed in office by Dr [name]   TEE WITHOUT CARDIOVERSION N/A 07/21/2014   Procedure: TRANSESOPHAGEAL ECHOCARDIOGRAM (TEE);  Surgeon: [name], MD;  Location: [facility] ENDOSCOPY;  Service: Cardiovascular;  Laterality: N/A;      Social History:      Social History   Tobacco Use   Smoking status: Never   Smokeless tobacco: Never  Substance Use Topics   Alcohol use: No       Family History :     Family History  Problem Relation Age of Onset   Stroke Mother    Cancer Sister       Home Medications:   Prior to Admission medications   Medication Sig Start Date End Date Taking? Authorizing Provider  acetaminophen (TYLENOL) 650 MG CR tablet Take 650 mg by mouth every 8 (eight) hours as needed for pain.    [provider]  alum & mag hydroxide-simeth (MAALOX/MYLANTA) 200-200-20 MG/5ML suspension Take 30 mLs by  mouth every 4 (four) hours as needed for indigestion. 09/05/20   Shahmehdi, Valeria Batman, MD  amLODipine (NORVASC) 5 MG tablet Take 5 mg by mouth daily. 08/02/20   [provider]  apixaban (ELIQUIS) 2.5 MG TABS tablet Take 2.5 mg by mouth 2 (two) times daily.    [provider]  atorvastatin (LIPITOR) 80 MG tablet Take 1 tablet (80 mg total) by mouth every evening. 07/22/14   [name], MD  busPIRone (BUSPAR) 10 MG tablet Take 10 mg by mouth 2 (two) times  daily. 07/14/20   [provider]  [employer] DICLOFENAC SODIUM 1 % GEL Apply 2 g topically 4 (four) times daily as needed (pain). 07/08/20   [provider]  escitalopram (LEXAPRO) 10 MG tablet Take 10 mg by mouth daily.    [provider]  ferrous sulfate 325 (65 FE) MG tablet Take 1 tablet by mouth 2 (two) times daily. 11/09/17   [provider]  furosemide (LASIX) 20 MG tablet Take 2 tablets (40 mg total) by mouth daily. 07/26/20   Barton Dubois, MD  gabapentin (NEURONTIN) 300 MG capsule Take 1 capsule by mouth daily as needed (nerve pain). 01/03/18   [provider]  isosorbide dinitrate (ISORDIL) 30 MG tablet Take 30 mg by mouth 3 (three) times daily.  09/18/15   [provider]  levothyroxine (SYNTHROID) 125 MCG tablet Take 125 mcg by mouth daily. 09/07/20   [provider]  pantoprazole (PROTONIX) 40 MG tablet TAKE 1 TABLET BY MOUTH TWICE A DAY 10/14/20   Montez Morita, Quillian Quince, MD  sodium bicarbonate 650 MG tablet Take 1,300 mg by mouth 2 (two) times daily.    [provider]  Vitamins A & D (VITAMIN A & D) ointment Apply 1 application topically as needed for dry skin. 11/09/17   [provider]     Allergies:     Allergies  Allergen Reactions   Ampicillin Rash     Physical Exam:   Vitals  Blood pressure 130/67, pulse 79, temperature (!) 97.1 F (36.2 C), resp. rate 18, height 5\' 7"  (1.702 m), weight 74.4 kg, SpO2 96 %.   1.   General: Patient lying supine in bed,  no acute distress   2. Psychiatric: Alert and oriented x 3, mood and behavior normal for situation, pleasant and cooperative with exam   3. Neurologic: Speech and language are normal, face is symmetric,  at baseline without acute deficits on limited exam   4. HEENMT:  Head is atraumatic, normocephalic, pupils reactive to light, conjunctival pallor, neck is supple, trachea is midline, mucous membranes are moist   5. Respiratory : Lungs are clear to auscultation bilaterally without wheezing, rhonchi, rales, no cyanosis, no increase in work of breathing or accessory muscle use   6. Cardiovascular : Heart rate normal, rhythm is irregular, no rubs or gallops, no peripheral edema, peripheral pulses palpated   7. Gastrointestinal:  Abdomen is soft, nondistended, nontender to palpation bowel sounds active, no masses or organomegaly palpated   8. Skin:  Skin is warm, dry and intact without rashes, acute lesions, or ulcers on limited exam   9.Musculoskeletal:  No acute deformities or trauma, no asymmetry in tone, no peripheral edema, peripheral pulses palpated, no tenderness to palpation in the extremities     Data Review:    CBC Recent Labs  Lab 12/24/20 2302  WBC 5.8  HGB 4.4*  HCT 14.7*  PLT 225  MCV 78.6*  MCH 23.5*  MCHC 29.9*  RDW 19.2*   ------------------------------------------------------------------------------------------------------------------  Results for orders placed or performed during the hospital encounter of 12/25/20 (from the past 48 hour(s))  Comprehensive metabolic panel     Status: Abnormal   Collection Time: 12/24/20 11:02 PM  Result Value Ref Range   Sodium 136 135 - 145 mmol/L   Potassium 5.7 (H) 3.5 - 5.1 mmol/L   Chloride 107 98 - 111 mmol/L   CO2 19 (L) 22 - 32 mmol/L   Glucose, Bld 219 (H) 70 - 99 mg/dL  Comment: Glucose reference range applies only to samples taken after fasting for at least 8  hours.   BUN 143 (H) 8 - 23 mg/dL    Comment: RESULTS CONFIRMED BY MANUAL DILUTION   Creatinine, Ser 4.05 (H) 0.44 - 1.00 mg/dL   Calcium 7.4 (L) 8.9 - 10.3 mg/dL   Total Protein 6.0 (L) 6.5 - 8.1 g/dL   Albumin 2.2 (L) 3.5 - 5.0 g/dL   AST 22 15 - 41 U/L   ALT 20 0 - 44 U/L   Alkaline Phosphatase 68 38 - 126 U/L   Total Bilirubin 0.5 0.3 - 1.2 mg/dL   GFR, Estimated 11 (L) >60 mL/min    Comment: (NOTE) Calculated using the CKD-EPI Creatinine Equation (2021)    Anion gap 10 5 - 15    Comment: Performed at [facility], [address], [place], [place] [address]  CBC     Status: Abnormal   Collection Time: 12/24/20 11:02 PM  Result Value Ref Range   WBC 5.8 4.0 - 10.5 K/uL   RBC 1.87 (L) 3.87 - 5.11 MIL/uL   Hemoglobin 4.4 (LL) 12.0 - 15.0 g/dL    Comment: CRITICAL VALUE NOTED.  VALUE IS CONSISTENT WITH PREVIOUSLY REPORTED AND CALLED VALUE. REPEATED TO VERIFY THIS CRITICAL RESULT HAS VERIFIED AND BEEN CALLED TO BRAME,M BY JAMIE WOODIE ON 12 09 2022 AT 2327, AND HAS BEEN READ BACK.     HCT 14.7 (L) 36.0 - 46.0 %   MCV 78.6 (L) 80.0 - 100.0 fL   MCH 23.5 (L) 26.0 - 34.0 pg   MCHC 29.9 (L) 30.0 - 36.0 g/dL   RDW 19.2 (H) 11.5 - 15.5 %   Platelets 225 150 - 400 K/uL   nRBC 0.9 (H) 0.0 - 0.2 %    Comment: Performed at [facility], [address], [place], [place] [address]  Type and screen [facility]     Status: None (Preliminary result)   Collection Time: 12/24/20 11:02 PM  Result Value Ref Range   ABO/RH(D) A POS    Antibody Screen NEG    Sample Expiration 12/27/2020,2359    Unit Number [id number]    Blood Component Type RBC LR PHER1    Unit division 00    Status of Unit ISSUED    Transfusion Status OK TO TRANSFUSE    Crossmatch Result      Compatible Performed at [facility], [address], [place], [place] [address]    Unit Number [id number]    Blood Component Type RED CELLS,LR    Unit division 00    Status of Unit ALLOCATED    Transfusion Status OK  TO TRANSFUSE    Crossmatch Result Compatible    Unit Number [id number]    Blood Component Type RED CELLS,LR    Unit division 00    Status of Unit ALLOCATED    Transfusion Status OK TO TRANSFUSE    Crossmatch Result Compatible   Troponin I (High Sensitivity)     Status: None   Collection Time: 12/24/20 11:02 PM  Result Value Ref Range   Troponin I (High Sensitivity) 4 <18 ng/L    Comment: (NOTE) Elevated high sensitivity troponin I (hsTnI) values and significant  changes across serial measurements may suggest ACS but many other  chronic and acute conditions are known to elevate hsTnI results.  Refer to the "Links" section for chest pain algorithms and additional  guidance. Performed at [facility], [address] [place], [place] [address]  Brain natriuretic peptide     Status: Abnormal   Collection Time: 12/24/20 11:02 PM  Result Value Ref Range   B Natriuretic Peptide 695.0 (H) 0.0 - 100.0 pg/mL    Comment: Performed at [facility], [address] [place], [place] [address]  Prepare RBC (crossmatch)     Status: None   Collection Time: 12/24/20 11:02 PM  Result Value Ref Range   Order Confirmation      ORDER PROCESSED BY BLOOD BANK Performed at [facility], [address] [place], [place] [address]   Resp Panel by RT-PCR (Flu A&B, Covid) Nasopharyngeal Swab     Status: None   Collection Time: 12/25/20 12:10 AM   Specimen: Nasopharyngeal Swab; Nasopharyngeal(NP) swabs in vial transport medium  Result Value Ref Range   SARS Coronavirus 2 by RT PCR NEGATIVE NEGATIVE    Comment: (NOTE) SARS-CoV-2 target nucleic acids are NOT DETECTED.  The SARS-CoV-2 RNA is generally detectable in upper respiratory specimens during the acute phase of infection. The lowest concentration of SARS-CoV-2 viral copies this assay can detect is 138 copies/mL. A negative result does not preclude SARS-Cov-2 infection and should not be used as the sole basis for treatment or other patient  management decisions. A negative result may occur with  improper specimen collection/handling, submission of specimen other than nasopharyngeal swab, presence of viral mutation(s) within the areas targeted by this assay, and inadequate number of viral copies(<138 copies/mL). A negative result must be combined with clinical observations, patient history, and epidemiological information. The expected result is Negative.  Fact Sheet for Patients:  EntrepreneurPulse.com.au  Fact Sheet for Healthcare Providers:  IncredibleEmployment.be  This test is no t yet approved or cleared by the [place] FDA and  has been authorized for detection and/or diagnosis of SARS-CoV-2 by FDA under an Emergency Use Authorization (EUA). This EUA will remain  in effect (meaning this test can be used) for the duration of the COVID-19 declaration under Section 564(b)(1) of the Act, 21 U.S.C.section 360bbb-3(b)(1), unless the authorization is terminated  or revoked sooner.       Influenza A by PCR NEGATIVE NEGATIVE   Influenza B by PCR NEGATIVE NEGATIVE    Comment: (NOTE) The Xpert Xpress SARS-CoV-2/FLU/RSV plus assay is intended as an aid in the diagnosis of influenza from Nasopharyngeal swab specimens and should not be used as a sole basis for treatment. Nasal washings and aspirates are unacceptable for Xpert Xpress SARS-CoV-2/FLU/RSV testing.  Fact Sheet for Patients: EntrepreneurPulse.com.au  Fact Sheet for Healthcare Providers: IncredibleEmployment.be  This test is not yet approved or cleared by the [place] FDA and has been authorized for detection and/or diagnosis of SARS-CoV-2 by FDA under an Emergency Use Authorization (EUA). This EUA will remain in effect (meaning this test can be used) for the duration of the COVID-19 declaration under Section 564(b)(1) of the Act, 21 U.S.C. section 360bbb-3(b)(1), unless the  authorization is terminated or revoked.  Performed at [facility], [address], [place], [place] [address]     Chemistries  Recent Labs  Lab 12/24/20 2302  NA 136  K 5.7*  CL 107  CO2 19*  GLUCOSE 219*  BUN 143*  CREATININE 4.05*  CALCIUM 7.4*  AST 22  ALT 20  ALKPHOS 68  BILITOT 0.5   ------------------------------------------------------------------------------------------------------------------  ------------------------------------------------------------------------------------------------------------------ GFR: Estimated Creatinine Clearance: 11.5 mL/min (A) (by C-G formula based on SCr of 4.05 mg/dL (H)). Liver Function Tests: Recent Labs  Lab 12/24/20 2302  AST 22  ALT 20  ALKPHOS 68  BILITOT 0.5  PROT 6.0*  ALBUMIN 2.2*   No results for input(s): LIPASE, AMYLASE in the last 168 hours. No results for input(s): AMMONIA in the last 168 hours. Coagulation Profile: No results for input(s): INR, PROTIME in the last 168 hours. Cardiac Enzymes: No results for input(s): CKTOTAL, CKMB, CKMBINDEX, TROPONINI in the last 168 hours. BNP (last 3 results) No results for input(s): PROBNP in the last 8760 hours. HbA1C: No results for input(s): HGBA1C in the last 72 hours. CBG: No results for input(s): GLUCAP in the last 168 hours. Lipid Profile: No results for input(s): CHOL, HDL, LDLCALC, TRIG, CHOLHDL, LDLDIRECT in the last 72 hours. Thyroid Function Tests: No results for input(s): TSH, T4TOTAL, FREET4, T3FREE, THYROIDAB in the last 72 hours. Anemia Panel: No results for input(s): VITAMINB12, FOLATE, FERRITIN, TIBC, IRON, RETICCTPCT in the last 72 hours.  --------------------------------------------------------------------------------------------------------------- Urine analysis:    Component Value Date/Time   COLORURINE STRAW (A) 02/13/2018 1705   APPEARANCEUR CLEAR 02/13/2018 1705   LABSPEC 1.006 02/13/2018 1705   PHURINE 7.0 02/13/2018 1705    GLUCOSEU NEGATIVE 02/13/2018 1705   HGBUR NEGATIVE 02/13/2018 1705   BILIRUBINUR NEGATIVE 02/13/2018 [place] 02/13/2018 1705   PROTEINUR 30 (A) 02/13/2018 1705   UROBILINOGEN 0.2 07/18/2014 2120   NITRITE NEGATIVE 02/13/2018 1705   LEUKOCYTESUR SMALL (A) 02/13/2018 1705      Imaging Results:    DG Chest Port 1 View  Result Date: 12/25/2020 CLINICAL DATA:  Edema EXAM: PORTABLE CHEST 1 VIEW COMPARISON:  09/02/2020 FINDINGS: Cardiomegaly with mild interstitial edema. Layering small bilateral pleural effusions, right greater than left, improved. No pneumothorax. Thoracic aortic atherosclerosis. IMPRESSION: Cardiomegaly with mild interstitial edema and small layering bilateral pleural effusions, right greater than left. Electronically Signed   By: Julian Hy M.D.   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) (HCC)   GI bleed Melena for an unknown amount of time, red blood per rectum x1 Hemoglobin 4.4, BUN 143 Transfused 3 units Trend CBC Consult GI -notified by ED provider Continue Protonix -history of gastritis Continue to monitor AKI Creatinine 4.05 double her baseline Bicarb 19 Potassium 5.7 Continue sodium bicarb, Lokelma given in the ED With fluid overload clinically-cardiorenal syndrome is on the differential however Lasix not being given right now because of the severe anemia and risk of hypotension Avoid nephrotoxic agents when possible Continue to monitor Blood loss anemia Secondary to #1 Hemoglobin 4.4 See plan for #1 Hyperkalemia Secondary to #2 See plan for  #2 Hypocalcemia Corrects to 8.84 albumin 2.2 Continue to monitor Hypothyroidism Continue Synthroid Mood disorder Continue BuSpar and Lexapro CHF Currently in mild exacerbation Cardiomegaly, pleural effusions, interstitial edema on chest x-ray BNP 695 Update echo Continue Isodril Daily weights Strict intake and output Continue to monitor Severe protein calorie malnutrition Patient currently n.p.o. secondary to GI bleed When patient is able to tolerate p.o. encourage nutrient dense food options and add protein shakes Atrial fibrillation Holding anticoagulation in the setting of severe anemia secondary to GI bleed   DVT Prophylaxis-   SCDs  AM Labs Ordered, also please review Full Orders  Family Communication: No family at bedside Code Status: Full  Admission status: Inpatient :The appropriate admission status for this patient is INPATIENT. Inpatient status is judged to be reasonable and necessary in order to provide the required intensity of service to ensure the patient's safety. The patient's presenting symptoms, physical exam findings, and initial radiographic and laboratory data in the context of their chronic comorbidities is felt to place them at high risk for further clinical deterioration. Furthermore, it is not anticipated that the patient will be medically stable for discharge from the hospital within 2 midnights of admission. The following factors support the admission status of inpatient.     The patient's presenting symptoms include blood per rectum. The worrisome physical exam findings include conjunctival pallor, atrial fibrillation,. The initial radiographic and laboratory data are worrisome because of AKI, severe anemia, elevated BUN. The chronic co-morbidities include hypertension, history of stroke, CHF, mood disorder, hypothyroidism.       * I certify that at the point of admission it is my clinical judgment that the patient will require inpatient  hospital care spanning beyond 2 midnights from the point of admission due to high intensity of service, high risk for further deterioration and high frequency of surveillance required.*  Disposition: Anticipated Discharge date 3-4 days Discharge to home  Time spent in minutes : [place] DO

## 2020-12-25 NOTE — ED Provider Notes (Signed)
[facility] EMERGENCY DEPARTMENT Provider Note   CSN: [id number] Arrival date & time: 12/24/20  2015     History Chief Complaint  Patient presents with   Rectal Bleeding    Beth Padilla is a 80 y.o. female.  Patient presents to the emergency department for evaluation of multiple problems.  Patient has been experiencing increased swelling of her extremities for the last several weeks.  Swelling has been waxing and waning, now involves all 4 extremities.  Patient has been taking her Lasix twice a day as prescribed, has not increased her dose.  She has noticed, however, decreased urine output.  She has been experiencing increased weakness.  Today family noticed dark red blood and blood clots in the commode after a bowel movement.  Patient does take Eliquis secondary to atrial fibrillation.      Past Medical History:  Diagnosis Date   Anemia    Atrial fibrillation (HCC)    CHF (congestive heart failure) ([place])    Coronary artery disease    Depression    Diabetes mellitus    GERD (gastroesophageal reflux disease)    Hyperlipidemia 12/15/2010   Hypertension    Hypothyroidism 12/15/2010   Pneumonia    Renal disorder    Stroke [facility])     Patient Active Problem List   Diagnosis Date Noted   Symptomatic anemia 09/02/2020   Severe anemia 09/02/2020   Epigastric discomfort 08/18/2020   Pancreatic abnormality 08/18/2020   Constipation 08/18/2020   Acute GI bleeding 08/03/2020   H/O: CVA (cerebrovascular accident)-- Has Afib 08/03/2020   CKD (chronic kidney disease), 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   HTN (hypertension) 12/15/2010   Hypothyroidism 12/15/2010   Hyperlipidemia 12/15/2010    Past Surgical History:  Procedure Laterality Date   ABDOMINAL HYSTERECTOMY     BIOPSY  08/05/2020   Procedure: BIOPSY;  Surgeon: [name], DO;  Location: [facility] ENDO [facility];  Service: Endoscopy;;   EP IMPLANTABLE DEVICE N/A 07/21/2014   Procedure: Loop Recorder Insertion;  Surgeon: [name], MD;  Location: [place] CV LAB;  Service: Cardiovascular;  Laterality: N/A;   ESOPHAGOGASTRODUODENOSCOPY (EGD) WITH PROPOFOL N/A 08/05/2020   Procedure: ESOPHAGOGASTRODUODENOSCOPY (EGD) WITH PROPOFOL;  Surgeon: [name], DO;  Location: [facility] ENDO SU[facility];  Service: Endoscopy;  Laterality: N/A;   ESOPHAGOGASTRODUODENOSCOPY (EGD) WITH PROPOFOL N/A 09/03/2020   Procedure: ESOPHAGOGASTRODUODENOSCOPY (EGD) WITH PROPOFOL;  Surgeon: [name], MD;  Location: [facility] ENDO SUITE;  Service: Gastroenterology;  Laterality: N/A;   GIVENS CAPSULE STUDY N/A 09/03/2020   Procedure: GIVENS CAPSULE STUDY;  Surgeon: [name], MD;  Location: [facility] END[facility];  Service: Gastroenterology;  Laterality: N/A;  possible capsule deployment if  egd unremarkable   implantable loop recorder removal  10/14/2018   MDT Reveal LINQ removed in office by Dr [name]   TEE WITHOUT CARDIOVERSION N/A 07/21/2014   Procedure: TRANSESOPHAGEAL ECHOCARDIOGRAM (TEE);  Surgeon: [name], MD;  Location: [facility] ENDOSCOPY;  Service: Cardiovascular;  Laterality: N/A;     OB History   No  obstetric history on file.     Family History  Problem Relation Age of Onset   Stroke Mother    Cancer Sister     Social History   Tobacco Use   Smoking status: Never   Smokeless tobacco: Never  Substance Use Topics   Alcohol use: No   Drug use: No    Home Medications Prior to Admission medications   Medication Sig Start Date End Date Taking? Authorizing Provider  acetaminophen (TYLENOL) 650 MG CR tablet Take 650 mg by mouth every 8 (eight) hours as needed for pain.    [provider]  alum & mag hydroxide-simeth (MAALOX/MYLANTA) 200-200-20 MG/5ML suspension Take 30 mLs by mouth every 4 (four) hours as needed for indigestion. 09/05/20   Shahmehdi, Valeria Batman, MD  amLODipine (NORVASC) 5 MG tablet Take 5 mg by mouth daily. 08/02/20   [provider]  apixaban (ELIQUIS) 2.5 MG TABS tablet Take 2.5 mg by mouth 2 (two) times daily.    [provider]  atorvastatin (LIPITOR) 80 MG tablet Take 1 tablet (80 mg total) by mouth every evening. 07/22/14   [name], MD  busPIRone (BUSPAR) 10 MG tablet Take 10 mg by mouth 2 (two) times daily. 07/14/20   [provider]  [employer] DICLOFENAC SODIUM 1 % GEL Apply 2 g topically 4 (four) times daily as needed (pain). 07/08/20   [provider]  escitalopram (LEXAPRO) 10 MG tablet Take 10 mg by mouth daily.    [provider]  ferrous sulfate 325 (65 FE) MG tablet Take 1 tablet by mouth 2 (two) times daily. 11/09/17   [provider]  furosemide (LASIX) 20 MG tablet Take 2 tablets (40 mg total) by mouth daily. 07/26/20   Barton Dubois, MD  gabapentin (NEURONTIN) 300 MG capsule Take 1 capsule by mouth daily as needed (nerve pain). 01/03/18   [provider]  isosorbide dinitrate (ISORDIL) 30 MG tablet Take 30 mg by mouth 3 (three) times daily.  09/18/15   [provider]  levothyroxine (SYNTHROID) 125 MCG tablet Take 125 mcg by mouth daily. 09/07/20   [provider]   pantoprazole (PROTONIX) 40 MG tablet TAKE 1 TABLET BY MOUTH TWICE A DAY 10/14/20   Montez Morita, Quillian Quince, MD  sodium bicarbonate 650 MG tablet Take 1,300 mg by mouth 2 (two) times daily.    [provider]  Vitamins A & D (VITAMIN A & D) ointment Apply 1 application topically as needed for dry skin. 11/09/17   [provider]    Allergies    Ampicillin  Review of Systems   Review of Systems  Constitutional:  Positive for fatigue.  Cardiovascular:  Positive for leg swelling.  Gastrointestinal:  Positive for anal bleeding.  All other systems reviewed and are negative.  Physical Exam Updated Vital Signs BP 130/67   Pulse 79   Temp (!) 97.1 F (36.2 C)   Resp 18   Ht 5\' 7"  (1.702 m)   Wt 74.4 kg   SpO2 96%   BMI 25.69 kg/m   Physical Exam Vitals and nursing note reviewed.  Constitutional:  General: She is not in acute distress.    Appearance: Normal appearance. She is well-developed.  HENT:     Head: Normocephalic and atraumatic.     Right Ear: Hearing normal.     Left Ear: Hearing normal.     Nose: Nose normal.  Eyes:     Conjunctiva/sclera: Conjunctivae normal.     Pupils: Pupils are equal, round, and reactive to light.  Cardiovascular:     Rate and Rhythm: Rhythm irregularly irregular.     Heart sounds: S1 normal and S2 normal. No murmur heard.   No friction rub. No gallop.  Pulmonary:     Effort: Pulmonary effort is normal. No respiratory distress.     Breath sounds: Normal breath sounds.  Chest:     Chest wall: No tenderness.  Abdominal:     General: Bowel sounds are normal.     Palpations: Abdomen is soft.     Tenderness: There is no abdominal tenderness. There is no guarding or rebound. Negative signs include Murphy's sign and McBurney's sign.     Hernia: No hernia is present.  Musculoskeletal:        General: Normal range of motion.     Cervical back: Normal range of motion and neck supple.     Right lower leg: Edema present.      Left lower leg: Edema present.  Skin:    General: Skin is warm and dry.     Findings: No rash.  Neurological:     Mental Status: She is alert and oriented to person, place, and time.     GCS: GCS eye subscore is 4. GCS verbal subscore is 5. GCS motor subscore is 6.     Cranial Nerves: No cranial nerve deficit.     Sensory: No sensory deficit.     Coordination: Coordination normal.  Psychiatric:        Speech: Speech normal.        Behavior: Behavior normal.        Thought Content: Thought content normal.    ED Results / Procedures / Treatments   Labs (all labs ordered are listed, but only abnormal results are displayed) Labs Reviewed  COMPREHENSIVE METABOLIC PANEL - Abnormal; Notable for the following components:      Result Value   Potassium 5.7 (*)    CO2 19 (*)    Glucose, Bld 219 (*)    BUN 143 (*)    Creatinine, Ser 4.05 (*)    Calcium 7.4 (*)    Total Protein 6.0 (*)    Albumin 2.2 (*)    GFR, Estimated 11 (*)    All other components within normal limits  CBC - Abnormal; Notable for the following components:   RBC 1.87 (*)    Hemoglobin 4.4 (*)    HCT 14.7 (*)    MCV 78.6 (*)    MCH 23.5 (*)    MCHC 29.9 (*)    RDW 19.2 (*)    nRBC 0.9 (*)    All other components within normal limits  BRAIN NATRIURETIC PEPTIDE - Abnormal; Notable for the following components:   B Natriuretic Peptide 695.0 (*)    All other components within normal limits  RESP PANEL BY RT-PCR (FLU A&B, COVID) ARPGX2  POC OCCULT BLOOD, ED  TYPE AND SCREEN  PREPARE RBC (CROSSMATCH)  TROPONIN I (HIGH SENSITIVITY)    EKG EKG Interpretation  Date/Time:  Saturday December 25 2020 00:25:50 EST Ventricular Rate:  72 PR Interval:  QRS Duration: 90 QT Interval:  405 QTC Calculation: 444 R Axis:   75 Text Interpretation: Atrial fibrillation Low voltage, extremity leads Confirmed by Orpah Greek [phone]) on 12/25/2020 12:59:14 AM  Radiology DG Chest Port 1 View  Result Date:  12/25/2020 CLINICAL DATA:  Edema EXAM: PORTABLE CHEST 1 VIEW COMPARISON:  09/02/2020 FINDINGS: Cardiomegaly with mild interstitial edema. Layering small bilateral pleural effusions, right greater than left, improved. No pneumothorax. Thoracic aortic atherosclerosis. IMPRESSION: Cardiomegaly with mild interstitial edema and small layering bilateral pleural effusions, right greater than left. Electronically Signed   By: Julian Hy M.D.   On: 12/25/2020 01:38    Procedures .Critical Care Performed by: Orpah Greek, MD Authorized by: Orpah Greek, MD   Critical care provider statement:    Critical care time (minutes):  31   Critical care was time spent personally by me on the following activities:  Development of treatment plan with patient or surrogate, discussions with consultants, evaluation of patient's response to treatment, examination of patient, ordering and review of laboratory studies, ordering and review of radiographic studies, ordering and performing treatments and interventions, pulse oximetry, re-evaluation of patient's condition and review of old charts   Medications Ordered in ED Medications  0.9 %  sodium chloride infusion (has no administration in time range)  pantoprazole (PROTONIX) 80 mg /NS 100 mL IVPB (has no administration in time range)  pantoprozole (PROTONIX) 80 mg /NS 100 mL infusion (has no administration in time range)  pantoprazole (PROTONIX) injection 40 mg (has no administration in time range)  sodium zirconium cyclosilicate (LOKELMA) packet 10 g (10 g Oral Given 12/25/20 0054)    ED Course  I have reviewed the triage vital signs and the nursing notes.  Pertinent labs & imaging results that were available during my care of the patient were reviewed by me and considered in my medical decision making (see chart for details).    MDM Rules/Calculators/A&P                           Patient presents to the emergency department for  evaluation of multiple problems.  Patient experiencing swelling that has been increasing over a number of weeks.  She does have history of congestive heart failure.  She is taking her Lasix as prescribed but swelling has worsened.  Family has noticed decreased urine output recently, likely not responding to the oral diuretic at this time.  Patient now has confounding findings of GI bleeding.  She is on Eliquis secondary to chronic atrial fibrillation.  Hemoglobin is down to 4.4, will require transfusion.  This will be complicated by her volume overload.  She has not had any acute rectal bleeding here in the emergency department.  She does not appear to have any indication for emergent correction of her anticoagulation at this time, but would need to monitor.  If bleeding worsens, consider pharmacologic correction.  Hold Eliquis for now.  Records indicate an acute GI bleed requiring transfusion in August.  At that time she had EGD that showed only gastritis, no ulcers.  Endoscopic capsule was also deployed, no active bleeding noted.  Patient does have chronic renal insufficiency.  She has significant worsening today indicating acute kidney injury.  Creatinine above 4.  Additionally she has mild hyperkalemia, no significant EKG changes.  Treated with Lokelma.  Discussed briefly with Dr. Marylou Flesher, on-call for nephrology.  Does not recommend any Lasix currently.  Recommends giving a unit of  blood slowly and assessing.  If she does well, can give additional units.  If she becomes more short of breath, recommends Lasix 80 to 120 mg IV.     Final Clinical Impression(s) / ED Diagnoses Final diagnoses:  Acute GI bleeding    Rx / DC Orders ED Discharge Orders     None        Mechille Varghese, Gwenyth Allegra, MD 12/25/20 [phone]

## 2020-12-25 NOTE — ED Notes (Signed)
Daughter paula  [phone]

## 2020-12-25 NOTE — Progress Notes (Signed)
   Patient seen and examined at bedside, patient admitted after midnight, please see earlier detailed admission note by [place] B Zierle-Ghosh, DO. Briefly, patient presented secondary to hematochezia, concerning for a GI bleed. Associated malaise. Blood transfusion ordered on admission. GI on board and plan for colonoscopy.  Subjective: Feeling better since receiving the blood transfusions.  BP (!) 141/53   Pulse 66   Temp (!) 97.4 F (36.3 C) (Oral)   Resp 17   Ht 5\' 7"  (1.702 m)   Wt 74.4 kg   SpO2 97%   BMI 25.69 kg/m   General exam: Appears calm and comfortable Respiratory system: Clear to auscultation. Respiratory effort normal. Cardiovascular system: S1 & S2 heard, RRR. 2/6 systolic murmur Gastrointestinal system: Abdomen is nondistended, soft and nontender. No organomegaly or masses felt. Normal bowel sounds heard. Central nervous system: Alert and oriented. No focal neurological deficits. Musculoskeletal: 2+ BLE edema. No calf tenderness Skin: No cyanosis. No rashes Psychiatry: Judgement and insight appear normal. Mood & affect appropriate.   Brief assessment/Plan:  Hematochezia GI Bleeding Recurrent issue. Most recent episode requiring admission in 8 of 2022. Severe associated anemia this admission. Patient reports a history of colonoscopy, but unsure of when. No colonoscopy performed on previous admission. -Continue blood transfusion (3 units ordered) -GI to see today and plan for colonoscopy in AM (12/11) -Trend CBC/H&H  Acute anemia Chronic anemia Symptomatic anemia Previous baseline hemoglobin of 8.6 in 08/2020. Most recent iron panel with normal ferritin and undetectable iron but with concurrent low TIBC. Chronic anemia likely from GI loss but iron panel suggests possible kidney disease component. Acute in setting of hematochezia as mentioned above. 3 units of PRBC ordered on admission. Patient has improved symptomatically. -Continue blood  transfusions -Post-transfusion H&H/CBC  AKI Repeat metabolic panel is pending. No hypotension noted. Patient with some BLE edema and mild pulmonary edema on chest x-ray; on room air. Started on sodium bicarbonate for associated mild metabolic acidosis. -Follow-up CMP -Renal ultrasound  Pulmonary edema Diastolic heart failure Mild. Patient is on Lasix as an outpatient. Last EF of 55-60% from 07/2020. Lasix held secondary to severe anemia. -May need to restart Lasix if develops hypoxia or once hemoglobin stabilized  Hyperkalemia Lokelma given. Awaiting CMP this morning.   Family communication: None at bedside DVT prophylaxis: SCDs Disposition: Transfer to [place] discharge home in 2-4 days pending GI recommendations/management and stabilization of hemoglobin.  Cordelia Poche, MD [facility] 12/25/2020, 7:51 AM

## 2020-12-25 NOTE — ED Notes (Signed)
Upon assessment this nurse noticed dark red substance in suction canister. This nurse called hospitalist to inform him on the change. Hospitalist ordered I/O cath to determine where blood is coming from. I/O cath was clear. Peri care provided, new brief and purwick placed.

## 2020-12-25 NOTE — ED Notes (Signed)
Pt notified this nurse that she usually takes lasix. This nurse contacted hospitalist for lasix order and hospitalist stated that it would not be ordered. Pt updated.

## 2020-12-25 NOTE — ED Notes (Signed)
Lab at bedside

## 2020-12-25 NOTE — H&P (View-Only) (Signed)
Consulting  Provider: Dr. Lonny Prude Primary Care Physician:  Kendrick Ranch, MD Primary Gastroenterologist:  Dr. Jenetta Downer  Reason for Consultation: Acute GI bleeding, acute blood loss anemia  HPI:  Beth Padilla is a 80 y.o. female with a past medical history of chronic kidney disease, atrial fibrillation on Eliquis, aortic stenosis, previous stroke, diabetes, hypertension, dyslipidemia, heart failure, history of previous gastric ulcer, who presented to [facility], ER early this a.m. with rectal bleeding.  Notes multiple episodes of red blood in her stool over the past 48 to 72 hours.  Possible melena as well.  No abdominal pain or cramping.  Does note worsening swelling of her legs.  In the ED, found to have hemoglobin of 4.4 up to 7.4 after receiving PRBCs.  Also with significantly elevated creatinine of 4.05, potassium 5.7, bicarb 19 BUN 143, BNP 695.  Chest x-ray showed mild interstitial edema and small bilateral pleural effusions.  GI history wise, she has a history of upper GI bleed in the past due to gastric ulcer on 08/03/2020, EGD showed clean-based ulcer in the antrum biopsies negative for H. pylori or dysplasia. Liver elastography was inconsistent with advanced liver disease but patient noted to have nodularity on liver imaging 2021.  Also noted to have heterogeneity in the pancreatic head and uncinate process with recommendations for repeat imaging of the pancreatic head at a later date.  MRCP planned for further liver/pancreatic imaging.  Past Medical History:  Diagnosis Date   Anemia    Atrial fibrillation (HCC)    CHF (congestive heart failure) ([place])    Coronary artery disease    Depression    Diabetes mellitus    GERD (gastroesophageal reflux disease)    Hyperlipidemia 12/15/2010   Hypertension    Hypothyroidism 12/15/2010   Pneumonia    Renal disorder    Stroke [facility])     Past Surgical History:  Procedure Laterality Date   ABDOMINAL HYSTERECTOMY      BIOPSY  08/05/2020   Procedure: BIOPSY;  Surgeon: [name], DO;  [facility]: [facility];  Service: Endoscopy;;   EP IMPLANTABLE DEVICE N/A 07/21/2014   Procedure: Loop Recorder Insertion;  Surgeon: [name], MD;  Location: [place] CV LAB;  Service: Cardiovascular;  Laterality: N/A;   ESOPHAGOGASTRODUODENOSCOPY (EGD) WITH PROPOFOL N/A 08/05/2020   Procedure: ESOPHAGOGASTRODUODENOSCOPY (EGD) WITH PROPOFOL;  Surgeon: [name], DO;  Location: [facility];  Service: Endoscopy;  Laterality: N/A;   ESOPHAGOGASTRODUODENOSCOPY (EGD) WITH PROPOFOL N/A 09/03/2020   Procedure: ESOPHAGOGASTRODUODENOSCOPY (EGD) WITH PROPOFOL;  Surgeon: [name], MD;  Location: [facility];  Service: Gastroenterology;  Laterality: N/A;   GIVENS CAPSULE STUDY N/A 09/03/2020   Procedure: GIVENS CAPSULE STUDY;  Surgeon: [name], MD;  Location: [facility];  Service: Gastroenterology;  Laterality: N/A;  possible capsule deployment if egd unremarkable   implantable loop recorder removal  10/14/2018   MDT Reveal LINQ removed in office by Dr [name]   TEE WITHOUT CARDIOVERSION N/A 07/21/2014   Procedure: TRANSESOPHAGEAL ECHOCARDIOGRAM (TEE);  Surgeon: [name], MD;  Location: [facility] ENDOSCOPY;  Service: Cardiovascular;  Laterality: N/A;    Prior to Admission medications   Medication Sig Start Date End Date Taking? Authorizing Provider  acetaminophen (TYLENOL) 650 MG CR tablet Take 650 mg by mouth every 8 (eight) hours as needed for pain.   Yes [provider]  alum & mag hydroxide-simeth (MAALOX/MYLANTA) 200-200-20 MG/5ML suspension Take 30 mLs by mouth every 4 (four) hours as  needed for indigestion. 09/05/20  Yes Shahmehdi, Seyed A, MD  amLODipine (NORVASC) 5 MG tablet Take 5 mg by mouth daily. 08/02/20  Yes [provider]  apixaban (ELIQUIS) 2.5 MG TABS tablet Take 2.5 mg by mouth 2 (two) times daily.   Yes [provider]  atorvastatin (LIPITOR)  80 MG tablet Take 1 tablet (80 mg total) by mouth every evening. 07/22/14  Yes [name], MD  busPIRone (BUSPAR) 10 MG tablet Take 10 mg by mouth 2 (two) times daily. 07/14/20  Yes [provider]  [employer] DICLOFENAC SODIUM 1 % GEL Apply 2 g topically 4 (four) times daily as needed (pain). 07/08/20  Yes [provider]  escitalopram (LEXAPRO) 10 MG tablet Take 10 mg by mouth daily.   Yes [provider]  ferrous sulfate 325 (65 FE) MG tablet Take 1 tablet by mouth 2 (two) times daily. 11/09/17  Yes [provider]  furosemide (LASIX) 20 MG tablet Take 2 tablets (40 mg total) by mouth daily. 07/26/20  Yes Barton Dubois, MD  gabapentin (NEURONTIN) 300 MG capsule Take 1 capsule by mouth daily as needed (nerve pain). 01/03/18  Yes [provider]  isosorbide dinitrate (ISORDIL) 30 MG tablet Take 30 mg by mouth 3 (three) times daily.  09/18/15  Yes [provider]  levothyroxine (SYNTHROID) 125 MCG tablet Take 125 mcg by mouth daily. 09/07/20  Yes [provider]  pantoprazole (PROTONIX) 40 MG tablet TAKE 1 TABLET BY MOUTH TWICE A DAY Patient taking differently: 40 mg daily. 10/14/20  Yes [name], MD  sodium bicarbonate 650 MG tablet Take 1,300 mg by mouth 2 (two) times daily.   Yes [provider]  valsartan (DIOVAN) 80 MG tablet Take 80 mg by mouth daily. 10/27/20  Yes [provider]  Vitamins A & D (VITAMIN A & D) ointment Apply 1 application topically as needed for dry skin. 11/09/17  Yes [provider]    Current Facility-Administered Medications  Medication Dose Route Frequency Provider Last Rate Last Admin   acetaminophen (TYLENOL) tablet 650 mg  650 mg Oral Q6H PRN Zierle-Ghosh, Asia B, DO       Or   acetaminophen (TYLENOL) suppository 650 mg  650 mg Rectal Q6H PRN Zierle-Ghosh, Asia B, DO       atorvastatin (LIPITOR) tablet 80 mg  80 mg Oral QPM Zierle-Ghosh, Asia B, DO       busPIRone  (BUSPAR) tablet 10 mg  10 mg Oral BID Zierle-Ghosh, Asia B, DO   10 mg at 12/25/20 1021   escitalopram (LEXAPRO) tablet 10 mg  10 mg Oral Daily Zierle-Ghosh, Asia B, DO   10 mg at 12/25/20 1021   ferrous sulfate tablet 325 mg  325 mg Oral BID Zierle-Ghosh, Asia B, DO   325 mg at 12/25/20 1023   gabapentin (NEURONTIN) capsule 300 mg  300 mg Oral Daily PRN Zierle-Ghosh, Asia B, DO       isosorbide dinitrate (ISORDIL) tablet 30 mg  30 mg Oral TID Zierle-Ghosh, Asia B, DO   30 mg at 12/25/20 1020   levothyroxine (SYNTHROID) tablet 125 mcg  125 mcg Oral Daily Zierle-Ghosh, Asia B, DO   125 mcg at 12/25/20 1030   morphine 2 MG/ML injection 2 mg  2 mg Intravenous Q2H PRN Zierle-Ghosh, Asia B, DO       ondansetron (ZOFRAN) tablet 4 mg  4 mg Oral Q6H PRN Zierle-Ghosh, Asia B, DO       Or  ondansetron (ZOFRAN) injection 4 mg  4 mg Intravenous Q6H PRN Zierle-Ghosh, Asia B, DO       oxyCODONE (Oxy IR/ROXICODONE) immediate release tablet 5 mg  5 mg Oral Q4H PRN Zierle-Ghosh, Asia B, DO       [START ON 12/28/2020] pantoprazole (PROTONIX) injection 40 mg  40 mg Intravenous Q12H Zierle-Ghosh, Asia B, DO       pantoprozole (PROTONIX) 80 mg /NS 100 mL infusion  8 mg/hr Intravenous Continuous Zierle-Ghosh, Asia B, DO 10 mL/hr at 12/25/20 1321 8 mg/hr at 12/25/20 1321   sodium bicarbonate tablet 1,300 mg  1,300 mg Oral BID Zierle-Ghosh, Asia B, DO   1,300 mg at 12/25/20 1022   Current Outpatient Medications  Medication Sig Dispense Refill   acetaminophen (TYLENOL) 650 MG CR tablet Take 650 mg by mouth every 8 (eight) hours as needed for pain.     alum & mag hydroxide-simeth (MAALOX/MYLANTA) 200-200-20 MG/5ML suspension Take 30 mLs by mouth every 4 (four) hours as needed for indigestion. 355 mL 0   amLODipine (NORVASC) 5 MG tablet Take 5 mg by mouth daily.     apixaban (ELIQUIS) 2.5 MG TABS tablet Take 2.5 mg by mouth 2 (two) times daily.     atorvastatin (LIPITOR) 80 MG tablet Take 1 tablet (80 mg total) by mouth  every evening. 30 tablet 2   busPIRone (BUSPAR) 10 MG tablet Take 10 mg by mouth 2 (two) times daily.     [employer] DICLOFENAC SODIUM 1 % GEL Apply 2 g topically 4 (four) times daily as needed (pain).     escitalopram (LEXAPRO) 10 MG tablet Take 10 mg by mouth daily.     ferrous sulfate 325 (65 FE) MG tablet Take 1 tablet by mouth 2 (two) times daily.     furosemide (LASIX) 20 MG tablet Take 2 tablets (40 mg total) by mouth daily.     gabapentin (NEURONTIN) 300 MG capsule Take 1 capsule by mouth daily as needed (nerve pain).     isosorbide dinitrate (ISORDIL) 30 MG tablet Take 30 mg by mouth 3 (three) times daily.      levothyroxine (SYNTHROID) 125 MCG tablet Take 125 mcg by mouth daily.     pantoprazole (PROTONIX) 40 MG tablet TAKE 1 TABLET BY MOUTH TWICE A DAY (Patient taking differently: 40 mg daily.) 180 tablet 0   sodium bicarbonate 650 MG tablet Take 1,300 mg by mouth 2 (two) times daily.     valsartan (DIOVAN) 80 MG tablet Take 80 mg by mouth daily.     Vitamins A & D (VITAMIN A & D) ointment Apply 1 application topically as needed for dry skin.      Allergies as of 12/24/2020 - Review Complete 12/24/2020  Allergen Reaction Noted   Ampicillin Rash 09/20/2012    Family History  Problem Relation Age of Onset   Stroke Mother    Cancer Sister     Social History   Socioeconomic History   Marital status: Widowed    Spouse name: Not on file   Number of children: 5   Years of education: 70   Highest education level: Not on file  Occupational History   Occupation: retired  Tobacco Use   Smoking status: Never   Smokeless tobacco: Never  Substance and Sexual Activity   Alcohol use: No   Drug use: No   Sexual activity: Yes    Birth control/protection: Surgical  Other Topics Concern   Not on file  Social History Narrative  Patient drinks about 2-3 cups of caffeine daily.   Patient is ambidextrous.   Social Determinants of Health   Financial Resource Strain: Not on file  Food  Insecurity: Not on file  Transportation Needs: Not on file  Physical Activity: Not on file  Stress: Not on file  Social Connections: Not on file  Intimate Partner Violence: Not on file    Review of Systems: General: Negative for anorexia, weight loss, fever, chills, fatigue, weakness. Eyes: Negative for vision changes.  ENT: Negative for hoarseness, difficulty swallowing , nasal congestion. CV: Negative for chest pain, angina, palpitations, dyspnea on exertion, peripheral edema.  Respiratory: Negative for dyspnea at rest, dyspnea on exertion, cough, sputum, wheezing.  GI: See history of present illness. GU:  Negative for dysuria, hematuria, urinary incontinence, urinary frequency, nocturnal urination.  MS: Negative for joint pain, low back pain.  Derm: Negative for rash or itching.  Neuro: Negative for weakness, abnormal sensation, seizure, frequent headaches, memory loss, confusion.  Psych: Negative for anxiety, depression Endo: Negative for unusual weight change.  Heme: Negative for bruising or bleeding. Allergy: Negative for rash or hives.  Physical Exam: Vital signs in last 24 hours: Temp:  [97.1 F (36.2 C)-98.2 F (36.8 C)] 98.2 F (36.8 C) (12/10 1025) Pulse Rate:  [53-100] 60 (12/10 1330) Resp:  [11-22] 16 (12/10 1330) BP: (125-167)/(44-78) 136/44 (12/10 1330) SpO2:  [94 %-100 %] 96 % (12/10 1330) Weight:  [74.4 kg] 74.4 kg (12/09 2212)   General:   Alert,  Well-developed, well-nourished, pleasant and cooperative in NAD Head:  Normocephalic and atraumatic. Eyes:  Sclera clear, no icterus.   Conjunctiva pink. Ears:  Normal auditory acuity. Nose:  No deformity, discharge,  or lesions. Mouth:  No deformity or lesions, dentition normal. Neck:  Supple; no masses or thyromegaly. Lungs:  Clear throughout to auscultation.   No wheezes, crackles, or rhonchi. No acute distress. Heart:  Regular rate and rhythm; no murmurs, clicks, rubs,  or gallops. Abdomen:  Soft, nontender  and nondistended. No masses, hepatosplenomegaly or hernias noted. Normal bowel sounds, without guarding, and without rebound.   Msk:  Symmetrical without gross deformities. Normal posture. Pulses:  Normal pulses noted. Extremities:  Without clubbing or edema. Neurologic:  Alert and  oriented x4;  grossly normal neurologically. Skin:  Intact without significant lesions or rashes. Cervical Nodes:  No significant cervical adenopathy. Psych:  Alert and cooperative. Normal mood and affect.  Intake/Output from previous day: 12/09 0701 - 12/10 0700 In: 315 [Blood:315] Out: -  Intake/Output this shift: Total I/O In: 1522.8 [P.O.:200; I.V.:45.8; Blood:1277] Out: 400 [Urine:400]  Lab Results: Recent Labs    12/24/20 2302 12/25/20 1151  WBC 5.8 7.9  HGB 4.4* 7.4*  HCT 14.7* 22.9*  PLT 225 180   BMET Recent Labs    12/24/20 2302 12/25/20 1151  NA 136 139  K 5.7* 5.4*  CL 107 110  CO2 19* 20*  GLUCOSE 219* 174*  BUN 143* 146*  CREATININE 4.05* 3.89*  CALCIUM 7.4* 7.4*   LFT Recent Labs    12/24/20 2302 12/25/20 1151  PROT 6.0* 5.7*  ALBUMIN 2.2* 2.1*  AST 22 22  ALT 20 17  ALKPHOS 68 62  BILITOT 0.5 0.9   PT/INR Recent Labs    12/25/20 1151  LABPROT 18.6*  INR 1.6*   Hepatitis Panel No results for input(s): HEPBSAG, HCVAB, HEPAIGM, HEPBIGM in the last 72 hours. C-Diff No results for input(s): CDIFFTOX in the last 72 hours.  Studies/Results: DG Chest  Port 1 View  Result Date: 12/25/2020 CLINICAL DATA:  Edema EXAM: PORTABLE CHEST 1 VIEW COMPARISON:  09/02/2020 FINDINGS: Cardiomegaly with mild interstitial edema. Layering small bilateral pleural effusions, right greater than left, improved. No pneumothorax. Thoracic aortic atherosclerosis. IMPRESSION: Cardiomegaly with mild interstitial edema and small layering bilateral pleural effusions, right greater than left. Electronically Signed   By: Julian Hy M.D.   On: 12/25/2020 01:38    Impression: *Acute  GI bleed-source likely lower *Acute blood loss anemia due to above *Acute kidney injury  Plan: Etiology of patient's GI bleed likely lower, differential includes diverticular, hemorrhoidal, AVMs, polyps, malignancy, or other.  Given her history of gastric ulcer bleed in the past, cannot rule out brisk upper bleed as well given her significantly elevated BUN.  She warrants further evaluation with EGD and colonoscopy though we will hold off for now given her significant anemia as well as acute kidney injury.  Needs adequate resuscitation today.  Continue to monitor H&H and transfuse for less than 7.  Hopefully her renal function improves well enough that she can perform oral colon preparation.  I would hold off on this for now.  Agree with holding Eliquis.  Agree with IV Protonix.  Clear liquid diet today.  GI to continue to follow.  Elon Alas. Abbey Chatters, D.O. Gastroenterology and Hepatology [facility]    LOS: 0 days     12/25/2020, 2:01 PM

## 2020-12-25 NOTE — ED Notes (Signed)
Pharmacy called for protonix.

## 2020-12-25 NOTE — Consult Note (Signed)
Consulting  Provider: Dr. Lonny Prude Primary Care Physician:  Kendrick Ranch, MD Primary Gastroenterologist:  Dr. Jenetta Downer  Reason for Consultation: Acute GI bleeding, acute blood loss anemia  HPI:  Beth Padilla is a 80 y.o. female with a past medical history of chronic kidney disease, atrial fibrillation on Eliquis, aortic stenosis, previous stroke, diabetes, hypertension, dyslipidemia, heart failure, history of previous gastric ulcer, who presented to [facility], ER early this a.m. with rectal bleeding.  Notes multiple episodes of red blood in her stool over the past 48 to 72 hours.  Possible melena as well.  No abdominal pain or cramping.  Does note worsening swelling of her legs.  In the ED, found to have hemoglobin of 4.4 up to 7.4 after receiving PRBCs.  Also with significantly elevated creatinine of 4.05, potassium 5.7, bicarb 19 BUN 143, BNP 695.  Chest x-ray showed mild interstitial edema and small bilateral pleural effusions.  GI history wise, she has a history of upper GI bleed in the past due to gastric ulcer on 08/03/2020, EGD showed clean-based ulcer in the antrum biopsies negative for H. pylori or dysplasia. Liver elastography was inconsistent with advanced liver disease but patient noted to have nodularity on liver imaging 2021.  Also noted to have heterogeneity in the pancreatic head and uncinate process with recommendations for repeat imaging of the pancreatic head at a later date.  MRCP planned for further liver/pancreatic imaging.  Past Medical History:  Diagnosis Date   Anemia    Atrial fibrillation (HCC)    CHF (congestive heart failure) ([place])    Coronary artery disease    Depression    Diabetes mellitus    GERD (gastroesophageal reflux disease)    Hyperlipidemia 12/15/2010   Hypertension    Hypothyroidism 12/15/2010   Pneumonia    Renal disorder    Stroke [facility])     Past Surgical History:  Procedure Laterality Date   ABDOMINAL HYSTERECTOMY      BIOPSY  08/05/2020   Procedure: BIOPSY;  Surgeon: [name], DO;  Location: [facility];  Service: Endoscopy;;   EP IMPLANTABLE DEVICE N/A 07/21/2014   Procedure: Loop Recorder Insertion;  Surgeon: [name], MD;  Location: [place] [facility];  Service: Cardiovascular;  Laterality: N/A;   ESOPHAGOGASTRODUODENOSCOPY (EGD) WITH PROPOFOL N/A 08/05/2020   Procedure: ESOPHAGOGASTRODUODENOSCOPY (EGD) WITH PROPOFOL;  Surgeon: [name], DO;  Location: [facility];  Service: Endoscopy;  Laterality: N/A;   ESOPHAGOGASTRODUODENOSCOPY (EGD) WITH PROPOFOL N/A 09/03/2020   Procedure: ESOPHAGOGASTRODUODENOSCOPY (EGD) WITH PROPOFOL;  Surgeon: [name], MD;  Location: [facility];  Service: Gastroenterology;  Laterality: N/A;   GIVENS CAPSULE STUDY N/A 09/03/2020   Procedure: GIVENS CAPSULE STUDY;  Surgeon: [name], MD;  Location: [facility];  Service: Gastroenterology;  Laterality: N/A;  possible capsule deployment if egd unremarkable   implantable loop recorder removal  10/14/2018   MDT Reveal LINQ removed in office by Dr [name]   TEE WITHOUT CARDIOVERSION N/A 07/21/2014   Procedure: TRANSESOPHAGEAL ECHOCARDIOGRAM (TEE);  Surgeon: [name], MD;  Location: [facility] ENDOSCOPY;  Service: Cardiovascular;  Laterality: N/A;    Prior to Admission medications   Medication Sig Start Date End Date Taking? Authorizing Provider  acetaminophen (TYLENOL) 650 MG CR tablet Take 650 mg by mouth every 8 (eight) hours as needed for pain.   Yes [provider]  alum & mag hydroxide-simeth (MAALOX/MYLANTA) 200-200-20 MG/5ML suspension Take 30 mLs by mouth every 4 (four) hours as  needed for indigestion. 09/05/20  Yes Shahmehdi, Seyed A, MD  amLODipine (NORVASC) 5 MG tablet Take 5 mg by mouth daily. 08/02/20  Yes [provider]  apixaban (ELIQUIS) 2.5 MG TABS tablet Take 2.5 mg by mouth 2 (two) times daily.   Yes [provider]  atorvastatin (LIPITOR)  80 MG tablet Take 1 tablet (80 mg total) by mouth every evening. 07/22/14  Yes [name], MD  busPIRone (BUSPAR) 10 MG tablet Take 10 mg by mouth 2 (two) times daily. 07/14/20  Yes [provider]  [employer] DICLOFENAC SODIUM 1 % GEL Apply 2 g topically 4 (four) times daily as needed (pain). 07/08/20  Yes [provider]  escitalopram (LEXAPRO) 10 MG tablet Take 10 mg by mouth daily.   Yes [provider]  ferrous sulfate 325 (65 FE) MG tablet Take 1 tablet by mouth 2 (two) times daily. 11/09/17  Yes [provider]  furosemide (LASIX) 20 MG tablet Take 2 tablets (40 mg total) by mouth daily. 07/26/20  Yes Barton Dubois, MD  gabapentin (NEURONTIN) 300 MG capsule Take 1 capsule by mouth daily as needed (nerve pain). 01/03/18  Yes [provider]  isosorbide dinitrate (ISORDIL) 30 MG tablet Take 30 mg by mouth 3 (three) times daily.  09/18/15  Yes [provider]  levothyroxine (SYNTHROID) 125 MCG tablet Take 125 mcg by mouth daily. 09/07/20  Yes [provider]  pantoprazole (PROTONIX) 40 MG tablet TAKE 1 TABLET BY MOUTH TWICE A DAY Patient taking differently: 40 mg daily. 10/14/20  Yes [name], MD  sodium bicarbonate 650 MG tablet Take 1,300 mg by mouth 2 (two) times daily.   Yes [provider]  valsartan (DIOVAN) 80 MG tablet Take 80 mg by mouth daily. 10/27/20  Yes [provider]  Vitamins A & D (VITAMIN A & D) ointment Apply 1 application topically as needed for dry skin. 11/09/17  Yes [provider]    Current Facility-Administered Medications  Medication Dose Route Frequency Provider Last Rate Last Admin   acetaminophen (TYLENOL) tablet 650 mg  650 mg Oral Q6H PRN Zierle-Ghosh, Asia B, DO       Or   acetaminophen (TYLENOL) suppository 650 mg  650 mg Rectal Q6H PRN Zierle-Ghosh, Asia B, DO       atorvastatin (LIPITOR) tablet 80 mg  80 mg Oral QPM Zierle-Ghosh, Asia B, DO       busPIRone  (BUSPAR) tablet 10 mg  10 mg Oral BID Zierle-Ghosh, Asia B, DO   10 mg at 12/25/20 1021   escitalopram (LEXAPRO) tablet 10 mg  10 mg Oral Daily Zierle-Ghosh, Asia B, DO   10 mg at 12/25/20 1021   ferrous sulfate tablet 325 mg  325 mg Oral BID Zierle-Ghosh, Asia B, DO   325 mg at 12/25/20 1023   gabapentin (NEURONTIN) capsule 300 mg  300 mg Oral Daily PRN Zierle-Ghosh, Asia B, DO       isosorbide dinitrate (ISORDIL) tablet 30 mg  30 mg Oral TID Zierle-Ghosh, Asia B, DO   30 mg at 12/25/20 1020   levothyroxine (SYNTHROID) tablet 125 mcg  125 mcg Oral Daily Zierle-Ghosh, Asia B, DO   125 mcg at 12/25/20 1030   morphine 2 MG/ML injection 2 mg  2 mg Intravenous Q2H PRN Zierle-Ghosh, Asia B, DO       ondansetron (ZOFRAN) tablet 4 mg  4 mg Oral Q6H PRN Zierle-Ghosh, Asia B, DO       Or  ondansetron (ZOFRAN) injection 4 mg  4 mg Intravenous Q6H PRN Zierle-Ghosh, Asia B, DO       oxyCODONE (Oxy IR/ROXICODONE) immediate release tablet 5 mg  5 mg Oral Q4H PRN Zierle-Ghosh, Asia B, DO       [START ON 12/28/2020] pantoprazole (PROTONIX) injection 40 mg  40 mg Intravenous Q12H Zierle-Ghosh, Asia B, DO       pantoprozole (PROTONIX) 80 mg /NS 100 mL infusion  8 mg/hr Intravenous Continuous Zierle-Ghosh, Asia B, DO 10 mL/hr at 12/25/20 1321 8 mg/hr at 12/25/20 1321   sodium bicarbonate tablet 1,300 mg  1,300 mg Oral BID Zierle-Ghosh, Asia B, DO   1,300 mg at 12/25/20 1022   Current Outpatient Medications  Medication Sig Dispense Refill   acetaminophen (TYLENOL) 650 MG CR tablet Take 650 mg by mouth every 8 (eight) hours as needed for pain.     alum & mag hydroxide-simeth (MAALOX/MYLANTA) 200-200-20 MG/5ML suspension Take 30 mLs by mouth every 4 (four) hours as needed for indigestion. 355 mL 0   amLODipine (NORVASC) 5 MG tablet Take 5 mg by mouth daily.     apixaban (ELIQUIS) 2.5 MG TABS tablet Take 2.5 mg by mouth 2 (two) times daily.     atorvastatin (LIPITOR) 80 MG tablet Take 1 tablet (80 mg total) by mouth  every evening. 30 tablet 2   busPIRone (BUSPAR) 10 MG tablet Take 10 mg by mouth 2 (two) times daily.     [employer] DICLOFENAC SODIUM 1 % GEL Apply 2 g topically 4 (four) times daily as needed (pain).     escitalopram (LEXAPRO) 10 MG tablet Take 10 mg by mouth daily.     ferrous sulfate 325 (65 FE) MG tablet Take 1 tablet by mouth 2 (two) times daily.     furosemide (LASIX) 20 MG tablet Take 2 tablets (40 mg total) by mouth daily.     gabapentin (NEURONTIN) 300 MG capsule Take 1 capsule by mouth daily as needed (nerve pain).     isosorbide dinitrate (ISORDIL) 30 MG tablet Take 30 mg by mouth 3 (three) times daily.      levothyroxine (SYNTHROID) 125 MCG tablet Take 125 mcg by mouth daily.     pantoprazole (PROTONIX) 40 MG tablet TAKE 1 TABLET BY MOUTH TWICE A DAY (Patient taking differently: 40 mg daily.) 180 tablet 0   sodium bicarbonate 650 MG tablet Take 1,300 mg by mouth 2 (two) times daily.     valsartan (DIOVAN) 80 MG tablet Take 80 mg by mouth daily.     Vitamins A & D (VITAMIN A & D) ointment Apply 1 application topically as needed for dry skin.      Allergies as of 12/24/2020 - Review Complete 12/24/2020  Allergen Reaction Noted   Ampicillin Rash 09/20/2012    Family History  Problem Relation Age of Onset   Stroke Mother    Cancer Sister     Social History   Socioeconomic History   Marital status: Widowed    Spouse name: Not on file   Number of children: 5   Years of education: 28   Highest education level: Not on file  Occupational History   Occupation: retired  Tobacco Use   Smoking status: Never   Smokeless tobacco: Never  Substance and Sexual Activity   Alcohol use: No   Drug use: No   Sexual activity: Yes    Birth control/protection: Surgical  Other Topics Concern   Not on file  Social History Narrative  Patient drinks about 2-3 cups of caffeine daily.   Patient is ambidextrous.   Social Determinants of Health   Financial Resource Strain: Not on file  Food  Insecurity: Not on file  Transportation Needs: Not on file  Physical Activity: Not on file  Stress: Not on file  Social Connections: Not on file  Intimate Partner Violence: Not on file    Review of Systems: General: Negative for anorexia, weight loss, fever, chills, fatigue, weakness. Eyes: Negative for vision changes.  ENT: Negative for hoarseness, difficulty swallowing , nasal congestion. CV: Negative for chest pain, angina, palpitations, dyspnea on exertion, peripheral edema.  Respiratory: Negative for dyspnea at rest, dyspnea on exertion, cough, sputum, wheezing.  GI: See history of present illness. GU:  Negative for dysuria, hematuria, urinary incontinence, urinary frequency, nocturnal urination.  MS: Negative for joint pain, low back pain.  Derm: Negative for rash or itching.  Neuro: Negative for weakness, abnormal sensation, seizure, frequent headaches, memory loss, confusion.  Psych: Negative for anxiety, depression Endo: Negative for unusual weight change.  Heme: Negative for bruising or bleeding. Allergy: Negative for rash or hives.  Physical Exam: Vital signs in last 24 hours: Temp:  [97.1 F (36.2 C)-98.2 F (36.8 C)] 98.2 F (36.8 C) (12/10 1025) Pulse Rate:  [53-100] 60 (12/10 1330) Resp:  [11-22] 16 (12/10 1330) BP: (125-167)/(44-78) 136/44 (12/10 1330) SpO2:  [94 %-100 %] 96 % (12/10 1330) Weight:  [74.4 kg] 74.4 kg (12/09 2212)   General:   Alert,  Well-developed, well-nourished, pleasant and cooperative in NAD Head:  Normocephalic and atraumatic. Eyes:  Sclera clear, no icterus.   Conjunctiva pink. Ears:  Normal auditory acuity. Nose:  No deformity, discharge,  or lesions. Mouth:  No deformity or lesions, dentition normal. Neck:  Supple; no masses or thyromegaly. Lungs:  Clear throughout to auscultation.   No wheezes, crackles, or rhonchi. No acute distress. Heart:  Regular rate and rhythm; no murmurs, clicks, rubs,  or gallops. Abdomen:  Soft, nontender  and nondistended. No masses, hepatosplenomegaly or hernias noted. Normal bowel sounds, without guarding, and without rebound.   Msk:  Symmetrical without gross deformities. Normal posture. Pulses:  Normal pulses noted. Extremities:  Without clubbing or edema. Neurologic:  Alert and  oriented x4;  grossly normal neurologically. Skin:  Intact without significant lesions or rashes. Cervical Nodes:  No significant cervical adenopathy. Psych:  Alert and cooperative. Normal mood and affect.  Intake/Output from previous day: 12/09 0701 - 12/10 0700 In: 315 [Blood:315] Out: -  Intake/Output this shift: Total I/O In: 1522.8 [P.O.:200; I.V.:45.8; Blood:1277] Out: 400 [Urine:400]  Lab Results: Recent Labs    12/24/20 2302 12/25/20 1151  WBC 5.8 7.9  HGB 4.4* 7.4*  HCT 14.7* 22.9*  PLT 225 180   BMET Recent Labs    12/24/20 2302 12/25/20 1151  NA 136 139  K 5.7* 5.4*  CL 107 110  CO2 19* 20*  GLUCOSE 219* 174*  BUN 143* 146*  CREATININE 4.05* 3.89*  CALCIUM 7.4* 7.4*   LFT Recent Labs    12/24/20 2302 12/25/20 1151  PROT 6.0* 5.7*  ALBUMIN 2.2* 2.1*  AST 22 22  ALT 20 17  ALKPHOS 68 62  BILITOT 0.5 0.9   PT/INR Recent Labs    12/25/20 1151  LABPROT 18.6*  INR 1.6*   Hepatitis Panel No results for input(s): HEPBSAG, HCVAB, HEPAIGM, HEPBIGM in the last 72 hours. C-Diff No results for input(s): CDIFFTOX in the last 72 hours.  Studies/Results: DG Chest  Port 1 View  Result Date: 12/25/2020 CLINICAL DATA:  Edema EXAM: PORTABLE CHEST 1 VIEW COMPARISON:  09/02/2020 FINDINGS: Cardiomegaly with mild interstitial edema. Layering small bilateral pleural effusions, right greater than left, improved. No pneumothorax. Thoracic aortic atherosclerosis. IMPRESSION: Cardiomegaly with mild interstitial edema and small layering bilateral pleural effusions, right greater than left. Electronically Signed   By: Julian Hy M.D.   On: 12/25/2020 01:38    Impression: *Acute  GI bleed-source likely lower *Acute blood loss anemia due to above *Acute kidney injury  Plan: Etiology of patient's GI bleed likely lower, differential includes diverticular, hemorrhoidal, AVMs, polyps, malignancy, or other.  Given her history of gastric ulcer bleed in the past, cannot rule out brisk upper bleed as well given her significantly elevated BUN.  She warrants further evaluation with EGD and colonoscopy though we will hold off for now given her significant anemia as well as acute kidney injury.  Needs adequate resuscitation today.  Continue to monitor H&H and transfuse for less than 7.  Hopefully her renal function improves well enough that she can perform oral colon preparation.  I would hold off on this for now.  Agree with holding Eliquis.  Agree with IV Protonix.  Clear liquid diet today.  GI to continue to follow.  Elon Alas. Abbey Chatters, D.O. Gastroenterology and Hepatology [facility]    LOS: 0 days     12/25/2020, 2:01 PM

## 2020-12-26 ENCOUNTER — Inpatient Hospital Stay (HOSPITAL_COMMUNITY): Payer: Medicare Other

## 2020-12-26 DIAGNOSIS — I480 Paroxysmal atrial fibrillation: Secondary | ICD-10-CM

## 2020-12-26 DIAGNOSIS — E039 Hypothyroidism, unspecified: Secondary | ICD-10-CM

## 2020-12-26 DIAGNOSIS — N179 Acute kidney failure, unspecified: Secondary | ICD-10-CM

## 2020-12-26 DIAGNOSIS — I5032 Chronic diastolic (congestive) heart failure: Secondary | ICD-10-CM

## 2020-12-26 DIAGNOSIS — I10 Essential (primary) hypertension: Secondary | ICD-10-CM

## 2020-12-26 LAB — ECHOCARDIOGRAM COMPLETE
AR max vel: 1.15 cm2
AV Area VTI: 1.07 cm2
AV Area mean vel: 1.01 cm2
AV Mean grad: 24 mmHg
AV Peak grad: 35 mmHg
AV Vena cont: 0.5 cm
Ao pk vel: 2.96 m/s
Area-P 1/2: 3.99 cm2
MV M vel: 4.91 m/s
MV Peak grad: 96.4 mmHg
P 1/2 time: 617 msec
S' Lateral: 2.7 cm
Weight: 2687.85 oz

## 2020-12-26 LAB — CBC
HCT: 24.7 % — ABNORMAL LOW (ref 36.0–46.0)
Hemoglobin: 8 g/dL — ABNORMAL LOW (ref 12.0–15.0)
MCH: 27.8 pg (ref 26.0–34.0)
MCHC: 32.4 g/dL (ref 30.0–36.0)
MCV: 85.8 fL (ref 80.0–100.0)
Platelets: 182 10*3/uL (ref 150–400)
RBC: 2.88 MIL/uL — ABNORMAL LOW (ref 3.87–5.11)
RDW: 18.6 % — ABNORMAL HIGH (ref 11.5–15.5)
WBC: 8.6 10*3/uL (ref 4.0–10.5)
nRBC: 0.7 % — ABNORMAL HIGH (ref 0.0–0.2)

## 2020-12-26 LAB — GLUCOSE, CAPILLARY
Glucose-Capillary: 123 mg/dL — ABNORMAL HIGH (ref 70–99)
Glucose-Capillary: 138 mg/dL — ABNORMAL HIGH (ref 70–99)
Glucose-Capillary: 163 mg/dL — ABNORMAL HIGH (ref 70–99)

## 2020-12-26 LAB — HEMOGLOBIN AND HEMATOCRIT, BLOOD
HCT: 25.3 % — ABNORMAL LOW (ref 36.0–46.0)
HCT: 25.9 % — ABNORMAL LOW (ref 36.0–46.0)
Hemoglobin: 8.1 g/dL — ABNORMAL LOW (ref 12.0–15.0)
Hemoglobin: 8.2 g/dL — ABNORMAL LOW (ref 12.0–15.0)

## 2020-12-26 LAB — RENAL FUNCTION PANEL
Albumin: 2.2 g/dL — ABNORMAL LOW (ref 3.5–5.0)
Anion gap: 7 (ref 5–15)
BUN: 146 mg/dL — ABNORMAL HIGH (ref 8–23)
CO2: 22 mmol/L (ref 22–32)
Calcium: 7.5 mg/dL — ABNORMAL LOW (ref 8.9–10.3)
Chloride: 112 mmol/L — ABNORMAL HIGH (ref 98–111)
Creatinine, Ser: 3.88 mg/dL — ABNORMAL HIGH (ref 0.44–1.00)
GFR, Estimated: 11 mL/min — ABNORMAL LOW (ref >60)
Glucose, Bld: 136 mg/dL — ABNORMAL HIGH (ref 70–99)
Phosphorus: 4.9 mg/dL — ABNORMAL HIGH (ref 2.5–4.6)
Potassium: 5.4 mmol/L — ABNORMAL HIGH (ref 3.5–5.1)
Sodium: 141 mmol/L (ref 135–145)

## 2020-12-26 LAB — PREPARE RBC (CROSSMATCH)

## 2020-12-26 MED ORDER — SODIUM CHLORIDE 0.9 % IV SOLN: INTRAVENOUS | Status: DC

## 2020-12-26 MED ORDER — SODIUM ZIRCONIUM CYCLOSILICATE 10 G PO PACK
10.0000 g | PACK | Freq: Once | ORAL | Status: AC
  Administered 2020-12-26: 10 g via ORAL
  Filled 2020-12-26: qty 1

## 2020-12-26 MED ORDER — PEG 3350-KCL-NA BICARB-NACL 420 G PO SOLR
4000.0000 mL | Freq: Once | ORAL | Status: AC
  Administered 2020-12-26: 4000 mL via ORAL

## 2020-12-26 MED ORDER — FUROSEMIDE 10 MG/ML IJ SOLN
40.0000 mg | Freq: Once | INTRAMUSCULAR | Status: AC
  Administered 2020-12-26: 40 mg via INTRAVENOUS
  Filled 2020-12-26: qty 4

## 2020-12-26 NOTE — Progress Notes (Signed)
Subjective: Patient doing about the same today.  No further episodes of GI bleeding.  Hemoglobin improved after receiving PRBCs.  Currently 8.  Creatinine about the same.  Patient continues to complain of leg pain.  Objective: Vital signs in last 24 hours: Temp:  [98.1 F (36.7 C)-98.9 F (37.2 C)] 98.2 F (36.8 C) (12/11 0347) Pulse Rate:  [55-76] 61 (12/11 0347) Resp:  [15-20] 18 (12/11 0347) BP: (112-153)/(44-78) 124/46 (12/11 0347) SpO2:  [96 %-100 %] 98 % (12/11 0347) Weight:  [76.2 kg] 76.2 kg (12/11 0500) Last BM Date: 12/25/20 General:   Alert and oriented, pleasant Head:  Normocephalic and atraumatic. Eyes:  No icterus, sclera clear. Conjuctiva pink.  Abdomen:  Bowel sounds present, soft, non-tender, non-distended. No HSM or hernias noted. No rebound or guarding. No masses appreciated  Msk:  Symmetrical without gross deformities. Normal posture. Extremities:  Without clubbing or edema. Neurologic:  Alert and  oriented x4;  grossly normal neurologically. Skin:  Warm and dry, intact without significant lesions.  Cervical Nodes:  No significant cervical adenopathy. Psych:  Alert and cooperative. Normal mood and affect.  Intake/Output from previous day: 12/10 0701 - 12/11 0700 In: 2544.7 [P.O.:637; I.V.:315.7; Blood:1592] Out: 400 [Urine:400] Intake/Output this shift: Total I/O In: -  Out: 150 [Urine:150]  Lab Results: Recent Labs    12/24/20 2302 12/25/20 1151 12/25/20 2213 12/26/20 0736  WBC 5.8 7.9  --  8.6  HGB 4.4* 7.4* 6.7* 8.0*  HCT 14.7* 22.9* 20.5* 24.7*  PLT 225 180  --  182   BMET Recent Labs    12/24/20 2302 12/25/20 1151 12/25/20 1709 12/26/20 0736  NA 136 139  --  141  K 5.7* 5.4* 5.6* 5.4*  CL 107 110  --  112*  CO2 19* 20*  --  22  GLUCOSE 219* 174*  --  136*  BUN 143* 146*  --  146*  CREATININE 4.05* 3.89*  --  3.88*  CALCIUM 7.4* 7.4*  --  7.5*   LFT Recent Labs    12/24/20 2302 12/25/20 1151 12/26/20 0736  PROT 6.0* 5.7*  --    ALBUMIN 2.2* 2.1* 2.2*  AST 22 22  --   ALT 20 17  --   ALKPHOS 68 62  --   BILITOT 0.5 0.9  --    PT/INR Recent Labs    12/25/20 1151  LABPROT 18.6*  INR 1.6*   Hepatitis Panel No results for input(s): HEPBSAG, HCVAB, HEPAIGM, HEPBIGM in the last 72 hours.   Studies/Results: US RENAL  Result Date: 12/26/2020 CLINICAL DATA:  Proteinaceous cyst in the right kidney, seen on prior CT examination EXAM: RENAL / URINARY TRACT ULTRASOUND COMPLETE COMPARISON:  CT examination dated May 03, 2019 FINDINGS: Right Kidney: Renal measurements: 10.0 x 4.3 x 4.5 = volume: 100 mL. Echogenic renal cortical echotexture. Complex avascular cyst in the midpole measuring 1.9 x 2.0 x 2.1 cm. Left Kidney: Not clearly visualized due to bowel gas shadowing. Bladder: Appears normal for degree of bladder distention. Other: None. IMPRESSION: 1. No evidence of right nephrolithiasis or hydronephrosis. Anechoic avascular structure in the midpole of the right kidney, likely cyst. 2.  Left kidney not visualized. 3.  Urinary bladder is unremarkable. Electronically Signed   By: Keane Police D.O.   On: 12/26/2020 11:01   DG Chest Port 1 View  Result Date: 12/25/2020 CLINICAL DATA:  Edema EXAM: PORTABLE CHEST 1 VIEW COMPARISON:  09/02/2020 FINDINGS: Cardiomegaly with mild interstitial edema. Layering small bilateral pleural effusions,  right greater than left, improved. No pneumothorax. Thoracic aortic atherosclerosis. IMPRESSION: Cardiomegaly with mild interstitial edema and small layering bilateral pleural effusions, right greater than left. Electronically Signed   By: Julian Hy M.D.   On: 12/25/2020 01:38    Impression: *Acute GI bleed-source likely lower *Acute blood loss anemia due to above *Acute kidney injury   Plan: Etiology of patient's GI bleed likely lower, differential includes diverticular, hemorrhoidal, AVMs, polyps, malignancy, or other.  Given her history of gastric ulcer bleed in the past,  cannot rule out brisk upper bleed as well given her significantly elevated BUN.   She warrants further evaluation with EGD and colonoscopy.  We will prep colon tonight anticipation of procedures tomorrow.  Discussed with hospitalist.   Continue to monitor H&H and transfuse for less than 7   Continue on  IV Protonix.  Clear liquid diet today.   GI to continue to follow.   Elon Alas. Abbey Chatters, D.O. Gastroenterology and Hepatology [facility]  LOS: 1 day    12/26/2020, 12:23 PM

## 2020-12-26 NOTE — Progress Notes (Signed)
Pt last Hgb 8.1 since lab draw. Pt has started Golytely for procedure EGD/Colonoscopy on 12/27/20. Pt has had two large BM's with dark red blood )1st - moderate amount, 2nd- larger amount with some clots). MD and GI notified. Vitals obtained and stable. Will monitor for s/s of low hgb.

## 2020-12-26 NOTE — Progress Notes (Addendum)
PROGRESS NOTE    [name]  UMP:[id number] DOB: [birth year] DOA: 12/25/2020 PCP: Kendrick Ranch, MD   Brief Narrative: Beth Padilla is a 80 y.o. female, with history of atrial fibrillation, CHF, coronary artery disease, diabetes mellitus type 2, GERD, hyperlipidemia, hypertension, hypothyroidism. Patient presented secondary to hematochezia with concern for GI bleeding. She was found to have profound symptomatic anemia requiring multiple blood transfusions. GI consulted with plan for upper and lower endoscopies.   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 Most recent episode requiring admission in 8 of 2022. Severe associated anemia this admission. Patient reports a history of colonoscopy, but unsure of when. No colonoscopy performed on previous admission. -GI recommendations: plan for upper endoscopy/colonoscopy on 12/12 -Trend CBC/H&H   Acute anemia Chronic anemia Symptomatic anemia Previous baseline hemoglobin of 8.6 in 08/2020. Most recent iron panel with normal ferritin and undetectable iron but with concurrent low TIBC. Chronic anemia likely from GI loss but iron panel suggests possible kidney disease component. Acute in setting of hematochezia as mentioned above. 3 units of PRBC ordered on admission. Patient has improved symptomatically. Required another 1 unit of PRBC on 12/10. -Continue blood transfusions as needed for hemoglobin <7   AKI on CKD stage IV Repeat metabolic panel is pending. No hypotension noted. Patient with some BLE edema and mild pulmonary edema on chest x-ray; on room air. Started on sodium bicarbonate for associated mild  metabolic acidosis. Some improvement but mostly stable. Renal ultrasound without obstruction. -Lasix 40 mg IV x1 -Daily RFP   Pulmonary edema Diastolic heart failure Mild. Patient is on Lasix as an outpatient. Last EF of 55-60% from 07/2020. Lasix held secondary to severe anemia. -Lasix as mentioned above   Hyperkalemia Lokelma given. Awaiting CMP this morning.  Hypocalcemia Mild. Corrected calcium of 8.9.  Depression -Continue Lexapro and Buspar  Atrial fibrillation On Eliquis as an outpatient. Not on rate control medication. Eliquis held secondary to GI bleeding  Hyperkalemia Secondary to AKI. -Lokelma 10 g x1   DVT prophylaxis: SCDs Code Status:   Code Status: Full Code Family Communication: Daughter at bedside Disposition Plan: Discharge home likely in 2-3 days pending GI management, stability of hemoglobin   Consultants:  Gastroenterology  Procedures:  None  Antimicrobials: None    Subjective: Thirsty. No other concerns  Objective: Vitals:   12/26/20 0110 12/26/20 0113 12/26/20 0347 12/26/20 0500  BP: (!) 126/48 (!) 126/48 (!) 124/46   Pulse: 63 63 61   Resp: 18 18 18    Temp: 98.6 F (37 C) 98.6 F (37 C) 98.2 F (36.8 C)   TempSrc:      SpO2:  99% 98%   Weight:    76.2 kg  Height:        Intake/Output Summary (Last 24 hours) at 12/26/2020 1000 Last data filed at 12/26/2020 0347 Gross per 24 hour  Intake 1897.7 ml  Output 400 ml  Net 1497.7 ml   Filed Weights   12/24/20 2212 12/26/20 0500  Weight: 74.4 kg 76.2 kg    Examination:  General exam: Appears calm and comfortable Respiratory system: Clear to auscultation but diminished. Respiratory effort normal. Cardiovascular system: S1 & S2  heard, RRR. 2/6 systolic murmur Gastrointestinal system: Abdomen is nondistended, soft and nontender. No organomegaly or masses felt. Normal bowel sounds heard. Central nervous system: Alert and oriented. No focal neurological  deficits. Musculoskeletal: bilateral upper/lower extremity edema. No calf tenderness Skin: No cyanosis. No rashes Psychiatry: Judgement and insight appear normal. Mood & affect appropriate.     Data Reviewed: I have personally reviewed following labs and imaging studies  CBC Lab Results  Component Value Date   WBC 8.6 12/26/2020   RBC 2.88 (L) 12/26/2020   HGB 8.0 (L) 12/26/2020   HCT 24.7 (L) 12/26/2020   MCV 85.8 12/26/2020   MCH 27.8 12/26/2020   PLT 182 12/26/2020   MCHC 32.4 12/26/2020   RDW 18.6 (H) 12/26/2020   LYMPHSABS 0.6 (L) 12/25/2020   MONOABS 0.5 12/25/2020   EOSABS 0.0 12/25/2020   BASOSABS 0.0 88/50/2774     Last metabolic panel Lab Results  Component Value Date   NA 141 12/26/2020   K 5.4 (H) 12/26/2020   CL 112 (H) 12/26/2020   CO2 22 12/26/2020   BUN 146 (H) 12/26/2020   CREATININE 3.88 (H) 12/26/2020   GLUCOSE 136 (H) 12/26/2020   GFRNONAA 11 (L) 12/26/2020   GFRAA 23 (L) 05/04/2019   CALCIUM 7.5 (L) 12/26/2020   PHOS 4.9 (H) 12/26/2020   PROT 5.7 (L) 12/25/2020   ALBUMIN 2.2 (L) 12/26/2020   LABGLOB 3.0 07/25/2020   AGRATIO 0.9 07/25/2020   BILITOT 0.9 12/25/2020   ALKPHOS 62 12/25/2020   AST 22 12/25/2020   ALT 17 12/25/2020   ANIONGAP 7 12/26/2020    CBG (last 3)  Recent Labs    12/26/20 0736  GLUCAP 123*     GFR: Estimated Creatinine Clearance: 12.1 mL/min (A) (by C-G formula based on SCr of 3.88 mg/dL (H)).  Coagulation Profile: Recent Labs  Lab 12/25/20 1151  INR 1.6*    Recent Results (from the past 240 hour(s))  Resp Panel by RT-PCR (Flu A&B, Covid) Nasopharyngeal Swab     Status: None   Collection Time: 12/25/20 12:10 AM   Specimen: Nasopharyngeal Swab; Nasopharyngeal(NP) swabs in vial transport medium  Result Value Ref Range Status   SARS Coronavirus 2 by RT PCR NEGATIVE NEGATIVE Final    Comment: (NOTE) SARS-CoV-2 target nucleic acids are NOT DETECTED.  The SARS-CoV-2 RNA is generally detectable in upper  respiratory specimens during the acute phase of infection. The lowest concentration of SARS-CoV-2 viral copies this assay can detect is 138 copies/mL. A negative result does not preclude SARS-Cov-2 infection and should not be used as the sole basis for treatment or other patient management decisions. A negative result may occur with  improper specimen collection/handling, submission of specimen other than nasopharyngeal swab, presence of viral mutation(s) within the areas targeted by this assay, and inadequate number of viral copies(<138 copies/mL). A negative result must be combined with clinical observations, patient history, and epidemiological information. The expected result is Negative.  Fact Sheet for Patients:  EntrepreneurPulse.com.au  Fact Sheet for Healthcare Providers:  IncredibleEmployment.be  This test is no t yet approved or cleared by the [place] FDA and  has been authorized for detection and/or diagnosis of SARS-CoV-2 by FDA under an Emergency Use Authorization (EUA). This EUA will remain  in effect (meaning this test can be used) for the duration of the COVID-19 declaration under Section 564(b)(1) of the Act, 21 U.S.C.section 360bbb-3(b)(1), unless the authorization is terminated  or revoked sooner.       Influenza  A by PCR NEGATIVE NEGATIVE Final   Influenza B by PCR NEGATIVE NEGATIVE Final    Comment: (NOTE) The Xpert Xpress SARS-CoV-2/FLU/RSV plus assay is intended as an aid in the diagnosis of influenza from Nasopharyngeal swab specimens and should not be used as a sole basis for treatment. Nasal washings and aspirates are unacceptable for Xpert Xpress SARS-CoV-2/FLU/RSV testing.  Fact Sheet for Patients: EntrepreneurPulse.com.au  Fact Sheet for Healthcare Providers: IncredibleEmployment.be  This test is not yet approved or cleared by the [place] FDA and has been  authorized for detection and/or diagnosis of SARS-CoV-2 by FDA under an Emergency Use Authorization (EUA). This EUA will remain in effect (meaning this test can be used) for the duration of the COVID-19 declaration under Section 564(b)(1) of the Act, 21 U.S.C. section 360bbb-3(b)(1), unless the authorization is terminated or revoked.  Performed at [facility], [address] [place], [place] [address]         Radiology Studies: [facility] 1 View  Result Date: 12/25/2020 CLINICAL DATA:  Edema EXAM: PORTABLE CHEST 1 VIEW COMPARISON:  09/02/2020 FINDINGS: Cardiomegaly with mild interstitial edema. Layering small bilateral pleural effusions, right greater than left, improved. No pneumothorax. Thoracic aortic atherosclerosis. IMPRESSION: Cardiomegaly with mild interstitial edema and small layering bilateral pleural effusions, right greater than left. Electronically Signed   By: Julian Hy M.D.   On: 12/25/2020 01:38        Scheduled Meds:  atorvastatin  80 mg Oral QPM   busPIRone  10 mg Oral BID   escitalopram  10 mg Oral Daily   ferrous sulfate  325 mg Oral BID   insulin aspart  0-9 Units Subcutaneous TID WC   isosorbide dinitrate  30 mg Oral TID   levothyroxine  125 mcg Oral Daily   [START ON 12/28/2020] pantoprazole  40 mg Intravenous Q12H   sodium bicarbonate  1,300 mg Oral BID   Continuous Infusions:  pantoprazole 8 mg/hr (12/25/20 2344)     LOS: 1 day     Cordelia Poche, MD [facility] 12/26/2020, 10:00 AM  If 7PM-7AM, please contact night-coverage [email or web address]

## 2020-12-26 NOTE — Progress Notes (Signed)
Pt has been drinking bowel prep for procedures scheduled for 12/27/20. Pt cleaned up twice; one moderate BM and one small. Both are dark red in color with clots. Drs. Netty and [employer]. Dr. Abbey Chatters ordered CBC and BMP for a.m.  Also notified that if patient coughs or turns to side, liquid stool, dark red in color, just runs out of rectum. Will continue to monitor patient throughout shift for changes and update MD/GI

## 2020-12-26 NOTE — Progress Notes (Signed)
MD ordered 1 unit  of PRBC to transfuse for hemoglobin 6.7.

## 2020-12-27 ENCOUNTER — Inpatient Hospital Stay (HOSPITAL_COMMUNITY): Payer: Medicare Other

## 2020-12-27 ENCOUNTER — Inpatient Hospital Stay (HOSPITAL_COMMUNITY): Payer: Medicare Other | Admitting: Anesthesiology

## 2020-12-27 ENCOUNTER — Encounter (HOSPITAL_COMMUNITY): Payer: Self-pay | Admitting: Family Medicine

## 2020-12-27 ENCOUNTER — Encounter (HOSPITAL_COMMUNITY)
Admission: EM | Disposition: A | Discharge status: Hospice | Payer: Self-pay | Source: Home / Self Care | Attending: Family Medicine

## 2020-12-27 ENCOUNTER — Other Ambulatory Visit: Payer: Self-pay

## 2020-12-27 DIAGNOSIS — C182 Malignant neoplasm of ascending colon: Principal | ICD-10-CM

## 2020-12-27 DIAGNOSIS — K921 Melena: Secondary | ICD-10-CM

## 2020-12-27 HISTORY — PX: BIOPSY: SHX5522

## 2020-12-27 HISTORY — PX: COLONOSCOPY WITH PROPOFOL: SHX5780

## 2020-12-27 LAB — RENAL FUNCTION PANEL
Albumin: 2.4 g/dL — ABNORMAL LOW (ref 3.5–5.0)
Anion gap: 7 (ref 5–15)
BUN: 134 mg/dL — ABNORMAL HIGH (ref 8–23)
CO2: 22 mmol/L (ref 22–32)
Calcium: 7.7 mg/dL — ABNORMAL LOW (ref 8.9–10.3)
Chloride: 111 mmol/L (ref 98–111)
Creatinine, Ser: 3.72 mg/dL — ABNORMAL HIGH (ref 0.44–1.00)
GFR, Estimated: 12 mL/min — ABNORMAL LOW (ref >60)
Glucose, Bld: 142 mg/dL — ABNORMAL HIGH (ref 70–99)
Phosphorus: 4.5 mg/dL (ref 2.5–4.6)
Potassium: 5.1 mmol/L (ref 3.5–5.1)
Sodium: 140 mmol/L (ref 135–145)

## 2020-12-27 LAB — CBC
HCT: 28.4 % — ABNORMAL LOW (ref 36.0–46.0)
Hemoglobin: 8.8 g/dL — ABNORMAL LOW (ref 12.0–15.0)
MCH: 27 pg (ref 26.0–34.0)
MCHC: 31 g/dL (ref 30.0–36.0)
MCV: 87.1 fL (ref 80.0–100.0)
Platelets: 255 10*3/uL (ref 150–400)
RBC: 3.26 MIL/uL — ABNORMAL LOW (ref 3.87–5.11)
RDW: 19.5 % — ABNORMAL HIGH (ref 11.5–15.5)
WBC: 11 10*3/uL — ABNORMAL HIGH (ref 4.0–10.5)
nRBC: 0.4 % — ABNORMAL HIGH (ref 0.0–0.2)

## 2020-12-27 LAB — HEMOGLOBIN AND HEMATOCRIT, BLOOD
HCT: 25.9 % — ABNORMAL LOW (ref 36.0–46.0)
Hemoglobin: 8.1 g/dL — ABNORMAL LOW (ref 12.0–15.0)

## 2020-12-27 LAB — GLUCOSE, CAPILLARY
Glucose-Capillary: 143 mg/dL — ABNORMAL HIGH (ref 70–99)
Glucose-Capillary: 135 mg/dL — ABNORMAL HIGH (ref 70–99)
Glucose-Capillary: 129 mg/dL — ABNORMAL HIGH (ref 70–99)
Glucose-Capillary: 121 mg/dL — ABNORMAL HIGH (ref 70–99)
Glucose-Capillary: 110 mg/dL — ABNORMAL HIGH (ref 70–99)

## 2020-12-27 SURGERY — COLONOSCOPY WITH PROPOFOL
Anesthesia: General

## 2020-12-27 MED ORDER — GUAIFENESIN 100 MG/5ML PO LIQD: 5.0000 mL | ORAL | Status: DC | PRN

## 2020-12-27 MED ORDER — ETOMIDATE 2 MG/ML IV SOLN
INTRAVENOUS | Status: DC | PRN
  Administered 2020-12-27: 2 mg via INTRAVENOUS

## 2020-12-27 MED ORDER — FUROSEMIDE 10 MG/ML IJ SOLN
40.0000 mg | Freq: Every day | INTRAMUSCULAR | Status: DC
  Administered 2020-12-27 – 2020-12-28 (×2): 40 mg via INTRAVENOUS
  Filled 2020-12-27 (×2): qty 4

## 2020-12-27 MED ORDER — PROPOFOL 10 MG/ML IV BOLUS
INTRAVENOUS | Status: DC | PRN
  Administered 2020-12-27 (×2): 20 mg via INTRAVENOUS
  Administered 2020-12-27: 30 mg via INTRAVENOUS

## 2020-12-27 NOTE — Op Note (Signed)
[facility] Patient Name: Beth Padilla Procedure Date: 12/27/2020 10:55 AM MRN: [id number] Date of Birth: 1941/01/09 Attending MD: Elon Alas. Abbey Chatters DO CSN: [id number] Age: 80 Admit Type: Inpatient Procedure:                Colonoscopy Indications:              Hematochezia, Acute post hemorrhagic anemia Providers:                Elon Alas. Abbey Chatters, DO, Lambert Mody, Dereck Leep, Technician Referring MD:              Medicines:                See the Anesthesia note for documentation of the                            administered medications Complications:            No immediate complications. Estimated Blood Loss:     Estimated blood loss was minimal. Procedure:                Pre-Anesthesia Assessment:                           - The anesthesia plan was to use monitored                            anesthesia care (MAC).                           After obtaining informed consent, the colonoscope                            was passed under direct vision. Throughout the                            procedure, the patient's blood pressure, pulse, and                            oxygen saturations were monitored continuously. The                            [id number]-HQ190L ([id number]) scope was introduced through                            the anus and advanced to the the cecum, identified                            by appendiceal orifice and ileocecal valve. The                            colonoscopy was performed without difficulty. The                            patient tolerated the procedure well.  The quality                            of the bowel preparation was evaluated using the                            BBPS [facility] Bowel Preparation Scale) with scores                            of: Right Colon = 2 (minor amount of residual                            staining, small fragments of stool and/or opaque                            liquid,  but mucosa seen well), Transverse Colon = 2                            (minor amount of residual staining, small fragments                            of stool and/or opaque liquid, but mucosa seen                            well) and Left Colon = 2 (minor amount of residual                            staining, small fragments of stool and/or opaque                            liquid, but mucosa seen well). The total BBPS score                            equals 6. The quality of the bowel preparation was                            fair. Scope In: 11:13:27 AM Scope Out: 11:30:28 AM Scope Withdrawal Time: 0 hours 5 minutes 58 seconds  Total Procedure Duration: 0 hours 17 minutes 1 second  Findings:      The perianal and digital rectal examinations were normal.      Non-bleeding internal hemorrhoids were found during endoscopy.      Multiple small and large-mouthed diverticula were found in the sigmoid       colon and descending colon.      Hematin (altered blood/coffee-ground-like material) was found in the       entire colon.      A frond-like/villous, fungating, infiltrative, polypoid and submucosal       non-obstructing mass was found in the ascending colon and in the       proximal ascending colon. The mass was partially circumferential       (involving one-third of the lumen circumference). No bleeding was       present. Biopsies were taken with a cold forceps for histology. Impression:               -  Preparation of the colon was fair.                           - Non-bleeding internal hemorrhoids.                           - Diverticulosis in the sigmoid colon and in the                            descending colon.                           - Blood in the entire examined colon.                           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 Biopsied. Moderate Sedation:      Per Anesthesia Care Recommendation:            - Return patient to hospital ward for ongoing care.                           - Advance diet as tolerated.                           - Patient with large mass in her proximal ascending                            colon consistent with malignancy. I took extensive                            biopsies of this. Discussed case with surgery,                            given her comorbidities she would not be a                            candidate for surgery at least at our facility. We                            will arrange outpatient follow-up with oncology as                            well as staging CT scans. She can go on regular                            diet today. I would continue to hold her Eliquis as                            she is high risk for bleeding from this mass. Procedure Code(s):        --- Professional ---  45380, Colonoscopy, flexible; with biopsy, single                            or multiple Diagnosis Code(s):        --- Professional ---                           K64.8, Other hemorrhoids                           K92.2, Gastrointestinal hemorrhage, unspecified                           C18.2, Malignant neoplasm of ascending colon                           K92.1, Melena (includes Hematochezia)                           D62, Acute posthemorrhagic anemia                           K57.30, Diverticulosis of large intestine without                            perforation or abscess without bleeding CPT copyright 2019 American Medical Association. All rights reserved. The codes documented in this report are preliminary and upon coder review may  be revised to meet current compliance requirements. Elon Alas. Abbey Chatters, DO [place] Abbey Chatters, DO 12/27/2020 11:39:00 AM This report has been signed electronically. Number of Addenda: 0

## 2020-12-27 NOTE — Progress Notes (Addendum)
PROGRESS NOTE    Beth Padilla  TIW:[id number] DOB: 1940/11/26 DOA: 12/25/2020 PCP: Kendrick Ranch, MD   Brief Narrative: Beth Padilla is a 80 y.o. female, with history of atrial fibrillation, CHF, coronary artery disease, diabetes mellitus type 2, GERD, hyperlipidemia, hypertension, hypothyroidism. Patient presented secondary to hematochezia with concern for GI bleeding. She was found to have profound symptomatic anemia requiring multiple blood transfusions. GI consulted with plan for upper and lower endoscopies.   Assessment & Plan:   Principal Problem:   GI bleed Active Problems:   HTN (hypertension)   Hypothyroidism   AKI (acute kidney injury) ([facility])   Paroxysmal atrial fibrillation (HCC)   Blood loss anemia   Hypocalcemia   Severe protein-calorie malnutrition Beth Padilla: less than 60% of standard weight) (HCC)   Hematochezia GI Bleeding Recurrent issue. Most recent episode requiring admission in 8 of 2022. Severe associated anemia this admission. Patient reports a history of colonoscopy, but unsure of when. No colonoscopy performed on previous admission.  Hematochezia with bowel prep. -GI recommendations: plan for upper endoscopy/colonoscopy today, 12/12. Patient completed half of her bowel prep -Trend CBC/H&H   Acute anemia Chronic anemia Symptomatic anemia Previous baseline hemoglobin of 8.6 in 08/2020. Most recent iron panel with normal ferritin and undetectable iron but with concurrent low TIBC. Chronic anemia likely from GI loss but iron panel suggests possible kidney disease component. Acute in setting of hematochezia as mentioned above. 3 units of PRBC ordered on admission. Patient has improved symptomatically. Required another 1 unit of PRBC on 12/10. Hemoglobin now stable. -Continue blood transfusions as needed for hemoglobin <7   AKI on CKD stage IV Repeat metabolic panel is pending. No hypotension noted. Patient with some BLE edema and mild  pulmonary edema on chest x-ray; on room air. Started on sodium bicarbonate for associated mild metabolic acidosis. Some improvement but mostly stable. Renal ultrasound without obstruction. -Lasix 40 mg IV daily -Continue sodium bicarbonate -Daily RFP   Pulmonary edema Diastolic heart failure Mild. Patient is on Lasix as an outpatient. Last EF of 55-60% from 07/2020. Lasix held secondary to severe anemia. -Lasix as mentioned above   Hyperkalemia Lokelma given. Resolved at this time. Likely secondary to AKI  Hypocalcemia Mild. Corrected calcium of 8.9 on 12/11.  Depression -Continue Lexapro and Buspar  Atrial fibrillation On Eliquis as an outpatient. Not on rate control medication. Eliquis held secondary to GI bleeding   DVT prophylaxis: SCDs Code Status:   Code Status: Full Code Family Communication: None at bedside Disposition Plan: Discharge home likely in 1-3 days pending GI management, stability of hemoglobin   Consultants:  Gastroenterology  Procedures:  None  Antimicrobials: None    Subjective: No concerns this morning.  Objective: Vitals:   12/26/20 1259 12/26/20 1854 12/26/20 2108 12/27/20 0433  BP: 127/61 135/62 (!) 113/35 (!) 148/57  Pulse: 73 66 70 66  Resp: 20 20 20 18   Temp: 98.7 F (37.1 C) 97.8 F (36.6 C) 97.6 F (36.4 C) 98.8 F (37.1 C)  TempSrc: Oral Oral Oral   SpO2: 98% 98% 98% 94%  Weight:    94.8 kg  Height:        Intake/Output Summary (Last 24 hours) at 12/27/2020 0806 Last data filed at 12/26/2020 1500 Gross per 24 hour  Intake 305.5 ml  Output 150 ml  Net 155.5 ml    Filed Weights   12/24/20 2212 12/26/20 0500 12/27/20 0433  Weight: 74.4 kg 76.2 kg 94.8 kg    Examination:  General exam: Appears calm and comfortable Respiratory system: mild end expiratory wheeze. Respiratory effort normal. Cardiovascular system: S1 & S2 heard, RRR. No murmurs, rubs, gallops or clicks. Gastrointestinal system: Abdomen is nondistended,  soft and nontender. No organomegaly or masses felt. Normal bowel sounds heard. Central nervous system: Alert and oriented. No focal neurological deficits. Musculoskeletal: 2+ pitting edema of thighs and edema of right hand. No calf tenderness Skin: No cyanosis. No rashes Psychiatry: Judgement and insight appear normal. Mood & affect appropriate.     Data Reviewed: I have personally reviewed following labs and imaging studies  CBC Lab Results  Component Value Date   WBC 11.0 (H) 12/27/2020   RBC 3.26 (L) 12/27/2020   HGB 8.8 (L) 12/27/2020   HCT 28.4 (L) 12/27/2020   MCV 87.1 12/27/2020   MCH 27.0 12/27/2020   PLT 255 12/27/2020   MCHC 31.0 12/27/2020   RDW 19.5 (H) 12/27/2020   LYMPHSABS 0.6 (L) 12/25/2020   MONOABS 0.5 12/25/2020   EOSABS 0.0 12/25/2020   BASOSABS 0.0 94/85/4627     Last metabolic panel Lab Results  Component Value Date   NA 140 12/27/2020   K 5.1 12/27/2020   CL 111 12/27/2020   CO2 22 12/27/2020   BUN 134 (H) 12/27/2020   CREATININE 3.72 (H) 12/27/2020   GLUCOSE 142 (H) 12/27/2020   GFRNONAA 12 (L) 12/27/2020   GFRAA 23 (L) 05/04/2019   CALCIUM 7.7 (L) 12/27/2020   PHOS 4.5 12/27/2020   PROT 5.7 (L) 12/25/2020   ALBUMIN 2.4 (L) 12/27/2020   LABGLOB 3.0 07/25/2020   AGRATIO 0.9 07/25/2020   BILITOT 0.9 12/25/2020   ALKPHOS 62 12/25/2020   AST 22 12/25/2020   ALT 17 12/25/2020   ANIONGAP 7 12/27/2020    CBG (last 3)  Recent Labs    12/26/20 1116 12/26/20 1610 12/27/20 0714  GLUCAP 138* 163* 135*      GFR: Estimated Creatinine Clearance: 14 mL/min (A) (by C-G formula based on SCr of 3.72 mg/dL (H)).  Coagulation Profile: Recent Labs  Lab 12/25/20 1151  INR 1.6*     Recent Results (from the past 240 hour(s))  Resp Panel by RT-PCR (Flu A&B, Covid) Nasopharyngeal Swab     Status: None   Collection Time: 12/25/20 12:10 AM   Specimen: Nasopharyngeal Swab; Nasopharyngeal(NP) swabs in vial transport medium  Result Value Ref  Range Status   SARS Coronavirus 2 by RT PCR NEGATIVE NEGATIVE Final    Comment: (NOTE) SARS-CoV-2 target nucleic acids are NOT DETECTED.  The SARS-CoV-2 RNA is generally detectable in upper respiratory specimens during the acute phase of infection. The lowest concentration of SARS-CoV-2 viral copies this assay can detect is 138 copies/mL. A negative result does not preclude SARS-Cov-2 infection and should not be used as the sole basis for treatment or other patient management decisions. A negative result may occur with  improper specimen collection/handling, submission of specimen other than nasopharyngeal swab, presence of viral mutation(s) within the areas targeted by this assay, and inadequate number of viral copies(<138 copies/mL). A negative result must be combined with clinical observations, patient history, and epidemiological information. The expected result is Negative.  Fact Sheet for Patients:  EntrepreneurPulse.com.au  Fact Sheet for Healthcare Providers:  IncredibleEmployment.be  This test is no t yet approved or cleared by the [place] FDA and  has been authorized for detection and/or diagnosis of SARS-CoV-2 by FDA under an Emergency Use Authorization (EUA). This EUA will remain  in effect (meaning this test  can be used) for the duration of the COVID-19 declaration under Section 564(b)(1) of the Act, 21 U.S.C.section 360bbb-3(b)(1), unless the authorization is terminated  or revoked sooner.       Influenza A by PCR NEGATIVE NEGATIVE Final   Influenza B by PCR NEGATIVE NEGATIVE Final    Comment: (NOTE) The Xpert Xpress SARS-CoV-2/FLU/RSV plus assay is intended as an aid in the diagnosis of influenza from Nasopharyngeal swab specimens and should not be used as a sole basis for treatment. Nasal washings and aspirates are unacceptable for Xpert Xpress SARS-CoV-2/FLU/RSV testing.  Fact Sheet for  Patients: EntrepreneurPulse.com.au  Fact Sheet for Healthcare Providers: IncredibleEmployment.be  This test is not yet approved or cleared by the [place] FDA and has been authorized for detection and/or diagnosis of SARS-CoV-2 by FDA under an Emergency Use Authorization (EUA). This EUA will remain in effect (meaning this test can be used) for the duration of the COVID-19 declaration under Section 564(b)(1) of the Act, 21 U.S.C. section 360bbb-3(b)(1), unless the authorization is terminated or revoked.  Performed at [facility], [address], [place], [place] [address]          Radiology Studies: US RENAL  Result Date: 12/26/2020 CLINICAL DATA:  Proteinaceous cyst in the right kidney, seen on prior CT examination EXAM: RENAL / URINARY TRACT ULTRASOUND COMPLETE COMPARISON:  CT examination dated May 03, 2019 FINDINGS: Right Kidney: Renal measurements: 10.0 x 4.3 x 4.5 = volume: 100 mL. Echogenic renal cortical echotexture. Complex avascular cyst in the midpole measuring 1.9 x 2.0 x 2.1 cm. Left Kidney: Not clearly visualized due to bowel gas shadowing. Bladder: Appears normal for degree of bladder distention. Other: None. IMPRESSION: 1. No evidence of right nephrolithiasis or hydronephrosis. Anechoic avascular structure in the midpole of the right kidney, likely cyst. 2.  Left kidney not visualized. 3.  Urinary bladder is unremarkable. Electronically Signed   By: Keane Police D.O.   On: 12/26/2020 11:01   ECHOCARDIOGRAM COMPLETE  Result Date: 12/26/2020    ECHOCARDIOGRAM REPORT   Patient Name:   JOYLYN DUGGIN Date of Exam: 12/26/2020 Medical Rec #:  [id number]          Height:       67.0 in Accession #:    [id number]         Weight:       168.0 lb Date of Birth:  Jul 25, 1940          BSA:          1.878 m Patient Age:    77 years           BP:           170/62 mmHg Patient Gender: F                  HR:           62 bpm. Exam Location:   [facility] Procedure: 2D Echo, Color Doppler and Cardiac Doppler Indications:    CHF  History:        Patient has prior history of Echocardiogram examinations, most                 recent 06/22/2020. Stroke, Arrythmias:Atrial Fibrillation; Risk                 Factors:Diabetes, Dyslipidemia and Non-Smoker.  Sonographer:    Leavy Cella RDCS Referring Phys: [id number] ASIA B [place]  1. Left ventricular ejection fraction, by estimation, is 60 to  65%. The left ventricle has normal function. The left ventricle has no regional wall motion abnormalities. Left ventricular diastolic function could not be evaluated. There is the interventricular septum is flattened in systole and diastole, consistent with right ventricular pressure and volume overload.  2. Right ventricular systolic function is mildly reduced. The right ventricular size is mildly enlarged. There is severely elevated pulmonary artery systolic pressure. The estimated right ventricular systolic pressure is 37.9 mmHg.  3. Left atrial size was severely dilated.  4. Right atrial size was mild to moderately dilated.  5. The mitral valve is degenerative. Mild mitral valve regurgitation. No evidence of mitral stenosis.  6. Tricuspid valve regurgitation is mild to moderate.  7. The aortic valve is tricuspid. There is moderate calcification of the aortic valve. There is moderate thickening of the aortic valve. Aortic valve regurgitation is mild. Moderate aortic valve stenosis. Aortic valve area, by VTI measures 1.07 cm. Aortic valve mean gradient measures 24.0 mmHg. Aortic valve Vmax measures 2.96 m/s.  8. The inferior vena cava is dilated in size with <50% respiratory variability, suggesting right atrial pressure of 15 mmHg. Comparison(s): Changes from prior study are noted. RVSP is now severely elevated. Moderate AS remains. LV function unchanged. FINDINGS  Left Ventricle: Left ventricular ejection fraction, by estimation, is 60 to 65%. The left  ventricle has normal function. The left ventricle has no regional wall motion abnormalities. The left ventricular internal cavity size was normal in size. There is  no left ventricular hypertrophy. The interventricular septum is flattened in systole and diastole, consistent with right ventricular pressure and volume overload. Left ventricular diastolic function could not be evaluated due to atrial fibrillation. Left ventricular diastolic function could not be evaluated. Right Ventricle: The right ventricular size is mildly enlarged. No increase in right ventricular wall thickness. Right ventricular systolic function is mildly reduced. There is severely elevated pulmonary artery systolic pressure. The tricuspid regurgitant velocity is 3.78 m/s, and with an assumed right atrial pressure of 15 mmHg, the estimated right ventricular systolic pressure is 02.4 mmHg. Left Atrium: Left atrial size was severely dilated. Right Atrium: Right atrial size was mild to moderately dilated. Pericardium: There is no evidence of pericardial effusion. Mitral Valve: The mitral valve is degenerative in appearance. Mild mitral valve regurgitation. No evidence of mitral valve stenosis. Tricuspid Valve: The tricuspid valve is grossly normal. Tricuspid valve regurgitation is mild to moderate. No evidence of tricuspid stenosis. Aortic Valve: The aortic valve is tricuspid. There is moderate calcification of the aortic valve. There is moderate thickening of the aortic valve. Aortic valve regurgitation is mild. Aortic regurgitation PHT measures 617 msec. Moderate aortic stenosis is present. Aortic valve mean gradient measures 24.0 mmHg. Aortic valve peak gradient measures 35.0 mmHg. Aortic valve area, by VTI measures 1.07 cm. Pulmonic Valve: The pulmonic valve was grossly normal. Pulmonic valve regurgitation is not visualized. No evidence of pulmonic stenosis. Aorta: The aortic root and ascending aorta are structurally normal, with no evidence  of dilitation. Venous: The inferior vena cava is dilated in size with less than 50% respiratory variability, suggesting right atrial pressure of 15 mmHg. IAS/Shunts: The atrial septum is grossly normal.  LEFT VENTRICLE PLAX 2D LVIDd:         4.20 cm   Diastology LVIDs:         2.70 cm   LV e' medial:    4.35 cm/s LV PW:         1.50 cm   LV E/e' medial:  34.0 LV IVS:        0.90 cm   LV e' lateral:   5.70 cm/s LVOT diam:     1.98 cm   LV E/e' lateral: 26.0 LV SV:         83 LV SV Index:   44 LVOT Area:     3.08 cm  RIGHT VENTRICLE RV S prime:     6.90 cm/s TAPSE (M-mode): 1.6 cm LEFT ATRIUM              Index        RIGHT ATRIUM           Index LA diam:        4.20 cm  2.24 cm/m   RA Area:     19.60 cm LA Vol (A2C):   84.3 ml  44.89 ml/m  RA Volume:   65.90 ml  35.09 ml/m LA Vol (A4C):   101.0 ml 53.79 ml/m LA Biplane Vol: 93.6 ml  49.85 ml/m  AORTIC VALVE AV Area (Vmax):    1.15 cm AV Area (Vmean):   1.01 cm AV Area (VTI):     1.07 cm AV Vmax:           296.00 cm/s AV Vmean:          234.000 cm/s AV VTI:            0.779 m AV Peak Grad:      35.0 mmHg AV Mean Grad:      24.0 mmHg LVOT Vmax:         111.00 cm/s LVOT Vmean:        76.900 cm/s LVOT VTI:          0.270 m LVOT/AV VTI ratio: 0.35 AI PHT:            617 msec AR Vena Contracta: 0.50 cm  AORTA Ao Root diam: 2.60 cm Ao Asc diam:  3.30 cm MITRAL VALVE                TRICUSPID VALVE MV Area (PHT): 3.99 cm     TR Peak grad:   57.2 mmHg MV Decel Time: 190 msec     TR Vmax:        378.00 cm/s MR Peak grad: 96.4 mmHg MR Vmax:      491.00 cm/s   SHUNTS MV E velocity: 148.00 cm/s  Systemic VTI:  0.27 m MV A velocity: 49.60 cm/s   Systemic Diam: 1.98 cm MV E/A ratio:  2.98 Eleonore Chiquito MD Electronically signed by Eleonore Chiquito MD Signature Date/Time: 12/26/2020/2:02:33 PM    Final         Scheduled Meds:  atorvastatin  80 mg Oral QPM   busPIRone  10 mg Oral BID   escitalopram  10 mg Oral Daily   ferrous sulfate  325 mg Oral BID   furosemide  40  mg Intravenous Daily   insulin aspart  0-9 Units Subcutaneous TID WC   isosorbide dinitrate  30 mg Oral TID   levothyroxine  125 mcg Oral Daily   [START ON 12/28/2020] pantoprazole  40 mg Intravenous Q12H   sodium bicarbonate  1,300 mg Oral BID   Continuous Infusions:  sodium chloride 20 mL/hr at 12/26/20 1338   pantoprazole 8 mg/hr (12/27/20 0546)     LOS: 2 days     Cordelia Poche, MD [facility] 12/27/2020, 8:06 AM  If 7PM-7AM, please contact night-coverage [email or web address]

## 2020-12-27 NOTE — Progress Notes (Addendum)
Initial Nutrition Assessment  DOCUMENTATION CODES:   Obesity unspecified  INTERVENTION:  RD will follow diet advancement and add ONS as appropriate  Regular diet planned per MD note  NUTRITION DIAGNOSIS:   Inadequate oral intake related to inability to eat as evidenced by NPO status.   GOAL:  Patient will meet greater than or equal to 90% of their needs (if feasible)  MONITOR:  Diet advancement, PO intake, Labs, Supplement acceptance  REASON FOR ASSESSMENT:   Consult Assessment of nutrition requirement/status  ASSESSMENT: Patient is an obese 80 yo female with hx of CHF, CAD, DM2, Chronic anemia, CKD IV, GERD and HTN. She presents with acute GI bleed and AKI.  Currently NPO. Patient has returned to room following colonoscopy but is still not awake following her procedure. Diverticulosis and malignant tumor (biopsied). Large mass in proximal ascending colon.   Talked with nursing who is waiting to hear from MD regarding diet advancement. Patient ate 100% a meal yesterday. Per MD note diet will be regular.   Review of weights: severe gain of 17.7 kg (23%) x 4 months. Deep pitting edema per nursing.  Estimated needs based on Adjusted IBW-76 kg.   08/18/20-77.1 kg 09/05/20- 86.3 kg  12/27/20- 94.8 kg  Medications include: insulin, protonix, ferrous sulfate, lasix, lexapro, lipitor  Labs: BMP Latest Ref Rng & Units 12/27/2020 12/26/2020 12/25/2020  Glucose 70 - 99 mg/dL 142(H) 136(H) -  BUN 8 - 23 mg/dL 134(H) 146(H) -  Creatinine 0.44 - 1.00 mg/dL 3.72(H) 3.88(H) -  Sodium 135 - 145 mmol/L 140 141 -  Potassium 3.5 - 5.1 mmol/L 5.1 5.4(H) 5.6(H)  Chloride 98 - 111 mmol/L 111 112(H) -  CO2 22 - 32 mmol/L 22 22 -  Calcium 8.9 - 10.3 mg/dL 7.7(L) 7.5(L) -   Albumin has a half-life of 21 days and is strongly affected by stress response and inflammatory process, therefore, do not expect to see an improvement in this lab value during acute hospitalization.    NUTRITION -  FOCUSED PHYSICAL EXAM: Unable to complete Nutrition-Focused physical exam at this time.     Diet Order:   Diet Order             Diet NPO time specified  Diet effective now                   EDUCATION NEEDS:  Not appropriate for education at this time  Skin:  Skin Assessment: Reviewed RN Assessment  Last BM:  12/12 type 7  Height:   Ht Readings from Last 1 Encounters:  12/24/20 5\' 7"  (1.702 m)    Weight:   Wt Readings from Last 1 Encounters:  12/27/20 94.8 kg    Ideal Body Weight:   61 kg  BMI:  Body mass index is 32.73 kg/m.  Estimated Nutritional Needs:   Kcal:  1800-1900  Protein:  44-48 gr  Fluid:  < 2liters daily  Colman Cater MS,RD,CSG,LDN Contact: AMION

## 2020-12-27 NOTE — Transfer of Care (Signed)
Immediate Anesthesia Transfer of Care Note  Patient: Beth Padilla  Procedure(s) Performed: COLONOSCOPY WITH PROPOFOL BIOPSY  Patient Location: PACU  Anesthesia Type:General  Level of Consciousness: awake, alert  and oriented  Airway & Oxygen Therapy: Patient Spontanous Breathing and Patient connected to nasal cannula oxygen  Post-op Assessment: Report given to RN and Post -op Vital signs reviewed and stable  Post vital signs: Reviewed and stable  Last Vitals:  Vitals Value Taken Time  BP 101/40 12/27/20 1140  Temp 37.4 C 12/27/20 1140  Pulse 84 12/27/20 1140  Resp 25 12/27/20 1140  SpO2 92 % 12/27/20 1140  Vitals shown include unvalidated device data.  Last Pain:  Vitals:   12/27/20 1140  TempSrc: Oral  PainSc:       Patients Stated Pain Goal: 0 (01/01/23 4695)  Complications: No notable events documented.

## 2020-12-27 NOTE — [facility] Oncology Navigator Note (Signed)
Received notification of oncology consult.  Dr. Delton Coombes is not in person today.  After speaking with Dr. Delton Coombes about patient via telephone, he gave orders for patient to have CT Chest abdomen pelvis with contrast and a CEA level drawn today.  He will see patient tomorrow in person upon his return.    I called and spoke with Audrea Muscat, Charge RN for unit 300 and gave her the notification that orders were placed and that Dr. Delton Coombes will be seeing patient tomorrow.  She said that currently, patient is asleep and her family was gone.  She will notify patient's nurse who will notify family this evening.   I spoke with Dr. Lonny Prude via phone and advised of orders, he wants order changed to CT CAP without contrast because of patients AKI.  Orders changed.  Aware that oncology would not see patient until tomorrow.

## 2020-12-27 NOTE — Anesthesia Postprocedure Evaluation (Signed)
Anesthesia Post Note  Patient: Beth Padilla  Procedure(s) Performed: COLONOSCOPY WITH PROPOFOL BIOPSY  Patient location during evaluation: Phase II Anesthesia Type: General Level of consciousness: awake and alert and oriented Pain management: pain level controlled Vital Signs Assessment: post-procedure vital signs reviewed and stable Respiratory status: spontaneous breathing, nonlabored ventilation and respiratory function stable Cardiovascular status: blood pressure returned to baseline and stable Postop Assessment: no apparent nausea or vomiting Anesthetic complications: no   No notable events documented.   Last Vitals:  Vitals:   12/27/20 1215 12/27/20 1300  BP: (!) 122/44 117/81  Pulse: 83 83  Resp: 18 18  Temp: 36.6 C   SpO2: 96% 99%    Last Pain:  Vitals:   12/27/20 1215  TempSrc: Oral  PainSc:                  [name]

## 2020-12-27 NOTE — Anesthesia Preprocedure Evaluation (Addendum)
Anesthesia Evaluation  Patient identified by MRN, date of birth, ID band Patient awake    Reviewed: Allergy & Precautions, NPO status , Patient's Chart, lab work & pertinent test results, reviewed documented beta blocker date and time   History of Anesthesia Complications Negative for: history of anesthetic complications  Airway Mallampati: II  TM Distance: >3 FB Neck ROM: Full    Dental  (+) Dental Advisory Given, Missing, Poor Dentition   Pulmonary pneumonia, resolved,  FINDINGS: Cardiomegaly with mild interstitial edema. Layering small bilateral pleural effusions, right greater than left, improved.  No pneumothorax.  Thoracic aortic atherosclerosis.  IMPRESSION: Cardiomegaly with mild interstitial edema and small layering bilateral pleural effusions, right greater than left.   Electronically Signed   By: Julian Hy M.D.   On: 12/25/2020 01:38    Pulmonary exam normal breath sounds clear to auscultation       Cardiovascular Exercise Tolerance: Poor hypertension, Pt. on medications + CAD and +CHF  + dysrhythmias Atrial Fibrillation + Valvular Problems/Murmurs AS  Rhythm:Irregular Rate:Normal + Systolic murmurs [id number] 02:46:53 [place] [facility]-AP-ED ROUTINE RECORD [id number] (9 yr) Female Black Test [id number] of breath Vent. rate 79 BPM PR interval * ms QRS duration 74 ms QT/QTcB 384/440 ms P-R-T axes * 91 255 Atrial fibrillation Rightward axis Nonspecific ST and T wave abnormality Abnormal ECG   Neuro/Psych PSYCHIATRIC DISORDERS Depression CVA, Residual Symptoms    GI/Hepatic GERD  Medicated,Acute GI bleed   Endo/Other  diabetes, Well Controlled, Type 2Hypothyroidism   Renal/GU Renal InsufficiencyRenal disease (AKI)     Musculoskeletal negative musculoskeletal ROS (+)   Abdominal   Peds  Hematology  (+) anemia ,   Anesthesia Other Findings 1. Left ventricular  ejection fraction, by estimation, is 60 to 65%. The  left ventricle has normal function. The left ventricle has no regional  wall motion abnormalities. Left ventricular diastolic function could not  be evaluated. There is the  interventricular septum is flattened in systole and diastole, consistent  with right ventricular pressure and volume overload.  2. Right ventricular systolic function is mildly reduced. The right  ventricular size is mildly enlarged. There is severely elevated pulmonary  artery systolic pressure. The estimated right ventricular systolic  pressure is 67.1 mmHg.  3. Left atrial size was severely dilated.  4. Right atrial size was mild to moderately dilated.  5. The mitral valve is degenerative. Mild mitral valve regurgitation. No  evidence of mitral stenosis.  6. Tricuspid valve regurgitation is mild to moderate.  7. The aortic valve is tricuspid. There is moderate calcification of the  aortic valve. There is moderate thickening of the aortic valve. Aortic  valve regurgitation is mild. Moderate aortic valve stenosis. Aortic valve  area, by VTI measures 1.07 cm.  Aortic valve mean gradient measures 24.0 mmHg. Aortic valve Vmax measures  2.96 m/s.  8. The inferior vena cava is dilated in size with <50% respiratory  variability, suggesting right atrial pressure of 15 mmHg.   Reproductive/Obstetrics negative OB ROS                             Anesthesia Physical  Anesthesia Plan  ASA: 4 and emergent  Anesthesia Plan: General   Post-op Pain Management:    Induction: Intravenous  PONV Risk Score and Plan: Propofol infusion  Airway Management Planned: Nasal Cannula, Natural Airway and Simple Face Mask  Additional Equipment:   Intra-op Plan:   Post-operative Plan:  Informed Consent: I have reviewed the patients History and Physical, chart, labs and discussed the procedure including the risks, benefits and alternatives for  the proposed anesthesia with the patient or authorized representative who has indicated his/her understanding and acceptance.     Dental advisory given  Plan Discussed with: CRNA and Surgeon  Anesthesia Plan Comments:        Anesthesia Quick Evaluation

## 2020-12-27 NOTE — Interval H&P Note (Signed)
History and Physical Interval Note:  12/27/2020 10:52 AM  Beth Padilla  has presented today for surgery, with the diagnosis of GI bleed, Acute blood loss anemia.  The various methods of treatment have been discussed with the patient and family. After consideration of risks, benefits and other options for treatment, the patient has consented to  Procedure(s): ESOPHAGOGASTRODUODENOSCOPY (EGD) WITH PROPOFOL (N/A) COLONOSCOPY WITH PROPOFOL (N/A) as a surgical intervention.  The patient's history has been reviewed, patient examined, no change in status, stable for surgery.  I have reviewed the patient's chart and labs.  Questions were answered to the patient's satisfaction.     [name]

## 2020-12-27 NOTE — Progress Notes (Signed)
Tap water enema completed. Patient tolerated procedure well.

## 2020-12-28 ENCOUNTER — Encounter (HOSPITAL_COMMUNITY): Payer: Self-pay | Admitting: Internal Medicine

## 2020-12-28 ENCOUNTER — Inpatient Hospital Stay (HOSPITAL_COMMUNITY): Payer: Medicare Other

## 2020-12-28 DIAGNOSIS — D500 Iron deficiency anemia secondary to blood loss (chronic): Secondary | ICD-10-CM | POA: Diagnosis not present

## 2020-12-28 DIAGNOSIS — K6389 Other specified diseases of intestine: Secondary | ICD-10-CM

## 2020-12-28 DIAGNOSIS — C189 Malignant neoplasm of colon, unspecified: Secondary | ICD-10-CM | POA: Diagnosis present

## 2020-12-28 DIAGNOSIS — K922 Gastrointestinal hemorrhage, unspecified: Secondary | ICD-10-CM | POA: Diagnosis not present

## 2020-12-28 DIAGNOSIS — K769 Liver disease, unspecified: Secondary | ICD-10-CM | POA: Diagnosis not present

## 2020-12-28 DIAGNOSIS — J189 Pneumonia, unspecified organism: Secondary | ICD-10-CM

## 2020-12-28 LAB — BPAM RBC
Blood Product Expiration Date: 202212252359
Blood Product Expiration Date: 202212282359
Blood Product Expiration Date: 202212282359
Blood Product Expiration Date: 202212302359
Blood Product Expiration Date: 202301022359
ISSUE DATE / TIME: 202212100115
ISSUE DATE / TIME: 202212100424
ISSUE DATE / TIME: 202212100743
ISSUE DATE / TIME: 202212110048
Unit Type and Rh: 600
Unit Type and Rh: 6200
Unit Type and Rh: 6200
Unit Type and Rh: 6200
Unit Type and Rh: 6200

## 2020-12-28 LAB — GLUCOSE, CAPILLARY
Glucose-Capillary: 77 mg/dL (ref 70–99)
Glucose-Capillary: 110 mg/dL — ABNORMAL HIGH (ref 70–99)
Glucose-Capillary: 137 mg/dL — ABNORMAL HIGH (ref 70–99)
Glucose-Capillary: 121 mg/dL — ABNORMAL HIGH (ref 70–99)

## 2020-12-28 LAB — RESPIRATORY PANEL BY PCR

## 2020-12-28 LAB — TYPE AND SCREEN
ABO/RH(D): A POS
Antibody Screen: NEGATIVE
Unit division: 0
Unit division: 0
Unit division: 0
Unit division: 0
Unit division: 0

## 2020-12-28 LAB — CBC
HCT: 25.2 % — ABNORMAL LOW (ref 36.0–46.0)
Hemoglobin: 7.8 g/dL — ABNORMAL LOW (ref 12.0–15.0)
MCH: 28 pg (ref 26.0–34.0)
MCHC: 31 g/dL (ref 30.0–36.0)
MCV: 90.3 fL (ref 80.0–100.0)
Platelets: 228 10*3/uL (ref 150–400)
RBC: 2.79 MIL/uL — ABNORMAL LOW (ref 3.87–5.11)
RDW: 20.4 % — ABNORMAL HIGH (ref 11.5–15.5)
WBC: 12.6 10*3/uL — ABNORMAL HIGH (ref 4.0–10.5)
nRBC: 0.3 % — ABNORMAL HIGH (ref 0.0–0.2)

## 2020-12-28 LAB — RENAL FUNCTION PANEL
Albumin: 2.3 g/dL — ABNORMAL LOW (ref 3.5–5.0)
Anion gap: 8 (ref 5–15)
BUN: 126 mg/dL — ABNORMAL HIGH (ref 8–23)
CO2: 23 mmol/L (ref 22–32)
Calcium: 7.8 mg/dL — ABNORMAL LOW (ref 8.9–10.3)
Chloride: 111 mmol/L (ref 98–111)
Creatinine, Ser: 3.74 mg/dL — ABNORMAL HIGH (ref 0.44–1.00)
GFR, Estimated: 12 mL/min — ABNORMAL LOW (ref >60)
Glucose, Bld: 92 mg/dL (ref 70–99)
Phosphorus: 4.8 mg/dL — ABNORMAL HIGH (ref 2.5–4.6)
Potassium: 5 mmol/L (ref 3.5–5.1)
Sodium: 142 mmol/L (ref 135–145)

## 2020-12-28 LAB — EXPECTORATED SPUTUM ASSESSMENT W GRAM STAIN, RFLX TO RESP C

## 2020-12-28 LAB — SURGICAL PATHOLOGY

## 2020-12-28 LAB — STREP PNEUMONIAE URINARY ANTIGEN: Strep Pneumo Urinary Antigen: NEGATIVE

## 2020-12-28 MED ORDER — FUROSEMIDE 10 MG/ML IJ SOLN
40.0000 mg | Freq: Two times a day (BID) | INTRAMUSCULAR | Status: DC
Start: 2020-12-28 — End: 2020-12-30
  Administered 2020-12-28 – 2020-12-30 (×4): 40 mg via INTRAVENOUS
  Filled 2020-12-28 (×4): qty 4

## 2020-12-28 MED ORDER — LACTATED RINGERS IV SOLN: INTRAVENOUS | Status: DC

## 2020-12-28 MED ORDER — AZITHROMYCIN 250 MG PO TABS
500.0000 mg | ORAL_TABLET | Freq: Every day | ORAL | Status: DC
  Administered 2020-12-28 – 2020-12-30 (×3): 500 mg via ORAL
  Filled 2020-12-28 (×3): qty 2

## 2020-12-28 MED ORDER — ALBUTEROL SULFATE (2.5 MG/3ML) 0.083% IN NEBU: 2.5000 mg | INHALATION_SOLUTION | RESPIRATORY_TRACT | Status: DC | PRN

## 2020-12-28 MED ORDER — SODIUM CHLORIDE 0.9 % IV SOLN
2.0000 g | INTRAVENOUS | Status: DC
  Administered 2020-12-28 – 2020-12-30 (×3): 2 g via INTRAVENOUS
  Filled 2020-12-28 (×3): qty 20

## 2020-12-28 NOTE — Progress Notes (Addendum)
Subjective: Denies rectal bleeding, melena, abdominal pain, nausea, vomiting.  She is tolerating her diet well.  She was having shortness of breath, now improved with 3 L supplemental O2.  Objective: Vital signs in last 24 hours: Temp:  [98.5 F (36.9 C)-99.9 F (37.7 C)] 99.9 F (37.7 C) (12/13 1310) Pulse Rate:  [75-79] 75 (12/13 1310) Resp:  [16-20] 20 (12/13 1310) BP: (131-143)/(52-57) 142/52 (12/13 1310) SpO2:  [96 %-100 %] 100 % (12/13 1310) Weight:  [96.4 kg] 96.4 kg (12/13 0536) Last BM Date: 12/27/20 General:   Alert and oriented, pleasant, no acute distress, chronically ill-appearing Head:  Normocephalic and atraumatic. Eyes:  No icterus, sclera clear. Conjuctiva pink.  Abdomen:  Bowel sounds present, soft, non-tender, non-distended. No HSM or hernias noted. No rebound or guarding. No masses appreciated  Msk:  Symmetrical without gross deformities. Normal posture. Extremities:  Without edema. Psych:  Normal mood and affect.  Intake/Output from previous day: 12/12 0701 - 12/13 0700 In: 895.6 [P.O.:310; I.V.:585.6] Out: 300 [Urine:300] Intake/Output this shift: No intake/output data recorded.  Lab Results: Recent Labs    12/26/20 0736 12/26/20 1629 12/27/20 0413 12/27/20 1732 12/28/20 0441  WBC 8.6  --  11.0*  --  12.6*  HGB 8.0*   < > 8.8* 8.1* 7.8*  HCT 24.7*   < > 28.4* 25.9* 25.2*  PLT 182  --  255  --  228   < > = values in this interval not displayed.   BMET Recent Labs    12/26/20 0736 12/27/20 0413 12/28/20 0441  NA 141 140 142  K 5.4* 5.1 5.0  CL 112* 111 111  CO2 22 22 23   GLUCOSE 136* 142* 92  BUN 146* 134* 126*  CREATININE 3.88* 3.72* 3.74*  CALCIUM 7.5* 7.7* 7.8*   LFT Recent Labs    12/26/20 0736 12/27/20 0413 12/28/20 0441  ALBUMIN 2.2* 2.4* 2.3*     Studies/Results: US Venous Img Upper Uni Right(DVT)  Result Date: 12/28/2020 CLINICAL DATA:  Right upper extremity swelling EXAM: RIGHT UPPER EXTREMITY VENOUS DOPPLER  ULTRASOUND TECHNIQUE: Gray-scale sonography with graded compression, as well as color Doppler and duplex ultrasound were performed to evaluate the upper extremity deep venous system from the level of the subclavian vein and including the jugular, axillary, basilic, radial, ulnar and upper cephalic vein. Spectral Doppler was utilized to evaluate flow at rest and with distal augmentation maneuvers. COMPARISON:  None. FINDINGS: Contralateral Subclavian Vein: Respiratory phasicity is normal and symmetric with the symptomatic side. No evidence of thrombus. Normal compressibility. Internal Jugular Vein: No evidence of thrombus. Normal compressibility, respiratory phasicity and response to augmentation. Subclavian Vein: No evidence of thrombus. Normal compressibility, respiratory phasicity and response to augmentation. Axillary Vein: No evidence of thrombus. Normal compressibility, respiratory phasicity and response to augmentation. Cephalic Vein: No evidence of thrombus. Normal compressibility, respiratory phasicity and response to augmentation. Basilic Vein: No evidence of thrombus. Normal compressibility, respiratory phasicity and response to augmentation. Brachial Veins: No evidence of thrombus. Normal compressibility, respiratory phasicity and response to augmentation. Radial Veins: No evidence of thrombus. Normal compressibility, respiratory phasicity and response to augmentation. Ulnar Veins: No evidence of thrombus. Normal compressibility, respiratory phasicity and response to augmentation. Venous Reflux:  None visualized. Other Findings: Ill-defined hypoechoic fluid/edema noted throughout the superficial fat and soft tissues. IMPRESSION: No evidence of DVT within the right upper extremity. Extensive superficial edema versus cellulitis. Electronically Signed   By: Jacqulynn Cadet M.D.   On: 12/28/2020 11:51   CT  CHEST ABDOMEN PELVIS WO CONTRAST  Result Date: 12/27/2020 CLINICAL DATA:  Colon mass, for  staging.  GI bleed. EXAM: CT CHEST, ABDOMEN AND PELVIS WITHOUT CONTRAST TECHNIQUE: Multidetector CT imaging of the chest, abdomen and pelvis was performed following the standard protocol without IV contrast. COMPARISON:  CT abdomen/pelvis dated 05/03/2019. CT chest dated 02/13/2018. FINDINGS: Markedly limited evaluation due to lack of intravenous contrast administration, motion degradation, quantum mottle artifact, and streak artifact from the patient's arms. CT CHEST FINDINGS Cardiovascular: Heart is normal in size.  No pericardial effusion. Atherosclerotic calcifications of the arch. No evidence of thoracic aortic aneurysm. Coronary atherosclerosis of the LAD. Mediastinum/Nodes: No suspicious mediastinal lymphadenopathy. Visualized thyroid is grossly unremarkable. Lungs/Pleura: Multifocal patchy/nodular opacities in the upper lobes with patchy opacities in the lingula, posterior right middle lobe, and bilateral lower lobes. This appearance favors multifocal pneumonia. Small bilateral pleural effusions with associated compressive atelectasis in the right lower lobe. Evaluation of the lung parenchyma is constrained by respiratory motion. Within that constraint, there are no suspicious pulmonary nodules. No pleural effusion or pneumothorax. Musculoskeletal: Degenerative changes of the thoracic spine. CT ABDOMEN PELVIS FINDINGS Hepatobiliary: Evaluation of the liver is markedly motion degraded. However, there are suspected hypodense liver lesions which are new from the prior, including a dominant 2.4 cm lesion in the central right liver (series 2/image 50), suspicious for hepatic metastases in the setting known colon mass. Gallbladder is poorly visualized but there are suspected layering gallstones (series 2/image 66). No intrahepatic or extrahepatic ductal dilatation. Pancreas: Grossly unremarkable. Spleen: Within normal limits. Adrenals/Urinary Tract: Adrenal glands are unremarkable. Kidneys within normal limits.   No hydronephrosis. Mildly thick-walled bladder, although underdistended. Stomach/Bowel: Stomach is within normal limits. No evidence of bowel obstruction. Appendix is not discretely visualized. Possible mild masslike wall thickening in the right colon (series 2/image 83), poorly visualized/evaluated. Left colon is underdistended. Vascular/Lymphatic: No evidence of abdominal aortic aneurysm. Atherosclerotic calcifications of the abdominal aorta and branch vessels. No gross abdominopelvic lymphadenopathy. Reproductive: Status post hysterectomy. No adnexal masses. Other: Small volume pelvic ascites. Body wall edema/anasarca. Musculoskeletal: Degenerative changes of the lumbar spine. IMPRESSION: Markedly limited evaluation, as described above. Possible mild masslike wall thickening in the right colon, poorly visualized/evaluated. Correlate with recent colonoscopy. Suspected hepatic metastases, poorly evaluated. Multifocal patchy/nodular opacities in the lungs bilaterally, favoring multifocal pneumonia. Small bilateral pleural effusions. Given the limitations of the current study, repeat CT chest abdomen pelvis with contrast or PET-CT is suggested in 4-6 weeks, as an outpatient, for staging. Electronically Signed   By: Julian Hy M.D.   On: 12/27/2020 19:33    Assessment: 80 year old female with past medical history of CKD, chronic anemia, atrial fibrillation on Eliquis, aortic stenosis, previous stroke, diabetes, HTN, dyslipidemia, heart failure, previous gastric ulcer, who presented to the emergency room morning of 12/10 with multiple episodes of rectal bleeding over the last 48 to 72 hours, possible melena as well but no associated abdominal pain.  She was found to have hemoglobin of 4.4. She received a total of 4 units PRBCs.  Hemoglobin improved as high as 8.8 on 12/12, 8.1 yesterday, and fairly stable at 7.8 today.  She denies any further GI bleeding.  Colonoscopy completed yesterday with nonbleeding  internal hemorrhoids, diverticulosis in sigmoid and descending colon, blood in entire examined colon, partially circumferential, likely malignant tumor in the ascending colon and proximal ascending colon biopsied.  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  CEA in process. CT chest abdomen pelvis without contrast completed yesterday was  motion degraded, but with suspected hypodense liver lesions, new, dominant 2.4 cm lesion in central right liver, findings findings concerning for hepatic metastasis.  Also found to have multifocal patchy/nodular opacities in the lungs bilaterally favoring multifocal pneumonia.  She has been started on antibiotics and official oncology consult is pending.   Plan: Will defer further management of colon cancer with concern for hepatic metastasis to oncology. Dr. Abbey Chatters recommended continuing to hold Eliquis as she has high risk for bleeding from colon mass.   Management of pneumonia per hospitalist. Continue to monitor H&H closely and transfuse for hemoglobin less than 7.    LOS: 3 days    12/28/2020, 3:12 PM   Beth Padilla, [facility]

## 2020-12-28 NOTE — Consult Note (Signed)
[facility] Consultation Oncology  Name: Beth Padilla      MRN: [id number]    Location: A312/A312-01  Date: 12/28/2020 Time:5:06 PM   REFERRING PHYSICIAN: Dr. Lonny Prude  REASON FOR CONSULT: Newly diagnosed ascending colon adenocarcinoma    HISTORY OF PRESENT ILLNESS: Beth Padilla is a 80 year old female with past medical history of CKD, chronic anemia, atrial fibrillation on Eliquis, aortic stenosis, previous stroke, diabetes, hypertension, dyslipidemia, heart failure, previous gastric ulcer presented to the ER on 12/25/2020 with multiple episodes of rectal bleeding over prior 2 to 3 days.  She was found to have a hemoglobin of 4.4.  She received total of 4 units PRBC.  She had colonoscopy done as an inpatient.  CT scan of the chest, abdomen and pelvis without contrast was also done.  She is being treated with antibiotics for possible pneumonia.  She lives at home with her son.  She is able to do all her ADLs.  She does not drive but does grocery shopping.  She uses a cane and a walker to ambulate since she had stroke in 2016 which resulted in lower extremity weakness.  She worked in a tobacco farm.  She does not report any family history of malignancies.  PAST MEDICAL HISTORY:   Past Medical History:  Diagnosis Date   Anemia    Atrial fibrillation (HCC)    CHF (congestive heart failure) ([place])    Coronary artery disease    Depression    Diabetes mellitus    GERD (gastroesophageal reflux disease)    Hyperlipidemia 12/15/2010   Hypertension    Hypothyroidism 12/15/2010   Pneumonia    Renal disorder    Stroke [facility])     ALLERGIES: Allergies  Allergen Reactions   Ampicillin Rash      MEDICATIONS: I have reviewed the patient's current medications.     PAST SURGICAL HISTORY Past Surgical History:  Procedure Laterality Date   ABDOMINAL HYSTERECTOMY     BIOPSY  08/05/2020   Procedure: BIOPSY;  Surgeon: [name], DO;  [facility];  Service: Endoscopy;;    BIOPSY  12/27/2020   Procedure: BIOPSY;  Surgeon: [name], DO;  [facility];  Service: Endoscopy;;   COLONOSCOPY WITH PROPOFOL N/A 12/27/2020   Procedure: COLONOSCOPY WITH PROPOFOL;  Surgeon: [name], DO;  [facility];  Service: Endoscopy;  Laterality: N/A;   EP IMPLANTABLE DEVICE N/A 07/21/2014   Procedure: Loop Recorder Insertion;  Surgeon: [name], MD;  Location: [place] [facility];  Service: Cardiovascular;  Laterality: N/A;   ESOPHAGOGASTRODUODENOSCOPY (EGD) WITH PROPOFOL N/A 08/05/2020   Procedure: ESOPHAGOGASTRODUODENOSCOPY (EGD) WITH PROPOFOL;  Surgeon: [name], DO;  [facility];  Service: Endoscopy;  Laterality: N/A;   ESOPHAGOGASTRODUODENOSCOPY (EGD) WITH PROPOFOL N/A 09/03/2020   Procedure: ESOPHAGOGASTRODUODENOSCOPY (EGD) WITH PROPOFOL;  Surgeon: [name], MD;  [facility];  Service: Gastroenterology;  Laterality: N/A;   GIVENS CAPSULE STUDY N/A 09/03/2020   Procedure: GIVENS CAPSULE STUDY;  Surgeon: [name], MD;  [facility];  Service: Gastroenterology;  Laterality: N/A;  possible capsule deployment if egd unremarkable   implantable loop recorder removal  10/14/2018   MDT Reveal LINQ removed in office by Dr [name]   TEE WITHOUT CARDIOVERSION N/A 07/21/2014   Procedure: TRANSESOPHAGEAL ECHOCARDIOGRAM (TEE);  Surgeon: [name], MD;  Location: [facility] ENDOSCOPY;  Service: Cardiovascular;  Laterality: N/A;    FAMILY HISTORY: Family History  Problem  Relation Age of Onset   Stroke Mother    Cancer Sister     SOCIAL HISTORY:  reports that she has never smoked. She has never used smokeless tobacco. She reports that she does not drink alcohol and does not use drugs.  PERFORMANCE STATUS: The patient's performance status is 2 - Symptomatic, <50% confined to bed  PHYSICAL EXAM: Most Recent Vital Signs: Blood pressure (!) 142/52, pulse 75, temperature 99.9 F  (37.7 C), temperature source Oral, resp. rate 20, height 5\' 7"  (1.702 m), weight 212 lb 8.4 oz (96.4 kg), SpO2 100 %. BP (!) 142/52 (BP Location: Left Arm)    Pulse 75    Temp 99.9 F (37.7 C) (Oral)    Resp 20    Ht 5\' 7"  (1.702 m)    Wt 212 lb 8.4 oz (96.4 kg)    SpO2 100%    BMI 33.29 kg/m  General appearance: alert and cooperative Lungs:  Bilateral air entry with wheezing Heart: regularly regular rhythm Abdomen:  Soft, nontender with no palpable masses. Extremities:  Trace edema bilaterally. Neurologic: Grossly normal  LABORATORY DATA:  Results for orders placed or performed during the hospital encounter of 12/25/20 (from the past 48 hour(s))  Hemoglobin and hematocrit, blood     Status: Abnormal   Collection Time: 12/26/20  8:37 PM  Result Value Ref Range   Hemoglobin 8.2 (L) 12.0 - 15.0 g/dL   HCT 25.9 (L) 36.0 - 46.0 %    Comment: Performed at [facility], [address] [place], [place] [address]  CBC     Status: Abnormal   Collection Time: 12/27/20  4:13 AM  Result Value Ref Range   WBC 11.0 (H) 4.0 - 10.5 K/uL   RBC 3.26 (L) 3.87 - 5.11 MIL/uL   Hemoglobin 8.8 (L) 12.0 - 15.0 g/dL   HCT 28.4 (L) 36.0 - 46.0 %   MCV 87.1 80.0 - 100.0 fL   MCH 27.0 26.0 - 34.0 pg   MCHC 31.0 30.0 - 36.0 g/dL   RDW 19.5 (H) 11.5 - 15.5 %   Platelets 255 150 - 400 K/uL   nRBC 0.4 (H) 0.0 - 0.2 %    Comment: Performed at [facility], [address] [place], [place] [address]  Renal function panel     Status: Abnormal   Collection Time: 12/27/20  4:13 AM  Result Value Ref Range   Sodium 140 135 - 145 mmol/L   Potassium 5.1 3.5 - 5.1 mmol/L   Chloride 111 98 - 111 mmol/L   CO2 22 22 - 32 mmol/L   Glucose, Bld 142 (H) 70 - 99 mg/dL    Comment: Glucose reference range applies only to samples taken after fasting for at least 8 hours.   BUN 134 (H) 8 - 23 mg/dL    Comment: RESULTS CONFIRMED BY MANUAL DILUTION   Creatinine, Ser 3.72 (H) 0.44 - 1.00 mg/dL   Calcium 7.7 (L) 8.9 - 10.3  mg/dL   Phosphorus 4.5 2.5 - 4.6 mg/dL   Albumin 2.4 (L) 3.5 - 5.0 g/dL   GFR, Estimated 12 (L) >60 mL/min    Comment: (NOTE) Calculated using the CKD-EPI Creatinine Equation (2021)    Anion gap 7 5 - 15    Comment: Performed at [facility], [address] [place], [place] [address]  Glucose, capillary     Status: Abnormal   Collection Time: 12/27/20  7:14 AM  Result Value Ref Range   Glucose-Capillary 135 (H) 70 -  99 mg/dL    Comment: Glucose reference range applies only to samples taken after fasting for at least 8 hours.  Glucose, capillary     Status: Abnormal   Collection Time: 12/27/20 10:33 AM  Result Value Ref Range   Glucose-Capillary 129 (H) 70 - 99 mg/dL    Comment: Glucose reference range applies only to samples taken after fasting for at least 8 hours.  Surgical pathology     Status: None   Collection Time: 12/27/20 11:25 AM  Result Value Ref Range   SURGICAL PATHOLOGY      SURGICAL PATHOLOGY CASE: [id number] PATIENT: Wonda Cerise Surgical Pathology Report     Clinical History: GI bleed, acute blood loss anemia     FINAL MICROSCOPIC DIAGNOSIS:  A. COLON, ASCENDING, BIOPSY: - Invasive moderately differentiated adenocarcinoma.  COMMENT:  This case was also reviewed by Dr. Betsy Pries, who is in agreement. Results were communicated to Dr. Abbey Chatters via [facility] secure chat.   GROSS DESCRIPTION:  Received in formalin are tan, soft tissue fragments that are submitted in toto. Number: Multiple size: Range from 0.1 to 0.4 cm blocks: 1 Craig Staggers 12/27/2020)    Final Diagnosis performed by Allena Napoleon, MD.   Electronically signed 12/28/2020 Technical and / or Professional components performed at [facility], [place] [address], [place], [place] [address].  Immunohistochemistry Technical component (if applicable) was performed at [facility] [address], [place], [place],  [address].    IMMUNOHISTOCHEMISTRY  DISCLAIMER (if applicable): Some of these immunohistochemical stains may have been developed and the performance characteristics determine by [facility]. Some may not have been cleared or approved by the U.S. Food and Drug Administration. The FDA has determined that such clearance or approval is not necessary. This test is used for clinical purposes. It should not be regarded as investigational or for research. This laboratory is certified under the [place] (CLIA-88) as qualified to perform high complexity clinical laboratory testing.  The controls stained appropriately.   Glucose, capillary     Status: Abnormal   Collection Time: 12/27/20  4:10 PM  Result Value Ref Range   Glucose-Capillary 121 (H) 70 - 99 mg/dL    Comment: Glucose reference range applies only to samples taken after fasting for at least 8 hours.  Hemoglobin and hematocrit, blood     Status: Abnormal   Collection Time: 12/27/20  5:32 PM  Result Value Ref Range   Hemoglobin 8.1 (L) 12.0 - 15.0 g/dL   HCT 25.9 (L) 36.0 - 46.0 %    Comment: Performed at [facility], [address] [place],  [address]  Glucose, capillary     Status: Abnormal   Collection Time: 12/27/20  9:58 PM  Result Value Ref Range   Glucose-Capillary 110 (H) 70 - 99 mg/dL    Comment: Glucose reference range applies only to samples taken after fasting for at least 8 hours.  CBC     Status: Abnormal   Collection Time: 12/28/20  4:41 AM  Result Value Ref Range   WBC 12.6 (H) 4.0 - 10.5 K/uL   RBC 2.79 (L) 3.87 - 5.11 MIL/uL   Hemoglobin 7.8 (L) 12.0 - 15.0 g/dL   HCT 25.2 (L) 36.0 - 46.0 %   MCV 90.3 80.0 - 100.0 fL   MCH 28.0 26.0 - 34.0 pg   MCHC 31.0 30.0 - 36.0 g/dL   RDW 20.4 (H) 11.5 - 15.5 %   Platelets 228  150 - 400 K/uL   nRBC 0.3 (H) 0.0 - 0.2 %    Comment: Performed at [facility], [address] [place], [place] [address]  Renal function panel     Status: Abnormal    Collection Time: 12/28/20  4:41 AM  Result Value Ref Range   Sodium 142 135 - 145 mmol/L   Potassium 5.0 3.5 - 5.1 mmol/L   Chloride 111 98 - 111 mmol/L   CO2 23 22 - 32 mmol/L   Glucose, Bld 92 70 - 99 mg/dL    Comment: Glucose reference range applies only to samples taken after fasting for at least 8 hours.   BUN 126 (H) 8 - 23 mg/dL    Comment: RESULTS CONFIRMED BY MANUAL DILUTION   Creatinine, Ser 3.74 (H) 0.44 - 1.00 mg/dL   Calcium 7.8 (L) 8.9 - 10.3 mg/dL   Phosphorus 4.8 (H) 2.5 - 4.6 mg/dL   Albumin 2.3 (L) 3.5 - 5.0 g/dL   GFR, Estimated 12 (L) >60 mL/min    Comment: (NOTE) Calculated using the CKD-EPI Creatinine Equation (2021)    Anion gap 8 5 - 15    Comment: Performed at [facility], [address] [place], [place] [address]  Glucose, capillary     Status: None   Collection Time: 12/28/20  8:20 AM  Result Value Ref Range   Glucose-Capillary 77 70 - 99 mg/dL    Comment: Glucose reference range applies only to samples taken after fasting for at least 8 hours.   Comment 1 Notify RN    Comment 2 Document in Chart   Glucose, capillary     Status: Abnormal   Collection Time: 12/28/20 11:14 AM  Result Value Ref Range   Glucose-Capillary 110 (H) 70 - 99 mg/dL    Comment: Glucose reference range applies only to samples taken after fasting for at least 8 hours.   Comment 1 Notify RN    Comment 2 Document in Chart   Strep pneumoniae urinary antigen     Status: None   Collection Time: 12/28/20 11:27 AM  Result Value Ref Range   Strep Pneumo Urinary Antigen NEGATIVE NEGATIVE    Comment:        Infection due to S. pneumoniae cannot be absolutely ruled out since the antigen present may be below the detection limit of the test. PERFORMED AT [facility] Performed at [facility], [place] [address] [place], [place] [address]   Expectorated Sputum Assessment w Gram Stain, Rflx to Resp Cult     Status: None   Collection Time: 12/28/20 12:52 PM   Specimen:  Expectorated Sputum  Result Value Ref Range   Specimen Description EXPECTORATED SPUTUM    Special Requests NONE    Sputum evaluation      Sputum specimen not acceptable for testing.  Please recollect.   Gram Stain Report Called to,Read Back By and Verified With: ASHLEY CRADDOCK @ 2952 ON 12/28/20 C VARNER Performed at [facility], [address], [place], [place] [address]    Report Status 12/28/2020 FINAL   Glucose, capillary     Status: Abnormal   Collection Time: 12/28/20  4:16 PM  Result Value Ref Range   Glucose-Capillary 137 (H) 70 - 99 mg/dL    Comment: Glucose reference range applies only to samples taken after fasting for at least 8 hours.   Comment 1 Notify RN    Comment 2 Document in Chart       RADIOGRAPHY: US  Venous Img Upper Uni Right(DVT)  Result Date: 12/28/2020 CLINICAL DATA:  Right upper extremity swelling EXAM: RIGHT UPPER EXTREMITY VENOUS DOPPLER ULTRASOUND TECHNIQUE: Gray-scale sonography with graded compression, as well as color Doppler and duplex ultrasound were performed to evaluate the upper extremity deep venous system from the level of the subclavian vein and including the jugular, axillary, basilic, radial, ulnar and upper cephalic vein. Spectral Doppler was utilized to evaluate flow at rest and with distal augmentation maneuvers. COMPARISON:  None. FINDINGS: Contralateral Subclavian Vein: Respiratory phasicity is normal and symmetric with the symptomatic side. No evidence of thrombus. Normal compressibility. Internal Jugular Vein: No evidence of thrombus. Normal compressibility, respiratory phasicity and response to augmentation. Subclavian Vein: No evidence of thrombus. Normal compressibility, respiratory phasicity and response to augmentation. Axillary Vein: No evidence of thrombus. Normal compressibility, respiratory phasicity and response to augmentation. Cephalic Vein: No evidence of thrombus. Normal compressibility, respiratory phasicity and response to  augmentation. Basilic Vein: No evidence of thrombus. Normal compressibility, respiratory phasicity and response to augmentation. Brachial Veins: No evidence of thrombus. Normal compressibility, respiratory phasicity and response to augmentation. Radial Veins: No evidence of thrombus. Normal compressibility, respiratory phasicity and response to augmentation. Ulnar Veins: No evidence of thrombus. Normal compressibility, respiratory phasicity and response to augmentation. Venous Reflux:  None visualized. Other Findings: Ill-defined hypoechoic fluid/edema noted throughout the superficial fat and soft tissues. IMPRESSION: No evidence of DVT within the right upper extremity. Extensive superficial edema versus cellulitis. Electronically Signed   By: Jacqulynn Cadet M.D.   On: 12/28/2020 11:51   CT CHEST ABDOMEN PELVIS WO CONTRAST  Result Date: 12/27/2020 CLINICAL DATA:  Colon mass, for staging.  GI bleed. EXAM: CT CHEST, ABDOMEN AND PELVIS WITHOUT CONTRAST TECHNIQUE: Multidetector CT imaging of the chest, abdomen and pelvis was performed following the standard protocol without IV contrast. COMPARISON:  CT abdomen/pelvis dated 05/03/2019. CT chest dated 02/13/2018. FINDINGS: Markedly limited evaluation due to lack of intravenous contrast administration, motion degradation, quantum mottle artifact, and streak artifact from the patient's arms. CT CHEST FINDINGS Cardiovascular: Heart is normal in size.  No pericardial effusion. Atherosclerotic calcifications of the arch. No evidence of thoracic aortic aneurysm. Coronary atherosclerosis of the LAD. Mediastinum/Nodes: No suspicious mediastinal lymphadenopathy. Visualized thyroid is grossly unremarkable. Lungs/Pleura: Multifocal patchy/nodular opacities in the upper lobes with patchy opacities in the lingula, posterior right middle lobe, and bilateral lower lobes. This appearance favors multifocal pneumonia. Small bilateral pleural effusions with associated compressive  atelectasis in the right lower lobe. Evaluation of the lung parenchyma is constrained by respiratory motion. Within that constraint, there are no suspicious pulmonary nodules. No pleural effusion or pneumothorax. Musculoskeletal: Degenerative changes of the thoracic spine. CT ABDOMEN PELVIS FINDINGS Hepatobiliary: Evaluation of the liver is markedly motion degraded. However, there are suspected hypodense liver lesions which are new from the prior, including a dominant 2.4 cm lesion in the central right liver (series 2/image 50), suspicious for hepatic metastases in the setting known colon mass. Gallbladder is poorly visualized but there are suspected layering gallstones (series 2/image 66). No intrahepatic or extrahepatic ductal dilatation. Pancreas: Grossly unremarkable. Spleen: Within normal limits. Adrenals/Urinary Tract: Adrenal glands are unremarkable. Kidneys within normal limits.  No hydronephrosis. Mildly thick-walled bladder, although underdistended. Stomach/Bowel: Stomach is within normal limits. No evidence of bowel obstruction. Appendix is not discretely visualized. Possible mild masslike wall thickening in the right colon (series 2/image 83), poorly visualized/evaluated. Left colon is underdistended. Vascular/Lymphatic: No evidence of abdominal aortic aneurysm. Atherosclerotic calcifications of the abdominal aorta and branch  vessels. No gross abdominopelvic lymphadenopathy. Reproductive: Status post hysterectomy. No adnexal masses. Other: Small volume pelvic ascites. Body wall edema/anasarca. Musculoskeletal: Degenerative changes of the lumbar spine. IMPRESSION: Markedly limited evaluation, as described above. Possible mild masslike wall thickening in the right colon, poorly visualized/evaluated. Correlate with recent colonoscopy. Suspected hepatic metastases, poorly evaluated. Multifocal patchy/nodular opacities in the lungs bilaterally, favoring multifocal pneumonia. Small bilateral pleural effusions.  Given the limitations of the current study, repeat CT chest abdomen pelvis with contrast or PET-CT is suggested in 4-6 weeks, as an outpatient, for staging. Electronically Signed   By: Julian Hy M.D.   On: 12/27/2020 19:33       ASSESSMENT:  1.  Ascending colon adenocarcinoma: - Colonoscopy on 12/27/2020 with a frond-like/villous fungating infiltrative polypoid and submucosal nonobstructing mass found in the ascending colon.  The mass was partially circumferential involving one third of the lumen circumference.  No bleeding was present.  Nonbleeding internal hemorrhoids.  Diverticulosis in the sigmoid colon and descending colon. - Pathology consistent with invasive moderately differentiated adenocarcinoma. - CT CAP without contrast on 12/27/2020-Markedly Limited evaluation.  Multifocal patchy/nodular opacities in the lungs bilaterally.  Evaluation of the liver is markedly degraded by motion.  Suspected hypodense liver lesions which are new from the prior including a dominant 2.4 cm lesion in the central right liver.  2.  Social/family history: - She lives at home with her son.  She is able to do her ADLs and some IADLs including cooking.  She does not drive.  She uses a walker/cane for ambulation since she had stroke in 2016 resulting in lower extremity weakness.  She worked in a tobacco farm.  No family history of malignancies.  PLAN:  1.  Ascending colon adenocarcinoma: - I have reviewed CT images, pathology reports and colonoscopy with the patient and her granddaughter at bedside and her son over FaceTime. - CEA is pending at this time. - CT scan is not very helpful as she could not receive IV contrast due to CKD.  There was also motion degradation.  I doubt IR will biopsy liver lesion based on CT findings.  She cannot get IV contrast for MRI based on her elevated creatinine.  Even with MRI without contrast, I highly doubt that we will be able to get meaningful images given the degree of  motion artifact on CT scan. - We discussed various options including outpatient PET scan after discharge for accurate staging and discussion of further options.  We also talked about best supportive care in the form of hospice.  She and her children will have a discussion and let us know.  2.  Microcytic to normocytic anemia: - Severe anemia with hemoglobin 6.7 on 12/25/2020.  MCV was 78. - Etiology bleeding and CKD.  Agree with discontinuing Eliquis for atrial fibrillation. - She is status post 4 units PRBC. - Consider checking ferritin, iron panel, Q65 and folic acid.  All questions were answered. The patient knows to call the clinic with any problems, questions or concerns. We can certainly see the patient much sooner if necessary.   Derek Jack

## 2020-12-28 NOTE — Progress Notes (Signed)
PROGRESS NOTE    Beth Padilla  YIF:[id number] DOB: 1940-08-08 DOA: 12/25/2020 PCP: Kendrick Ranch, MD   Brief Narrative: Beth Padilla is a 80 y.o. female, with history of atrial fibrillation, CHF, coronary artery disease, diabetes mellitus type 2, GERD, hyperlipidemia, hypertension, hypothyroidism. Patient presented secondary to hematochezia with concern for GI bleeding. She was found to have profound symptomatic anemia requiring multiple blood transfusions. GI consulted with plan for upper and lower endoscopies.   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 Most recent episode requiring admission in 8 of 2022. Severe associated anemia this admission. Patient reports a history of colonoscopy, but unsure of when. No colonoscopy performed on previous admission.  Hematochezia with bowel prep. Colonoscopy on 12/12 significant for non-bleeding hemorrhoids, diverticulosis, blood in entire colon and a malignant tumor in the ascending colon. GI discussed case with general surgery with recommendations for no surgery secondary to candidacy concerns from comorbidites. No bowel movement overnight. -GI recommendations: hold Eliquis, pending today. -Trend CBC/H&H   Acute anemia Chronic anemia Symptomatic anemia Previous baseline hemoglobin of 8.6 in 08/2020. Most recent iron panel with normal ferritin and undetectable iron but with concurrent low TIBC. Chronic anemia likely from GI loss but iron panel suggests possible kidney disease component. Acute in setting of hematochezia as mentioned above. 3 units of PRBC ordered on admission. Patient has improved symptomatically.  Required another 1 unit of PRBC on 12/10. Hemoglobin now stable. -Continue blood transfusions as needed for hemoglobin <7   AKI on CKD stage IV Repeat metabolic panel is pending. No hypotension noted. Patient with some BLE edema and mild pulmonary edema on chest x-ray; on room air. Started on sodium bicarbonate for associated mild metabolic acidosis. Some improvement but mostly stable. Renal ultrasound without obstruction. -Transition to Lasix 40 mg IV BID -Continue sodium bicarbonate -Daily RFP   Pulmonary edema Acute diastolic heart failure Mild. Patient is on Lasix as an outpatient. Last EF of 55-60% from 07/2020. Lasix held secondary to severe anemia initially. Lasix restarted. UOP not accurately documented. Weight from admission to now do not seem likely accurate, but patient's weight has increased from yesterday -Lasix as mentioned above -Strict in/out, daily weight  Multifocal pneumonia Patient with mildly elevated WBC and productive cough. -Ceftriaxone/Azithromycin -Check sputum culture, urine strep/legionella antigen  Colonic mass Likely malignant. Biopsies obtained. Oncology consulted. CT chest/abdomen/pelvis obtained and significant for possible sites of metastases in  -Follow-up oncology recommendations -Follow-up biopsy  Right arm swelling Possibly related to IV site. No associated redness or tenderness -Venous US to rule out DVT -Elevate  Renal cyst Right sided. Noted on ultrasound.   Hyperkalemia Lokelma given. Resolved at this time. Likely secondary to AKI. Improved.  Hypocalcemia Mild. Corrected calcium of 8.9 on 12/11.  Depression -Continue Lexapro and Buspar  Atrial fibrillation On Eliquis as an outpatient. Not on rate control medication. Eliquis held secondary to GI bleeding   DVT prophylaxis: SCDs Code Status:   Code Status: Full Code Family Communication: None at bedside. Called daughter, no answer Disposition Plan: Discharge home likely in 3+  days pending GI recommendations, stability of hemoglobin, oncology recommendations, improvement of AKI/acute heart failure   Consultants:  Gastroenterology  Procedures:  COLONOSCOPY (12/27/2020) Impression:               - Preparation of the colon was fair.                           - Non-bleeding internal hemorrhoids.                           - Diverticulosis in the sigmoid colon and in the                            descending colon.                           - Blood in the entire examined colon.                           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 Biopsied.  Recommendation:           - Return patient to hospital ward for ongoing care.                           - Advance diet as tolerated.                           - Patient with large mass in her proximal ascending                            colon consistent with malignancy. I took extensive                            biopsies of this. Discussed case with surgery,                            given her comorbidities she would not be a                            candidate for surgery at least at our facility. We                            will arrange outpatient follow-up with oncology as                            well as staging CT scans. She can go on regular                            diet today. I would continue to hold her Eliquis as                            she is high risk for bleeding from this mass.  Antimicrobials: Ceftriaxone Azithromycin    Subjective: No issues  noted overnight. Productive cough. No bowel movement.  Objective: Vitals:   12/27/20 1215 12/27/20 1300 12/27/20 2154 12/28/20 0536  BP: (!) 122/44 117/81 (!) 131/57 (!) 143/57  Pulse: 83 83 79 78  Resp: 18 18 19 16   Temp: 97.9 F (36.6 C)  98.5 F (36.9 C) 99.9 F (37.7 C)  TempSrc: Oral  Oral Oral  SpO2: 96% 99% 100% 96%  Weight:    96.4 kg  Height:         Intake/Output Summary (Last 24 hours) at 12/28/2020 1040 Last data filed at 12/28/2020 0536 Gross per 24 hour  Intake 895.63 ml  Output 300 ml  Net 595.63 ml    Filed Weights   12/26/20 0500 12/27/20 0433 12/28/20 0536  Weight: 76.2 kg 94.8 kg 96.4 kg    Examination:  General exam: Appears calm and comfortable and in no acute distress. Conversant Respiratory: Diminished, rales, wheezing. Respiratory effort normal with no intercostal retractions or use of accessory muscles Cardiovascular: S1 & S2 heard, RRR. No murmurs, rubs, gallops or clicks. BLE and right upper pitting edema Gastrointestinal: Abdomen is non-distended, soft and non-tender. No masses felt. Normal bowel sounds heard Neurologic: No focal neurological deficits Musculoskeletal: No calf tenderness Skin: No cyanosis. No new rashes Psychiatry: Alert and oriented. Memory intact. Mood & affect appropriate    Data Reviewed: I have personally reviewed following labs and imaging studies  CBC Lab Results  Component Value Date   WBC 12.6 (H) 12/28/2020   RBC 2.79 (L) 12/28/2020   HGB 7.8 (L) 12/28/2020   HCT 25.2 (L) 12/28/2020   MCV 90.3 12/28/2020   MCH 28.0 12/28/2020   PLT 228 12/28/2020   MCHC 31.0 12/28/2020   RDW 20.4 (H) 12/28/2020   LYMPHSABS 0.6 (L) 12/25/2020   MONOABS 0.5 12/25/2020   EOSABS 0.0 12/25/2020   BASOSABS 0.0 68/34/1962     Last metabolic panel Lab Results  Component Value Date   NA 142 12/28/2020   K 5.0 12/28/2020   CL 111 12/28/2020   CO2 23 12/28/2020   BUN 126 (H) 12/28/2020   CREATININE 3.74 (H) 12/28/2020   GLUCOSE 92 12/28/2020   GFRNONAA 12 (L) 12/28/2020   GFRAA 23 (L) 05/04/2019   CALCIUM 7.8 (L) 12/28/2020   PHOS 4.8 (H) 12/28/2020   PROT 5.7 (L) 12/25/2020   ALBUMIN 2.3 (L) 12/28/2020   LABGLOB 3.0 07/25/2020   AGRATIO 0.9 07/25/2020   BILITOT 0.9 12/25/2020   ALKPHOS 62 12/25/2020   AST 22 12/25/2020   ALT 17 12/25/2020   ANIONGAP 8 12/28/2020     CBG (last 3)  Recent Labs    12/27/20 1610 12/27/20 2158 12/28/20 0820  GLUCAP 121* 110* 77      GFR: Estimated Creatinine Clearance: 14.1 mL/min (A) (by C-G formula based on SCr of 3.74 mg/dL (H)).  Coagulation Profile: Recent Labs  Lab 12/25/20 1151  INR 1.6*     Recent Results (from the past 240 hour(s))  Resp Panel by RT-PCR (Flu A&B, Covid) Nasopharyngeal Swab     Status: None   Collection Time: 12/25/20 12:10 AM   Specimen: Nasopharyngeal Swab; Nasopharyngeal(NP) swabs in vial transport medium  Result Value Ref Range Status   SARS Coronavirus 2 by RT PCR NEGATIVE NEGATIVE Final    Comment: (NOTE) SARS-CoV-2 target nucleic acids are NOT DETECTED.  The SARS-CoV-2 RNA is generally detectable in upper respiratory specimens during the acute phase of infection. The lowest concentration of SARS-CoV-2 viral copies this assay can detect  is 138 copies/mL. A negative result does not preclude SARS-Cov-2 infection and should not be used as the sole basis for treatment or other patient management decisions. A negative result may occur with  improper specimen collection/handling, submission of specimen other than nasopharyngeal swab, presence of viral mutation(s) within the areas targeted by this assay, and inadequate number of viral copies(<138 copies/mL). A negative result must be combined with clinical observations, patient history, and epidemiological information. The expected result is Negative.  Fact Sheet for Patients:  EntrepreneurPulse.com.au  Fact Sheet for Healthcare Providers:  IncredibleEmployment.be  This test is no t yet approved or cleared by the [place] FDA and  has been authorized for detection and/or diagnosis of SARS-CoV-2 by FDA under an Emergency Use Authorization (EUA). This EUA will remain  in effect (meaning this test can be used) for the duration of the COVID-19 declaration under Section 564(b)(1) of  the Act, 21 U.S.C.section 360bbb-3(b)(1), unless the authorization is terminated  or revoked sooner.       Influenza A by PCR NEGATIVE NEGATIVE Final   Influenza B by PCR NEGATIVE NEGATIVE Final    Comment: (NOTE) The Xpert Xpress SARS-CoV-2/FLU/RSV plus assay is intended as an aid in the diagnosis of influenza from Nasopharyngeal swab specimens and should not be used as a sole basis for treatment. Nasal washings and aspirates are unacceptable for Xpert Xpress SARS-CoV-2/FLU/RSV testing.  Fact Sheet for Patients: EntrepreneurPulse.com.au  Fact Sheet for Healthcare Providers: IncredibleEmployment.be  This test is not yet approved or cleared by the [place] FDA and has been authorized for detection and/or diagnosis of SARS-CoV-2 by FDA under an Emergency Use Authorization (EUA). This EUA will remain in effect (meaning this test can be used) for the duration of the COVID-19 declaration under Section 564(b)(1) of the Act, 21 U.S.C. section 360bbb-3(b)(1), unless the authorization is terminated or revoked.  Performed at [facility], [address] [place], [place] [address]          Radiology Studies: CT CHEST ABDOMEN PELVIS WO CONTRAST  Result Date: 12/27/2020 CLINICAL DATA:  Colon mass, for staging.  GI bleed. EXAM: CT CHEST, ABDOMEN AND PELVIS WITHOUT CONTRAST TECHNIQUE: Multidetector CT imaging of the chest, abdomen and pelvis was performed following the standard protocol without IV contrast. COMPARISON:  CT abdomen/pelvis dated 05/03/2019. CT chest dated 02/13/2018. FINDINGS: Markedly limited evaluation due to lack of intravenous contrast administration, motion degradation, quantum mottle artifact, and streak artifact from the patient's arms. CT CHEST FINDINGS Cardiovascular: Heart is normal in size.  No pericardial effusion. Atherosclerotic calcifications of the arch. No evidence of thoracic aortic aneurysm. Coronary atherosclerosis  of the LAD. Mediastinum/Nodes: No suspicious mediastinal lymphadenopathy. Visualized thyroid is grossly unremarkable. Lungs/Pleura: Multifocal patchy/nodular opacities in the upper lobes with patchy opacities in the lingula, posterior right middle lobe, and bilateral lower lobes. This appearance favors multifocal pneumonia. Small bilateral pleural effusions with associated compressive atelectasis in the right lower lobe. Evaluation of the lung parenchyma is constrained by respiratory motion. Within that constraint, there are no suspicious pulmonary nodules. No pleural effusion or pneumothorax. Musculoskeletal: Degenerative changes of the thoracic spine. CT ABDOMEN PELVIS FINDINGS Hepatobiliary: Evaluation of the liver is markedly motion degraded. However, there are suspected hypodense liver lesions which are new from the prior, including a dominant 2.4 cm lesion in the central right liver (series 2/image 50), suspicious for hepatic metastases in the setting known colon mass. Gallbladder is poorly visualized but there are suspected layering gallstones (series 2/image 66). No intrahepatic or extrahepatic  ductal dilatation. Pancreas: Grossly unremarkable. Spleen: Within normal limits. Adrenals/Urinary Tract: Adrenal glands are unremarkable. Kidneys within normal limits.  No hydronephrosis. Mildly thick-walled bladder, although underdistended. Stomach/Bowel: Stomach is within normal limits. No evidence of bowel obstruction. Appendix is not discretely visualized. Possible mild masslike wall thickening in the right colon (series 2/image 83), poorly visualized/evaluated. Left colon is underdistended. Vascular/Lymphatic: No evidence of abdominal aortic aneurysm. Atherosclerotic calcifications of the abdominal aorta and branch vessels. No gross abdominopelvic lymphadenopathy. Reproductive: Status post hysterectomy. No adnexal masses. Other: Small volume pelvic ascites. Body wall edema/anasarca. Musculoskeletal: Degenerative  changes of the lumbar spine. IMPRESSION: Markedly limited evaluation, as described above. Possible mild masslike wall thickening in the right colon, poorly visualized/evaluated. Correlate with recent colonoscopy. Suspected hepatic metastases, poorly evaluated. Multifocal patchy/nodular opacities in the lungs bilaterally, favoring multifocal pneumonia. Small bilateral pleural effusions. Given the limitations of the current study, repeat CT chest abdomen pelvis with contrast or PET-CT is suggested in 4-6 weeks, as an outpatient, for staging. Electronically Signed   By: Julian Hy M.D.   On: 12/27/2020 19:33        Scheduled Meds:  atorvastatin  80 mg Oral QPM   azithromycin  500 mg Oral Daily   busPIRone  10 mg Oral BID   escitalopram  10 mg Oral Daily   ferrous sulfate  325 mg Oral BID   furosemide  40 mg Intravenous Daily   insulin aspart  0-9 Units Subcutaneous TID WC   isosorbide dinitrate  30 mg Oral TID   levothyroxine  125 mcg Oral Daily   pantoprazole  40 mg Intravenous Q12H   sodium bicarbonate  1,300 mg Oral BID   Continuous Infusions:  cefTRIAXone (ROCEPHIN)  IV 2 g (12/28/20 0907)   lactated ringers 50 mL/hr at 12/28/20 1006     LOS: 3 days     Cordelia Poche, MD [facility] 12/28/2020, 10:40 AM  If 7PM-7AM, please contact night-coverage [email or web address]

## 2020-12-29 DIAGNOSIS — J101 Influenza due to other identified influenza virus with other respiratory manifestations: Secondary | ICD-10-CM | POA: Clinically undetermined

## 2020-12-29 LAB — RENAL FUNCTION PANEL
Albumin: 2.1 g/dL — ABNORMAL LOW (ref 3.5–5.0)
Anion gap: 10 (ref 5–15)
BUN: 122 mg/dL — ABNORMAL HIGH (ref 8–23)
CO2: 22 mmol/L (ref 22–32)
Calcium: 7.6 mg/dL — ABNORMAL LOW (ref 8.9–10.3)
Chloride: 107 mmol/L (ref 98–111)
Creatinine, Ser: 3.88 mg/dL — ABNORMAL HIGH (ref 0.44–1.00)
GFR, Estimated: 11 mL/min — ABNORMAL LOW (ref >60)
Glucose, Bld: 98 mg/dL (ref 70–99)
Phosphorus: 6.1 mg/dL — ABNORMAL HIGH (ref 2.5–4.6)
Potassium: 4.9 mmol/L (ref 3.5–5.1)
Sodium: 139 mmol/L (ref 135–145)

## 2020-12-29 LAB — CBC
HCT: 23.8 % — ABNORMAL LOW (ref 36.0–46.0)
Hemoglobin: 7.2 g/dL — ABNORMAL LOW (ref 12.0–15.0)
MCH: 27.7 pg (ref 26.0–34.0)
MCHC: 30.3 g/dL (ref 30.0–36.0)
MCV: 91.5 fL (ref 80.0–100.0)
Platelets: 194 10*3/uL (ref 150–400)
RBC: 2.6 MIL/uL — ABNORMAL LOW (ref 3.87–5.11)
RDW: 21 % — ABNORMAL HIGH (ref 11.5–15.5)
WBC: 7.9 10*3/uL (ref 4.0–10.5)
nRBC: 0.3 % — ABNORMAL HIGH (ref 0.0–0.2)

## 2020-12-29 LAB — GLUCOSE, CAPILLARY
Glucose-Capillary: 89 mg/dL (ref 70–99)
Glucose-Capillary: 102 mg/dL — ABNORMAL HIGH (ref 70–99)
Glucose-Capillary: 110 mg/dL — ABNORMAL HIGH (ref 70–99)
Glucose-Capillary: 141 mg/dL — ABNORMAL HIGH (ref 70–99)

## 2020-12-29 LAB — LEGIONELLA PNEUMOPHILA SEROGP 1 UR AG: L. pneumophila Serogp 1 Ur Ag: NEGATIVE

## 2020-12-29 LAB — CEA
CEA: 57.6 ng/mL — ABNORMAL HIGH (ref 0.0–4.7)
CEA: 62.1 ng/mL — ABNORMAL HIGH (ref 0.0–4.7)

## 2020-12-29 MED ORDER — LIVING BETTER WITH HEART FAILURE BOOK: Freq: Once | Status: AC

## 2020-12-29 MED ORDER — OSELTAMIVIR PHOSPHATE 30 MG PO CAPS
30.0000 mg | ORAL_CAPSULE | Freq: Every day | ORAL | Status: DC
  Administered 2020-12-29 – 2020-12-30 (×2): 30 mg via ORAL
  Filled 2020-12-29 (×2): qty 1

## 2020-12-29 MED ORDER — OSELTAMIVIR PHOSPHATE 75 MG PO CAPS
75.0000 mg | ORAL_CAPSULE | Freq: Two times a day (BID) | ORAL | Status: DC
Start: 2020-12-29 — End: 2020-12-29

## 2020-12-29 NOTE — Progress Notes (Signed)
PROGRESS NOTE    Beth Padilla  OZH:[id number] DOB: July 02, 1940 DOA: 12/25/2020 PCP: Kendrick Ranch, MD   Brief Narrative: Beth Padilla is a 80 y.o. female, with history of atrial fibrillation, CHF, coronary artery disease, diabetes mellitus type 2, GERD, hyperlipidemia, hypertension, hypothyroidism. Patient presented secondary to hematochezia with concern for GI bleeding. She was found to have profound symptomatic anemia requiring multiple blood transfusions. GI consulted with plan for upper and lower endoscopies.   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 -GI and oncology input appreciated -Extensive conversations with patient's daughter Nevin Bloodgood as well as patient's son Cecilie Kicks and patient's granddaughter Nestor Lewandowsky -Given overall poor functional status and advanced age patient and family are not sure that they want to pursue aggressive work-up and treatment -Outpatient PET scan advised for staging tentatively scheduled for 01/06/2021 -If patient and family decides to transition to hospice, PET scan can be canceled at that time -If they decide to do full treatment protocols without limitations then they may proceed with PET scan as already scheduled for 01/06/2021 -Patient and family want to have further conversations at home postdischarge prior to making the final decision on which route they would like to take   Acute on chronic anemia due to recurrent GI bleed in the setting of colon malignancy-- -Patient  received 4 units of PRBC this admission -May continue to monitor CBC as outpatient and do palliative transfusions if family and patient desires otherwise transition to full comfort care   AKI on CKD stage IV Patient with some BLE edema and mild pulmonary edema on chest x-ray; on room air. Started on sodium bicarbonate for associated mild metabolic acidosis. Some improvement but mostly stable. Renal ultrasound without obstruction. use Lasix and bicarb if patient does not transition to comfort care and hospice   Pulmonary edema Acute diastolic heart failure Mild. Patient is on Lasix as an outpatient. Last EF of 55-60% from 07/2020 -Treated with IV Lasix,    Multifocal pneumonia -Patient completed -Ceftriaxone/Azithromycin  Hyperkalemia/hypocalcemia Treated and improved/resolved   Depression -Continue Lexapro and Buspar   Atrial fibrillation -Risk versus benefit of anticoagulation discussed with patient and family -Given underlying colon cancer patient is hypercoagulable and at increased risk for stroke and VTE -If patient and family does not transition to full comfort care with hospice she may restart Eliquis -If patient and family decides to transition to full comfort care with hospice then Eliquis should be discontinued   -Acute hypoxic respiratory failure--multifactorial in the setting of CHF and recent pneumonia -Patient remains hypoxic, using 3L/min, she meets criteria for home O2,      Social/Ethics- - patient and family are leaning towards transitioning to full comfort care with hospice bed they want to talk with other family members at home prior to making a final decision     consultants:  Gastroenterology/oncology   Procedures:  COLONOSCOPY (12/27/2020) Impression:               - Preparation of the colon was fair.                           -  Non-bleeding internal hemorrhoids.                           - Diverticulosis in the sigmoid colon and in the                             descending colon.                           - Blood in the entire examined colon.                           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 Biopsy confirms adenocarcinoma  Disposition Plan: Possible discharge home with hospice   Consultants:  Gastroenterology  Antimicrobials: Ceftriaxone Azithromycin    Subjective:  -Granddaughter at bedside, Complains of left arm pain and abdominal discomfort, No emesis, oral intake is not great  Objective: Vitals:   12/29/20 0300 12/29/20 0459 12/29/20 1422 12/29/20 1424  BP:  (!) 141/63 (!) 151/64 (!) 151/64  Pulse:  76 74 74  Resp:  17 19 19   Temp:  98 F (36.7 C) 99.6 F (37.6 C) 99.6 F (37.6 C)  TempSrc:   Oral Oral  SpO2:  100% 100% 100%  Weight: 97.2 kg     Height:        Intake/Output Summary (Last 24 hours) at 12/29/2020 1956 Last data filed at 12/29/2020 1700 Gross per 24 hour  Intake 840 ml  Output 1550 ml  Net -710 ml   Filed Weights   12/27/20 0433 12/28/20 0536 12/29/20 0300  Weight: 94.8 kg 96.4 kg 97.2 kg    Examination:  Gen:- Awake Alert, no acute distress , looks chronically ill HEENT:- [place].AT, No sclera icterus Nose- [place] 3L/min Neck-Supple Neck,No JVD,.  Lungs-  CTAB , good air movement bilaterally  CV- S1, S2 normal, regular Abd-  +ve B.Sounds, Abd Soft, No tenderness,    Extremity/Skin:-Generalized edema,   good pulses Psych-affect is appropriate, oriented x3 Neuro-generalized weakness and deconditioning no new focal deficits, no tremors  Data Reviewed: I have personally reviewed following labs and imaging studies  CBC Lab Results  Component Value Date   WBC 7.9 12/29/2020   RBC 2.60 (L) 12/29/2020   HGB 7.2 (L) 12/29/2020   HCT 23.8 (L) 12/29/2020   MCV 91.5 12/29/2020   MCH 27.7 12/29/2020   PLT 194 12/29/2020   MCHC 30.3 12/29/2020   RDW 21.0 (H) 12/29/2020   LYMPHSABS 0.6 (L) 12/25/2020   MONOABS 0.5  12/25/2020   EOSABS 0.0 12/25/2020   BASOSABS 0.0 10/08/3005     Last metabolic panel Lab Results  Component Value Date   NA 139 12/29/2020   K 4.9 12/29/2020   CL 107 12/29/2020   CO2 22 12/29/2020   BUN 122 (H) 12/29/2020   CREATININE 3.88 (H) 12/29/2020   GLUCOSE 98 12/29/2020   GFRNONAA 11 (L) 12/29/2020   GFRAA 23 (L) 05/04/2019   CALCIUM 7.6 (L) 12/29/2020   PHOS 6.1 (H) 12/29/2020   PROT 5.7 (L) 12/25/2020   ALBUMIN 2.1 (L) 12/29/2020   LABGLOB 3.0 07/25/2020   AGRATIO 0.9 07/25/2020   BILITOT  0.9 12/25/2020   ALKPHOS 62 12/25/2020   AST 22 12/25/2020   ALT 17 12/25/2020   ANIONGAP 10 12/29/2020    CBG (last 3)  Recent Labs    12/29/20 0753 12/29/20 1120 12/29/20 1618  GLUCAP 89 102* 110*     GFR: Estimated Creatinine Clearance: 13.6 mL/min (A) (by C-G formula based on SCr of 3.88 mg/dL (H)).  Coagulation Profile: Recent Labs  Lab 12/25/20 1151  INR 1.6*    Recent Results (from the past 240 hour(s))  Resp Panel by RT-PCR (Flu A&B, Covid) Nasopharyngeal Swab     Status: None   Collection Time: 12/25/20 12:10 AM   Specimen: Nasopharyngeal Swab; Nasopharyngeal(NP) swabs in vial transport medium  Result Value Ref Range Status   SARS Coronavirus 2 by RT PCR NEGATIVE NEGATIVE Final    Comment: (NOTE) SARS-CoV-2 target nucleic acids are NOT DETECTED.  The SARS-CoV-2 RNA is generally detectable in upper respiratory specimens during the acute phase of infection. The lowest concentration of SARS-CoV-2 viral copies this assay can detect is 138 copies/mL. A negative result does not preclude SARS-Cov-2 infection and should not be used as the sole basis for treatment or other patient management decisions. A negative result may occur with  improper specimen collection/handling, submission of specimen other than nasopharyngeal swab, presence of viral mutation(s) within the areas targeted by this assay, and inadequate number of viral copies(<138 copies/mL). A  negative result must be combined with clinical observations, patient history, and epidemiological information. The expected result is Negative.  Fact Sheet for Patients:  EntrepreneurPulse.com.au  Fact Sheet for Healthcare Providers:  IncredibleEmployment.be  This test is no t yet approved or cleared by the [place] FDA and  has been authorized for detection and/or diagnosis of SARS-CoV-2 by FDA under an Emergency Use Authorization (EUA). This EUA will remain  in effect (meaning this test can be used) for the duration of the COVID-19 declaration under Section 564(b)(1) of the Act, 21 U.S.C.section 360bbb-3(b)(1), unless the authorization is terminated  or revoked sooner.       Influenza A by PCR NEGATIVE NEGATIVE Final   Influenza B by PCR NEGATIVE NEGATIVE Final    Comment: (NOTE) The Xpert Xpress SARS-CoV-2/FLU/RSV plus assay is intended as an aid in the diagnosis of influenza from Nasopharyngeal swab specimens and should not be used as a sole basis for treatment. Nasal washings and aspirates are unacceptable for Xpert Xpress SARS-CoV-2/FLU/RSV testing.  Fact Sheet for Patients: EntrepreneurPulse.com.au  Fact Sheet for Healthcare Providers: IncredibleEmployment.be  This test is not yet approved or cleared by the [place] FDA and has been authorized for detection and/or diagnosis of SARS-CoV-2 by FDA under an Emergency Use Authorization (EUA). This EUA will remain in effect (meaning this test can be used) for the duration of the COVID-19 declaration under Section 564(b)(1) of the Act, 21 U.S.C. section 360bbb-3(b)(1), unless the authorization is terminated or revoked.  Performed at [facility], [address] [place], [place] [address]   Expectorated Sputum Assessment w Gram Stain, Rflx to Resp Cult     Status: None   Collection Time: 12/28/20 12:52 PM   Specimen: Expectorated Sputum   Result Value Ref Range Status   Specimen Description EXPECTORATED SPUTUM  Final   Special Requests NONE  Final   Sputum evaluation   Final    Sputum specimen not acceptable for testing.  Please recollect.   Gram Stain Report Called to,Read Back By and Verified With: ASHLEY CRADDOCK @ 1418 ON 12/28/20 C  [facility] Performed at [facility], [address] [place], [place] [address]    Report Status 12/28/2020 FINAL  Final  Respiratory (~20 pathogens) panel by PCR     Status: Abnormal   Collection Time: 12/28/20 12:54 PM   Specimen: Expectorated Sputum; Respiratory  Result Value Ref Range Status   Adenovirus NOT DETECTED NOT DETECTED Final   Coronavirus 229E NOT DETECTED NOT DETECTED Final    Comment: (NOTE) The Coronavirus on the Respiratory Panel, DOES NOT test for the novel  Coronavirus (2019 nCoV)    Coronavirus HKU1 NOT DETECTED NOT DETECTED Final   Coronavirus NL63 NOT DETECTED NOT DETECTED Final   Coronavirus OC43 NOT DETECTED NOT DETECTED Final   Metapneumovirus NOT DETECTED NOT DETECTED Final   Rhinovirus / Enterovirus NOT DETECTED NOT DETECTED Final   Influenza A H1 2009 DETECTED (A) NOT DETECTED Final   Influenza B NOT DETECTED NOT DETECTED Final   Parainfluenza Virus 1 NOT DETECTED NOT DETECTED Final   Parainfluenza Virus 2 NOT DETECTED NOT DETECTED Final   Parainfluenza Virus 3 NOT DETECTED NOT DETECTED Final   Parainfluenza Virus 4 NOT DETECTED NOT DETECTED Final   Respiratory Syncytial Virus NOT DETECTED NOT DETECTED Final   Bordetella pertussis NOT DETECTED NOT DETECTED Final   Bordetella Parapertussis NOT DETECTED NOT DETECTED Final   Chlamydophila pneumoniae NOT DETECTED NOT DETECTED Final   Mycoplasma pneumoniae NOT DETECTED NOT DETECTED Final    Comment: Performed at [facility], [place] [address] [place], [place] [address]         Radiology Studies: US Venous Img Upper Uni Right(DVT)  Result Date: 12/28/2020 CLINICAL DATA:  Right upper extremity  swelling EXAM: RIGHT UPPER EXTREMITY VENOUS DOPPLER ULTRASOUND TECHNIQUE: Gray-scale sonography with graded compression, as well as color Doppler and duplex ultrasound were performed to evaluate the upper extremity deep venous system from the level of the subclavian vein and including the jugular, axillary, basilic, radial, ulnar and upper cephalic vein. Spectral Doppler was utilized to evaluate flow at rest and with distal augmentation maneuvers. COMPARISON:  None. FINDINGS: Contralateral Subclavian Vein: Respiratory phasicity is normal and symmetric with the symptomatic side. No evidence of thrombus. Normal compressibility. Internal Jugular Vein: No evidence of thrombus. Normal compressibility, respiratory phasicity and response to augmentation. Subclavian Vein: No evidence of thrombus. Normal compressibility, respiratory phasicity and response to augmentation. Axillary Vein: No evidence of thrombus. Normal compressibility, respiratory phasicity and response to augmentation. Cephalic Vein: No evidence of thrombus. Normal compressibility, respiratory phasicity and response to augmentation. Basilic Vein: No evidence of thrombus. Normal compressibility, respiratory phasicity and response to augmentation. Brachial Veins: No evidence of thrombus. Normal compressibility, respiratory phasicity and response to augmentation. Radial Veins: No evidence of thrombus. Normal compressibility, respiratory phasicity and response to augmentation. Ulnar Veins: No evidence of thrombus. Normal compressibility, respiratory phasicity and response to augmentation. Venous Reflux:  None visualized. Other Findings: Ill-defined hypoechoic fluid/edema noted throughout the superficial fat and soft tissues. IMPRESSION: No evidence of DVT within the right upper extremity. Extensive superficial edema versus cellulitis. Electronically Signed   By: Jacqulynn Cadet M.D.   On: 12/28/2020 11:51        Scheduled Meds:  atorvastatin  80 mg Oral  QPM   azithromycin  500 mg Oral Daily   busPIRone  10 mg Oral BID   escitalopram  10 mg Oral Daily   ferrous sulfate  325 mg Oral BID   furosemide  40 mg Intravenous BID   insulin aspart  0-9 Units  Subcutaneous TID WC   isosorbide dinitrate  30 mg Oral TID   levothyroxine  125 mcg Oral Daily   oseltamivir  30 mg Oral Daily   pantoprazole  40 mg Intravenous Q12H   sodium bicarbonate  1,300 mg Oral BID   Continuous Infusions:  cefTRIAXone (ROCEPHIN)  IV 2 g (12/29/20 1102)   lactated ringers 50 mL/hr at 12/29/20 1237     LOS: 4 days   Cordelia Poche, MD [facility] 12/29/2020, 7:56 PM  If 7PM-7AM, please contact night-coverage [email or web address]

## 2020-12-29 NOTE — Care Management Important Message (Signed)
Important Message  Patient Details  Name: DOT SPLINTER MRN: [id number] Date of Birth: October 31, 1940   Medicare Important Message Given:  N/A - LOS <3 / Initial given by admissions     [name] 12/29/2020, 1:10 PM

## 2020-12-29 NOTE — TOC Initial Note (Signed)
Transition of Care [facility]) - Initial/Assessment Note    Patient Details  Name: Beth Padilla MRN: [id number] Date of Birth: 1940-10-28  Transition of Care [facility]) CM/SW Contact:    Boneta Lucks, RN Phone Number: 12/29/2020, 3:23 PM  Clinical Narrative: Patient admitted with GI bleed.TOC consulted for CHF screen.Patient states she weighs herself daily and has had discussion about CHF. She has used [facility] in the past and wishes to have RN/PT again. MD updated to place orders.  Patient is interested in educational booklet. TOC order CHF book for patient to have for charting daily weights.                Expected Discharge Plan: [place] Barriers to Discharge: Continued Medical Work up  Patient Goals and CMS Choice Patient states their goals for this hospitalization and ongoing recovery are:: to go home. CMS Medicare.gov Compare Post Acute Care list provided to:: Patient Choice offered to / list presented to : Patient  Expected Discharge Plan and Services Expected Discharge Plan: [place] Arranged: RN, PT [facility] Agency: [facility]    Representative spoke with at [place]: Will fax orders when available  Prior Living Arrangements/Services      Do you feel safe going back to the place where you live?: Yes         Current home services: DME   Activities of Daily Living Home Assistive Devices/Equipment: Wheelchair, Environmental consultant (specify type), Cane (specify quad or straight), Raised toilet seat with rails, Shower chair with back ADL Screening (condition at time of admission) Patient's cognitive ability adequate to safely complete daily activities?: Yes Is the patient deaf or have difficulty hearing?: No Does the patient have difficulty seeing, even when wearing glasses/contacts?: No Does the patient have difficulty concentrating, remembering, or making decisions?: No Patient able to express need for  assistance with ADLs?: Yes Does the patient have difficulty dressing or bathing?: Yes Independently performs ADLs?: No Communication: Independent Dressing (OT): Needs assistance Is this a change from baseline?: Pre-admission baseline Grooming: Independent Feeding: Independent Bathing: Needs assistance Is this a change from baseline?: Pre-admission baseline Toileting: Independent In/Out Bed: Needs assistance Is this a change from baseline?: Pre-admission baseline Walks in Home: Independent with device (comment) Does the patient have difficulty walking or climbing stairs?: Yes Weakness of Legs: Both Weakness of Arms/Hands: Both  Permission Sought/Granted        Emotional Assessment     Affect (typically observed): Accepting Orientation: : Oriented to Self, Oriented to Place, Oriented to  Time, Oriented to Situation Alcohol / Substance Use: Not Applicable Psych Involvement: No (comment)  Admission diagnosis:  GI bleed [K92.2] Acute GI bleeding [K92.2] AKI (acute kidney injury) ([place]) [N17.9] Patient Active Problem List   Diagnosis Date Noted   Influenza A 12/29/2020   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[place])    Liver lesion    GI bleed 12/25/2020   Hypocalcemia 12/25/2020   Severe protein-calorie malnutrition 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) ([place]) 12/25/2020   Symptomatic anemia 09/02/2020   Blood loss anemia 09/02/2020   Epigastric discomfort 08/18/2020   Pancreatic abnormality 08/18/2020   Constipation 08/18/2020   Acute GI bleeding 08/03/2020   H/O: CVA (cerebrovascular accident)-- Has Afib 08/03/2020   CKD (chronic kidney disease), stage IV ([place]) 08/03/2020   Heme positive stool    Black stool    Acute on chronic diastolic congestive heart failure ([place]) 07/22/2020  Acute diastolic CHF (congestive heart failure) ([place]) 07/22/2020   Abdominal pain 05/03/2019   Depression 05/03/2019   Lobar pneumonia ([place]) 02/14/2018   Chronic diastolic HF (heart failure) ([place])  02/13/2018   Anemia of chronic disease 02/13/2018   Uncontrolled type 2 diabetes mellitus with hyperglycemia ([place]) 02/13/2018   Acute respiratory failure with hypoxia ([place]) 02/13/2018   Paroxysmal atrial fibrillation ([place]) 02/19/2015   CVA (cerebral infarction)    Elevated troponin    Stroke ([place]) 07/18/2014   AKI (acute kidney injury) ([place]) 09/16/2013   Bradycardia 09/14/2013   UTI (urinary tract infection) 09/14/2013   Type 2 diabetes with nephropathy ([place]) 12/15/2010   HTN (hypertension) 12/15/2010   Hypothyroidism 12/15/2010   Hyperlipidemia 12/15/2010   PCP:  Kendrick Ranch, MD Pharmacy:   [id number] [place], [place]. [place] [address] Phone: [phone] Fax: [phone]  [facility] ([place], [place] [facility] [address] [place] [address] [place] [address] Phone: [phone] Fax: [phone]  Readmission Risk Interventions Readmission Risk Prevention Plan 12/29/2020 08/04/2020 07/23/2020  Transportation Screening Complete Complete Complete  HRI or Home Care Consult - - Complete  Social Work Consult for [place] Planning/Counseling - - Complete  Palliative Care Screening - - Not Applicable  Medication Review Press photographer) Complete Complete Complete  HRI or Home Care Consult Complete Complete -  SW Recovery Care/Counseling Consult Complete Complete -  Palliative Care Screening Not Applicable Not Applicable -  [place] Not Applicable Not Applicable -  Some recent data might be hidden

## 2020-12-30 ENCOUNTER — Encounter (HOSPITAL_COMMUNITY): Payer: Self-pay

## 2020-12-30 DIAGNOSIS — C189 Malignant neoplasm of colon, unspecified: Secondary | ICD-10-CM

## 2020-12-30 LAB — COMPREHENSIVE METABOLIC PANEL
ALT: 19 U/L (ref 0–44)
AST: 38 U/L (ref 15–41)
Albumin: 2 g/dL — ABNORMAL LOW (ref 3.5–5.0)
Alkaline Phosphatase: 73 U/L (ref 38–126)
Anion gap: 11 (ref 5–15)
BUN: 104 mg/dL — ABNORMAL HIGH (ref 8–23)
CO2: 22 mmol/L (ref 22–32)
Calcium: 7.5 mg/dL — ABNORMAL LOW (ref 8.9–10.3)
Chloride: 106 mmol/L (ref 98–111)
Creatinine, Ser: 3.87 mg/dL — ABNORMAL HIGH (ref 0.44–1.00)
GFR, Estimated: 11 mL/min — ABNORMAL LOW (ref >60)
Glucose, Bld: 100 mg/dL — ABNORMAL HIGH (ref 70–99)
Potassium: 5 mmol/L (ref 3.5–5.1)
Sodium: 139 mmol/L (ref 135–145)
Total Bilirubin: 0.4 mg/dL (ref 0.3–1.2)
Total Protein: 6 g/dL — ABNORMAL LOW (ref 6.5–8.1)

## 2020-12-30 LAB — RENAL FUNCTION PANEL

## 2020-12-30 LAB — CBC
HCT: 25.7 % — ABNORMAL LOW (ref 36.0–46.0)
Hemoglobin: 7.5 g/dL — ABNORMAL LOW (ref 12.0–15.0)
MCH: 26.8 pg (ref 26.0–34.0)
MCHC: 29.2 g/dL — ABNORMAL LOW (ref 30.0–36.0)
MCV: 91.8 fL (ref 80.0–100.0)
Platelets: 215 10*3/uL (ref 150–400)
RBC: 2.8 MIL/uL — ABNORMAL LOW (ref 3.87–5.11)
RDW: 21 % — ABNORMAL HIGH (ref 11.5–15.5)
WBC: 7 10*3/uL (ref 4.0–10.5)
nRBC: 0.3 % — ABNORMAL HIGH (ref 0.0–0.2)

## 2020-12-30 LAB — PHOSPHORUS: Phosphorus: 6 mg/dL — ABNORMAL HIGH (ref 2.5–4.6)

## 2020-12-30 LAB — GLUCOSE, CAPILLARY
Glucose-Capillary: 92 mg/dL (ref 70–99)
Glucose-Capillary: 115 mg/dL — ABNORMAL HIGH (ref 70–99)

## 2020-12-30 MED ORDER — GUAIFENESIN ER 600 MG PO TB12: 600.0000 mg | ORAL_TABLET | Freq: Two times a day (BID) | ORAL | 0 refills | Status: AC

## 2020-12-30 MED ORDER — OSELTAMIVIR PHOSPHATE 30 MG PO CAPS: 30.0000 mg | ORAL_CAPSULE | Freq: Every day | ORAL | 0 refills | Status: AC

## 2020-12-30 MED ORDER — METOLAZONE 5 MG PO TABS
10.0000 mg | ORAL_TABLET | Freq: Once | ORAL | Status: AC
  Administered 2020-12-30: 10 mg via ORAL
  Filled 2020-12-30: qty 2

## 2020-12-30 MED ORDER — ACETAMINOPHEN 325 MG PO TABS: 650.0000 mg | ORAL_TABLET | Freq: Four times a day (QID) | ORAL | 0 refills | Status: AC | PRN

## 2020-12-30 MED ORDER — FUROSEMIDE 10 MG/ML IJ SOLN
60.0000 mg | Freq: Once | INTRAMUSCULAR | Status: AC
  Administered 2020-12-30: 60 mg via INTRAVENOUS
  Filled 2020-12-30: qty 6

## 2020-12-30 MED ORDER — TORSEMIDE 40 MG PO TABS: 40.0000 mg | ORAL_TABLET | Freq: Every day | ORAL | 1 refills | Status: AC

## 2020-12-30 MED ORDER — ALBUTEROL SULFATE (2.5 MG/3ML) 0.083% IN NEBU: 2.5000 mg | INHALATION_SOLUTION | RESPIRATORY_TRACT | 12 refills | Status: AC | PRN

## 2020-12-30 MED ORDER — METOLAZONE 2.5 MG PO TABS: 2.5000 mg | ORAL_TABLET | ORAL | 0 refills | Status: AC

## 2020-12-30 MED ORDER — OXYCODONE HCL 5 MG PO TABS: 5.0000 mg | ORAL_TABLET | ORAL | 0 refills | Status: AC | PRN

## 2020-12-30 MED ORDER — ONDANSETRON HCL 4 MG PO TABS: 4.0000 mg | ORAL_TABLET | Freq: Four times a day (QID) | ORAL | 0 refills | Status: AC | PRN

## 2020-12-30 NOTE — Care Management Important Message (Signed)
Important Message  Patient Details  Name: Beth Padilla MRN: [id number] Date of Birth: 23-Jul-1940   Medicare Important Message Given:  Yes (spoke with patient by telephone at [phone], explained letter, no copy needed.)     [name] 12/30/2020, 1:26 PM

## 2020-12-30 NOTE — Progress Notes (Signed)
Transport has been called for pt

## 2020-12-30 NOTE — Discharge Summary (Addendum)
Beth Padilla, is a 80 y.o. female  DOB 1940/07/25  MRN [id number].  Admission date:  12/25/2020  Admitting Physician  Rolla Plate, DO  Discharge Date:  12/30/2020   Primary MD  Vasireddy, Lanetta Inch, MD  Recommendations for primary care physician for things to follow:   1)you need oxygen at home at 3 L via nasal cannula continuously while awake and while asleep--- smoking or having open fires around oxygen can cause fire, significant injury and death  2)Avoid ibuprofen/Advil/Aleve/Motrin/Goody Powders/Naproxen/BC powders/Meloxicam/Diclofenac/Indomethacin and other Nonsteroidal anti-inflammatory medications as these will make you more likely to bleed and can cause stomach ulcers, can also cause Kidney problems.   3)You will get a call from Hospice Service to discuss options  4)You have been scheduled for a PET Scan on 01/06/21 to see how far your Colon cancer has spread -You have colon cancer confirmed by colonoscopy with Biopsy and Pathology Tests-- You can decide if you want to proceed with this test---Please let Dr Delton Coombes know if you decide Not to do this PET scan Test - hematologist/oncologist Dr. Derek Jack, MD if you so desire -Address: inside [facility] (4th Floor), [place], [place],  [address] Phone: [phone]  5)Repeat CBC and BMP Test on Monday 01/03/2021 if you decide Not to [facility]   Admission Diagnosis  GI bleed [K92.2] Acute GI bleeding [K92.2] AKI (acute kidney injury) ([place]) [N17.9]   Discharge Diagnosis  GI bleed [K92.2] Acute GI bleeding [K92.2] AKI (acute kidney injury) ([place]) [N17.9]    Principal Problem:   GI bleed Active Problems:   Influenza A   HTN (hypertension)   Hypothyroidism   AKI (acute kidney injury) ([place])   Paroxysmal atrial fibrillation ([place])   Blood loss anemia   Hypocalcemia   Severe protein-calorie  malnutrition 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) ([place])   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[place])   Liver lesion      Past Medical History:  Diagnosis Date   Anemia    Atrial fibrillation ([place])    CHF (congestive heart failure) ([place])    Coronary artery disease    Depression    Diabetes mellitus    GERD (gastroesophageal reflux disease)    Hyperlipidemia 12/15/2010   Hypertension    Hypothyroidism 12/15/2010   Pneumonia    Renal disorder    Stroke [facility])     Past Surgical History:  Procedure Laterality Date   ABDOMINAL HYSTERECTOMY     BIOPSY  08/05/2020   Procedure: BIOPSY;  Surgeon: [name], DO;  [facility];  Service: Endoscopy;;   BIOPSY  12/27/2020   Procedure: BIOPSY;  Surgeon: [name], DO;  [facility];  Service: Endoscopy;;   COLONOSCOPY WITH PROPOFOL N/A 12/27/2020   Procedure: COLONOSCOPY WITH PROPOFOL;  Surgeon: [name], DO;  [facility];  Service: Endoscopy;  Laterality: N/A;   EP IMPLANTABLE DEVICE N/A 07/21/2014   Procedure: Loop Recorder Insertion;  Surgeon: [name], MD;  Location: [facility]  INVASIVE CV LAB;  Service: Cardiovascular;  Laterality: N/A;   ESOPHAGOGASTRODUODENOSCOPY (EGD) WITH PROPOFOL N/A 08/05/2020   Procedure: ESOPHAGOGASTRODUODENOSCOPY (EGD) WITH PROPOFOL;  Surgeon: [name], DO;  [facility];  Service: Endoscopy;  Laterality: N/A;   ESOPHAGOGASTRODUODENOSCOPY (EGD) WITH PROPOFOL N/A 09/03/2020   Procedure: ESOPHAGOGASTRODUODENOSCOPY (EGD) WITH PROPOFOL;  Surgeon: [name], MD;  [facility];  Service: Gastroenterology;  Laterality: N/A;   GIVENS CAPSULE STUDY N/A 09/03/2020   Procedure: GIVENS CAPSULE STUDY;  Surgeon: [name], MD;  [facility];  Service: Gastroenterology;  Laterality: N/A;  possible capsule deployment if egd unremarkable   implantable loop recorder removal  10/14/2018   MDT Reveal LINQ removed in  office by Dr [name]   TEE WITHOUT CARDIOVERSION N/A 07/21/2014   Procedure: TRANSESOPHAGEAL ECHOCARDIOGRAM (TEE);  Surgeon: [name], MD;  Location: [facility] ENDOSCOPY;  Service: Cardiovascular;  Laterality: N/A;     HPI  from the history and physical done on the day of admission:     Beth Padilla  is a 80 y.o. female, with history of atrial fibrillation, CHF, coronary artery disease, diabetes mellitus type 2, GERD, hyperlipidemia, hypertension, hypothyroidism, and more presents the ED with a chief complaint of blood per rectum.  Patient is alert and oriented x3, but she is a poor historian when it comes to the details.  Patient reports that she had blood in her stool today.  She reports that she had not seen red blood in her stool prior to today.  She reports it was 1 time.  She does report melena but its been going on for an unknown period of time.  Patient reports that she is on iron pills that she can pain attention to the melena.  She denies stomach pain, nausea, vomiting, decrease in appetite, shortness of breath.  Patient does report an increase in peripheral edema.  She reports that that started today as well.  She has no chest pain.  Patient reports that she has had of dry occasional cough and congestion has been ongoing for an unknown period of time.  She thought that it was just a head cold.  She has not had a fever.  She is compliant with all of her medications.  Patient reports that she does feel fatigued and feels some generalized weakness.  She uses a wheelchair or cane to ambulate at home since her stroke that she had several years ago.  Patient has no pain at this time.  Patient denies any decrease in urine output or change in urine habits.  Patient has no other complaints at this time.   Patient does not smoke, does not drink alcohol.  She is vaccinated for COVID.  Patient is full code.   In the ED Temp 97.1-97.6, heart rate 74-100, respiratory rate 13-19, blood pressure 130/67,  satting at 96% No leukocytosis with a white blood cell count of 5.8, hemoglobin 4.4 Potassium slightly elevated at 5.7, decreased bicarb at 19, elevated creatinine at 4.05, elevated BUN at 143, elevated BNP at 695, normal troponin 4, repeat pending Negative COVID and flu Chest x-ray shows cardiomegaly with mild interstitial edema and small layering bilateral pleural effusions Nephro was consulted and advised to give 120 mg of IV Lasix if patient develops dyspnea with blood transfusions     Chart review Reveals them to series was done in August 2022.  They found gastritis on the scope.  They recommended continuing PPI twice daily.  There was  normal duodenum exam, no specimens were taken.  This was apparently done for similar presentation. Patient is on Eliquis for atrial fibrillation.     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 -GI and oncology input appreciated -Extensive conversations with patient's daughter Beth Padilla as well as patient's son Beth Padilla and patient's granddaughter Beth Padilla -Given overall poor functional status and advanced age patient and family are not sure that they want to pursue aggressive work-up and treatment -Outpatient PET scan advised for staging tentatively scheduled for 01/06/2021 -If patient and family decides to transition to hospice, PET scan can be canceled at that time -If they decide to do full treatment protocols without limitations then they may proceed with PET scan as already scheduled for 01/06/2021 -Patient and family want to have further conversations at home postdischarge prior to making the final decision on which route they would like to take  Acute on chronic anemia due to recurrent GI bleed in the setting of colon malignancy-- -Patient received 4 units of PRBC this admission -May continue to monitor CBC as outpatient and do palliative  transfusions if family and patient desires otherwise transition to full comfort care   AKI on CKD stage IV Patient with some BLE edema and mild pulmonary edema on chest x-ray; on room air. Started on sodium bicarbonate for associated mild metabolic acidosis. Some improvement but mostly stable. Renal ultrasound without obstruction. -Okay to use Lasix and bicarb if patient does not transition to comfort care and hospice  Pulmonary edema Acute diastolic heart failure Mild. Patient is on Lasix as an outpatient. Last EF of 55-60% from 07/2020 -Treated with IV Lasix, okay to discharge on p.o. torsemide  Multifocal pneumonia -Patient completed -Ceftriaxone/Azithromycin -Overall improved    Hyperkalemia/hypocalcemia Treated and improved/resolved   Depression -Continue Lexapro and Buspar   Atrial fibrillation -Risk versus benefit of anticoagulation discussed with patient and family -Given underlying colon cancer patient is hypercoagulable and at increased risk for stroke and VTE -If patient and family does not transition to full comfort care with hospice she may restart Eliquis -If patient and family decides to transition to full comfort care with hospice then Eliquis should be discontinued  -Acute hypoxic respiratory failure--multifactorial in the setting of CHF and recent pneumonia -Patient remains hypoxic, she meets criteria for home O2, home oxygen prescribed at discharge at 3L/min     Social/Ethics- CODE STATUS and advanced directive discussed with patient and daughter Beth Padilla at bedside, they request DNR status   Disposition Plan: Discharge home patient and family are leaning towards transitioning to full comfort care with hospice bed they want to talk with other family members at home prior to making a final decision -TOC made hospice referral for patient   consultants:  Gastroenterology/oncology   Procedures:  COLONOSCOPY (12/27/2020) Impression:               - Preparation of  the colon was fair.                           - Non-bleeding internal hemorrhoids.                           - Diverticulosis in the sigmoid colon and in the  descending colon.                           - Blood in the entire examined colon.                           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 Biopsy confirms adenocarcinoma  Discharge Condition: Overall prognosis is poor  Follow UP--- outpatient follow-up with hospice Versus oncology as patient and family desires  Diet and Activity recommendation:  As advised  Discharge Instructions    Discharge Instructions     Call MD for:  difficulty breathing, headache or visual disturbances   Complete by: As directed    Call MD for:  persistant dizziness or light-headedness   Complete by: As directed    Call MD for:  persistant nausea and vomiting   Complete by: As directed    Call MD for:  temperature >100.4   Complete by: As directed    Diet - low sodium heart healthy   Complete by: As directed    Discharge instructions   Complete by: As directed    1)you need oxygen at home at 3 L via nasal cannula continuously while awake and while asleep--- smoking or having open fires around oxygen can cause fire, significant injury and death  2)Avoid ibuprofen/Advil/Aleve/Motrin/Goody Powders/Naproxen/BC powders/Meloxicam/Diclofenac/Indomethacin and other Nonsteroidal anti-inflammatory medications as these will make you more likely to bleed and can cause stomach ulcers, can also cause Kidney problems.   3)You will get a call from Hospice Service to discuss options  4)You have been scheduled for a PET Scan on 01/06/21 to see how far your Colon cancer has spread -You have colon cancer confirmed by colonoscopy with Biopsy and Pathology Tests-- You can decide if you want to proceed with this test---Please let Dr Delton Coombes know if you decide Not to do  this PET scan Test - hematologist/oncologist Dr. Derek Jack, MD if you so desire -Address: inside [facility] (4th Floor), [place], [place], [place] [address] Phone: [phone]  5)Repeat CBC and BMP Test on Monday 01/03/2021 if you decide Not to [facility]   For home use only DME Nebulizer machine   Complete by: As directed    Patient needs a nebulizer to treat with the following condition: COPD with acute exacerbation ([place])   Length of Need: Lifetime   Increase activity slowly   Complete by: As directed         Discharge Medications     Allergies as of 12/30/2020       Reactions   Ampicillin Rash        Medication List     STOP taking these medications    acetaminophen 650 MG CR tablet Commonly known as: TYLENOL Replaced by: acetaminophen 325 MG tablet   alum & mag hydroxide-simeth 200-200-20 MG/5ML suspension Commonly known as: MAALOX/MYLANTA   atorvastatin 80 MG tablet Commonly known as: LIPITOR   furosemide 20 MG tablet Commonly known as: LASIX   valsartan 80 MG tablet Commonly known as: DIOVAN       TAKE these medications    acetaminophen 325 MG tablet Commonly known as: TYLENOL Take 2 tablets (650 mg total) by mouth every 6 (  six) hours as needed for mild pain (or Fever >/= 101). Replaces: acetaminophen 650 MG CR tablet   albuterol (2.5 MG/3ML) 0.083% nebulizer solution Commonly known as: PROVENTIL Take 3 mLs (2.5 mg total) by nebulization every 4 (four) hours as needed for wheezing or shortness of breath.   amLODipine 5 MG tablet Commonly known as: NORVASC Take 5 mg by mouth daily.   apixaban 2.5 MG Tabs tablet Commonly known as: ELIQUIS Take 2.5 mg by mouth 2 (two) times daily.   busPIRone 10 MG tablet Commonly known as: BUSPAR Take 10 mg by mouth 2 (two) times daily.   [employer] Diclofenac Sodium 1 % Gel Generic drug: diclofenac Sodium Apply 2 g topically 4 (four) times daily as needed (pain).   escitalopram 10  MG tablet Commonly known as: LEXAPRO Take 10 mg by mouth daily.   ferrous sulfate 325 (65 FE) MG tablet Take 1 tablet by mouth 2 (two) times daily.   gabapentin 300 MG capsule Commonly known as: NEURONTIN Take 1 capsule by mouth daily as needed (nerve pain).   guaiFENesin 600 MG 12 hr tablet Commonly known as: Mucinex Take 1 tablet (600 mg total) by mouth 2 (two) times daily for 10 days.   isosorbide dinitrate 30 MG tablet Commonly known as: ISORDIL Take 30 mg by mouth 3 (three) times daily.   levothyroxine 125 MCG tablet Commonly known as: SYNTHROID Take 125 mcg by mouth daily.   metolazone 2.5 MG tablet Commonly known as: ZAROXOLYN Take 1 tablet (2.5 mg total) by mouth every Monday, Wednesday, and Friday. Start taking on: December 31, 2020   ondansetron 4 MG tablet Commonly known as: ZOFRAN Take 1 tablet (4 mg total) by mouth every 6 (six) hours as needed for nausea.   oseltamivir 30 MG capsule Commonly known as: TAMIFLU Take 1 capsule (30 mg total) by mouth daily for 3 days. Start taking on: December 31, 2020   oxyCODONE 5 MG immediate release tablet Commonly known as: Oxy IR/ROXICODONE Take 1 tablet (5 mg total) by mouth every 4 (four) hours as needed for moderate pain.   pantoprazole 40 MG tablet Commonly known as: PROTONIX TAKE 1 TABLET BY MOUTH TWICE A DAY What changed:  how to take this when to take this   sodium bicarbonate 650 MG tablet Take 1,300 mg by mouth 2 (two) times daily.   Torsemide 40 MG Tabs Take 40 mg by mouth daily.   vitamin A & D ointment Apply 1 application topically as needed for dry skin.               Durable Medical Equipment  (From admission, onward)           Start     Ordered   12/30/20 1523  For home use only DME oxygen  Once       Comments: SATURATION QUALIFICATIONS: (This note is used to comply with regulatory documentation for home oxygen)   Patient Saturations on Room Air at Rest =  87 %   Patient  Saturations on Room Air while moving around in bed = 85 %   Patient Saturations on 3 Liters of oxygen while moving around in Bed  = 93 %     Patient needs continuous O2 at 3 L/min continuously via nasal cannula with humidifier, with gaseous portability and conserving device    Diagnosis ---Diastolic Dysfunction Congestive Heart Failure/HFpEF  Question Answer Comment  Length of Need Lifetime   Mode or (Route) Nasal cannula   Liters  per Minute 3   Frequency Continuous (stationary and portable oxygen unit needed)   Oxygen conserving device Yes   Oxygen delivery system Gas      12/30/20 1522   12/30/20 0000  For home use only DME Nebulizer machine       Question Answer Comment  Patient needs a nebulizer to treat with the following condition COPD with acute exacerbation ([place])   Length of Need Lifetime      12/30/20 1542            Major procedures and Radiology Reports - PLEASE review detailed and final reports for all details, in brief -   US RENAL  Result Date: 12/26/2020 CLINICAL DATA:  Proteinaceous cyst in the right kidney, seen on prior CT examination EXAM: RENAL / URINARY TRACT ULTRASOUND COMPLETE COMPARISON:  CT examination dated May 03, 2019 FINDINGS: Right Kidney: Renal measurements: 10.0 x 4.3 x 4.5 = volume: 100 mL. Echogenic renal cortical echotexture. Complex avascular cyst in the midpole measuring 1.9 x 2.0 x 2.1 cm. Left Kidney: Not clearly visualized due to bowel gas shadowing. Bladder: Appears normal for degree of bladder distention. Other: None. IMPRESSION: 1. No evidence of right nephrolithiasis or hydronephrosis. Anechoic avascular structure in the midpole of the right kidney, likely cyst. 2.  Left kidney not visualized. 3.  Urinary bladder is unremarkable. Electronically Signed   By: Keane Police D.O.   On: 12/26/2020 11:01   US Venous Img Upper Uni Right(DVT)  Result Date: 12/28/2020 CLINICAL DATA:  Right upper extremity swelling EXAM: RIGHT UPPER EXTREMITY  VENOUS DOPPLER ULTRASOUND TECHNIQUE: Gray-scale sonography with graded compression, as well as color Doppler and duplex ultrasound were performed to evaluate the upper extremity deep venous system from the level of the subclavian vein and including the jugular, axillary, basilic, radial, ulnar and upper cephalic vein. Spectral Doppler was utilized to evaluate flow at rest and with distal augmentation maneuvers. COMPARISON:  None. FINDINGS: Contralateral Subclavian Vein: Respiratory phasicity is normal and symmetric with the symptomatic side. No evidence of thrombus. Normal compressibility. Internal Jugular Vein: No evidence of thrombus. Normal compressibility, respiratory phasicity and response to augmentation. Subclavian Vein: No evidence of thrombus. Normal compressibility, respiratory phasicity and response to augmentation. Axillary Vein: No evidence of thrombus. Normal compressibility, respiratory phasicity and response to augmentation. Cephalic Vein: No evidence of thrombus. Normal compressibility, respiratory phasicity and response to augmentation. Basilic Vein: No evidence of thrombus. Normal compressibility, respiratory phasicity and response to augmentation. Brachial Veins: No evidence of thrombus. Normal compressibility, respiratory phasicity and response to augmentation. Radial Veins: No evidence of thrombus. Normal compressibility, respiratory phasicity and response to augmentation. Ulnar Veins: No evidence of thrombus. Normal compressibility, respiratory phasicity and response to augmentation. Venous Reflux:  None visualized. Other Findings: Ill-defined hypoechoic fluid/edema noted throughout the superficial fat and soft tissues. IMPRESSION: No evidence of DVT within the right upper extremity. Extensive superficial edema versus cellulitis. Electronically Signed   By: Jacqulynn Cadet M.D.   On: 12/28/2020 11:51   DG Chest Port 1 View  Result Date: 12/25/2020 CLINICAL DATA:  Edema EXAM: PORTABLE  CHEST 1 VIEW COMPARISON:  09/02/2020 FINDINGS: Cardiomegaly with mild interstitial edema. Layering small bilateral pleural effusions, right greater than left, improved. No pneumothorax. Thoracic aortic atherosclerosis. IMPRESSION: Cardiomegaly with mild interstitial edema and small layering bilateral pleural effusions, right greater than left. Electronically Signed   By: Julian Hy M.D.   On: 12/25/2020 01:38   ECHOCARDIOGRAM COMPLETE  Result Date: 12/26/2020    ECHOCARDIOGRAM  REPORT   Patient Name:   Beth Padilla Date of Exam: 12/26/2020 Medical Rec #:  [id number]          Height:       67.0 in Accession #:    [id number]         Weight:       168.0 lb Date of Birth:  1940/07/28          BSA:          1.878 m Patient Age:    49 years           BP:           170/62 mmHg Patient Gender: F                  HR:           62 bpm. Exam Location:  [facility] Procedure: 2D Echo, Color Doppler and Cardiac Doppler Indications:    CHF  History:        Patient has prior history of Echocardiogram examinations, most                 recent 06/22/2020. Stroke, Arrythmias:Atrial Fibrillation; Risk                 Factors:Diabetes, Dyslipidemia and Non-Smoker.  Sonographer:    Leavy Cella RDCS Referring Phys: [id number] ASIA B [place]  1. Left ventricular ejection fraction, by estimation, is 60 to 65%. The left ventricle has normal function. The left ventricle has no regional wall motion abnormalities. Left ventricular diastolic function could not be evaluated. There is the interventricular septum is flattened in systole and diastole, consistent with right ventricular pressure and volume overload.  2. Right ventricular systolic function is mildly reduced. The right ventricular size is mildly enlarged. There is severely elevated pulmonary artery systolic pressure. The estimated right ventricular systolic pressure is 54.5 mmHg.  3. Left atrial size was severely dilated.  4. Right atrial size was mild to  moderately dilated.  5. The mitral valve is degenerative. Mild mitral valve regurgitation. No evidence of mitral stenosis.  6. Tricuspid valve regurgitation is mild to moderate.  7. The aortic valve is tricuspid. There is moderate calcification of the aortic valve. There is moderate thickening of the aortic valve. Aortic valve regurgitation is mild. Moderate aortic valve stenosis. Aortic valve area, by VTI measures 1.07 cm. Aortic valve mean gradient measures 24.0 mmHg. Aortic valve Vmax measures 2.96 m/s.  8. The inferior vena cava is dilated in size with <50% respiratory variability, suggesting right atrial pressure of 15 mmHg. Comparison(s): Changes from prior study are noted. RVSP is now severely elevated. Moderate AS remains. LV function unchanged. FINDINGS  Left Ventricle: Left ventricular ejection fraction, by estimation, is 60 to 65%. The left ventricle has normal function. The left ventricle has no regional wall motion abnormalities. The left ventricular internal cavity size was normal in size. There is  no left ventricular hypertrophy. The interventricular septum is flattened in systole and diastole, consistent with right ventricular pressure and volume overload. Left ventricular diastolic function could not be evaluated due to atrial fibrillation. Left ventricular diastolic function could not be evaluated. Right Ventricle: The right ventricular size is mildly enlarged. No increase in right ventricular wall thickness. Right ventricular systolic function is mildly reduced. There is severely elevated pulmonary artery systolic pressure. The tricuspid regurgitant velocity is 3.78 m/s, and with an assumed right atrial pressure of 15 mmHg, the estimated right ventricular systolic  pressure is 72.2 mmHg. Left Atrium: Left atrial size was severely dilated. Right Atrium: Right atrial size was mild to moderately dilated. Pericardium: There is no evidence of pericardial effusion. Mitral Valve: The mitral valve is  degenerative in appearance. Mild mitral valve regurgitation. No evidence of mitral valve stenosis. Tricuspid Valve: The tricuspid valve is grossly normal. Tricuspid valve regurgitation is mild to moderate. No evidence of tricuspid stenosis. Aortic Valve: The aortic valve is tricuspid. There is moderate calcification of the aortic valve. There is moderate thickening of the aortic valve. Aortic valve regurgitation is mild. Aortic regurgitation PHT measures 617 msec. Moderate aortic stenosis is present. Aortic valve mean gradient measures 24.0 mmHg. Aortic valve peak gradient measures 35.0 mmHg. Aortic valve area, by VTI measures 1.07 cm. Pulmonic Valve: The pulmonic valve was grossly normal. Pulmonic valve regurgitation is not visualized. No evidence of pulmonic stenosis. Aorta: The aortic root and ascending aorta are structurally normal, with no evidence of dilitation. Venous: The inferior vena cava is dilated in size with less than 50% respiratory variability, suggesting right atrial pressure of 15 mmHg. IAS/Shunts: The atrial septum is grossly normal.  LEFT VENTRICLE PLAX 2D LVIDd:         4.20 cm   Diastology LVIDs:         2.70 cm   LV e' medial:    4.35 cm/s LV PW:         1.50 cm   LV E/e' medial:  34.0 LV IVS:        0.90 cm   LV e' lateral:   5.70 cm/s LVOT diam:     1.98 cm   LV E/e' lateral: 26.0 LV SV:         83 LV SV Index:   44 LVOT Area:     3.08 cm  RIGHT VENTRICLE RV S prime:     6.90 cm/s TAPSE (M-mode): 1.6 cm LEFT ATRIUM              Index        RIGHT ATRIUM           Index LA diam:        4.20 cm  2.24 cm/m   RA Area:     19.60 cm LA Vol (A2C):   84.3 ml  44.89 ml/m  RA Volume:   65.90 ml  35.09 ml/m LA Vol (A4C):   101.0 ml 53.79 ml/m LA Biplane Vol: 93.6 ml  49.85 ml/m  AORTIC VALVE AV Area (Vmax):    1.15 cm AV Area (Vmean):   1.01 cm AV Area (VTI):     1.07 cm AV Vmax:           296.00 cm/s AV Vmean:          234.000 cm/s AV VTI:            0.779 m AV Peak Grad:      35.0 mmHg AV  Mean Grad:      24.0 mmHg LVOT Vmax:         111.00 cm/s LVOT Vmean:        76.900 cm/s LVOT VTI:          0.270 m LVOT/AV VTI ratio: 0.35 AI PHT:            617 msec AR Vena Contracta: 0.50 cm  AORTA Ao Root diam: 2.60 cm Ao Asc diam:  3.30 cm MITRAL VALVE  TRICUSPID VALVE MV Area (PHT): 3.99 cm     TR Peak grad:   57.2 mmHg MV Decel Time: 190 msec     TR Vmax:        378.00 cm/s MR Peak grad: 96.4 mmHg MR Vmax:      491.00 cm/s   SHUNTS MV E velocity: 148.00 cm/s  Systemic VTI:  0.27 m MV A velocity: 49.60 cm/s   Systemic Diam: 1.98 cm MV E/A ratio:  2.98 Beth Chiquito MD Electronically signed by Beth Chiquito MD Signature Date/Time: 12/26/2020/2:02:33 PM    Final    CT CHEST ABDOMEN PELVIS WO CONTRAST  Result Date: 12/27/2020 CLINICAL DATA:  Colon mass, for staging.  GI bleed. EXAM: CT CHEST, ABDOMEN AND PELVIS WITHOUT CONTRAST TECHNIQUE: Multidetector CT imaging of the chest, abdomen and pelvis was performed following the standard protocol without IV contrast. COMPARISON:  CT abdomen/pelvis dated 05/03/2019. CT chest dated 02/13/2018. FINDINGS: Markedly limited evaluation due to lack of intravenous contrast administration, motion degradation, quantum mottle artifact, and streak artifact from the patient's arms. CT CHEST FINDINGS Cardiovascular: Heart is normal in size.  No pericardial effusion. Atherosclerotic calcifications of the arch. No evidence of thoracic aortic aneurysm. Coronary atherosclerosis of the LAD. Mediastinum/Nodes: No suspicious mediastinal lymphadenopathy. Visualized thyroid is grossly unremarkable. Lungs/Pleura: Multifocal patchy/nodular opacities in the upper lobes with patchy opacities in the lingula, posterior right middle lobe, and bilateral lower lobes. This appearance favors multifocal pneumonia. Small bilateral pleural effusions with associated compressive atelectasis in the right lower lobe. Evaluation of the lung parenchyma is constrained by respiratory motion.  Within that constraint, there are no suspicious pulmonary nodules. No pleural effusion or pneumothorax. Musculoskeletal: Degenerative changes of the thoracic spine. CT ABDOMEN PELVIS FINDINGS Hepatobiliary: Evaluation of the liver is markedly motion degraded. However, there are suspected hypodense liver lesions which are new from the prior, including a dominant 2.4 cm lesion in the central right liver (series 2/image 50), suspicious for hepatic metastases in the setting known colon mass. Gallbladder is poorly visualized but there are suspected layering gallstones (series 2/image 66). No intrahepatic or extrahepatic ductal dilatation. Pancreas: Grossly unremarkable. Spleen: Within normal limits. Adrenals/Urinary Tract: Adrenal glands are unremarkable. Kidneys within normal limits.  No hydronephrosis. Mildly thick-walled bladder, although underdistended. Stomach/Bowel: Stomach is within normal limits. No evidence of bowel obstruction. Appendix is not discretely visualized. Possible mild masslike wall thickening in the right colon (series 2/image 83), poorly visualized/evaluated. Left colon is underdistended. Vascular/Lymphatic: No evidence of abdominal aortic aneurysm. Atherosclerotic calcifications of the abdominal aorta and branch vessels. No gross abdominopelvic lymphadenopathy. Reproductive: Status post hysterectomy. No adnexal masses. Other: Small volume pelvic ascites. Body wall edema/anasarca. Musculoskeletal: Degenerative changes of the lumbar spine. IMPRESSION: Markedly limited evaluation, as described above. Possible mild masslike wall thickening in the right colon, poorly visualized/evaluated. Correlate with recent colonoscopy. Suspected hepatic metastases, poorly evaluated. Multifocal patchy/nodular opacities in the lungs bilaterally, favoring multifocal pneumonia. Small bilateral pleural effusions. Given the limitations of the current study, repeat CT chest abdomen pelvis with contrast or PET-CT is  suggested in 4-6 weeks, as an outpatient, for staging. Electronically Signed   By: Julian Hy M.D.   On: 12/27/2020 19:33    Micro Results  Recent Results (from the past 240 hour(s))  Resp Panel by RT-PCR (Flu A&B, Covid) Nasopharyngeal Swab     Status: None   Collection Time: 12/25/20 12:10 AM   Specimen: Nasopharyngeal Swab; Nasopharyngeal(NP) swabs in vial transport medium  Result Value Ref Range Status  SARS Coronavirus 2 by RT PCR NEGATIVE NEGATIVE Final    Comment: (NOTE) SARS-CoV-2 target nucleic acids are NOT DETECTED.  The SARS-CoV-2 RNA is generally detectable in upper respiratory specimens during the acute phase of infection. The lowest concentration of SARS-CoV-2 viral copies this assay can detect is 138 copies/mL. A negative result does not preclude SARS-Cov-2 infection and should not be used as the sole basis for treatment or other patient management decisions. A negative result may occur with  improper specimen collection/handling, submission of specimen other than nasopharyngeal swab, presence of viral mutation(s) within the areas targeted by this assay, and inadequate number of viral copies(<138 copies/mL). A negative result must be combined with clinical observations, patient history, and epidemiological information. The expected result is Negative.  Fact Sheet for Patients:  EntrepreneurPulse.com.au  Fact Sheet for Healthcare Providers:  IncredibleEmployment.be  This test is no t yet approved or cleared by the [place] FDA and  has been authorized for detection and/or diagnosis of SARS-CoV-2 by FDA under an Emergency Use Authorization (EUA). This EUA will remain  in effect (meaning this test can be used) for the duration of the COVID-19 declaration under Section 564(b)(1) of the Act, 21 U.S.C.section 360bbb-3(b)(1), unless the authorization is terminated  or revoked sooner.       Influenza A by PCR NEGATIVE  NEGATIVE Final   Influenza B by PCR NEGATIVE NEGATIVE Final    Comment: (NOTE) The Xpert Xpress SARS-CoV-2/FLU/RSV plus assay is intended as an aid in the diagnosis of influenza from Nasopharyngeal swab specimens and should not be used as a sole basis for treatment. Nasal washings and aspirates are unacceptable for Xpert Xpress SARS-CoV-2/FLU/RSV testing.  Fact Sheet for Patients: EntrepreneurPulse.com.au  Fact Sheet for Healthcare Providers: IncredibleEmployment.be  This test is not yet approved or cleared by the [place] FDA and has been authorized for detection and/or diagnosis of SARS-CoV-2 by FDA under an Emergency Use Authorization (EUA). This EUA will remain in effect (meaning this test can be used) for the duration of the COVID-19 declaration under Section 564(b)(1) of the Act, 21 U.S.C. section 360bbb-3(b)(1), unless the authorization is terminated or revoked.  Performed at [facility], [address] [place], [place] [address]   Expectorated Sputum Assessment w Gram Stain, Rflx to Resp Cult     Status: None   Collection Time: 12/28/20 12:52 PM   Specimen: Expectorated Sputum  Result Value Ref Range Status   Specimen Description EXPECTORATED SPUTUM  Final   Special Requests NONE  Final   Sputum evaluation   Final    Sputum specimen not acceptable for testing.  Please recollect.   Gram Stain Report Called to,Read Back By and Verified With: ASHLEY CRADDOCK @ 1914 ON 12/28/20 Sandy Salaam Performed at [facility], [address], [place], [place] [address]    Report Status 12/28/2020 FINAL  Final  Respiratory (~20 pathogens) panel by PCR     Status: Abnormal   Collection Time: 12/28/20 12:54 PM   Specimen: Expectorated Sputum; Respiratory  Result Value Ref Range Status   Adenovirus NOT DETECTED NOT DETECTED Final   Coronavirus 229E NOT DETECTED NOT DETECTED Final    Comment: (NOTE) The Coronavirus on the Respiratory Panel, DOES  NOT test for the novel  Coronavirus (2019 nCoV)    Coronavirus HKU1 NOT DETECTED NOT DETECTED Final   Coronavirus NL63 NOT DETECTED NOT DETECTED Final   Coronavirus OC43 NOT DETECTED NOT DETECTED Final   Metapneumovirus NOT DETECTED NOT DETECTED Final   Rhinovirus /  Enterovirus NOT DETECTED NOT DETECTED Final   Influenza A H1 2009 DETECTED (A) NOT DETECTED Final   Influenza B NOT DETECTED NOT DETECTED Final   Parainfluenza Virus 1 NOT DETECTED NOT DETECTED Final   Parainfluenza Virus 2 NOT DETECTED NOT DETECTED Final   Parainfluenza Virus 3 NOT DETECTED NOT DETECTED Final   Parainfluenza Virus 4 NOT DETECTED NOT DETECTED Final   Respiratory Syncytial Virus NOT DETECTED NOT DETECTED Final   Bordetella pertussis NOT DETECTED NOT DETECTED Final   Bordetella Parapertussis NOT DETECTED NOT DETECTED Final   Chlamydophila pneumoniae NOT DETECTED NOT DETECTED Final   Mycoplasma pneumoniae NOT DETECTED NOT DETECTED Final    Comment: Performed at [facility], [place] [address] [place], [place] [address]       Today   Subjective    Elener Custodio today has no new complaints  - Patient generalized weakness and deconditioning -Denies any significant abdominal pain -Patient's daughter at bedside, questions answered        -No further rectal bleeding at this time  Patient has been seen and examined prior to discharge   Objective   Blood pressure (!) 152/61, pulse 81, temperature 98.3 F (36.8 C), temperature source Oral, resp. rate 18, height 5\' 7"  (1.702 m), weight 94.3 kg, SpO2 90 %.   Intake/Output Summary (Last 24 hours) at 12/30/2020 1542 Last data filed at 12/30/2020 0900 Gross per 24 hour  Intake 720 ml  Output 1750 ml  Net -1030 ml    Exam Gen:- Awake Alert, no acute distress , looks chronically ill HEENT:- [place].AT, No sclera icterus Nose- [place] 3L/min Neck-Supple Neck,No JVD,.  Lungs-  CTAB , good air movement bilaterally  CV- S1, S2 normal, regular Abd-  +ve  B.Sounds, Abd Soft, No tenderness,    Extremity/Skin:-Generalized edema,   good pulses Psych-affect is appropriate, oriented x3 Neuro-generalized weakness and deconditioning no new focal deficits, no tremors    Data Review   CBC w Diff:  Lab Results  Component Value Date   WBC 7.0 12/30/2020   HGB 7.5 (L) 12/30/2020   HCT 25.7 (L) 12/30/2020   PLT 215 12/30/2020   LYMPHOPCT 8 12/25/2020   MONOPCT 6 12/25/2020   EOSPCT 0 12/25/2020   BASOPCT 0 12/25/2020    CMP:  Lab Results  Component Value Date   NA 139 12/30/2020   K 5.0 12/30/2020   CL 106 12/30/2020   CO2 22 12/30/2020   BUN 104 (H) 12/30/2020   CREATININE 3.87 (H) 12/30/2020   PROT 6.0 (L) 12/30/2020   ALBUMIN 2.0 (L) 12/30/2020   BILITOT 0.4 12/30/2020   ALKPHOS 73 12/30/2020   AST 38 12/30/2020   ALT 19 12/30/2020  .   Total Discharge time is about 33 minutes  Roxan Hockey M.D on 12/30/2020 at 3:42 PM  Go to [email or web address] -  for contact info  [facility] - Office  [phone]

## 2020-12-30 NOTE — TOC Transition Note (Addendum)
Transition of Care [facility]) - CM/SW Discharge Note   Patient Details  Name: Beth Padilla MRN: [id number] Date of Birth: May 16, 1940  Transition of [facility]) CM/SW Contact:  Boneta Lucks, RN Phone Number: 12/30/2020, 4:10 PM   Clinical Narrative:   Patient is discharging home. Nevin Bloodgood is requesting [facility] referral.  TOC faxed packet to [phone].  Patient is needing oxygen and EMS transport home.  Caryl Pina with [facility] accepted the referral. Updated that MD also ordered a Neb machine.   Addendum:  [facility] can not accept any patients.   TOC sent referral to Nellie at [facility].  Faxed to  [phone] her cell number is [phone].  Final next level of care: Home w Hospice Care Barriers to Discharge: Barriers Resolved   Patient Goals and CMS Choice Patient states their goals for this hospitalization and ongoing recovery are:: to go home. CMS Medicare.gov Compare Post Acute Care list provided to:: Patient Choice offered to / list presented to : Patient  Discharge Placement              Patient chooses bed at:  [facility])   Name of family member notified: Nevin Bloodgood Patient and family notified of of transfer: 12/30/20  Discharge Plan and Services               DME Arranged: Oxygen, Nebulizer/meds DME Agency: [facility] Date DME Agency Contacted: 12/30/20 Time DME Agency Contacted: [phone] Representative spoke with at DME Agency: Early: RN, PT [place] Agency: [facility]     Representative spoke with at [place]: Will fax orders when available   Readmission Risk Interventions Readmission Risk Prevention Plan 12/30/2020 12/29/2020 08/04/2020  Transportation Screening Complete Complete Complete  HRI or [place] Work Consult for [place] Planning/Counseling - - -  [place] - - -  Medication Review Press photographer) Complete Complete Complete  HRI or [place] -  Complete Complete  SW Recovery Care/Counseling Consult - Complete Complete  Palliative Care Screening - Not Applicable Not Applicable  Skilled Nursing Facility - Not Applicable Not Applicable  Some recent data might be hidden

## 2020-12-30 NOTE — Discharge Instructions (Signed)
1)you need oxygen at home at 3 L via nasal cannula continuously while awake and while asleep--- smoking or having open fires around oxygen can cause fire, significant injury and death  2)Avoid ibuprofen/Advil/Aleve/Motrin/Goody Powders/Naproxen/BC powders/Meloxicam/Diclofenac/Indomethacin and other Nonsteroidal anti-inflammatory medications as these will make you more likely to bleed and can cause stomach ulcers, can also cause Kidney problems.   3)You will get a call from Hospice Service to discuss options  4)You have been scheduled for a PET Scan on 01/06/21 to see how far your Colon cancer has spread -You have colon cancer confirmed by colonoscopy with Biopsy and Pathology Tests-- You can decide if you want to proceed with this test---Please let Dr Delton Coombes know if you decide Not to do this PET scan Test - hematologist/oncologist Dr. Derek Jack, MD if you so desire -Address: inside [facility] (4th Floor), [place], [place], [place] [address] Phone: [phone]  5)Repeat CBC and BMP Test on Monday 01/03/2021 if you decide Not to [facility]

## 2020-12-30 NOTE — Progress Notes (Signed)
° °  °  SATURATION QUALIFICATIONS: (This note is used to comply with regulatory documentation for home oxygen)   Patient Saturations on Room Air at Rest =  87 %   Patient Saturations on Room Air while moving around in bed = 85 %   Patient Saturations on 3 Liters of oxygen while moving around in Bed  = 93 %     Patient needs continuous O2 at 3 L/min continuously via nasal cannula with humidifier, with gaseous portability and conserving device    Diagnosis ---Diastolic Dysfunction Congestive Heart Failure/HFpEF

## 2021-01-03 ENCOUNTER — Encounter (INDEPENDENT_AMBULATORY_CARE_PROVIDER_SITE_OTHER): Payer: Self-pay | Admitting: *Deleted

## 2021-01-04 ENCOUNTER — Other Ambulatory Visit (HOSPITAL_COMMUNITY): Payer: Self-pay | Admitting: *Deleted

## 2021-01-04 DIAGNOSIS — C189 Malignant neoplasm of colon, unspecified: Secondary | ICD-10-CM

## 2021-01-06 ENCOUNTER — Ambulatory Visit (HOSPITAL_COMMUNITY): Payer: Medicare Other

## 2021-01-13 ENCOUNTER — Ambulatory Visit (HOSPITAL_COMMUNITY): Payer: Medicare Other | Admitting: Hematology

## 2021-01-16 DEATH — deceased

## 2021-01-20 ENCOUNTER — Ambulatory Visit (HOSPITAL_COMMUNITY): Payer: Medicare Other | Admitting: Hematology

## 2021-01-31 ENCOUNTER — Other Ambulatory Visit (INDEPENDENT_AMBULATORY_CARE_PROVIDER_SITE_OTHER): Payer: Self-pay | Admitting: Gastroenterology

## 2021-01-31 DIAGNOSIS — K922 Gastrointestinal hemorrhage, unspecified: Secondary | ICD-10-CM

## 2021-02-17 ENCOUNTER — Ambulatory Visit (INDEPENDENT_AMBULATORY_CARE_PROVIDER_SITE_OTHER): Payer: Medicare Other | Admitting: Gastroenterology

## 2023-02-27 IMAGING — DX DG CHEST 2V
2 series · 2 of 2 positions shown · non-contrast
Comparison: 07/21/2020

CLINICAL DATA: Shortness of breath and coughing

EXAM:
CHEST - 2 VIEW

[chest lat]
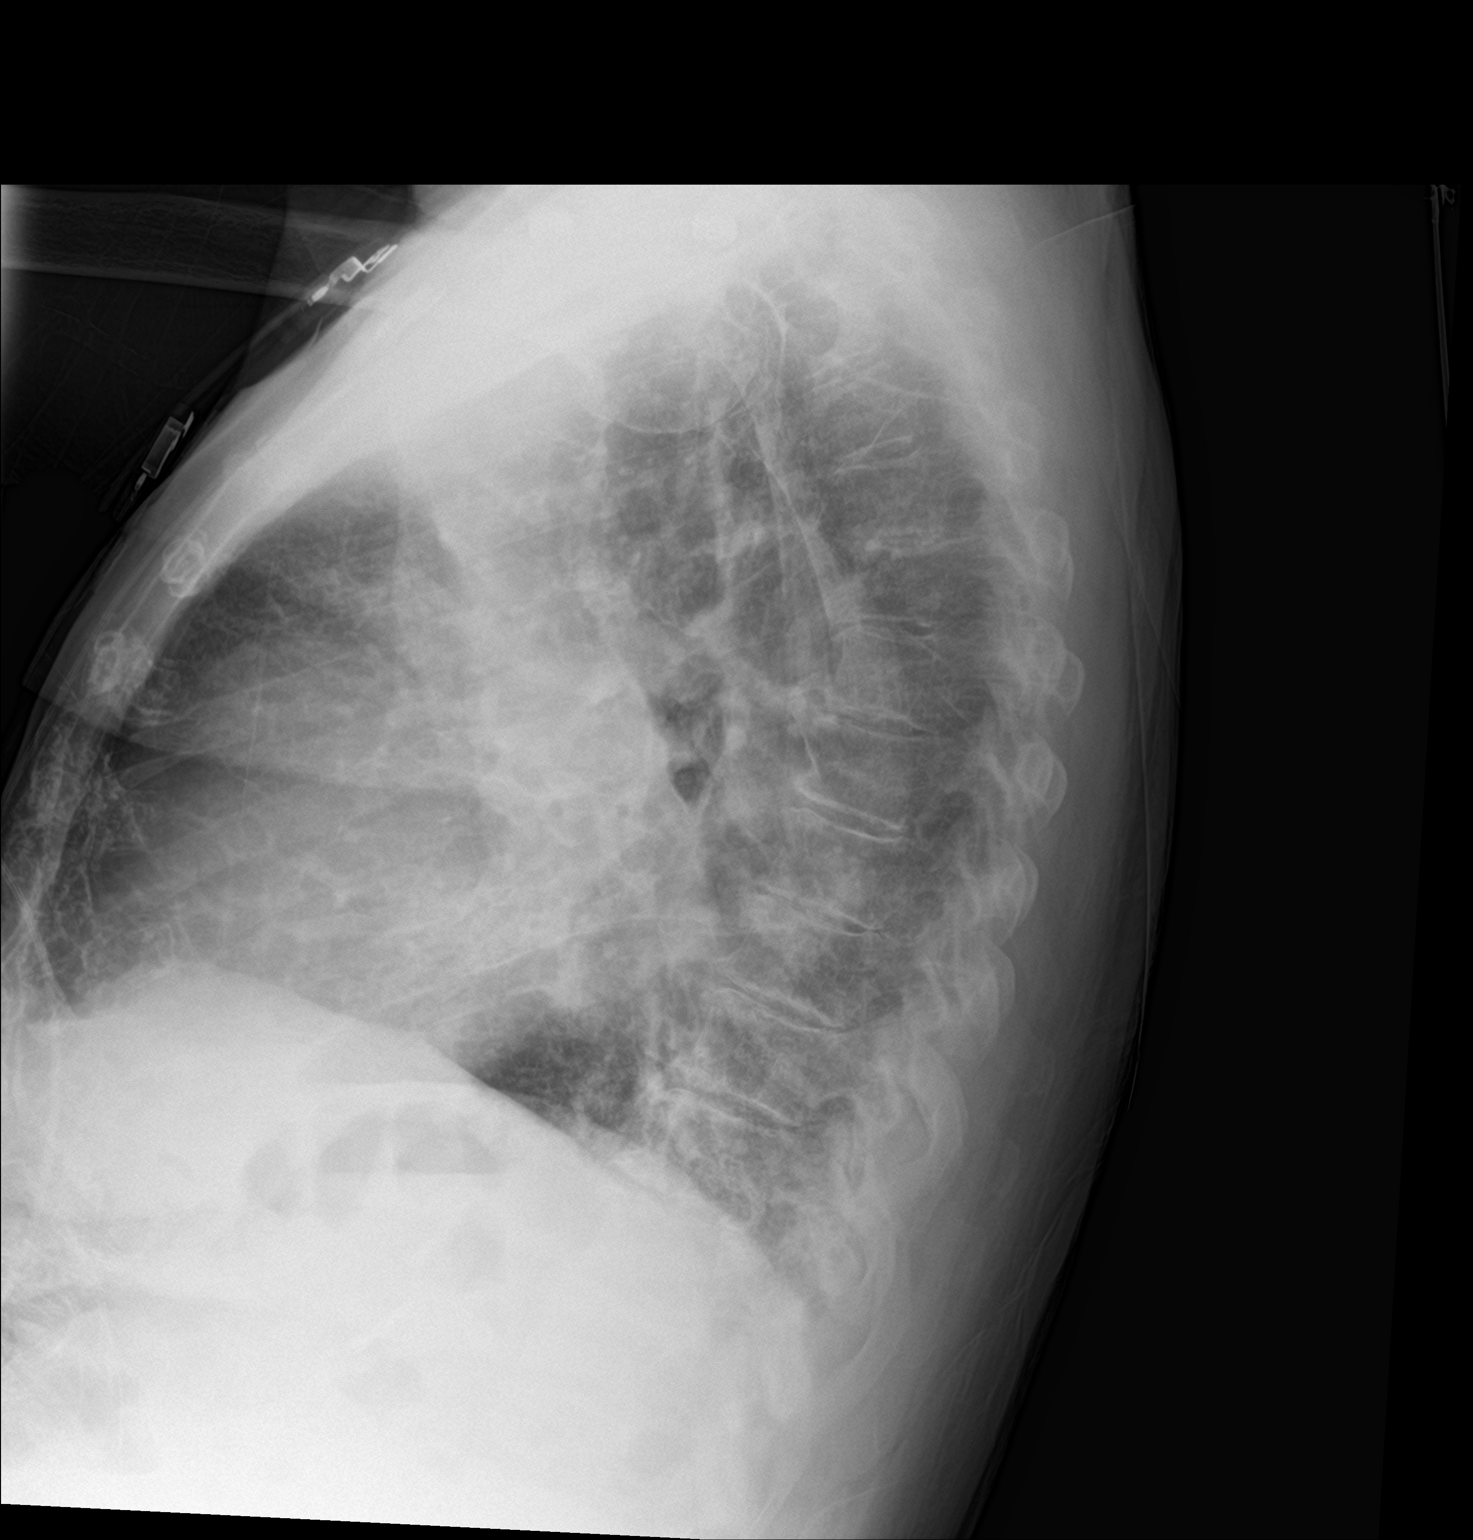

[chest ap]
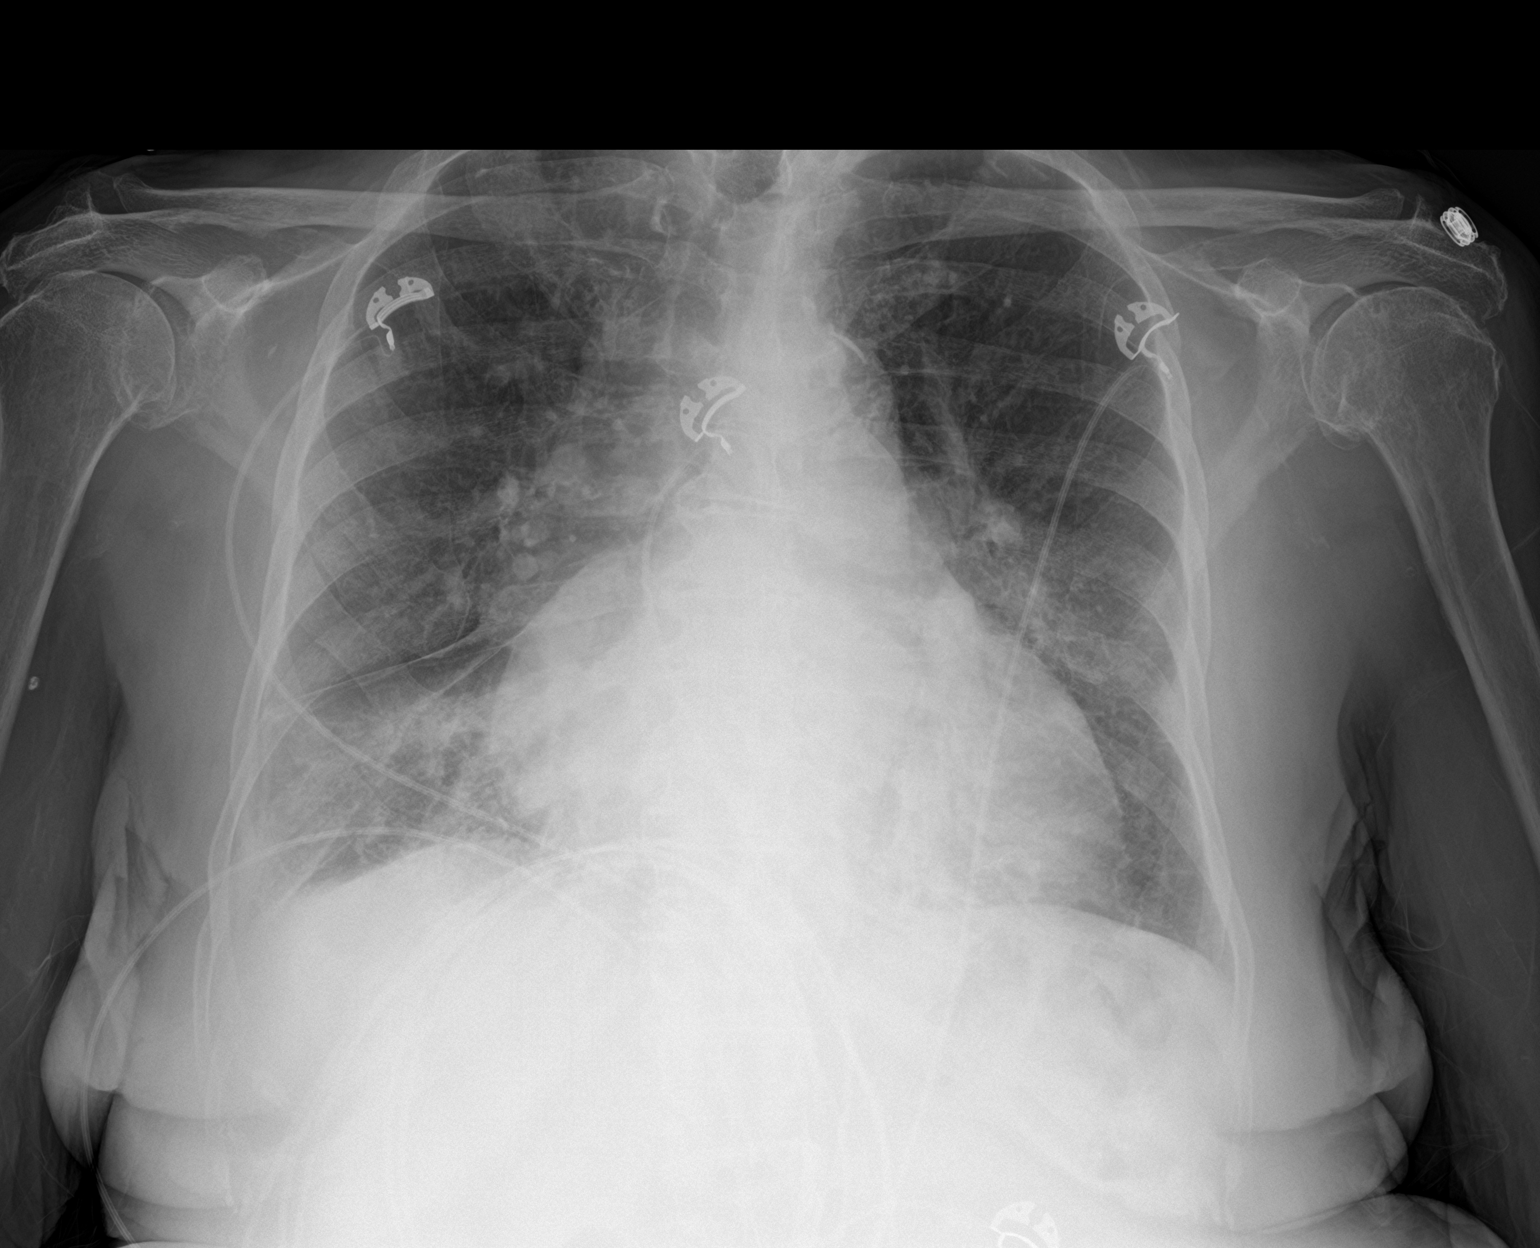

[2 of 2 positions shown; findings below may reference images not displayed]

FINDINGS: Patchy bilateral airspace opacity, again favoring pneumonia. Stable
cardiomegaly. No visible effusion or pneumothorax.
IMPRESSION: Unchanged bilateral pulmonary infiltrates.

## 2023-03-10 IMAGING — US US ABDOMEN COMPLETE W/ ELASTOGRAPHY
1 series · 12 of 20 positions shown · non-contrast
Comparison: CT abdomen 05/03/2019

CLINICAL DATA: Abnormal liver function tests.

EXAM:
ULTRASOUND ABDOMEN
ULTRASOUND HEPATIC ELASTOGRAPHY
TECHNIQUE: Sonography of the upper abdomen was performed. In addition,
ultrasound elastography evaluation of the liver was performed. A
region of interest was placed within the right lobe of the liver.
Following application of a compressive sonographic pulse, tissue
compressibility was assessed. Multiple assessments were performed at
the selected site. Median tissue compressibility was determined.
Previously, hepatic stiffness was assessed by shear wave velocity.
Based on recently published Society of Radiologists in Ultrasound
consensus article, reporting is now recommended to be performed in
the SI units of pressure (kiloPascals) representing hepatic
stiffness/elasticity. The obtained result is compared to the
published reference standards. (cACLD = compensated Advanced Chronic
Liver Disease)

[Series 1: abdomen us · 12 of 20 slices shown]
[im 1/20]
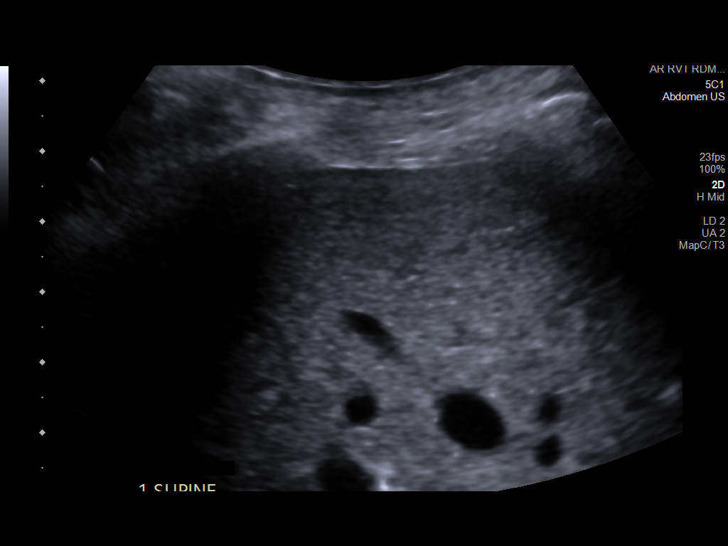
[im 3/20]
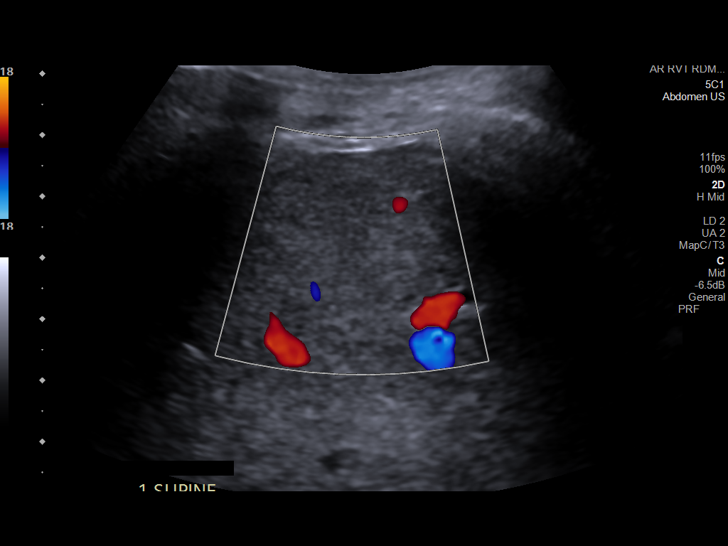
[im 5/20]
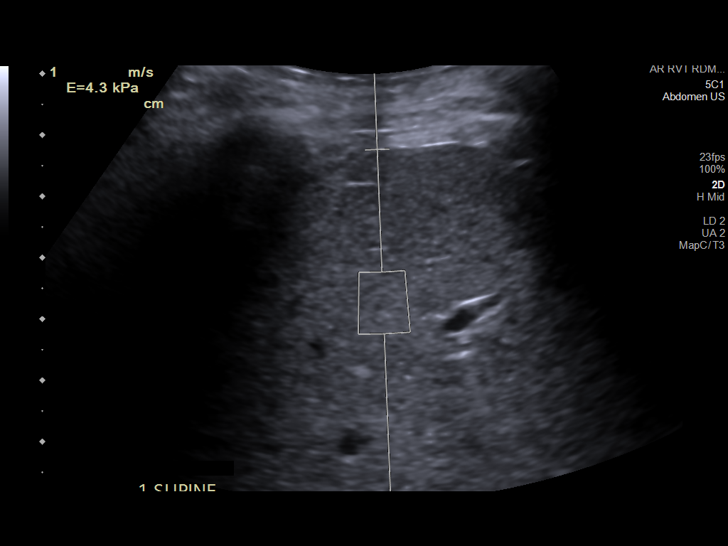
[im 6/20]
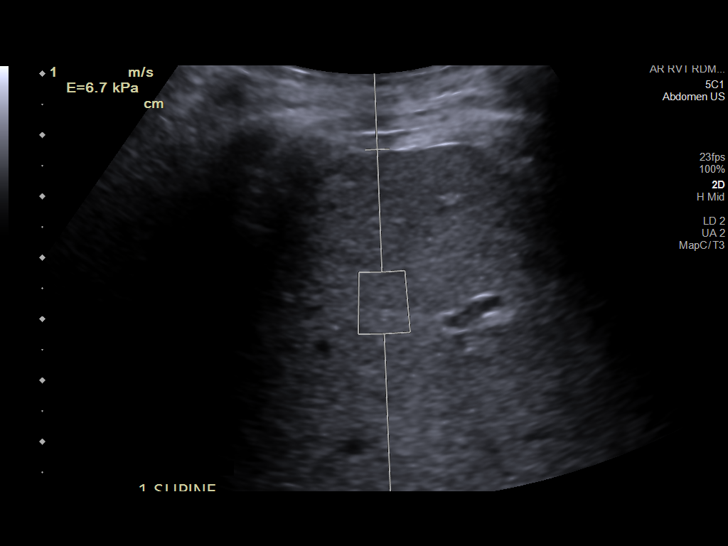
[im 8/20]
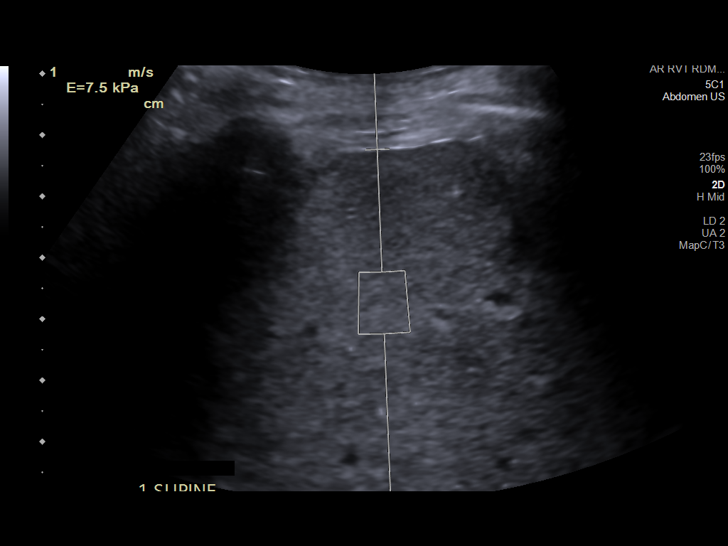
[im 10/20]
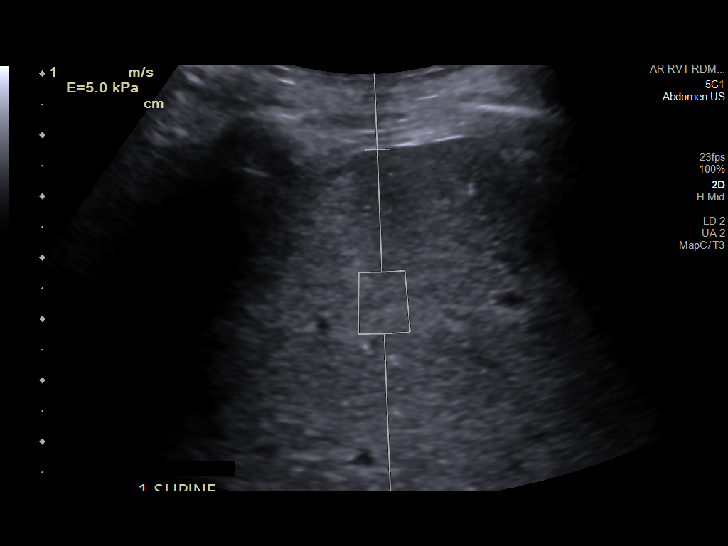
[im 11/20]
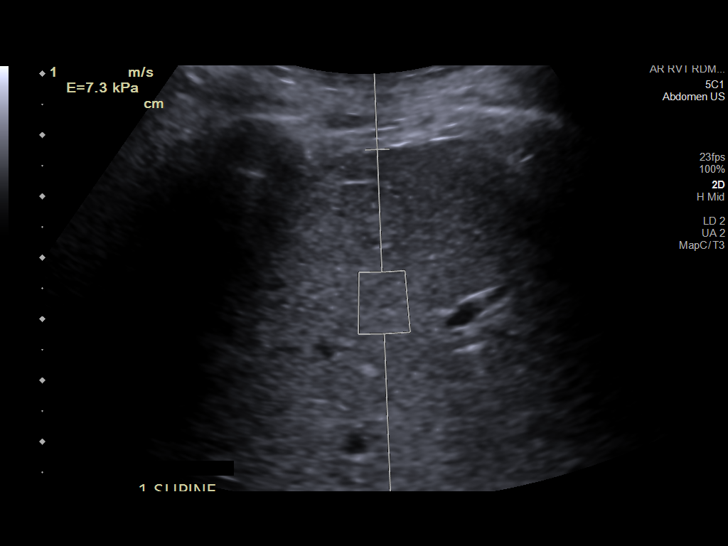
[im 13/20]
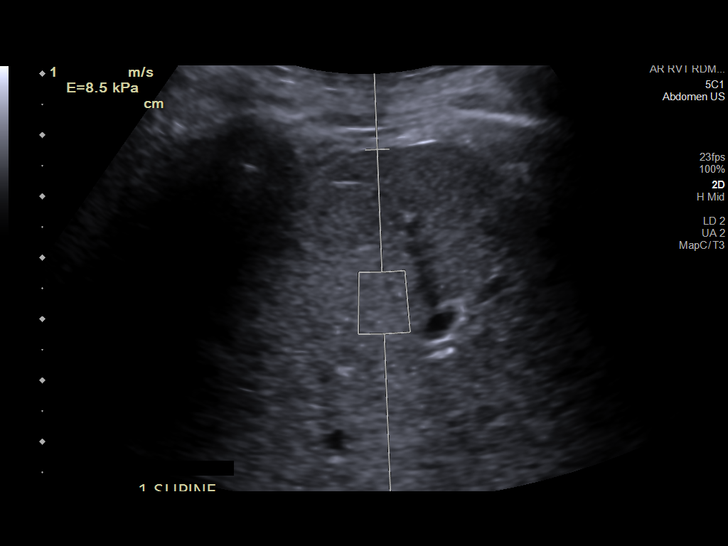
[im 15/20]
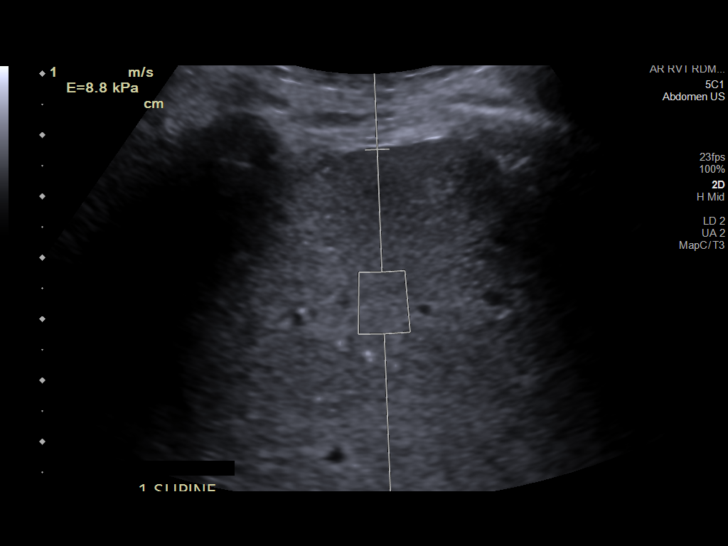
[im 16/20]
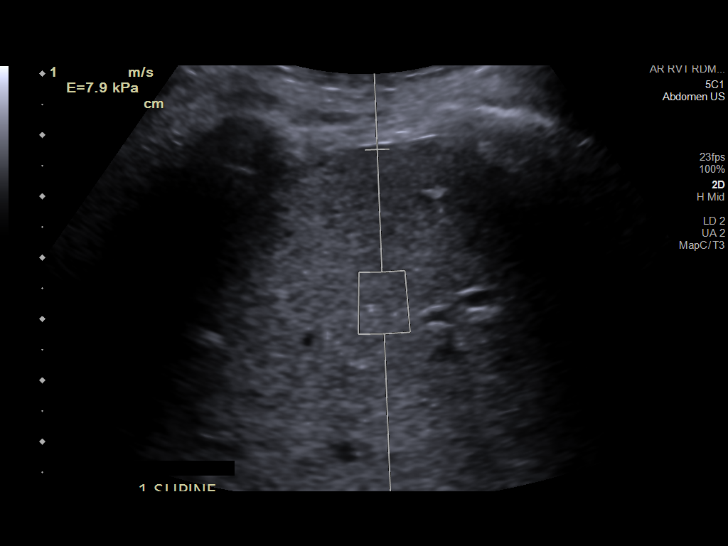
[im 18/20]
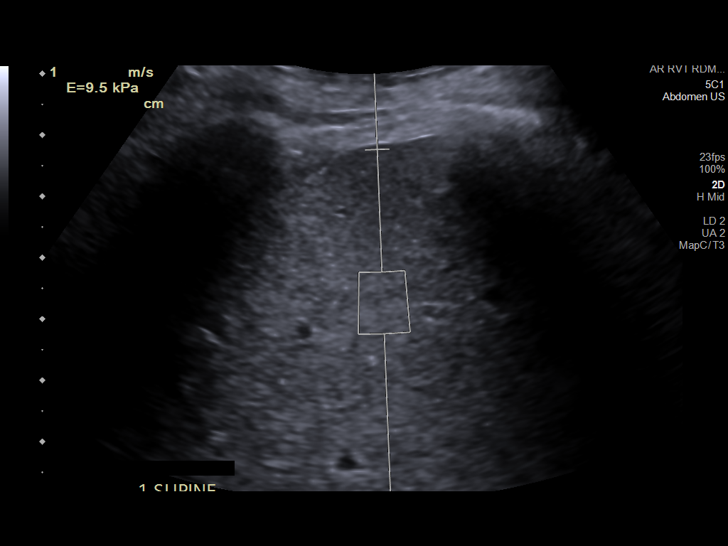
[im 20/20]
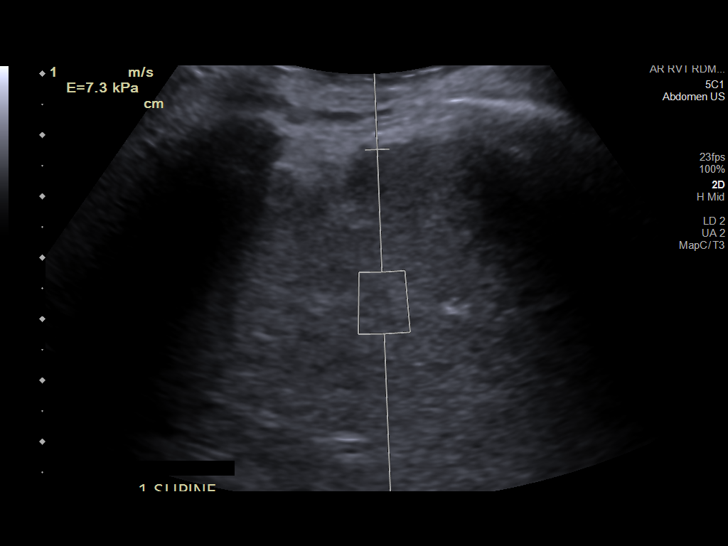

[12 of 20 positions shown; findings below may reference images not displayed]

FINDINGS: ULTRASOUND ABDOMEN

Gallbladder: Gallstones measuring up to 1.2 cm in diameter.
Sonographic Murphy's sign absent. No significant gallbladder wall
thickening.

Common bile duct: Diameter: 0.3 cm

Liver: Faintly accentuated echogenicity in the liver without
definite nodular contours. No focal hepatic lesion identified.
Portal vein is patent on color Doppler imaging with normal direction
of blood flow towards the liver.

IVC: No abnormality visualized.

Pancreas: Poor visualization of the pancreatic head and uncinate
process, questionable heterogeneity in this region.

Spleen: Size and appearance within normal limits.

Right Kidney: Length: 9.8 cm. Echogenicity within normal limits. No
solid mass or hydronephrosis visualized. Right renal cysts include a
2.0 by 1.8 by 2.0 cm cyst and a 2.0 by 1.7 by 1.9 cm lower pole
cyst, both appear simple.

Left Kidney: Length: 8.5 cm. Echogenicity within normal limits. No
solid mass or hydronephrosis visualized. Left renal cysts include a
1.0 by 1.1 by 0.9 cm cyst and a separate 1.0 by 0.7 by 0.8 cm cyst,
both with reasonably simple characteristics.

Abdominal aorta: No aneurysm visualized.

Other findings: None.

ULTRASOUND HEPATIC ELASTOGRAPHY

Device: Siemens Helix VTQ

Patient position: Supine

Transducer 5C1

Number of measurements: 10

Hepatic segment:  8

Median kPa:

IQR:

IQR/Median kPa ratio:

Data quality:  Good

Diagnostic category: < or = 9 kPa: in the absence of other known
clinical signs, rules out cACLD

The use of hepatic elastography is applicable to patients with viral
hepatitis and non-alcoholic fatty liver disease. At this time, there
is insufficient data for the referenced cut-off values and use in
other causes of liver disease, including alcoholic liver disease.
Patients, however, may be assessed by elastography and serve as
their own reference standard/baseline.

In patients with non-alcoholic liver disease, the values suggesting
compensated advanced chronic liver disease (cACLD) may be lower, and
patients may need additional testing with elasticity results of [DATE]
kPa.

Please note that abnormal hepatic elasticity and shear wave
velocities may also be identified in clinical settings other than
with hepatic fibrosis, such as: acute hepatitis, elevated right
heart and central venous pressures including use of beta blockers,
Mereskina disease ([place]), infiltrative processes such as
mastocytosis/amyloidosis/infiltrative tumor/lymphoma, extrahepatic
cholestasis, with hyperemia in the post-prandial state, and with
liver transplantation. Correlation with patient history, laboratory
data, and clinical condition recommended.

Diagnostic Categories:

< or =5 kPa: high probability of being normal

< or =9 kPa: in the absence of other known clinical signs, rules [DATE] kPa and ?13 kPa: suggestive of cACLD, but needs further testing

>13 kPa: highly suggestive of cACLD

> or =17 kPa: highly suggestive of cACLD with an increased
probability of clinically significant portal hypertension
IMPRESSION: ULTRASOUND ABDOMEN:

1. Cholelithiasis without gallbladder wall thickening or sonographic
Murphy's sign.
2. Bilateral renal cysts.
3. Poor visualization of the pancreatic head and uncinate process
with questionable heterogeneity in this region. Consider pancreatic
protocol CT or MRI for further characterization.

ULTRASOUND HEPATIC ELASTOGRAPHY:

Median kPa:

Diagnostic category: < or = 9 kPa: in the absence of other known
clinical signs, rules out cACLD

## 2023-08-03 IMAGING — US US EXTREM  UP VENOUS*R*
1 series · 13 of 24 positions shown · non-contrast
Comparison: None.

CLINICAL DATA: Right upper extremity swelling



[Series 1: us venous img upper uni right (dvt) · portal-venous · 13 of 51 slices shown]
[im 1/51]
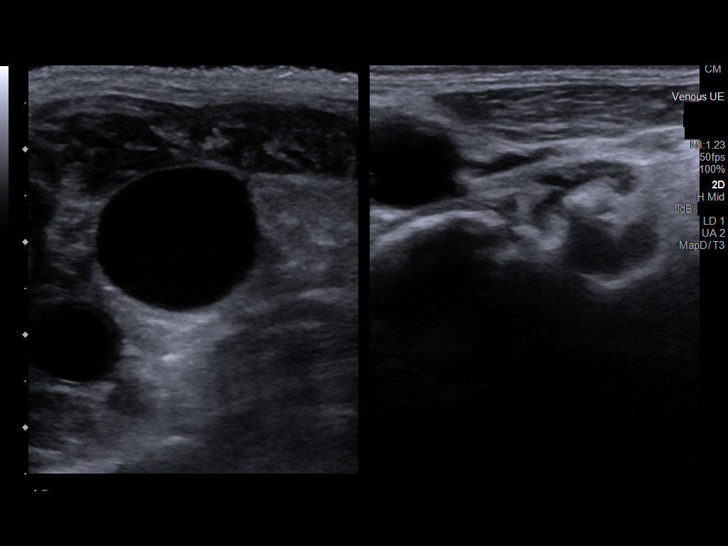
[im 5/51]
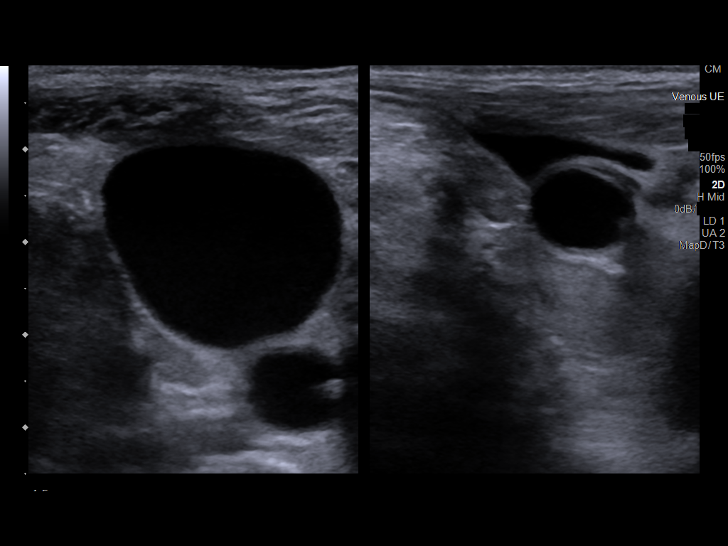
[im 9/51]
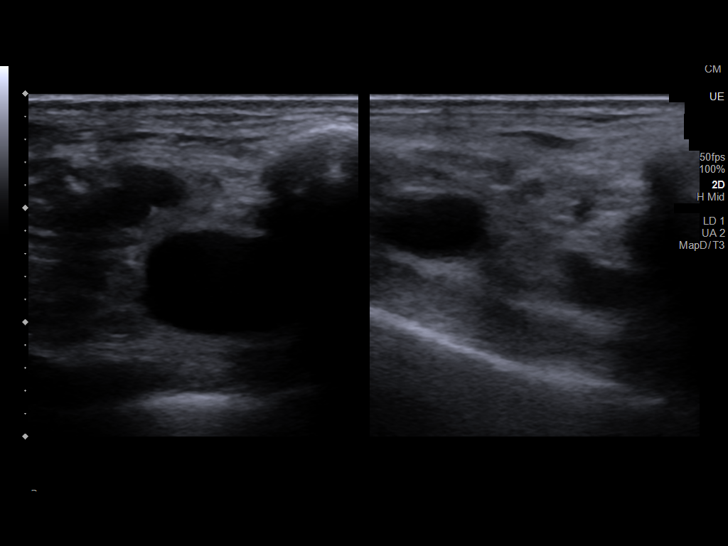
[im 14/51]
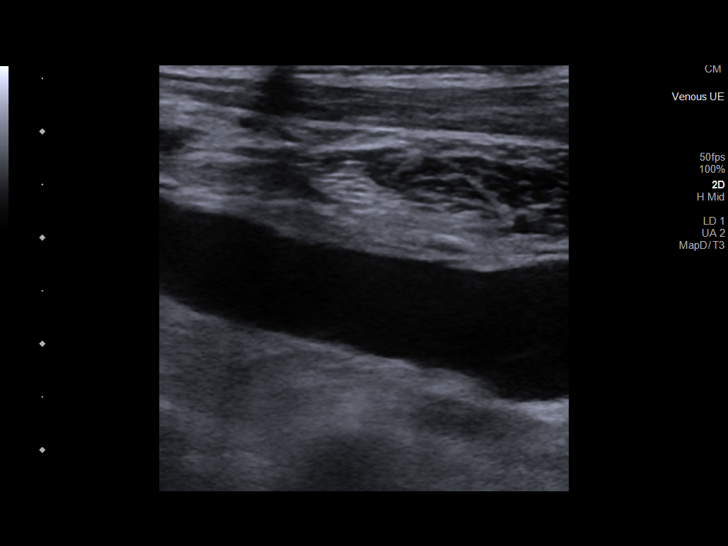
[im 18/51]
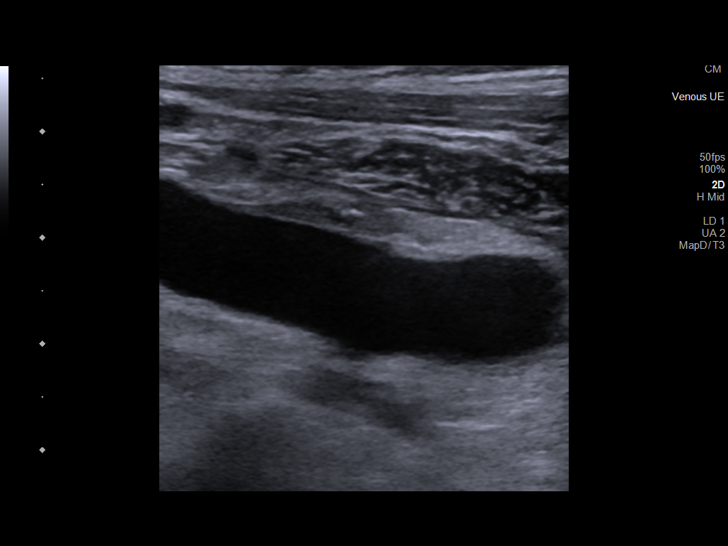
[im 22/51]
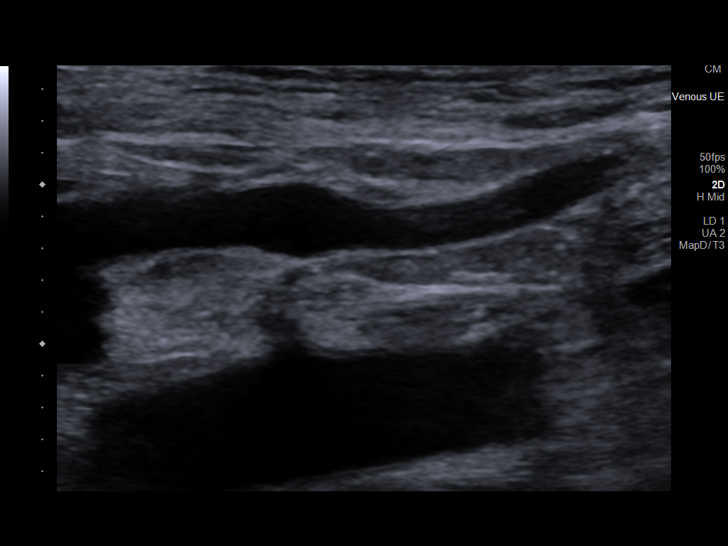
[im 27/51]
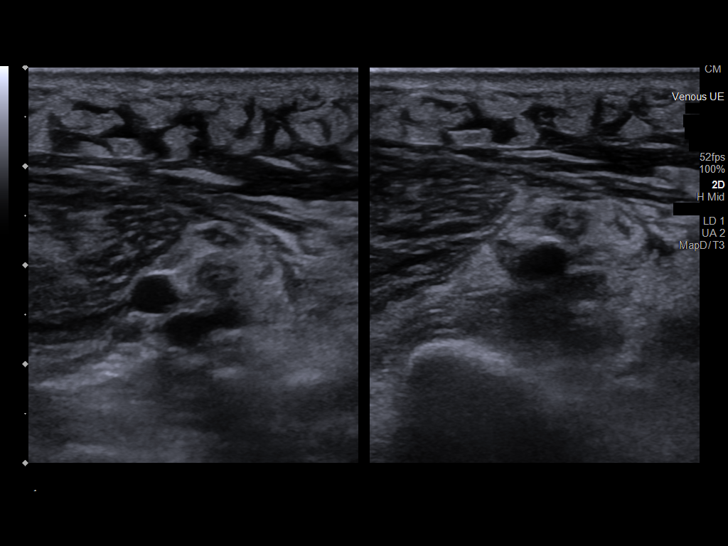
[im 29/51]
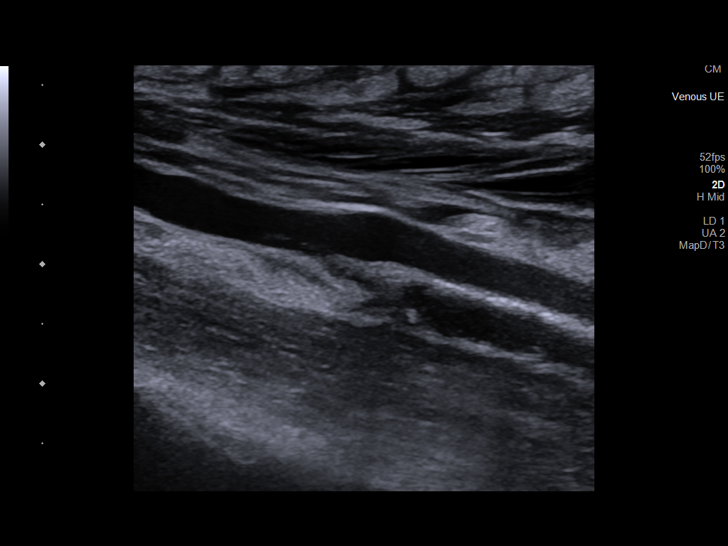
[im 33/51]
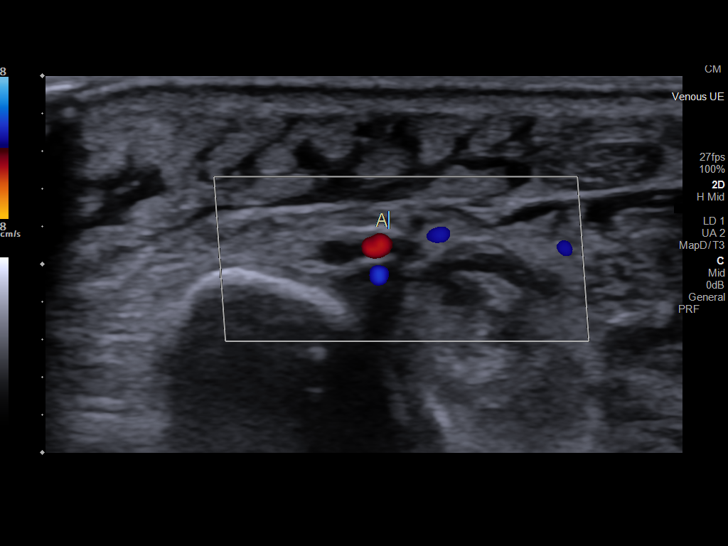
[im 37/51]
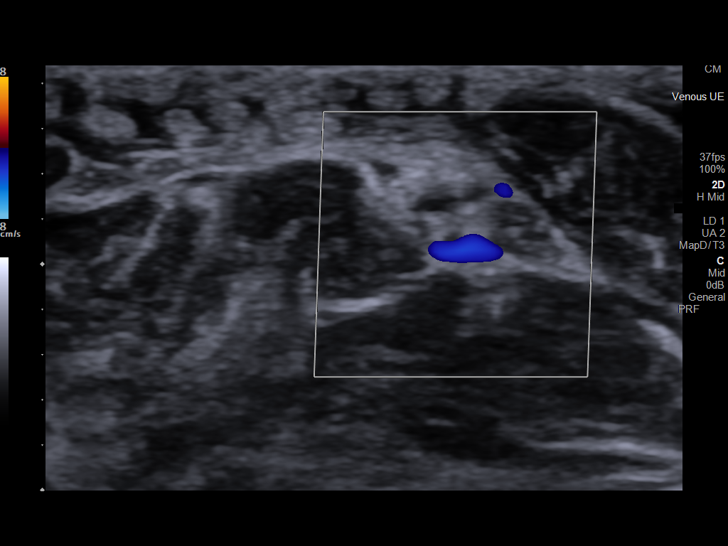
[im 42/51]
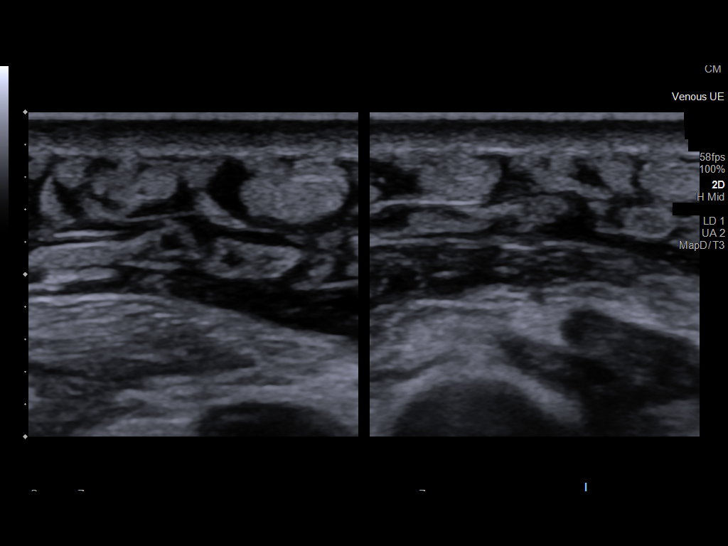
[im 46/51]
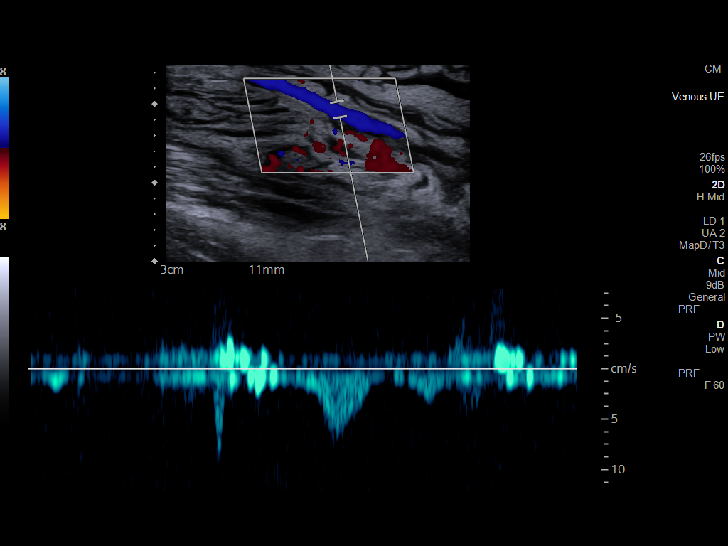
[im 51/51]
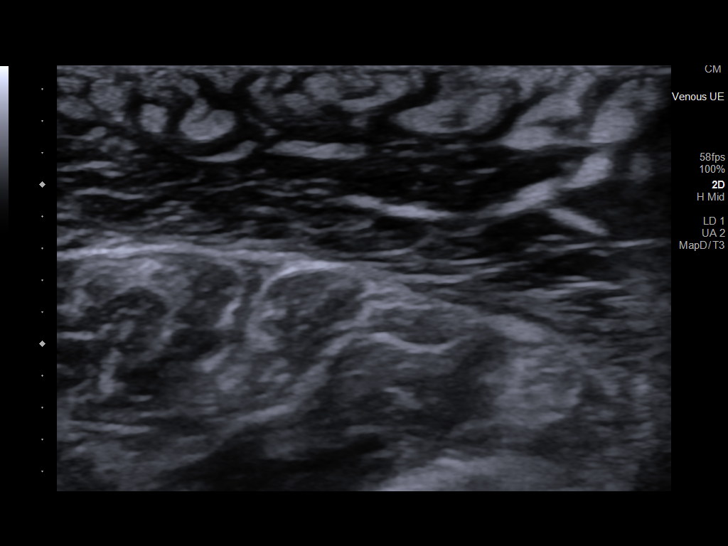

[13 of 24 positions shown; findings below may reference images not displayed]

FINDINGS: Contralateral Subclavian Vein: Respiratory phasicity is normal and
symmetric with the symptomatic side. No evidence of thrombus. Normal
compressibility.

Internal Jugular Vein: No evidence of thrombus. Normal
compressibility, respiratory phasicity and response to augmentation.

Subclavian Vein: No evidence of thrombus. Normal compressibility,
respiratory phasicity and response to augmentation.

Axillary Vein: No evidence of thrombus. Normal compressibility,
respiratory phasicity and response to augmentation.

Cephalic Vein: No evidence of thrombus. Normal compressibility,
respiratory phasicity and response to augmentation.

Basilic Vein: No evidence of thrombus. Normal compressibility,
respiratory phasicity and response to augmentation.

Brachial Veins: No evidence of thrombus. Normal compressibility,
respiratory phasicity and response to augmentation.

Radial Veins: No evidence of thrombus. Normal compressibility,
respiratory phasicity and response to augmentation.

Ulnar Veins: No evidence of thrombus. Normal compressibility,
respiratory phasicity and response to augmentation.

Venous Reflux:  None visualized.

Other Findings: Ill-defined hypoechoic fluid/edema noted throughout
the superficial fat and soft tissues.
IMPRESSION: No evidence of DVT within the right upper extremity.

Extensive superficial edema versus cellulitis.
